# Patient Record
Sex: Female | Born: 1965 | Race: Black or African American | Hispanic: No | Marital: Single | Smoking: Never smoker
Health system: Southern US, Community
[De-identification: ages and names within clinical notes are randomized; demographics above are authoritative.]

## PROBLEM LIST (undated history)

## (undated) DIAGNOSIS — R569 Unspecified convulsions: Secondary | ICD-10-CM

## (undated) DIAGNOSIS — E119 Type 2 diabetes mellitus without complications: Secondary | ICD-10-CM

## (undated) DIAGNOSIS — D849 Immunodeficiency, unspecified: Secondary | ICD-10-CM

## (undated) DIAGNOSIS — I10 Essential (primary) hypertension: Secondary | ICD-10-CM

## (undated) DIAGNOSIS — F70 Mild intellectual disabilities: Secondary | ICD-10-CM

## (undated) HISTORY — DX: Unspecified convulsions: R56.9

---

## 2004-04-25 ENCOUNTER — Emergency Department (HOSPITAL_COMMUNITY): Admission: EM | Admit: 2004-04-25 | Discharge: 2004-04-25 | Payer: Self-pay | Admitting: Emergency Medicine

## 2005-01-10 ENCOUNTER — Ambulatory Visit: Payer: Self-pay | Admitting: Psychiatry

## 2005-01-10 ENCOUNTER — Inpatient Hospital Stay (HOSPITAL_COMMUNITY): Admission: AD | Admit: 2005-01-10 | Discharge: 2005-01-17 | Payer: Self-pay | Admitting: Psychiatry

## 2005-03-09 ENCOUNTER — Ambulatory Visit (HOSPITAL_COMMUNITY): Admission: RE | Admit: 2005-03-09 | Discharge: 2005-03-09 | Payer: Self-pay | Admitting: Internal Medicine

## 2005-05-15 ENCOUNTER — Emergency Department (HOSPITAL_COMMUNITY): Admission: EM | Admit: 2005-05-15 | Discharge: 2005-05-16 | Payer: Self-pay | Admitting: Emergency Medicine

## 2005-08-14 ENCOUNTER — Emergency Department (HOSPITAL_COMMUNITY): Admission: EM | Admit: 2005-08-14 | Discharge: 2005-08-15 | Payer: Self-pay | Admitting: Emergency Medicine

## 2005-08-21 ENCOUNTER — Emergency Department (HOSPITAL_COMMUNITY): Admission: EM | Admit: 2005-08-21 | Discharge: 2005-08-21 | Payer: Self-pay | Admitting: Emergency Medicine

## 2005-08-22 ENCOUNTER — Emergency Department (HOSPITAL_COMMUNITY): Admission: EM | Admit: 2005-08-22 | Discharge: 2005-08-22 | Payer: Self-pay | Admitting: Emergency Medicine

## 2005-08-23 ENCOUNTER — Emergency Department (HOSPITAL_COMMUNITY): Admission: EM | Admit: 2005-08-23 | Discharge: 2005-08-23 | Payer: Self-pay | Admitting: Emergency Medicine

## 2005-08-25 ENCOUNTER — Other Ambulatory Visit: Admission: RE | Admit: 2005-08-25 | Discharge: 2005-08-25 | Payer: Self-pay | Admitting: Obstetrics and Gynecology

## 2005-11-09 ENCOUNTER — Encounter (INDEPENDENT_AMBULATORY_CARE_PROVIDER_SITE_OTHER): Payer: Self-pay | Admitting: Specialist

## 2005-11-09 ENCOUNTER — Inpatient Hospital Stay (HOSPITAL_COMMUNITY): Admission: AD | Admit: 2005-11-09 | Discharge: 2005-11-11 | Payer: Self-pay | Admitting: Obstetrics and Gynecology

## 2006-04-12 ENCOUNTER — Ambulatory Visit (HOSPITAL_COMMUNITY): Admission: RE | Admit: 2006-04-12 | Discharge: 2006-04-12 | Payer: Self-pay | Admitting: Internal Medicine

## 2007-03-29 ENCOUNTER — Emergency Department (HOSPITAL_COMMUNITY): Admission: EM | Admit: 2007-03-29 | Discharge: 2007-03-29 | Payer: Self-pay | Admitting: Emergency Medicine

## 2007-04-15 ENCOUNTER — Ambulatory Visit (HOSPITAL_COMMUNITY): Admission: RE | Admit: 2007-04-15 | Discharge: 2007-04-15 | Payer: Self-pay | Admitting: Internal Medicine

## 2007-10-15 ENCOUNTER — Observation Stay (HOSPITAL_COMMUNITY): Admission: AD | Admit: 2007-10-15 | Discharge: 2007-10-17 | Payer: Self-pay | Admitting: Internal Medicine

## 2007-12-17 ENCOUNTER — Encounter: Admission: RE | Admit: 2007-12-17 | Discharge: 2007-12-17 | Payer: Self-pay | Admitting: Internal Medicine

## 2008-03-05 ENCOUNTER — Other Ambulatory Visit: Payer: Self-pay

## 2008-03-05 ENCOUNTER — Other Ambulatory Visit: Payer: Self-pay | Admitting: Emergency Medicine

## 2008-03-05 ENCOUNTER — Inpatient Hospital Stay (HOSPITAL_COMMUNITY): Admission: RE | Admit: 2008-03-05 | Discharge: 2008-03-09 | Payer: Self-pay | Admitting: Psychiatry

## 2008-03-05 ENCOUNTER — Ambulatory Visit: Payer: Self-pay | Admitting: Psychiatry

## 2008-03-31 ENCOUNTER — Encounter: Admission: RE | Admit: 2008-03-31 | Discharge: 2008-03-31 | Payer: Self-pay | Admitting: Internal Medicine

## 2009-03-10 ENCOUNTER — Encounter: Admission: RE | Admit: 2009-03-10 | Discharge: 2009-03-10 | Payer: Self-pay | Admitting: General Surgery

## 2009-03-25 ENCOUNTER — Encounter (INDEPENDENT_AMBULATORY_CARE_PROVIDER_SITE_OTHER): Payer: Self-pay | Admitting: General Surgery

## 2009-03-25 ENCOUNTER — Ambulatory Visit (HOSPITAL_COMMUNITY): Admission: RE | Admit: 2009-03-25 | Discharge: 2009-03-26 | Payer: Self-pay | Admitting: General Surgery

## 2009-05-18 ENCOUNTER — Emergency Department (HOSPITAL_COMMUNITY): Admission: EM | Admit: 2009-05-18 | Discharge: 2009-05-18 | Payer: Self-pay | Admitting: Emergency Medicine

## 2009-05-31 ENCOUNTER — Ambulatory Visit (HOSPITAL_COMMUNITY): Admission: RE | Admit: 2009-05-31 | Discharge: 2009-05-31 | Payer: Self-pay | Admitting: Internal Medicine

## 2009-06-10 ENCOUNTER — Encounter: Admission: RE | Admit: 2009-06-10 | Discharge: 2009-06-10 | Payer: Self-pay | Admitting: Internal Medicine

## 2009-12-27 ENCOUNTER — Encounter: Admission: RE | Admit: 2009-12-27 | Discharge: 2009-12-27 | Payer: Self-pay | Admitting: Internal Medicine

## 2010-04-27 ENCOUNTER — Emergency Department (HOSPITAL_COMMUNITY): Admission: EM | Admit: 2010-04-27 | Discharge: 2010-04-27 | Payer: Self-pay | Admitting: Emergency Medicine

## 2010-06-08 ENCOUNTER — Ambulatory Visit (HOSPITAL_COMMUNITY)
Admission: RE | Admit: 2010-06-08 | Discharge: 2010-06-08 | Payer: Self-pay | Source: Home / Self Care | Attending: Internal Medicine | Admitting: Internal Medicine

## 2010-08-30 LAB — DIFFERENTIAL
Lymphocytes Relative: 16 % (ref 12–46)
Monocytes Absolute: 0.5 10*3/uL (ref 0.1–1.0)
Monocytes Relative: 5 % (ref 3–12)
Neutro Abs: 7.4 10*3/uL (ref 1.7–7.7)

## 2010-08-30 LAB — URINE MICROSCOPIC-ADD ON

## 2010-08-30 LAB — URINALYSIS, ROUTINE W REFLEX MICROSCOPIC
Bilirubin Urine: NEGATIVE
Glucose, UA: NEGATIVE mg/dL
Ketones, ur: NEGATIVE mg/dL
pH: 6 (ref 5.0–8.0)

## 2010-08-30 LAB — CBC
HCT: 35.6 % — ABNORMAL LOW (ref 36.0–46.0)
Hemoglobin: 12 g/dL (ref 12.0–15.0)
RBC: 3.71 MIL/uL — ABNORMAL LOW (ref 3.87–5.11)
WBC: 9.5 10*3/uL (ref 4.0–10.5)

## 2010-08-30 LAB — POCT I-STAT, CHEM 8
BUN: 9 mg/dL (ref 6–23)
Calcium, Ion: 1.17 mmol/L (ref 1.12–1.32)
Chloride: 105 mEq/L (ref 96–112)
Glucose, Bld: 86 mg/dL (ref 70–99)

## 2010-08-30 LAB — PREGNANCY, URINE: Preg Test, Ur: NEGATIVE

## 2010-09-22 LAB — CBC
HCT: 36 % (ref 36.0–46.0)
Hemoglobin: 12.4 g/dL (ref 12.0–15.0)
MCV: 97.3 fL (ref 78.0–100.0)
Platelets: 216 10*3/uL (ref 150–400)
RDW: 12.6 % (ref 11.5–15.5)
WBC: 8.2 10*3/uL (ref 4.0–10.5)

## 2010-09-22 LAB — COMPREHENSIVE METABOLIC PANEL
Albumin: 3.5 g/dL (ref 3.5–5.2)
BUN: 9 mg/dL (ref 6–23)
Chloride: 104 mEq/L (ref 96–112)
Creatinine, Ser: 0.93 mg/dL (ref 0.4–1.2)
Glucose, Bld: 169 mg/dL — ABNORMAL HIGH (ref 70–99)
Total Bilirubin: 0.5 mg/dL (ref 0.3–1.2)

## 2010-09-22 LAB — GLUCOSE, CAPILLARY
Glucose-Capillary: 106 mg/dL — ABNORMAL HIGH (ref 70–99)
Glucose-Capillary: 136 mg/dL — ABNORMAL HIGH (ref 70–99)
Glucose-Capillary: 156 mg/dL — ABNORMAL HIGH (ref 70–99)
Glucose-Capillary: 97 mg/dL (ref 70–99)

## 2010-09-22 LAB — DIFFERENTIAL
Basophils Relative: 1 % (ref 0–1)
Lymphocytes Relative: 20 % (ref 12–46)
Monocytes Absolute: 0.7 10*3/uL (ref 0.1–1.0)
Neutro Abs: 5.8 10*3/uL (ref 1.7–7.7)
Neutrophils Relative %: 71 % (ref 43–77)

## 2010-11-01 NOTE — H&P (Signed)
NAMEARELLY, WHITTENBERG               ACCOUNT NO.:  [id number]   MEDICAL RECORD NO.:  [id number]          PATIENT TYPE:  INP   LOCATION:  1517                         FACILITY:  [facility]   PHYSICIAN:  Madaline Savage, MD        DATE OF BIRTH:  August 19, 1965   DATE OF ADMISSION:  10/15/2007  DATE OF DISCHARGE:                              HISTORY & PHYSICAL   PRIMARY CARE PHYSICIAN:  Dr. Della Goo.   CHIEF COMPLAINT:  Hyperglycemia.   HISTORY OF PRESENT ILLNESS:  Ms. Solberg is a 45 year old lady with a  history of hypertension, bipolar disorder and depression who was  transferred from her primary care physician's office where she had a  blood sugar of 550.  She was not diagnosed with diabetes mellitus  before, but she states she has had polyuria and polydipsia for the last  few weeks.  She also complains of dysuria now.  She denies any fever or  chills or any other complaints.   PAST MEDICAL HISTORY:  1. Hypertension.  2. Bipolar disorder.  3. Depression.  4. History of genital herpes.   PAST SURGICAL HISTORY:  1. Tubal ligation.  2. She had a laparotomy with cyst removal and myomectomy.   ALLERGIES:  SHE IS ALLERGIC TO PENICILLIN AND SULFA.   CURRENT MEDICATIONS:  1. She is on Risperdal 0.5 mg daily.  2. Sertraline 50 mg daily.  3. Trazodone 50 mg at bedtime.  4. Valtrex 500 mg p.o. daily.   SOCIAL HISTORY:  She lives in a group home.  She has been there for the  last 2 years.  She denies any history of smoking.  She drinks alcohol  occasionally.  Denies any history of drug abuse.   FAMILY HISTORY:  Her father passed away at the age of 73.  He had some  kind of cancer which she is not sure about.  He was a diabetic.  She  does not know the medical history of her mother.   REVIEW OF SYSTEMS:  GENERAL:  She denies any recent weight loss or  weight gain.  No fever or chills.  HEENT: No headaches, no blurred  vision or sore throat.  CARDIOVASCULAR SYSTEM:  Denies chest pain,  no  cough.  No abdominal pain, nausea, vomiting, diarrhea, or constipation.  GENITOURINARY SYSTEM:  She does complain of dysuria and no increased  frequency of urination but she does also complain of polyuria.  EXTREMITIES:  No tingling or numbness of toes.   PHYSICAL EXAM:  She is alert and oriented x3.  VITAL SIGNS:  Temperature is 98.4, pulse rate of 74, blood pressure  105/78.  Respiratory rate 18, oxygen saturation 99% room air.  HEENT:  Head atraumatic, normocephalic.  Pupils bilaterally equal, round  and reactive to light.  Mucous membranes are moist.  NECK:  Supple.  No JVD, no carotid bruit.  CARDIOVASCULAR:  S1 and S2.  Regular rate and rhythm.  CHEST:  Clear to auscultation.  ABDOMEN:  Soft.  Bowel sounds heard.  EXTREMITIES:  No edema, cyanosis or clubbing.   LABS:  Show  a white count of 82, hemoglobin 12, platelets 391.  Her CBG  is 462 here.   IMPRESSION:  1. New-onset diabetes mellitus, likely type 2.  2. Hyperglycemia.  3. Hypertension.  4. Bipolar disorder.  5. History of genital herpes.   PLAN:  This is a 45 year old lady who is admitted with new-onset  diabetes mellitus and hyperglycemia.  At this time her blood sugars are  462.  I will start her on 20 units of Lantus at this time and also put  her on sliding scale insulin.  I suspect her diabetes mellitus to be  likely type 2 diabetes mellitus.  I will start her on p.o. Amaryl at  this time and we can consider Glucophage once we know her renal  function.  I will get a BNP at this time.  I will also order for  diabetic education and dietary consult.  I will put her on DVT  prophylaxis.      Madaline Savage, MD  Electronically Signed     PKN/MEDQ  D:  10/15/2007  T:  10/15/2007  Job:  [id number]

## 2010-11-01 NOTE — Discharge Summary (Signed)
NAME:  [name], [name]               ACCOUNT NO.:  [id number]   MEDICAL RECORD NO.:  [id number]          PATIENT TYPE:  IPS   LOCATION:  0407                          FACILITY:  [facility]   PHYSICIAN:  Anselm Jungling, MD  DATE OF BIRTH:  [birth year]   DATE OF ADMISSION:  03/05/2008  DATE OF DISCHARGE:  03/09/2008                               DISCHARGE SUMMARY   IDENTIFYING DATA/REASON FOR ADMISSION:  The patient is a 45 year old  single African American female who lives at the [facility].  She stated that she referred herself due to increasing  depression, guilt and suicidal thoughts.  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  She described a lot of anger  and rage.  She also described mood swings.  Please refer to the  admission note for further details pertaining to the symptoms,  circumstances and history that led to her hospitalization.  She was  given initial Axis I diagnosis of depressive disorder NOS.   MEDICAL AND LABORATORY:  The patient came to [place] with a history of non-  insulin dependent diabetes mellitus, skin rash and herpes.  In addition,  she had a history of hyperlipidemia.  She was medically and physically  assessed by the psychiatric nurse practitioner.  She was continued on  her usual regimen of Glucophage, Amaryl, Valtrex, and Zocor.  Clotrimazole cream was given twice daily for her rash.  There were no  acute medical issues.   HOSPITAL COURSE:  The patient was admitted to the adult inpatient  psychiatric service.  She presented as a well-nourished, normally-  developed [place] American female who was alert and fully oriented.  She  was depressed and very sad.  She was open, cooperative and pleasant.  Her thoughts appeared to be mildly disorganized.  She indicated that she  did want [place] to be in touch with the coordinator of her group home.   She was continued on a psychotropic regimen of Zoloft 50 mg daily, which  was  subsequently increased to 75 mg daily, with trazodone 50 mg nightly  for sleep.  She was involved in the therapeutic milieu.  She had periods  of agitation and conflict with roommate.  However, she was stable enough  within 4 days to return to her usual group home.  Case management  contacted the coordinator of her group home, and arranged for her return  there.  Other followup plans were made as follows.   AFTERCARE:  The patient was to follow up at the [facility] with an  appointment to see their psychiatrist on March 17, 2008.   DISCHARGE MEDICATIONS:  1. Glucophage 500 mg b.i.d.  2. Clotrimazole cream twice daily to rash.  3. Amaryl 1 mg q.a.m.  4. Trazodone 50 mg nightly.  5. Valtrex 500 mg daily.  6. Zocor 20 mg at bed.  7. Zoloft 75 mg daily.   DISCHARGE DIAGNOSES:  Axis I:  Depressive disorder, NOS.  Axis II: Mild mental retardation.  Axis III: History of diabetes mellitus, hyperlipidemia, herpes simplex,  rash.  Axis IV: Stressors severe.  Axis V: GAF on discharge 55.      Anselm Jungling, MD  Electronically Signed     SPB/MEDQ  D:  03/24/2008  T:  03/24/2008  Job:  [id number]

## 2010-11-01 NOTE — H&P (Signed)
NAMEKATHRYN, [name]               ACCOUNT NO.:  [id number]   MEDICAL RECORD NO.:  [id number]          PATIENT TYPE:  INP   LOCATION:  1517                         [facility]ACILITY:  [facility]   PHYSICIAN:  Della Goo, M.D. DATE OF BIRTH:  28-Aug-1965   DATE OF ADMISSION:  10/15/2007  DATE OF DISCHARGE:                              HISTORY & PHYSICAL   PRIMARY CARE PHYSICIAN:  Dr. Della Goo.   CHIEF COMPLAINT:  Increased urination.   HISTORY OF PRESENT ILLNESS:  This is a 45 year old female who was seen  and evaluated in my office with complaints of dysuria, polyuria, and  increased thirst which she reports having for the past 3-4 weeks.  The  patient also reports losing weight unintentionally.  The patient denies  having any fevers, chills, shortness of breath.  She has been drinking  lots of fluids to try to keep up with her thirst.  The patient lives in  a group home, and her caretaker reports that she has also been lethargic  and sluggish.  There has been no report of fevers, chills.  Her  caretaker also reports that she has nocturia and gets up four to five  times nightly and has done so during the past two to three weeks.  The  patient was seen and evaluated in my office.  A urine dipstick was  performed which revealed 2+ glucose in the urine, and also a fingerstick  glucose was performed, results of which returned at 550.  The patient  was referred for direct admission for further evaluation and treatment  of new type 2 diabetes mellitus most probably.   PAST MEDICAL HISTORY:  1. Mild mental retardation.  2. Depressive disorder.  3. Schizophrenia.  4. Obesity.  5. Dysmenorrhea.  6. Recent diagnosis of genital HSV II.  7. Status post bilateral tubal ligation.   MEDICATIONS:  Include:  1. Sertraline 50 mg 1 p.o. daily.  2. Risperdal 0.5 mg 1 p.o. every night.  3. Trazodone 50 mg 1 p.o. every night.  4. Valtrex 500 mg 1 p.o. daily.   ALLERGIES:  TO PENICILLIN AND  SULFA.   SOCIAL HISTORY:  The patient lives at the [facility].  She  is a nonsmoker, nondrinker.   FAMILY HISTORY:  Unknown.   REVIEW OF SYSTEMS:  The patient does report having nausea.  No vomiting.  No diarrhea.  Other pertinent positives are mentioned above.  She has  had no loss of consciousness or seizures.   PHYSICAL EXAMINATION FINDINGS:  This is a 45 year old obese female in  discomfort but no acute distress.  Her vital signs are temperature 98.4,  blood pressure 105/70, heart rate 74, respirations 18, and her O2  saturation 99% on room air.  HEENT EXAMINATION:  Normocephalic, atraumatic.  Pupils equally round,  reactive to light.  Extraocular muscles are intact.  Funduscopic benign.  Oropharynx is clear.  NECK:  Is supple.  Full range of motion.  No thyromegaly, adenopathy or  jugular venous distention.  CARDIOVASCULAR:  Regular rate and rhythm.  Positive systolic ejection  murmur.  No gallops or  rubs.  LUNGS:  Clear to auscultation bilaterally.  ABDOMEN:  Positive bowel sounds, soft, nontender, nondistended.  EXTREMITIES:  Without cyanosis, clubbing or edema.  GENITOURINARY:  Normal female genitalia.  No lesions. abnormal masses at  this time.  NEUROLOGIC EXAMINATION:  Alert and oriented x3.  Nonfocal.   LABORATORY STUDIES:  Have been ordered.   ASSESSMENT:  A 45 year old female being admitted with:  1. Hyperglycemia.  2. New type 2 diabetes mellitus.  3. Mild dehydration.  4. Dysuria.   PLAN:  The patient will be admitted and treated for her hyperglycemia.  IV fluids have been ordered for rehydration.  The patient will also have  diabetes education and nutritional counseling.  The patient will be  started on diabetic medications which will be further adjusted as an  outpatient when the patient is ready to be discharged.  The patient will  be placed on DVT and GI prophylaxis.      Della Goo, M.D.  Electronically Signed     HJ/MEDQ  D:   10/15/2007  T:  10/15/2007  Job:  [id number]   cc:   Della Goo, M.D.  Fax: [phone]

## 2010-11-01 NOTE — Discharge Summary (Signed)
NAME:  [name], [name]               ACCOUNT NO.:  [id number]   MEDICAL RECORD NO.:  [id number]          PATIENT TYPE:  OBV   LOCATION:  1517                         [facility]ACILITY:  [facility]   PHYSICIAN:  Altha Harm, MDDATE OF BIRTH:  Jul 08, 1965   DATE OF ADMISSION:  10/15/2007  DATE OF DISCHARGE:                               DISCHARGE SUMMARY   DISCHARGE DISPOSITION:  [facility].   DISCHARGE DIAGNOSES:  1. Urinary tract infection pathogens group B strep.  2. Yeast.  3. Diabetes type 2, new diagnosis.  4. Mild mental retardation.  5. Depressive disorder.  6. Schizophrenia.  7. Obesity.  8. Dysmenorrhea.  9. Recent diagnosis of genital Herpes simplex virus 2.  10.Status post bilateral tubal ligation.   DISCHARGE MEDICATIONS:  Include the following:  1. Diflucan 100 mg p.o. daily x6.  2. Ciprofloxacin 500 mg p.o. b.i.d. x2.  3. Risperdal 0.5 mg p.o. daily.  4. Zoloft 50 mg p.o. daily.  5. Trazodone 50 mg p.o. q.h.s.  6. Valtrex 500 mg p.o. daily.  7. Amaryl 1 mg p.o. daily.  8. Glucophage 1000 mg p.o. b.i.d.  9. Insulin, NovoLog, on sliding scale.   CONSULTANTS:  None.   PROCEDURES:  None.   DIAGNOSTIC STUDIES:  None.   RELEVANT LABORATORY STUDIES:  Urine culture which showed greater than  100,000 colonies of group B strep and yeast.   CHIEF COMPLAINT:  Frequent urination.   HISTORY OF PRESENT ILLNESS:  Please see the H&P dictated by Dr. Lovell Sheehan  for details of the HPI.   HOSPITAL COURSE:  New diagnosis diabetes type 2.  The patient was  admitted with findings of hypoglycemia.  She was diagnosed with diabetes  type 2.  Hemoglobin A1c was performed and was found to be elevated at  14.5.  The patient was started on insulin, Amaryl and Glucophage.  Her  blood sugars have been hovering in the 200s to 300s.  The patient will  need further titration as an outpatient.   The patient is being discharged home on these medications.   RECOMMENDATIONS:  1. To  follow up with Dr. Lovell Sheehan in 1 week for further titration and      to keep a blood sugar log, checking blood sugars before meals and      bedtime.  A prescription has been given for a Glucometer in      addition to the patient's medications as listed above.  2. Urinary tract infection.  The patient was found to have a urinary      tract infection.  Cultures revealed the patient had group B strep      and yeast as pathogens, both of which are sensitive to      ciprofloxacin and Diflucan respectively.  The patient is being      discharged home on both ciprofloxacin and Diflucan for the duration      as listed above.  Otherwise the patient remained stable while      hospitalized.   FOLLOW UP:  The patient follow up with Dr. Lovell Sheehan in 1 week.   DIETARY RESTRICTIONS:  She is to be on a diabetic carb-modified diet.   FOLLOW-UP LABS:  None.  The patient should, however, check her blood  sugars a.c. and h.s. and report them to Dr. Lovell Sheehan for further  titration of her medications.      Altha Harm, MD  Electronically Signed     MAM/MEDQ  D:  10/17/2007  T:  10/17/2007  Job:  [id number]   cc:   Della Goo, M.D.  Fax: [phone]

## 2010-11-04 NOTE — Discharge Summary (Signed)
[name], KIDNEY               ACCOUNT NO.:  [id number]   MEDICAL RECORD NO.:  [id number]          PATIENT TYPE:  INP   LOCATION:  9305                          FACILITY:  [facility]   PHYSICIAN:  Janine Limbo, M.D.DATE OF BIRTH:  Oct 18, 1965   DATE OF ADMISSION:  11/09/2005  DATE OF DISCHARGE:  11/11/2005                                 DISCHARGE SUMMARY   DISCHARGE DIAGNOSES:  1.  Abdominal inclusion cyst.  2.  Abdominal pain.  3.  Fibroid uterus.   OPERATION:  On the day of admission, the patient underwent an exploratory  laparotomy with  myomectomy and removal of pelvic inclusion cyst.   The patient was found to have the inclusion cyst in the pelvis.  They were  thin, walled and contained clear fluid.  The overall size of the 3 inclusion  cysts was approximately 10 cm.  Additionally, the patient was found to have  a 5 cm fibroid in the posterior fundus of the uterus.  Fallopian tubes were  normal except for defects from her prior tubal ligation.  Ovaries appeared  normal.  There was an ovulatory cyst on the left ovary.  Uterus was upper  limits of normal size.  Remainder of pelvis was observed to be normal.   HISTORY OF PRESENT ILLNESS:  [name] is a 45 year old female para 0-1-0-1  who presented for exploratory laparotomy because of abdominal pain and a  left adnexal mass.  Please see the patient's dictated HISTORY AND PHYSICAL  EXAMINATION for details.   PREOPERATIVE PHYSICAL EXAM:  The patient's vital signs were stable and she  was afebrile.  GENERAL EXAM: Within  normal limits; though note, on her  abdominal exam, the patient was minimally tender in both lower quadrants.  PELVIC EXAM: External genitalia was normal.  Vagina was normal.  Cervix was  nontender.  The uterus was upper limits of normal size and generally tender  to exam.  The adnexa showed a cystic mass in the left adnexa that was also  tender on examination.   HOSPITAL COURSE:  On the day of admission  the patient underwent  aforementioned procedures tolerating them all well.  The patient's  postoperative course was unremarkable with patient resuming bowel and  bladder function by postoperative day #2 and therefore deemed ready for  discharge home.  Postoperative hemoglobin 11.2 (preoperative hemoglobin  13.1).   DISCHARGE MEDICATIONS:  The patient was to resume her:  1.  Zoloft 1 tablet at bedtime.  2.  Trazodone 1 tablet bedtime.  3.  Vicodin 1 tablet every 6 hours as needed for pain.  4.  Risperdal 1 tablet at bedtime.   FOLLOW UP:  The patient is to call [facility] at [phone] to  schedule a postoperative visit in 4-6 weeks.   DISCHARGE INSTRUCTIONS:  The patient was advised to avoid driving for 2  weeks, heavy lifting for 6 weeks (no more than 10 pounds), intercourse for 6  weeks, to increase her activities slowly, that she may shower, that she may  walk up steps.  The patient was also advised to call  Dr. Stefano Gaul for any  temperature greater than or equal to 100.4 degrees Fahrenheit orally, any  increased pain which is not relieved by her pain medication, any excessive  vaginal bleeding.  Additionally, the patient was given a copy of the  instruction packet entitled: Recovering From Your Surgery.  The patient's  diet was without restriction.   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      [place] J. Adline Peals.      Janine Limbo, M.D.  Electronically Signed    EJP/MEDQ  D:  11/29/2005  T:  11/29/2005  Job:  [id number]
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
## 2010-11-04 NOTE — Op Note (Signed)
[name], [name]               ACCOUNT NO.:  [id number]   MEDICAL RECORD NO.:  [id number]          PATIENT TYPE:  INP   LOCATION:  9313                          [facility]:  [facility]   PHYSICIAN:  Janine Limbo, M.D.DATE OF BIRTH:  1966-01-02   DATE OF PROCEDURE:  11/09/2005  DATE OF DISCHARGE:                                 OPERATIVE REPORT   PREOPERATIVE DIAGNOSES:  1.  An 11 cm left adnexal mass (questionable ovarian torsion).  2.  Abdominal pain.  3.  Fibroid uterus.   POSTOPERATIVE DIAGNOSES:  1.  Abdominal inclusion cysts.  2.  Abdominal pain.  3.  Fibroid uterus.   PROCEDURES:  1.  Exploratory laparotomy.  2.  Removal of inclusion cysts.  3.  Myomectomy.   SURGEON:  Janine Limbo, M.D.   FIRST ASSISTANT:  Marie L. Williams, C.N.M.   ANESTHETIC:  General.   DISPOSITION:  [name] is a 45 year old female, para 0-1-0-1, who presented  to the office today complaining of abdominal pain.  The patient has a known  fibroid uterus.  The patient had prior ultrasound which showed a  hydrosalpinx.  Because of the patient's abdominal pain, her ultrasound was  repeated today and she was found to have a 11 cm cystic structure in the  left adnexa.  The ovary seemed to be involved and there was a question of  ovarian torsion.  Because of the patient's abdominal pain and because of the  size of the mass, the decision was made to proceed with exploratory  laparotomy.  The patient understood the indications for her surgical  procedure and she accepted the risks, but not limited to, anesthetic  complications, bleeding, infection, and possible damage to surrounding  organs.   FINDINGS:  The patient was found to have three inclusion cysts in the  pelvis.  These were thin-walled and they contained clear fluid.  The overall  size of the three inclusion cysts was approximately 10 cm.  In addition, the  patient was found to have a 5 cm fibroid on the posterior fundus of the  uterus.  The fallopian tubes were normal except for defects from her prior  tubal ligation.  The ovaries appeared normal bilaterally.  There was a  ovulatory cyst on the left ovary.  The uterus was upper limits normal size.  We did explore the abdomen and the bowel appeared normal.  The bifurcation  of the aorta was normal to palpation and there was no evidence of  adenopathy.  The peritoneal surfaces were smooth.  The liver edge was smooth  to palpation.   PROCEDURE:  The patient was taken to the operating room where a general  anesthetic was given.  The patient's abdomen, perineum, and vagina were  prepped with multiple layers of Betadine.  A Foley catheter was placed in  the bladder.  The patient was then examined under anesthesia.  The patient  was sterilely draped.  The lower abdomen was injected with 10 mL of 0.5%  Marcaine with epinephrine.  A low transverse incision was made in the  abdomen and carried sharply  through the subcutaneous tissue, the fascia, and  the anterior peritoneum.  The abdominal contents were explored with findings  as mentioned above.  The inclusion cysts in the abdomen were not attached to  other structures and they were removed without difficulty.  An abdominal  wall retractor was placed.  The bowel was packed cephalad.  The uterus was  elevated into our operative field.  The fibroid was noted on the posterior  surface of the uterus.  After we were comfortable that all other structures  appeared normal (as mentioned above), we then proceeded to make an incision  in the serosa of the uterus and the fibroid was removed using a combination  of sharp and blunt dissection.  The defect in the uterus was closed using  figure-of-eight sutures of 0 Vicryl.  The serosa of the uterus was closed  using a running suture of 3-0 Vicryl.  Hemostasis was adequate.  Intercede  was placed over the incision in the uterus and sutured into place.  The  pelvis was then vigorously  irrigated.  Hemostasis was once again confirmed.  All instruments were removed at this point.  The anterior peritoneum was  closed using a running suture of 3-0 Vicryl.  The fascia was closed using a  running suture of 0 Vicryl followed by three interrupted sutures of 0  Vicryl.  The subcutaneous layer was closed using 0 Vicryl.  The skin was  reapproximated using a subcuticular suture of 3-0 Monocryl.  Sponge, needle  and instrument counts were correct on two occasions.  The estimated blood  loss for the procedure was 30 mL.  The patient tolerated her procedure well.  She was awakened from her anesthetic and taken to the recovery room in  stable condition.  She was noted to drain clear yellow urine at the end of  her procedure.      Janine Limbo, M.D.  Electronically Signed     AVS/MEDQ  D:  11/09/2005  T:  11/10/2005  Job:  [id number]   cc:   [name], M.D.  Fax: [phone]

## 2010-11-04 NOTE — H&P (Signed)
NAMEJANEENE, [name]               ACCOUNT NO.:  [id number]   MEDICAL RECORD NO.:  [id number]          PATIENT TYPE:  INP   LOCATION:  9313                          [facility]ILITY:  [facility]   PHYSICIAN:  Janine Limbo, M.D.DATE OF BIRTH:  [birth year]   DATE OF ADMISSION:  11/09/2005  DATE OF DISCHARGE:                                HISTORY & PHYSICAL   HISTORY OF PRESENT ILLNESS:  Ms. [name] is a 45 year old female, para 0-1-0-  1, who presents for exploratory laparotomy because of abdominal pain and  because of a left adnexal mass.  The patient has been followed at the  [facility].  She  presented to the office today complaining of increased abdominal pain.  The  patient has been evaluated in the past for abdominal pain and she was found  to have a cystic structure in the left adnexa that was thought to represent  a left hydrosalpinx.  The patient was treated with pain medication and  follow-up evaluation was scheduled.  The patient presented today, however,  complaining of increased abdominal pain.  An ultrasound was performed in the  office today and it showed an 11 cm left adnexal mass that was thought to be  consistent with torsion of the left ovary.  The the patient was sent to the  hospital for further evaluation and for surgery.  The patient has had a  prior tubal ligation in the past.  She denies other GYN surgery.   OBSTETRICAL HISTORY:  The patient has had one 35-week vaginal delivery.  The  infant weighed approximately 5 pounds.   DRUG ALLERGIES:  PENICILLIN.   PAST MEDICAL HISTORY:  1.  The patient has hypertension.  2.  She also has bipolar disease.   SOCIAL HISTORY:  The patient denies cigarette use, alcohol use, and  recreational drug use.   CURRENT MEDICATIONS:  1.  Risperdal 1 p.o. q.h.s.  2.  Zoloft 1 q.h.s.  3.  Trazodone 1 p.o. q.h.s.  4.  Vicodin 1 tablet every 6 hours as needed for pain.   REVIEW OF  SYSTEMS:  Please see history of present illness.   FAMILY HISTORY:  Noncontributory.   PHYSICAL EXAM:  The patient is afebrile and her vital signs are stable.  HEENT is within normal limits except for the patient does have a slow affect  and she is slow to respond.  Chest is clear.  Heart regular rate and rhythm.  Her abdomen is soft and minimally tender in the lower quadrants.  Her  extremities are grossly normal and her neurologic exam is grossly normal  except for again the fact the patient is slow to respond to questions.  Pelvic exam external genitalia is normal.  Vagina is normal.  Cervix is  nontender.  The uterus is upper limits normal size and there is generalized  tenderness to examination.  The adnexa shows a cystic mass in the left  adnexa that is tender to examination.   LABORATORY VALUES:  Ultrasound was performed today and it showed an 11-1/2  cm left  adnexal mass consistent with torsion.  White blood cell count is  10,500, hemoglobin is 13.1, hematocrit is 39%, platelet count is 379,000.   ASSESSMENT:  1.  An 11 cm left adnexal mass consistent with ovarian torsion.  2.  Abdominal pain.  3.  Fibroid uterus.   PLAN:  The patient will undergo exploratory laparotomy.  Because of the size  of the mass, we do not feel that laparoscopy is most appropriate.  The  patient understands that there is the possibility that if she has adhesive  disease we may need to proceed with hysterectomy.  There is also a small  possibility that we may need to proceed with bilateral salpingo-  oophorectomy.  We discussed the risks of this procedure including, but not  limited to, anesthetic complications, bleeding, infection, and possible  damage to surrounding organs.      Janine Limbo, M.D.  Electronically Signed     AVS/MEDQ  D:  11/09/2005  T:  11/09/2005  Job:  [id number]   cc:   Naima A. Normand Sloop, M.D.  Fax: [phone]

## 2010-11-04 NOTE — Discharge Summary (Signed)
NAME:  [name], [name] NO.:  [id number]   MEDICAL RECORD NO.:  [id number]          PATIENT TYPE:  IPS   LOCATION:  0402                          [facility]ACILITY:  [facility]   PHYSICIAN:  Anselm Jungling, MD  DATE OF BIRTH:  1966/03/02   DATE OF ADMISSION:  01/10/2005  DATE OF DISCHARGE:  01/17/2005                                 DISCHARGE SUMMARY   IDENTIFYING DATA AND REASON FOR ADMISSION:  This was the first [facility] admission  for [name], a 45 year old single African-American female who was admitted  on a voluntary basis due to increasing depression, irritability, and  homicidal ideation towards her sister.  She came to [place] with previous  psychiatric history consisting of an inpatient admission at [facility] in November of 2005.  Also, she had been receiving outpatient  services at the [facility] during the Fall of 2005 and  early 2006, but she had been noncompliant with her appointments and with  medication.  She had been prescribed Zoloft 50 mg daily for the 3 weeks  prior to admission, but was taking these inconsistently.  She had no history  of alcohol or substance abuse.  Please refer to the admission note for  further details pertaining to the symptoms, circumstances and history that  led to her hospitalization at [facility].   INITIAL DIAGNOSTIC IMPRESSION:  AXIS I:  Depressive disorder not otherwise  specified, and history of mild mental retardation.  AXIS II:  Deferred.  AXIS III:  No known drug allergies.  AXIS IV:  Stressors, severe.  AXIS V:  Global assessment of functioning 30.   MEDICAL AND LABORATORY:  The patient was medically cleared at the [facility]  Emergency Department prior to transfer to the inpatient psychiatric service.  CBC was notable for reduced RBCs at 3.69.  Hemoglobin and hematocrit were  reduced at 11.6 and 34 respectively.  A TSH was within normal limits.  A PPD  test for tuberculosis was negative.   HOSPITAL  COURSE:  The patient presented as a thin African-American female  who was disheveled, but cooperative during her inpatient stay.  Her thoughts  and speech appeared to be somewhat disorganized.  She did not appear to be  overtly delusional and did not admit to any auditory hallucinations.  She  did talk a great deal of her resentment and murderous impulses towards her  sister, who she felt disrespected her and belittled her.   She was continued on her trial of Zoloft 50 mg daily.  The following  medications were provided on a p.r.n. basis:  Ambien and trazodone for  sleep, and Ativan and Risperdal for agitation.   By the third hospital day, the patient was stating that she still wanted to  kill her sister.  At this point we were pretty sure that she was not  exhibiting any signs or symptoms of psychosis or delusionality.  The patient  agreed with [place] that given her feelings towards her sister, that it was best  for her not to return to that home.   AFTERCARE:  The  patient was ultimately discharged to the [facility].  She was picked up by their staff.  She was to follow up at the [facility], with an appointment on January 19, 2005 at 2 p.m. with Dr. Lang Snow.   DISCHARGE MEDICATIONS:  Zoloft 50 mg daily.   DISCHARGE DIAGNOSES:  AXIS I:  Depressive disorder not otherwise specified,  and mild mental retardation.  AXIS II:  No diagnosis.  AXIS III:  No known drug allergies.  AXIS IV:  Stressors, severe.  AXIS V:  Global assessment of functioning on discharge 55.           ______________________________  Anselm Jungling, MD  Electronically Signed     SPB/MEDQ  D:  02/06/2005  T:  02/06/2005  Job:  [id number]

## 2011-03-07 ENCOUNTER — Other Ambulatory Visit: Payer: Self-pay

## 2011-03-07 DIAGNOSIS — Z124 Encounter for screening for malignant neoplasm of cervix: Secondary | ICD-10-CM | POA: Insufficient documentation

## 2011-03-07 DIAGNOSIS — R87810 Cervical high risk human papillomavirus (HPV) DNA test positive: Secondary | ICD-10-CM | POA: Insufficient documentation

## 2011-03-07 DIAGNOSIS — Z113 Encounter for screening for infections with a predominantly sexual mode of transmission: Secondary | ICD-10-CM | POA: Insufficient documentation

## 2011-03-10 ENCOUNTER — Other Ambulatory Visit (HOSPITAL_COMMUNITY)
Admission: RE | Admit: 2011-03-10 | Discharge: 2011-03-10 | Disposition: A | Discharge status: Home / Self Care | Payer: Medicaid Other | Source: Ambulatory Visit | Attending: Obstetrics and Gynecology | Admitting: Obstetrics and Gynecology

## 2011-03-14 LAB — BASIC METABOLIC PANEL
CO2: 28
Calcium: 8.4
Chloride: 107
GFR calc Af Amer: >60
Sodium: 138

## 2011-03-14 LAB — CBC
HCT: 35.9 — ABNORMAL LOW
Hemoglobin: 11.6 — ABNORMAL LOW
MCHC: 33.4
Platelets: 317
RBC: 3.82 — ABNORMAL LOW
RDW: 13.1
WBC: 7.2

## 2011-03-14 LAB — URINE MICROSCOPIC-ADD ON

## 2011-03-14 LAB — DIFFERENTIAL
Basophils Relative: 0
Lymphocytes Relative: 26
Lymphs Abs: 2.1
Lymphs Abs: 2.4
Monocytes Absolute: 0.4
Monocytes Absolute: 0.6
Monocytes Relative: 5
Monocytes Relative: 8
Neutro Abs: 4.1
Neutro Abs: 5.6

## 2011-03-14 LAB — URINE CULTURE
Colony Count: >=100000
Special Requests: POSITIVE

## 2011-03-14 LAB — COMPREHENSIVE METABOLIC PANEL
Albumin: 3.7
Alkaline Phosphatase: 74
BUN: 7
Creatinine, Ser: 0.76
Potassium: 3.8
Total Protein: 7.2

## 2011-03-14 LAB — URINALYSIS, ROUTINE W REFLEX MICROSCOPIC
Bilirubin Urine: NEGATIVE
Hgb urine dipstick: NEGATIVE
Protein, ur: NEGATIVE
Urobilinogen, UA: 1

## 2011-03-14 LAB — TSH: TSH: 1.548

## 2011-03-20 LAB — URINALYSIS, ROUTINE W REFLEX MICROSCOPIC
Bilirubin Urine: NEGATIVE
Ketones, ur: NEGATIVE
Nitrite: NEGATIVE
Protein, ur: NEGATIVE
Urobilinogen, UA: 1
pH: 5.5

## 2011-03-20 LAB — CBC
HCT: 39.6
MCHC: 32.8
MCV: 97.3
Platelets: 339
RDW: 13.7

## 2011-03-20 LAB — DIFFERENTIAL
Basophils Absolute: 0
Eosinophils Relative: 1
Lymphocytes Relative: 22
Lymphs Abs: 2.2
Monocytes Absolute: 0.6
Neutro Abs: 7

## 2011-03-20 LAB — GLUCOSE, CAPILLARY
Glucose-Capillary: 104 — ABNORMAL HIGH
Glucose-Capillary: 107 — ABNORMAL HIGH
Glucose-Capillary: 113 — ABNORMAL HIGH
Glucose-Capillary: 117 — ABNORMAL HIGH
Glucose-Capillary: 122 — ABNORMAL HIGH
Glucose-Capillary: 127 — ABNORMAL HIGH
Glucose-Capillary: 143 — ABNORMAL HIGH
Glucose-Capillary: 144 — ABNORMAL HIGH
Glucose-Capillary: 154 — ABNORMAL HIGH
Glucose-Capillary: 86
Glucose-Capillary: 94
Glucose-Capillary: 99

## 2011-03-20 LAB — COMPREHENSIVE METABOLIC PANEL
AST: 35
Albumin: 4.2
BUN: 12
Calcium: 9.6
Creatinine, Ser: 0.88
GFR calc Af Amer: >60
Total Bilirubin: 0.6

## 2011-03-20 LAB — HCG, SERUM, QUALITATIVE: Preg, Serum: NEGATIVE

## 2011-03-20 LAB — RAPID URINE DRUG SCREEN, HOSP PERFORMED
Amphetamines: NOT DETECTED
Benzodiazepines: NOT DETECTED
Cocaine: NOT DETECTED
Tetrahydrocannabinol: NOT DETECTED

## 2011-03-27 ENCOUNTER — Other Ambulatory Visit: Payer: Self-pay | Admitting: Obstetrics and Gynecology

## 2011-03-30 LAB — DIFFERENTIAL
Basophils Absolute: 0.3 — ABNORMAL HIGH
Eosinophils Absolute: 0.1
Eosinophils Relative: 1
Lymphocytes Relative: 22
Lymphs Abs: 1.7
Neutrophils Relative %: 66

## 2011-03-30 LAB — RAPID URINE DRUG SCREEN, HOSP PERFORMED
Amphetamines: NOT DETECTED
Barbiturates: NOT DETECTED
Benzodiazepines: NOT DETECTED
Cocaine: NOT DETECTED
Opiates: NOT DETECTED
Tetrahydrocannabinol: NOT DETECTED

## 2011-03-30 LAB — CBC
HCT: 36.7
Platelets: 346
RDW: 12.9
WBC: 8.1

## 2011-03-30 LAB — URINALYSIS, ROUTINE W REFLEX MICROSCOPIC
Bilirubin Urine: NEGATIVE
Glucose, UA: NEGATIVE
Hgb urine dipstick: NEGATIVE
Ketones, ur: NEGATIVE
Protein, ur: NEGATIVE
Urobilinogen, UA: 0.2

## 2011-03-30 LAB — BASIC METABOLIC PANEL
BUN: 6
Creatinine, Ser: 0.9
GFR calc non Af Amer: >60
Glucose, Bld: 146 — ABNORMAL HIGH

## 2011-03-30 LAB — URINE MICROSCOPIC-ADD ON

## 2011-06-06 ENCOUNTER — Other Ambulatory Visit (HOSPITAL_COMMUNITY): Payer: Self-pay | Admitting: Internal Medicine

## 2011-06-06 DIAGNOSIS — Z1231 Encounter for screening mammogram for malignant neoplasm of breast: Secondary | ICD-10-CM

## 2011-07-11 ENCOUNTER — Ambulatory Visit (HOSPITAL_COMMUNITY): Payer: Medicaid Other | Attending: Internal Medicine

## 2011-08-02 ENCOUNTER — Other Ambulatory Visit (HOSPITAL_COMMUNITY): Payer: Self-pay | Admitting: Internal Medicine

## 2011-08-02 DIAGNOSIS — Z1231 Encounter for screening mammogram for malignant neoplasm of breast: Secondary | ICD-10-CM

## 2011-08-04 ENCOUNTER — Ambulatory Visit (HOSPITAL_COMMUNITY)
Admission: RE | Admit: 2011-08-04 | Discharge: 2011-08-04 | Disposition: A | Discharge status: Home / Self Care | Payer: Medicaid Other | Source: Ambulatory Visit | Attending: Internal Medicine | Admitting: Internal Medicine

## 2011-08-04 DIAGNOSIS — Z1231 Encounter for screening mammogram for malignant neoplasm of breast: Secondary | ICD-10-CM | POA: Insufficient documentation

## 2011-10-10 ENCOUNTER — Other Ambulatory Visit (HOSPITAL_COMMUNITY)
Admission: RE | Admit: 2011-10-10 | Discharge: 2011-10-10 | Disposition: A | Discharge status: Home / Self Care | Payer: Medicaid Other | Source: Ambulatory Visit | Attending: Obstetrics and Gynecology | Admitting: Obstetrics and Gynecology

## 2011-10-10 ENCOUNTER — Other Ambulatory Visit: Payer: Self-pay | Admitting: Obstetrics and Gynecology

## 2011-10-10 DIAGNOSIS — Z124 Encounter for screening for malignant neoplasm of cervix: Secondary | ICD-10-CM | POA: Insufficient documentation

## 2012-08-28 ENCOUNTER — Emergency Department (HOSPITAL_BASED_OUTPATIENT_CLINIC_OR_DEPARTMENT_OTHER)
Admission: EM | Admit: 2012-08-28 | Discharge: 2012-08-28 | Disposition: A | Discharge status: Home / Self Care | Payer: Medicaid Other | Attending: Emergency Medicine | Admitting: Emergency Medicine

## 2012-08-28 ENCOUNTER — Emergency Department (HOSPITAL_BASED_OUTPATIENT_CLINIC_OR_DEPARTMENT_OTHER): Payer: Medicaid Other

## 2012-08-28 ENCOUNTER — Encounter (HOSPITAL_BASED_OUTPATIENT_CLINIC_OR_DEPARTMENT_OTHER): Payer: Self-pay | Admitting: *Deleted

## 2012-08-28 DIAGNOSIS — Z8639 Personal history of other endocrine, nutritional and metabolic disease: Secondary | ICD-10-CM | POA: Insufficient documentation

## 2012-08-28 DIAGNOSIS — Z862 Personal history of diseases of the blood and blood-forming organs and certain disorders involving the immune mechanism: Secondary | ICD-10-CM | POA: Insufficient documentation

## 2012-08-28 DIAGNOSIS — Y9289 Other specified places as the place of occurrence of the external cause: Secondary | ICD-10-CM | POA: Insufficient documentation

## 2012-08-28 DIAGNOSIS — Y9301 Activity, walking, marching and hiking: Secondary | ICD-10-CM | POA: Insufficient documentation

## 2012-08-28 DIAGNOSIS — S8990XA Unspecified injury of unspecified lower leg, initial encounter: Secondary | ICD-10-CM | POA: Insufficient documentation

## 2012-08-28 DIAGNOSIS — I10 Essential (primary) hypertension: Secondary | ICD-10-CM | POA: Insufficient documentation

## 2012-08-28 DIAGNOSIS — M25561 Pain in right knee: Secondary | ICD-10-CM

## 2012-08-28 DIAGNOSIS — Z79899 Other long term (current) drug therapy: Secondary | ICD-10-CM | POA: Insufficient documentation

## 2012-08-28 DIAGNOSIS — W010XXA Fall on same level from slipping, tripping and stumbling without subsequent striking against object, initial encounter: Secondary | ICD-10-CM | POA: Insufficient documentation

## 2012-08-28 HISTORY — DX: Essential (primary) hypertension: I10

## 2012-08-28 HISTORY — DX: Immunodeficiency, unspecified: D84.9

## 2012-08-28 NOTE — ED Provider Notes (Signed)
History     CSN: [id number]  Arrival date & time 08/28/12  1443   First MD Initiated Contact with Patient 08/28/12 1632      Chief Complaint  Patient presents with  . Knee Injury    (Consider location/radiation/quality/duration/timing/severity/associated sxs/prior treatment) HPI Comments: Patient is a 47 year old female who presents with bilateral knee pain after walking down a grassy hill, slipping and falling forward. Patient states primary impact was to her knees. Patient describes the pain in her knees it as an ache which worsens with movement and is relieved by rest. She denies hitting her head and loss of consciousness. Patient further denies vision changes, chest pain, shortness of breath, nausea, vomiting, diarrhea, abdominal pain, saddle anesthesia, and numbness or tingling in her lower extremities. Patient lives in a group home and her primary care doctor is Dr. Lovell Sheehan.  The history is provided by the patient. No language interpreter was used.    Past Medical History  Diagnosis Date  . Hypertension   . Immune deficiency disorder     History reviewed. No pertinent past surgical history.  No family history on file.  History  Substance Use Topics  . Smoking status: Never Smoker   . Smokeless tobacco: Not on file  . Alcohol Use: No    OB History   Grav Para Term Preterm Abortions TAB SAB Ect Mult Living                  Review of Systems  Constitutional: Negative for fever and chills.  Eyes: Negative for visual disturbance.  Respiratory: Negative for shortness of breath.   Cardiovascular: Negative for chest pain.  Gastrointestinal: Negative for nausea, vomiting, abdominal pain and diarrhea.  Genitourinary: Negative for dysuria and hematuria.  Musculoskeletal: Negative for back pain.       + b/l knee pain  Skin: Positive for wound.  All other systems reviewed and are negative.    Allergies  Penicillins; Sulfa antibiotics; and Tomato  Home Medications    Current Outpatient Rx  Name  Route  Sig  Dispense  Refill  . divalproex (DEPAKOTE) 125 MG DR tablet   Oral   Take 125 mg by mouth 3 (three) times daily.         . sertraline (ZOLOFT) 100 MG tablet   Oral   Take 100 mg by mouth daily.         . traZODone (DESYREL) 100 MG tablet   Oral   Take 100 mg by mouth at bedtime.           BP 125/78  Pulse 102  Temp(Src) 97.7 F (36.5 C) (Oral)  Resp 18  Ht 5\' 1"  (1.549 m)  Wt 183 lb (83.008 kg)  BMI 34.6 kg/m2  SpO2 98%  Physical Exam  Nursing note and vitals reviewed. Constitutional: She is oriented to person, place, and time. She appears well-developed. No distress.  obese  HENT:  Head: Normocephalic and atraumatic.  Eyes: Conjunctivae are normal. Pupils are equal, round, and reactive to light. No scleral icterus.  Neck: Normal range of motion. Neck supple.  Cardiovascular: Normal rate, regular rhythm, normal heart sounds and intact distal pulses.   DP and PT pulses 2+  Pulmonary/Chest: Effort normal and breath sounds normal. No respiratory distress. She has no wheezes. She has no rales.  Musculoskeletal:       Right hip: Normal.       Left hip: Normal.       Right knee: She exhibits  normal range of motion, no swelling, no LCL laxity and no MCL laxity. Tenderness found.       Left knee: She exhibits normal range of motion, no swelling, no effusion, no erythema, no LCL laxity and no MCL laxity. Tenderness found.       Right ankle: Normal.       Left ankle: Normal.       Lumbar back: Normal.  Range of motion of bilateral knees normal. No laxity appreciated b/l. Superficial abrasions noted near inferior patella on bilateral knees.  Lymphadenopathy:    She has no cervical adenopathy.  Neurological: She is alert and oriented to person, place, and time.  Follow simple commands and answers questions appropriately  Skin: Skin is warm and dry. She is not diaphoretic. No erythema.  Psychiatric: She has a normal mood and  affect. Her behavior is normal.    ED Course  Procedures (including critical care time)  Labs Reviewed - No data to display No results found.   1. Knee pain, bilateral      MDM  Patient is a 47 year old female who presents with bilateral knee pain after walking down a grassy hill, slipping and falling forward. Superficial abrasions appreciated inferior to the patella of b/l knees; patient exhibits full ROM on exam without laxity. Xray of b/l knees negative for fracture or dislocation. Patient well and nontoxic appearing. Will d/c home with RICE instructions and PCP follow up. Patient seen also by Dr. Manus Gunning with whom this work up and management plan was discussed.        Antony Madura, PA-C 08/30/12 1950
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
## 2012-08-31 NOTE — ED Provider Notes (Signed)
Medical screening examination/treatment/procedure(s) were conducted as a shared visit with non-physician practitioner(s) and myself.  I personally evaluated the patient during the encounter  Knee abrasions after fall. No head injury  Glynn Octave, MD 08/31/12 [phone]

## 2013-02-10 ENCOUNTER — Encounter (HOSPITAL_COMMUNITY): Payer: Self-pay | Admitting: Emergency Medicine

## 2013-02-10 ENCOUNTER — Emergency Department (HOSPITAL_COMMUNITY)
Admission: EM | Admit: 2013-02-10 | Discharge: 2013-02-10 | Disposition: A | Discharge status: Home / Self Care | Payer: Medicaid Other | Attending: Emergency Medicine | Admitting: Emergency Medicine

## 2013-02-10 ENCOUNTER — Emergency Department (HOSPITAL_COMMUNITY): Payer: Medicaid Other

## 2013-02-10 DIAGNOSIS — Z79899 Other long term (current) drug therapy: Secondary | ICD-10-CM | POA: Insufficient documentation

## 2013-02-10 DIAGNOSIS — S139XXA Sprain of joints and ligaments of unspecified parts of neck, initial encounter: Secondary | ICD-10-CM | POA: Insufficient documentation

## 2013-02-10 DIAGNOSIS — Y9389 Activity, other specified: Secondary | ICD-10-CM | POA: Insufficient documentation

## 2013-02-10 DIAGNOSIS — I10 Essential (primary) hypertension: Secondary | ICD-10-CM | POA: Insufficient documentation

## 2013-02-10 DIAGNOSIS — M359 Systemic involvement of connective tissue, unspecified: Secondary | ICD-10-CM | POA: Insufficient documentation

## 2013-02-10 DIAGNOSIS — Z880 Allergy status to penicillin: Secondary | ICD-10-CM | POA: Insufficient documentation

## 2013-02-10 DIAGNOSIS — E119 Type 2 diabetes mellitus without complications: Secondary | ICD-10-CM | POA: Insufficient documentation

## 2013-02-10 DIAGNOSIS — Y92410 Unspecified street and highway as the place of occurrence of the external cause: Secondary | ICD-10-CM | POA: Insufficient documentation

## 2013-02-10 DIAGNOSIS — S161XXA Strain of muscle, fascia and tendon at neck level, initial encounter: Secondary | ICD-10-CM

## 2013-02-10 HISTORY — DX: Type 2 diabetes mellitus without complications: E11.9

## 2013-02-10 LAB — POCT I-STAT, CHEM 8
BUN: 9 mg/dL (ref 6–23)
Calcium, Ion: 1.2 mmol/L (ref 1.12–1.23)
Creatinine, Ser: 0.9 mg/dL (ref 0.50–1.10)
Hemoglobin: 14.3 g/dL (ref 12.0–15.0)
TCO2: 28 mmol/L (ref 0–100)

## 2013-02-10 MED ORDER — IBUPROFEN 600 MG PO TABS: 600.0000 mg | ORAL_TABLET | Freq: Four times a day (QID) | ORAL | Status: DC | PRN

## 2013-02-10 NOTE — ED Provider Notes (Signed)
Medical screening examination/treatment/procedure(s) were performed by non-physician practitioner and as supervising physician I was immediately available for consultation/collaboration.   Loren Racer, MD 02/10/13 [phone]
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
## 2013-02-23 ENCOUNTER — Emergency Department (HOSPITAL_COMMUNITY)
Admission: EM | Admit: 2013-02-23 | Discharge: 2013-02-23 | Disposition: A | Discharge status: Home / Self Care | Payer: Medicaid Other | Attending: Emergency Medicine | Admitting: Emergency Medicine

## 2013-02-23 ENCOUNTER — Encounter (HOSPITAL_COMMUNITY): Payer: Self-pay | Admitting: Emergency Medicine

## 2013-02-23 DIAGNOSIS — Z794 Long term (current) use of insulin: Secondary | ICD-10-CM | POA: Insufficient documentation

## 2013-02-23 DIAGNOSIS — Z79899 Other long term (current) drug therapy: Secondary | ICD-10-CM | POA: Insufficient documentation

## 2013-02-23 DIAGNOSIS — F4324 Adjustment disorder with disturbance of conduct: Secondary | ICD-10-CM | POA: Insufficient documentation

## 2013-02-23 DIAGNOSIS — Z862 Personal history of diseases of the blood and blood-forming organs and certain disorders involving the immune mechanism: Secondary | ICD-10-CM | POA: Insufficient documentation

## 2013-02-23 DIAGNOSIS — E119 Type 2 diabetes mellitus without complications: Secondary | ICD-10-CM | POA: Insufficient documentation

## 2013-02-23 DIAGNOSIS — Z880 Allergy status to penicillin: Secondary | ICD-10-CM | POA: Insufficient documentation

## 2013-02-23 DIAGNOSIS — I10 Essential (primary) hypertension: Secondary | ICD-10-CM | POA: Insufficient documentation

## 2013-02-23 DIAGNOSIS — Z8639 Personal history of other endocrine, nutritional and metabolic disease: Secondary | ICD-10-CM | POA: Insufficient documentation

## 2013-02-23 HISTORY — DX: Mild intellectual disabilities: F70

## 2013-02-23 MED ORDER — ALPRAZOLAM 0.5 MG PO TABS
0.5000 mg | ORAL_TABLET | Freq: Once | ORAL | Status: AC
  Administered 2013-02-23: 0.5 mg via ORAL
  Filled 2013-02-23: qty 1
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
## 2013-02-23 NOTE — ED Notes (Addendum)
Called [facility]. Talked with [name] who states she is sending someone to pick her up. [phone]
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
## 2013-02-23 NOTE — ED Notes (Signed)
Pt observed walking with visitors in the hallway with no difficulty.

## 2013-02-23 NOTE — ED Notes (Signed)
MD at bedside. Psych
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
## 2013-02-26 NOTE — ED Provider Notes (Signed)
Medical screening examination/treatment/procedure(s) were performed by non-physician practitioner and as supervising physician I was immediately available for consultation/collaboration.  Hawa Henly R. Jermon Chalfant, MD 02/26/13 1532 

## 2013-09-16 ENCOUNTER — Other Ambulatory Visit: Payer: Self-pay | Admitting: Obstetrics and Gynecology

## 2013-09-16 DIAGNOSIS — Z1231 Encounter for screening mammogram for malignant neoplasm of breast: Secondary | ICD-10-CM

## 2013-10-15 ENCOUNTER — Ambulatory Visit
Admission: RE | Admit: 2013-10-15 | Discharge: 2013-10-15 | Disposition: A | Discharge status: Home / Self Care | Payer: Medicaid Other | Source: Ambulatory Visit | Attending: Obstetrics and Gynecology | Admitting: Obstetrics and Gynecology

## 2013-10-15 ENCOUNTER — Encounter (INDEPENDENT_AMBULATORY_CARE_PROVIDER_SITE_OTHER): Payer: Self-pay

## 2013-10-15 DIAGNOSIS — Z1231 Encounter for screening mammogram for malignant neoplasm of breast: Secondary | ICD-10-CM

## 2017-05-16 ENCOUNTER — Other Ambulatory Visit (HOSPITAL_COMMUNITY)
Admission: RE | Admit: 2017-05-16 | Discharge: 2017-05-16 | Disposition: A | Discharge status: Home / Self Care | Payer: Medicaid Other | Source: Ambulatory Visit | Attending: Family Medicine | Admitting: Family Medicine

## 2017-05-16 ENCOUNTER — Other Ambulatory Visit: Payer: Self-pay | Admitting: Family Medicine

## 2017-05-16 DIAGNOSIS — Z124 Encounter for screening for malignant neoplasm of cervix: Secondary | ICD-10-CM | POA: Diagnosis present

## 2017-05-21 LAB — URINE CYTOLOGY ANCILLARY ONLY
Bacterial vaginitis: POSITIVE — AB
CANDIDA VAGINITIS: NEGATIVE
CHLAMYDIA, DNA PROBE: NEGATIVE
Neisseria Gonorrhea: NEGATIVE
Trichomonas: NEGATIVE

## 2017-05-22 LAB — CYTOLOGY - PAP
Diagnosis: NEGATIVE
HPV (WINDOPATH): NOT DETECTED

## 2019-01-07 ENCOUNTER — Other Ambulatory Visit (HOSPITAL_COMMUNITY): Payer: Self-pay | Admitting: Family Medicine

## 2019-01-07 DIAGNOSIS — M7989 Other specified soft tissue disorders: Secondary | ICD-10-CM

## 2019-01-07 DIAGNOSIS — M79605 Pain in left leg: Secondary | ICD-10-CM

## 2019-01-08 ENCOUNTER — Other Ambulatory Visit: Payer: Self-pay

## 2019-01-08 ENCOUNTER — Ambulatory Visit (HOSPITAL_COMMUNITY)
Admission: RE | Admit: 2019-01-08 | Discharge: 2019-01-08 | Disposition: A | Discharge status: Home / Self Care | Payer: Medicaid Other | Source: Ambulatory Visit | Attending: Family Medicine | Admitting: Family Medicine

## 2019-01-08 DIAGNOSIS — M79605 Pain in left leg: Secondary | ICD-10-CM | POA: Diagnosis present

## 2019-01-08 DIAGNOSIS — M7989 Other specified soft tissue disorders: Secondary | ICD-10-CM

## 2019-01-08 NOTE — Progress Notes (Signed)
Left lower extremity venous duplex completed. Preliminary results in Chart review CV Proc. [place] Tomorrow Dehaas,RVS 01/08/2019, 9:58 AM

## 2019-12-26 ENCOUNTER — Other Ambulatory Visit: Payer: Self-pay | Admitting: Family Medicine

## 2019-12-26 ENCOUNTER — Ambulatory Visit
Admission: RE | Admit: 2019-12-26 | Discharge: 2019-12-26 | Disposition: A | Discharge status: Home / Self Care | Payer: Medicaid Other | Source: Ambulatory Visit | Attending: Family Medicine | Admitting: Family Medicine

## 2019-12-26 DIAGNOSIS — R52 Pain, unspecified: Secondary | ICD-10-CM

## 2019-12-26 DIAGNOSIS — R600 Localized edema: Secondary | ICD-10-CM

## 2020-10-28 ENCOUNTER — Emergency Department (HOSPITAL_COMMUNITY): Payer: Medicaid Other

## 2020-10-28 ENCOUNTER — Other Ambulatory Visit: Payer: Self-pay

## 2020-10-28 ENCOUNTER — Emergency Department (HOSPITAL_COMMUNITY)
Admission: EM | Admit: 2020-10-28 | Discharge: 2020-10-29 | Disposition: A | Discharge status: Home / Self Care | Payer: Medicaid Other | Attending: Emergency Medicine | Admitting: Emergency Medicine

## 2020-10-28 ENCOUNTER — Encounter (HOSPITAL_COMMUNITY): Payer: Self-pay

## 2020-10-28 DIAGNOSIS — E119 Type 2 diabetes mellitus without complications: Secondary | ICD-10-CM | POA: Insufficient documentation

## 2020-10-28 DIAGNOSIS — I10 Essential (primary) hypertension: Secondary | ICD-10-CM | POA: Diagnosis not present

## 2020-10-28 DIAGNOSIS — Z79899 Other long term (current) drug therapy: Secondary | ICD-10-CM | POA: Diagnosis not present

## 2020-10-28 DIAGNOSIS — Z20822 Contact with and (suspected) exposure to covid-19: Secondary | ICD-10-CM | POA: Insufficient documentation

## 2020-10-28 DIAGNOSIS — R4182 Altered mental status, unspecified: Secondary | ICD-10-CM | POA: Diagnosis present

## 2020-10-28 DIAGNOSIS — Z7984 Long term (current) use of oral hypoglycemic drugs: Secondary | ICD-10-CM | POA: Diagnosis not present

## 2020-10-28 DIAGNOSIS — R410 Disorientation, unspecified: Secondary | ICD-10-CM | POA: Diagnosis not present

## 2020-10-28 DIAGNOSIS — F79 Unspecified intellectual disabilities: Secondary | ICD-10-CM

## 2020-10-28 DIAGNOSIS — Z794 Long term (current) use of insulin: Secondary | ICD-10-CM | POA: Diagnosis not present

## 2020-10-28 LAB — URINALYSIS, ROUTINE W REFLEX MICROSCOPIC
Bilirubin Urine: NEGATIVE
Glucose, UA: NEGATIVE mg/dL
Hgb urine dipstick: NEGATIVE
Ketones, ur: NEGATIVE mg/dL
Leukocytes,Ua: NEGATIVE
Nitrite: NEGATIVE
Protein, ur: NEGATIVE mg/dL
Specific Gravity, Urine: 1.009 (ref 1.005–1.030)
pH: 5 (ref 5.0–8.0)

## 2020-10-28 LAB — CBC WITH DIFFERENTIAL/PLATELET
Abs Immature Granulocytes: 0.03 10*3/uL (ref 0.00–0.07)
Basophils Absolute: 0.1 10*3/uL (ref 0.0–0.1)
Basophils Relative: 1 %
Eosinophils Absolute: 0.1 10*3/uL (ref 0.0–0.5)
Eosinophils Relative: 1 %
HCT: 36.4 % (ref 36.0–46.0)
Hemoglobin: 12.2 g/dL (ref 12.0–15.0)
Immature Granulocytes: 0 %
Lymphocytes Relative: 42 %
Lymphs Abs: 3.8 10*3/uL (ref 0.7–4.0)
MCH: 30.5 pg (ref 26.0–34.0)
MCHC: 33.5 g/dL (ref 30.0–36.0)
MCV: 91 fL (ref 80.0–100.0)
Monocytes Absolute: 0.6 10*3/uL (ref 0.1–1.0)
Monocytes Relative: 6 %
Neutro Abs: 4.6 10*3/uL (ref 1.7–7.7)
Neutrophils Relative %: 50 %
Platelets: 339 10*3/uL (ref 150–400)
RBC: 4 MIL/uL (ref 3.87–5.11)
RDW: 12.9 % (ref 11.5–15.5)
WBC: 9.1 10*3/uL (ref 4.0–10.5)
nRBC: 0 % (ref 0.0–0.2)

## 2020-10-28 LAB — PROTIME-INR
INR: 0.9 (ref 0.8–1.2)
Prothrombin Time: 12.1 seconds (ref 11.4–15.2)

## 2020-10-28 LAB — COMPREHENSIVE METABOLIC PANEL
ALT: 58 U/L — ABNORMAL HIGH (ref 0–44)
AST: 59 U/L — ABNORMAL HIGH (ref 15–41)
Albumin: 4.5 g/dL (ref 3.5–5.0)
Alkaline Phosphatase: 68 U/L (ref 38–126)
Anion gap: 8 (ref 5–15)
BUN: 14 mg/dL (ref 6–20)
CO2: 27 mmol/L (ref 22–32)
Calcium: 9.6 mg/dL (ref 8.9–10.3)
Chloride: 100 mmol/L (ref 98–111)
Creatinine, Ser: 0.77 mg/dL (ref 0.44–1.00)
GFR, Estimated: >60 mL/min (ref >60)
Glucose, Bld: 152 mg/dL — ABNORMAL HIGH (ref 70–99)
Potassium: 3.5 mmol/L (ref 3.5–5.1)
Sodium: 135 mmol/L (ref 135–145)
Total Bilirubin: 0.7 mg/dL (ref 0.3–1.2)
Total Protein: 8 g/dL (ref 6.5–8.1)

## 2020-10-28 LAB — RAPID URINE DRUG SCREEN, HOSP PERFORMED
Amphetamines: NOT DETECTED
Barbiturates: NOT DETECTED
Benzodiazepines: NOT DETECTED
Cocaine: NOT DETECTED
Opiates: NOT DETECTED
Tetrahydrocannabinol: NOT DETECTED

## 2020-10-28 LAB — AMMONIA: Ammonia: 33 umol/L (ref 9–35)

## 2020-10-28 LAB — RESP PANEL BY RT-PCR (FLU A&B, COVID) ARPGX2
Influenza A by PCR: NEGATIVE
Influenza B by PCR: NEGATIVE
SARS Coronavirus 2 by RT PCR: NEGATIVE

## 2020-10-28 LAB — CBG MONITORING, ED: Glucose-Capillary: 145 mg/dL — ABNORMAL HIGH (ref 70–99)

## 2020-10-28 LAB — ETHANOL: Alcohol, Ethyl (B): <10 mg/dL (ref <10)

## 2020-10-28 LAB — APTT: aPTT: 26 seconds (ref 24–36)

## 2020-10-28 MED ORDER — ACETAMINOPHEN 325 MG PO TABS: 650.0000 mg | ORAL_TABLET | ORAL | Status: DC | PRN

## 2020-10-28 NOTE — ED Triage Notes (Signed)
Pt to ED by POV from home with her sister. Per sister pt has been having episodes of being non responsive verbally intermittently since Saturday. Pt is responsive in triage, although confused. Sister reports episodes of talking to herself and trembling. Pt has extensive psych history. Per sister, pt has undergone a change in meds recently. Arrives A+Ox2. Vss, NADN.

## 2020-10-28 NOTE — ED Provider Notes (Signed)
[facility]-EMERGENCY DEPT Provider Note   CSN: [id number] Arrival date & time: 10/28/20  1904     History Chief Complaint  Patient presents with  . Altered Mental Status    [name] is a 55 y.o. female.  HPI Patient presents with her sister who provides the history, the patient has psychiatric disease, level 5 caveat secondary to psychiatric condition and mental retardation. History notes that at baseline the patient is interactive, can carry conversation, performs ADL independently.  She typically goes to a daycare facility during the day.  Now over the past 4 days patient's behavior has been entirely different from baseline with repetitive speech, inability to care for self, substantial change in interactivity.  The patient responds to questions by repeating them when asked, cannot specify any details of her current HPI. According to the sister the patient has had similar recent change in her behavioral health medications, is not seemingly taking any psychiatric meds currently, having had difficulty with consistency with her psychiatrist.    Past Medical History:  Diagnosis Date  . Diabetes mellitus without complication (HCC)   . Hypertension   . Immune deficiency disorder (HCC)   . Mild mental retardation     There are no problems to display for this patient.   History reviewed. No pertinent surgical history.   OB History   No obstetric history on file.     No family history on file.  Social History   Tobacco Use  . Smoking status: Never Smoker  . Smokeless tobacco: Never Used  Substance Use Topics  . Alcohol use: No  . Drug use: Never    Home Medications Prior to Admission medications   Medication Sig Start Date End Date Taking? Authorizing Provider  benztropine (COGENTIN) 0.5 MG tablet Take 0.5 mg by mouth 2 (two) times daily.   Yes [provider]  divalproex (DEPAKOTE ER) 500 MG 24 hr tablet Take 1,000 mg by mouth at  bedtime.   Yes [provider]  risperiDONE (RISPERDAL) 2 MG tablet Take 2 mg by mouth at bedtime.   Yes [provider]  ALPRAZolam Prudy Feeler) 0.5 MG tablet Take 0.5 mg by mouth 2 (two) times daily as needed for anxiety.    [provider]  cyclobenzaprine (FLEXERIL) 10 MG tablet Take 10 mg by mouth 2 (two) times daily.    [provider]  glimepiride (AMARYL) 2 MG tablet Take 2 mg by mouth daily before breakfast.    [provider]  HYDROcodone-acetaminophen (NORCO/VICODIN) 5-325 MG per tablet Take 1 tablet by mouth every 4 (four) hours as needed for pain.    [provider]  ibuprofen (ADVIL,MOTRIN) 800 MG tablet Take 800 mg by mouth 2 (two) times daily as needed for pain.    [provider]  insulin aspart (NOVOLOG) 100 UNIT/ML injection Inject 1-7 Units into the skin 3 (three) times daily as needed for high blood sugar (Per sliding scale: 120-150=1 unit, 150-200=2 units, 201-250=4 units, 251-300=6 units).    [provider]  loratadine (CLARITIN) 10 MG tablet Take 10 mg by mouth daily.    [provider]  metFORMIN (GLUCOPHAGE) 500 MG tablet Take 500 mg by mouth 2 (two) times daily with a meal.    [provider]  sertraline (ZOLOFT) 100 MG tablet Take 50 mg by mouth at bedtime.     [provider]  simvastatin (ZOCOR) 20 MG tablet Take 20 mg by mouth at bedtime.  [provider]  traZODone (DESYREL) 100 MG tablet Take 50-100 mg by mouth at bedtime as needed for sleep.     [provider]  valACYclovir (VALTREX) 500 MG tablet Take 500 mg by mouth at bedtime.    [provider]    Allergies    Penicillins, Sulfa antibiotics, and Tomato  Review of Systems   Review of Systems  Unable to perform ROS: Psychiatric disorder    Physical Exam Updated Vital Signs BP 136/75 (BP Location: Right Arm)   Pulse 81   Temp 98.1 F (36.7 C) (Oral)   Resp 16   Ht 5\' 1"  (1.549  m)   Wt 83 kg   SpO2 99%   BMI 34.57 kg/m   Physical Exam Vitals and nursing note reviewed.  Constitutional:      General: She is not in acute distress.    Appearance: She is well-developed.  HENT:     Head: Normocephalic and atraumatic.  Eyes:     Conjunctiva/sclera: Conjunctivae normal.  Cardiovascular:     Rate and Rhythm: Normal rate and regular rhythm.  Pulmonary:     Effort: Pulmonary effort is normal. No respiratory distress.     Breath sounds: Normal breath sounds. No stridor.  Abdominal:     General: There is no distension.  Skin:    General: Skin is warm and dry.  Neurological:     Mental Status: She is alert.     Cranial Nerves: No cranial nerve deficit.     Comments: Moves all extremities to command and spontaneously, no facial asymmetry, speech is clear, though repetitive to questions.  Psychiatric:        Behavior: Behavior is slowed and withdrawn.        Cognition and Memory: Cognition is impaired. Memory is impaired.     ED Results / Procedures / Treatments   Labs (all labs ordered are listed, but only abnormal results are displayed) Labs Reviewed  COMPREHENSIVE METABOLIC PANEL - Abnormal; Notable for the following components:      Result Value   Glucose, Bld 152 (*)    AST 59 (*)    ALT 58 (*)    All other components within normal limits  CBG MONITORING, ED - Abnormal; Notable for the following components:   Glucose-Capillary 145 (*)    All other components within normal limits  RESP PANEL BY RT-PCR (FLU A&B, COVID) ARPGX2  CBC WITH DIFFERENTIAL/PLATELET  URINALYSIS, ROUTINE W REFLEX MICROSCOPIC  AMMONIA  APTT  PROTIME-INR  ETHANOL  RAPID URINE DRUG SCREEN, HOSP PERFORMED    EKG None  Radiology CT Head Wo Contrast  Result Date: 10/28/2020 CLINICAL DATA:  Mental status changes of unknown cause. EXAM: CT HEAD WITHOUT CONTRAST TECHNIQUE: Contiguous axial images were obtained from the base of the skull through the vertex without intravenous  contrast. COMPARISON:  03/29/2007 FINDINGS: Brain: The brain has normal appearance without evidence of atrophy, old or acute infarction, mass lesion, hemorrhage, hydrocephalus or extra-axial collection. Vascular: Evident no abnormal vascular finding. Skull: Normal Sinuses/Orbits: Clear/normal Other: None IMPRESSION: Normal examination. No abnormality seen to explain mental status changes. Electronically Signed   By: 05/29/2007 M.D.   On: 10/28/2020 20:30   DG Chest Portable 1 View  Result Date: 10/28/2020 CLINICAL DATA:  Altered mental status. EXAM: PORTABLE CHEST 1 VIEW COMPARISON:  Remote radiograph 03/25/2009 FINDINGS: Lung volumes are low. Mild bibasilar atelectasis. Normal heart size for technique. No pulmonary edema, pleural effusion, or pneumothorax. No acute osseous  abnormalities are seen. IMPRESSION: Low lung volumes with bibasilar atelectasis. Electronically Signed   By: Narda Rutherford M.D.   On: 10/28/2020 20:20    Procedures Procedures   Medications Ordered in ED Medications  acetaminophen (TYLENOL) tablet 650 mg (has no administration in time range)    ED Course  I have reviewed the triage vital signs and the nursing notes.  Pertinent labs & imaging results that were available during my care of the patient were reviewed by me and considered in my medical decision making (see chart for details).  Adult female with noted mental retardation and psychiatric disease presents with new change in interactivity according to sister. Some suspicion for change in psych meds contributing to the patient's condition.  Though she is not homicidal or suicidal, given her clear difference from baseline mental status, patient was medically cleared for behavioral health evaluation, consideration of her medication regimen. Notably, the patient's medication regimen will require assistance from our pharmacy colleagues for medication reconciliation as records are incomplete. Final Clinical  Impression(s) / ED Diagnoses Final diagnoses:  Confusion     Gerhard Munch, MD 10/28/20 2126

## 2020-10-29 DIAGNOSIS — F79 Unspecified intellectual disabilities: Secondary | ICD-10-CM

## 2020-10-29 DIAGNOSIS — R410 Disorientation, unspecified: Secondary | ICD-10-CM | POA: Insufficient documentation

## 2020-10-29 DIAGNOSIS — R4182 Altered mental status, unspecified: Secondary | ICD-10-CM | POA: Diagnosis present

## 2020-10-29 LAB — CBG MONITORING, ED: Glucose-Capillary: 126 mg/dL — ABNORMAL HIGH (ref 70–99)

## 2020-10-29 NOTE — BH Assessment (Signed)
Comprehensive Clinical Assessment (CCA) Note  10/29/2020 [name] [id number]   Disposition Liborio Nixon, NP, recommends overnight observation for safety and stabilization with psych reassessment in the AM. Roney Mans, RN, informed of disposition via secure chat.   The patient demonstrates the following risk factors for suicide: Chronic risk factors for suicide include: psychiatric disorder of bipolar, depression and mild MR. Acute risk factors for suicide include: N/A. Protective factors for this patient include: positive social support, positive therapeutic relationship and responsibility to others (children, family). Considering these factors, the overall suicide risk at this point appears to be moderate. Patient is not appropriate for outpatient follow up.  Flowsheet Row ED from 10/28/2020 in [facility]  C-SSRS RISK CATEGORY No Risk     1:1 risk  [name] is a 55 year old female presenting voluntarily to [facility] due to episodes of being non responsive verbally, intermittently starting this past Saturday. Patient is accompanied by her sister [name]. Patient has been living with sister for 1.5 years, prior patient was living in group home. Patient is not verbally responsive during initial questions. Sister reported episodes of patient talking to herself and trembling. Patient is currently being seen by Dr. [name] for medication management. Sister reported that medications were not working and there was a recent change in medication. When asked about SI, HI and psychosis, patient continued to stare and look at clinician with no response. Sister denied presence of SI and HI with patient. Clinician later asked patient, how many hours of sleep do you get at night, patient stated "24", sister reported that was not true, stating she gets 9 hours sleep. After that question patient didn't answer any more questions and continued to stare at  clinician. Patient appeared to be confused during assessment.   PER EDP NOTE 10/28/20 Patient presents with her sister who provides the history, the patient has psychiatric disease, level 5 caveat secondary to psychiatric condition and mental retardation. History notes that at baseline the patient is interactive, can carry conversation, performs ADL independently.  She typically goes to a daycare facility during the day.  Now over the past 4 days patient's behavior has been entirely different from baseline with repetitive speech, inability to care for self, substantial change in interactivity.  The patient responds to questions by repeating them when asked, cannot specify any details of her current HPI.  Chief Complaint:  Chief Complaint  Patient presents with  . Altered Mental Status   Visit Diagnosis:  Hx of bipolar Hx of depression Hx of Mild MR  CCA Biopsychosocial Intake/Chief Complaint:  Per sister patient experiencing episodes of non-responsive verbal communication intermittently since Saturday.  Current Symptoms/Problems: Patient appearing confusing, not communicating at times and repetitive speech.  Patient Reported Schizophrenia/Schizoaffective Diagnosis in Past: No data recorded  Strengths: uta  Preferences: uta  Abilities: uta  Type of Services Patient Feels are Needed: uta  Initial Clinical Notes/Concerns: No data recorded  Mental Health Symptoms Depression:  -- (uta)   Duration of Depressive symptoms: No data recorded  Mania:  None   Anxiety:   -- Rich Reining)   Psychosis:  -- Rich Reining)   Duration of Psychotic symptoms: No data recorded  Trauma:  -- Rich Reining)   Obsessions:  -- Rich Reining)   Compulsions:  -- Rich Reining)   Inattention:  -- Rich Reining)   Hyperactivity/Impulsivity:  -- Rich Reining)   Oppositional/Defiant Behaviors:  -- Rich Reining)   Emotional Irregularity:  -- Rich Reining)   Other Mood/Personality Symptoms:  No  data recorded   Mental Status Exam Appearance and self-care  Stature:   Average   Weight:  Average weight   Clothing:  Neat/clean   Grooming:  Normal   Cosmetic use:  None   Posture/gait:  Normal   Motor activity:  Not Remarkable   Sensorium  Attention:  Confused   Concentration:  -- (poor)   Orientation:  No data recorded  Recall/memory:  Defective in Immediate; Defective in Recent; Defective in Remote; Defective in Short-term   Affect and Mood  Affect:  Flat   Mood:  No data recorded  Relating  Eye contact:  Normal   Facial expression:  No data recorded  Attitude toward examiner:  No data recorded  Thought and Language  Speech flow: No data recorded  Thought content:  No data recorded  Preoccupation:  No data recorded  Hallucinations:  -- Rich Reining)   Organization:  No data recorded  [employer] of Knowledge:  No data recorded  Intelligence:  No data recorded  Abstraction:  No data recorded  Judgement:  Poor   Reality Testing:  No data recorded  Insight:  Poor; Lacking   Decision Making:  Confused   Social Functioning  Social Maturity:  No data recorded  Social Judgement:  Victimized   Stress  Stressors:  Other (Comment) (mental health)   Coping Ability:  Deficient supports   Skill Deficits:  Self-control; Decision making; Self-care; Activities of daily living   Supports:  Family    Religion: Religion/Spirituality Are 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/product designer)  Leisure/Recreation: Leisure / Recreation Do You Have Hobbies?: Yes Leisure and Hobbies: coloring and basketball  Exercise/Diet: Exercise/Diet Do You Exercise?: No Do You Follow a Special Diet?:  (uta) Do You Have Any Trouble Sleeping?: No  CCA Employment/Education Employment/Work Situation: Employment / Work Situation Employment situation: On disability Why is patient on disability: mental health How long has patient been on disability: uta Has patient ever been in the [employer]?: No  Education: Education Is Patient Currently Attending School?:  No Did Garment/textile technologist From [employer]?: Yes Did You Have Any Scientist, research (life sciences) In School?: uta Did You Have An Individualized Education Program (IIEP):  Rich Reining) Did You Have Any Difficulty At School?:  Rich Reining) Patient's Education Has Been Impacted by Current Illness:  (uta)  CCA Family/Childhood History Family and Relationship History: Family history Does patient have children?: No  Childhood History:  Childhood History Description of patient's relationship with caregiver when they were a child: uta Patient's description of current relationship with people who raised him/her: uta How were you disciplined when you got in trouble as a child/adolescent?: uta Does patient have siblings?: Yes Number of Siblings: 11 Description of patient's current relationship with siblings: good Did patient suffer any verbal/emotional/physical/sexual abuse as a child?: No Did patient suffer from severe childhood neglect?: No Has patient ever been sexually abused/assaulted/raped as an adolescent or adult?: No  Child/Adolescent Assessment:   CCA Substance Use Alcohol/Drug Use: Alcohol / Drug Use Pain Medications: see MAR Prescriptions: see MAR Over the Counter: see MAR History of alcohol / drug use?: No history of alcohol / drug abuse   ASAM's:  Six Dimensions of Multidimensional Assessment  Dimension 1:  Acute Intoxication and/or Withdrawal Potential:      Dimension 2:  Biomedical Conditions and Complications:      Dimension 3:  Emotional, Behavioral, or Cognitive Conditions and Complications:     Dimension 4:  Readiness to Change:     Dimension 5:  Relapse, Continued use, or Continued Problem Potential:     Dimension 6:  Recovery/Living Environment:     ASAM Severity Score:    ASAM Recommended Level of Treatment:     Substance use Disorder (SUD)   Recommendations for Services/Supports/Treatments:   DSM5 Diagnoses: There are no problems to display for this patient.  Patient Centered  Plan: Patient is on the following Treatment Plan(s):    Referrals to Alternative Service(s): Referred to Alternative Service(s):   Place:   Date:   Time:    Referred to Alternative Service(s):   Place:   Date:   Time:    Referred to Alternative Service(s):   Place:   Date:   Time:    Referred to Alternative Service(s):   Place:   Date:   Time:     Burnetta Sabin, [facility]

## 2020-10-29 NOTE — ED Notes (Signed)
Pt belongings: black bedroom shoes, blue jacket at nurse desk

## 2020-10-29 NOTE — BH Assessment (Signed)
[facility] Assessment Progress Note   Per Maxie Barb, NP, this voluntary pt does not require psychiatric hospitalization at this time.  Pt is psychiatrically cleared.  Discharge instructions advise pt to continue treatment with her current outpatient provider.  A neurology consult will reportedly be ordered for pt.  EDP Kristine Royal, MD and pt's nurse, Rosette Reveal, have been notified.  Doylene Canning, MA Triage Specialist [phone]

## 2020-10-29 NOTE — Discharge Instructions (Addendum)
For your behavioral health needs you are advised to continue treatment with your current outpatient provider.  Follow up with [facility] as instructed in the outpatient setting.

## 2020-10-29 NOTE — ED Provider Notes (Signed)
Patient seen and evaluated after psychiatry team has finished their evaluation.  Patient in her room calmly eating lunch during evaluation.  She is in no distress.  Indications for further Neuro evaluation discussed extensively with the patient's family member at bedside.  Patient without indication for additional inpatient neuro evaluation.  Previously completed medical screening procedures are without significant abnormality.  Patient is appropriate for further outpatient work-up.  This is discussed extensively with the patient's family.  Referral for close outpatient follow-up with neurology is suggested.  Strict return precautions given and understood.     Wynetta Fines, MD 10/29/20 [id number]

## 2020-10-29 NOTE — ED Notes (Signed)
Family member in the room is upset, cussing and being loud because the patient is having her TTS consult at 4am, it was explained to her that we do things 24/7 in the ED  Family member continues to talk under her breath a little but where I can hear her

## 2020-10-29 NOTE — BH Assessment (Signed)
Disposition Liborio Nixon, NP, recommends overnight observation for safety and stabilization with psych reassessment in the AM. Roney Mans, RN, informed of disposition via secure chat.

## 2020-10-29 NOTE — ED Notes (Signed)
Pt discharged from this ED with parents with no further questions at this time.

## 2020-10-29 NOTE — Consult Note (Signed)
[facility] Face-to-Face Psychiatry Consult   Reason for Consult:  Psych consult Referring Physician:  Gerhard Munch, MD Patient Identification: [name] MRN:  [id number] Principal Diagnosis: Change in mental status Diagnosis:  Principal Problem:   Change in mental status Active Problems:   Intellectual disability   Total Time spent with patient: 20 minutes  Subjective:   [name] is a 55 y.o. female patient admitted with mental status changes. Patient has past history of mild MR, bipolar and depression.   On assessment patient presents flat and withdrawn; no tracking to voice or following commands. Sister [name] at bedside answering questions and providing collateral; patient remained silent throughout assessment. Sister reports at baseline patient engages in conversation and cares for her self within the home; says change noticed Saturday when patient presented with echolalia-type symptoms and periods of staring off. Denies any known history of seizures or CVA. CT completed upon admission; unremarkable. Sister reports patient history of intellectual disability and bipolar; no psychiatric medication or treatment in the past 8 months after psychiatrist moved to different practice, appointment with new provider Tuesday. Sister denies any change in temperament; endorses observing patient possibly mumbling under breath, not present on assessment.   Provider attempted to assess orientation; no response. Provider asked patient to squeeze two fingers bilaterally; patient did not respond or follow command. Patient did not answer questions regarding self harm, homicidal ideations, or auditory/visual hallucinations; does not appear to be actively responding to any external/internal stimuli at this time. Patient's sister denies any concerns for safety or self harm in the home; denies any increased aggression or psychotic symptoms. Patient has not received any psychotropic medications in past 8  months; appointment with provider Tuesday.   Based on patient collateral provided and patient presentation neurology consult placed to rule out any underlying neurologic causes contributing to patient presentation.   HPI:   [name] is a 55 year old female patient admitted with mental status changes including verbal non-responsiveness and intermittently staring off. Patient is currently living with and being cared for by her sister [name] who reports patient has not been on any psychotropic medications for past 8 months. Current outpatient provider is [name].   Past Psychiatric History:   -mild MR  Risk to Self:  no Risk to Others:  no Prior Inpatient Therapy:  no Prior Outpatient Therapy:  yes  Past Medical History:  Past Medical History:  Diagnosis Date  . Diabetes mellitus without complication (HCC)   . Hypertension   . Immune deficiency disorder (HCC)   . Mild mental retardation    History reviewed. No pertinent surgical history. Family History: No family history on file. Family Psychiatric  History:   -not noted Social History:  Social History   Substance and Sexual Activity  Alcohol Use No     Social History   Substance and Sexual Activity  Drug Use Never    Social History   Socioeconomic History  . Marital status: Single    Spouse name: Not on file  . Number of children: Not on file  . Years of education: Not on file  . Highest education level: Not on file  Occupational History  . Not on file  Tobacco Use  . Smoking status: Never Smoker  . Smokeless tobacco: Never Used  Substance and Sexual Activity  . Alcohol use: No  . Drug use: Never  . Sexual activity: Never  Other Topics Concern  . Not on file  Social History  Narrative  . Not on file   Social Determinants of Health   Financial Resource Strain: Not on file  Food Insecurity: Not on file  Transportation Needs: Not on file  Physical Activity: Not on file  Stress: Not on file   Social Connections: Not on file   Additional Social History:   Allergies:   Allergies  Allergen Reactions  . Penicillins   . Sulfa Antibiotics   . Tomato    Labs:  Results for orders placed or performed during the hospital encounter of 10/28/20 (from the past 48 hour(s))  Urinalysis, Routine w reflex microscopic     Status: None   Collection Time: 10/28/20  7:44 PM  Result Value Ref Range   Color, Urine YELLOW YELLOW   APPearance CLEAR CLEAR   Specific Gravity, Urine 1.009 1.005 - 1.030   pH 5.0 5.0 - 8.0   Glucose, UA NEGATIVE NEGATIVE mg/dL   Hgb urine dipstick NEGATIVE NEGATIVE   Bilirubin Urine NEGATIVE NEGATIVE   Ketones, ur NEGATIVE NEGATIVE mg/dL   Protein, ur NEGATIVE NEGATIVE mg/dL   Nitrite NEGATIVE NEGATIVE   Leukocytes,Ua NEGATIVE NEGATIVE    Comment: Performed at [facility], [address] [place], [place] [address]  Urine rapid drug screen (hosp performed)     Status: None   Collection Time: 10/28/20  7:45 PM  Result Value Ref Range   Opiates NONE DETECTED NONE DETECTED   Cocaine NONE DETECTED NONE DETECTED   Benzodiazepines NONE DETECTED NONE DETECTED   Amphetamines NONE DETECTED NONE DETECTED   Tetrahydrocannabinol NONE DETECTED NONE DETECTED   Barbiturates NONE DETECTED NONE DETECTED    Comment: (NOTE) DRUG SCREEN FOR MEDICAL PURPOSES ONLY.  IF CONFIRMATION IS NEEDED FOR ANY PURPOSE, NOTIFY LAB WITHIN 5 DAYS.  LOWEST DETECTABLE LIMITS FOR URINE DRUG SCREEN Drug Class                     Cutoff (ng/mL) Amphetamine and metabolites    1000 Barbiturate and metabolites    200 Benzodiazepine                 200 Tricyclics and metabolites     300 Opiates and metabolites        300 Cocaine and metabolites        300 THC                            50 Performed at [facility], [address], [place], [place] [address]   CBG monitoring, ED     Status: Abnormal   Collection Time: 10/28/20  8:09 PM  Result  Value Ref Range   Glucose-Capillary 145 (H) 70 - 99 mg/dL    Comment: Glucose reference range applies only to samples taken after fasting for at least 8 hours.  Comprehensive metabolic panel     Status: Abnormal   Collection Time: 10/28/20  8:13 PM  Result Value Ref Range   Sodium 135 135 - 145 mmol/L   Potassium 3.5 3.5 - 5.1 mmol/L   Chloride 100 98 - 111 mmol/L   CO2 27 22 - 32 mmol/L   Glucose, Bld 152 (H) 70 - 99 mg/dL    Comment: Glucose reference range applies only to samples taken after fasting for at least 8 hours.   BUN 14 6 - 20 mg/dL   Creatinine, Ser 6.73 0.44 - 1.00 mg/dL   Calcium 9.6 8.9 - 10.3  mg/dL   Total Protein 8.0 6.5 - 8.1 g/dL   Albumin 4.5 3.5 - 5.0 g/dL   AST 59 (H) 15 - 41 U/L   ALT 58 (H) 0 - 44 U/L   Alkaline Phosphatase 68 38 - 126 U/L   Total Bilirubin 0.7 0.3 - 1.2 mg/dL   GFR, Estimated >16 >10 mL/min    Comment: (NOTE) Calculated using the CKD-EPI Creatinine Equation (2021)    Anion gap 8 5 - 15    Comment: Performed at [facility], [address], [place], [place] [address]  CBC with Differential     Status: None   Collection Time: 10/28/20  8:13 PM  Result Value Ref Range   WBC 9.1 4.0 - 10.5 K/uL   RBC 4.00 3.87 - 5.11 MIL/uL   Hemoglobin 12.2 12.0 - 15.0 g/dL   HCT 40.9 81.1 - 91.4 %   MCV 91.0 80.0 - 100.0 fL   MCH 30.5 26.0 - 34.0 pg   MCHC 33.5 30.0 - 36.0 g/dL   RDW 78.2 95.6 - 21.3 %   Platelets 339 150 - 400 K/uL   nRBC 0.0 0.0 - 0.2 %   Neutrophils Relative % 50 %   Neutro Abs 4.6 1.7 - 7.7 K/uL   Lymphocytes Relative 42 %   Lymphs Abs 3.8 0.7 - 4.0 K/uL   Monocytes Relative 6 %   Monocytes Absolute 0.6 0.1 - 1.0 K/uL   Eosinophils Relative 1 %   Eosinophils Absolute 0.1 0.0 - 0.5 K/uL   Basophils Relative 1 %   Basophils Absolute 0.1 0.0 - 0.1 K/uL   Immature Granulocytes 0 %   Abs Immature Granulocytes 0.03 0.00 - 0.07 K/uL    Comment: Performed at [facility], [address], [place], [place] [address]  Ammonia     Status: None   Collection Time: 10/28/20  8:13 PM  Result Value Ref Range   Ammonia 33 9 - 35 umol/L    Comment: Performed at [facility], [address], [place], [place] [address]  APTT     Status: None   Collection Time: 10/28/20  8:13 PM  Result Value Ref Range   aPTT 26 24 - 36 seconds    Comment: Performed at [facility], [address], [place], [place] [address]  Protime-INR     Status: None   Collection Time: 10/28/20  8:13 PM  Result Value Ref Range   Prothrombin Time 12.1 11.4 - 15.2 seconds   INR 0.9 0.8 - 1.2    Comment: (NOTE) INR goal varies based on device and disease states. Performed at [facility], [address], [place], [place] [address]   Ethanol     Status: None   Collection Time: 10/28/20  8:13 PM  Result Value Ref Range   Alcohol, Ethyl (B) <10 <10 mg/dL    Comment: (NOTE) Lowest detectable limit for serum alcohol is 10 mg/dL.  For medical purposes only. Performed at [facility], [address] [place], [place] [address]   Resp Panel by RT-PCR (Flu A&B, Covid) Nasopharyngeal Swab     Status: None   Collection Time: 10/28/20  9:24 PM   Specimen: Nasopharyngeal Swab; Nasopharyngeal(NP) swabs in vial transport medium  Result Value Ref Range   SARS Coronavirus 2 by RT PCR NEGATIVE NEGATIVE    Comment: (NOTE) SARS-CoV-2 target nucleic acids are NOT DETECTED.  The SARS-CoV-2 RNA is generally detectable  in upper respiratory specimens during the acute phase of infection. The lowest concentration of SARS-CoV-2 viral copies this assay can detect is 138 copies/mL. A negative result does not preclude SARS-Cov-2 infection and should not be used as the sole basis for treatment or other patient management decisions. A negative result may occur with  improper specimen collection/handling, submission of specimen other than nasopharyngeal  swab, presence of viral mutation(s) within the areas targeted by this assay, and inadequate number of viral copies(<138 copies/mL). A negative result must be combined with clinical observations, patient history, and epidemiological information. The expected result is Negative.  Fact Sheet for Patients:  BloggerCourse.comhttps://www.fda.gov/media/152166/download  Fact Sheet for Healthcare Providers:  SeriousBroker.ithttps://www.fda.gov/media/152162/download  This test is no t yet approved or cleared by the [place] FDA and  has been authorized for detection and/or diagnosis of SARS-CoV-2 by FDA under an Emergency Use Authorization (EUA). This EUA will remain  in effect (meaning this test can be used) for the duration of the COVID-19 declaration under Section 564(b)(1) of the Act, 21 U.S.C.section 360bbb-3(b)(1), unless the authorization is terminated  or revoked sooner.       Influenza A by PCR NEGATIVE NEGATIVE   Influenza B by PCR NEGATIVE NEGATIVE    Comment: (NOTE) The Xpert Xpress SARS-CoV-2/FLU/RSV plus assay is intended as an aid in the diagnosis of influenza from Nasopharyngeal swab specimens and should not be used as a sole basis for treatment. Nasal washings and aspirates are unacceptable for Xpert Xpress SARS-CoV-2/FLU/RSV testing.  Fact Sheet for Patients: BloggerCourse.comhttps://www.fda.gov/media/152166/download  Fact Sheet for Healthcare Providers: SeriousBroker.ithttps://www.fda.gov/media/152162/download  This test is not yet approved or cleared by the [place] FDA and has been authorized for detection and/or diagnosis of SARS-CoV-2 by FDA under an Emergency Use Authorization (EUA). This EUA will remain in effect (meaning this test can be used) for the duration of the COVID-19 declaration under Section 564(b)(1) of the Act, 21 U.S.C. section 360bbb-3(b)(1), unless the authorization is terminated or revoked.  Performed at [facility], [address] [place], [place] [id number]   CBG  monitoring, ED     Status: Abnormal   Collection Time: 10/29/20  7:22 AM  Result Value Ref Range   Glucose-Capillary 126 (H) 70 - 99 mg/dL    Comment: Glucose reference range applies only to samples taken after fasting for at least 8 hours.    Current Facility-Administered Medications  Medication Dose Route Frequency Provider Last Rate Last Admin  . acetaminophen (TYLENOL) tablet 650 mg  650 mg Oral Q4H PRN Gerhard MunchLockwood, Robert, MD       Current Outpatient Medications  Medication Sig Dispense Refill  . benztropine (COGENTIN) 0.5 MG tablet Take 0.5 mg by mouth 2 (two) times daily.    . divalproex (DEPAKOTE ER) 500 MG 24 hr tablet Take 1,000 mg by mouth at bedtime.    . risperiDONE (RISPERDAL) 2 MG tablet Take 2 mg by mouth at bedtime.    . ALPRAZolam (XANAX) 0.5 MG tablet Take 0.5 mg by mouth 2 (two) times daily as needed for anxiety.    . cyclobenzaprine (FLEXERIL) 10 MG tablet Take 10 mg by mouth 2 (two) times daily.    Marland Kitchen. glimepiride (AMARYL) 2 MG tablet Take 2 mg by mouth daily before breakfast.    . HYDROcodone-acetaminophen (NORCO/VICODIN) 5-325 MG per tablet Take 1 tablet by mouth every 4 (four) hours as needed for pain.    Marland Kitchen. ibuprofen (ADVIL,MOTRIN) 800 MG tablet Take 800 mg by mouth 2 (two) times daily  as needed for pain.    Marland Kitchen insulin aspart (NOVOLOG) 100 UNIT/ML injection Inject 1-7 Units into the skin 3 (three) times daily as needed for high blood sugar (Per sliding scale: 120-150=1 unit, 150-200=2 units, 201-250=4 units, 251-300=6 units).    . loratadine (CLARITIN) 10 MG tablet Take 10 mg by mouth daily.    . metFORMIN (GLUCOPHAGE) 500 MG tablet Take 500 mg by mouth 2 (two) times daily with a meal.    . sertraline (ZOLOFT) 100 MG tablet Take 50 mg by mouth at bedtime.     . simvastatin (ZOCOR) 20 MG tablet Take 20 mg by mouth at bedtime.    . traZODone (DESYREL) 100 MG tablet Take 50-100 mg by mouth at bedtime as needed for sleep.     . valACYclovir (VALTREX) 500 MG tablet Take 500  mg by mouth at bedtime.      Musculoskeletal: Strength & Muscle Tone: unable to assess Gait & Station: unable to assess Patient leans: N/A  Psychiatric Specialty Exam:  Presentation  General Appearance: No data recorded Eye Contact:No data recorded Speech:No data recorded Speech Volume:No data recorded Handedness:No data recorded  Mood and Affect  Mood:No data recorded Affect:No data recorded  Thought Process  Thought Processes:No data recorded Descriptions of Associations:No data recorded Orientation:No data recorded Thought Content:No data recorded History of Schizophrenia/Schizoaffective disorder:No data recorded Duration of Psychotic Symptoms:No data recorded Hallucinations:No data recorded Ideas of Reference:No data recorded Suicidal Thoughts:No data recorded Homicidal Thoughts:No data recorded  Sensorium  Memory:No data recorded Judgment:No data recorded Insight:No data recorded  Executive Functions  Concentration:No data recorded Attention Span:No data recorded Recall:No data recorded Fund of Knowledge:No data recorded Language:No data recorded  Psychomotor Activity  Psychomotor Activity:No data recorded  Assets  Assets:No data recorded  Sleep  Sleep:No data recorded  Physical Exam: Physical Exam Vitals and nursing note reviewed.  Psychiatric:        Mood and Affect: Affect is flat.        Speech: She is noncommunicative.        Behavior: Behavior is withdrawn.        Cognition and Memory: Cognition is impaired.     Comments: Patient unable to fully engage in assessment.      Review of Systems  Neurological: Positive for speech change.  Psychiatric/Behavioral: Negative.    Blood pressure 105/71, pulse 86, temperature 98.9 F (37.2 C), temperature source Oral, resp. rate 18, height 5\' 1"  (1.549 m), weight 83 kg, SpO2 (!) 89 %. Body mass index is 34.57 kg/m.  Treatment Plan Summary: Plan to refer patient to neurology for further  diagnostics. Patient is being psychiatrically cleared at this time.   Disposition: No evidence of imminent risk to self or others at present.   Patient does not meet criteria for psychiatric inpatient admission. Patient is not actively psychotic or delusional. Caregiver/sister denies any concerns of patient being a threat to herself or others at this time. Neuro consult placed to rule out any organic cause related to patient presentation.   , NP 10/29/2020 1:29 PM

## 2020-10-29 NOTE — ED Notes (Signed)
Patients belongings has been collected. One white bag in nursing station 1-8. Patient has changed into appropriate scrubs and is currently resting.

## 2020-10-30 ENCOUNTER — Inpatient Hospital Stay (HOSPITAL_COMMUNITY)
Admission: EM | Admit: 2020-10-30 | Discharge: 2020-11-03 | DRG: 101 | Disposition: A | Discharge status: Home Health | Payer: Medicaid Other | Attending: Internal Medicine | Admitting: Internal Medicine

## 2020-10-30 ENCOUNTER — Emergency Department (HOSPITAL_COMMUNITY): Payer: Medicaid Other

## 2020-10-30 ENCOUNTER — Other Ambulatory Visit: Payer: Self-pay

## 2020-10-30 ENCOUNTER — Encounter (HOSPITAL_COMMUNITY): Payer: Self-pay | Admitting: Emergency Medicine

## 2020-10-30 DIAGNOSIS — E162 Hypoglycemia, unspecified: Secondary | ICD-10-CM | POA: Diagnosis present

## 2020-10-30 DIAGNOSIS — G40909 Epilepsy, unspecified, not intractable, without status epilepticus: Principal | ICD-10-CM | POA: Diagnosis present

## 2020-10-30 DIAGNOSIS — E785 Hyperlipidemia, unspecified: Secondary | ICD-10-CM | POA: Diagnosis present

## 2020-10-30 DIAGNOSIS — E118 Type 2 diabetes mellitus with unspecified complications: Secondary | ICD-10-CM

## 2020-10-30 DIAGNOSIS — Z20822 Contact with and (suspected) exposure to covid-19: Secondary | ICD-10-CM | POA: Diagnosis present

## 2020-10-30 DIAGNOSIS — F79 Unspecified intellectual disabilities: Secondary | ICD-10-CM | POA: Diagnosis not present

## 2020-10-30 DIAGNOSIS — Z79899 Other long term (current) drug therapy: Secondary | ICD-10-CM

## 2020-10-30 DIAGNOSIS — Z5309 Procedure and treatment not carried out because of other contraindication: Secondary | ICD-10-CM

## 2020-10-30 DIAGNOSIS — R109 Unspecified abdominal pain: Secondary | ICD-10-CM

## 2020-10-30 DIAGNOSIS — F70 Mild intellectual disabilities: Secondary | ICD-10-CM | POA: Diagnosis present

## 2020-10-30 DIAGNOSIS — Z8249 Family history of ischemic heart disease and other diseases of the circulatory system: Secondary | ICD-10-CM

## 2020-10-30 DIAGNOSIS — G9340 Encephalopathy, unspecified: Secondary | ICD-10-CM | POA: Diagnosis not present

## 2020-10-30 DIAGNOSIS — Z794 Long term (current) use of insulin: Secondary | ICD-10-CM

## 2020-10-30 DIAGNOSIS — F319 Bipolar disorder, unspecified: Secondary | ICD-10-CM | POA: Diagnosis present

## 2020-10-30 DIAGNOSIS — R7989 Other specified abnormal findings of blood chemistry: Secondary | ICD-10-CM | POA: Diagnosis not present

## 2020-10-30 DIAGNOSIS — Z880 Allergy status to penicillin: Secondary | ICD-10-CM

## 2020-10-30 DIAGNOSIS — Z7984 Long term (current) use of oral hypoglycemic drugs: Secondary | ICD-10-CM

## 2020-10-30 DIAGNOSIS — E11649 Type 2 diabetes mellitus with hypoglycemia without coma: Secondary | ICD-10-CM | POA: Diagnosis present

## 2020-10-30 DIAGNOSIS — Z91018 Allergy to other foods: Secondary | ICD-10-CM

## 2020-10-30 DIAGNOSIS — I10 Essential (primary) hypertension: Secondary | ICD-10-CM | POA: Diagnosis present

## 2020-10-30 DIAGNOSIS — R569 Unspecified convulsions: Secondary | ICD-10-CM

## 2020-10-30 DIAGNOSIS — T383X5A Adverse effect of insulin and oral hypoglycemic [antidiabetic] drugs, initial encounter: Secondary | ICD-10-CM | POA: Diagnosis present

## 2020-10-30 DIAGNOSIS — Z882 Allergy status to sulfonamides status: Secondary | ICD-10-CM

## 2020-10-30 LAB — COMPREHENSIVE METABOLIC PANEL
ALT: 56 U/L — ABNORMAL HIGH (ref 0–44)
AST: 67 U/L — ABNORMAL HIGH (ref 15–41)
Albumin: 4.2 g/dL (ref 3.5–5.0)
Alkaline Phosphatase: 70 U/L (ref 38–126)
Anion gap: 10 (ref 5–15)
BUN: 7 mg/dL (ref 6–20)
CO2: 24 mmol/L (ref 22–32)
Calcium: 9.9 mg/dL (ref 8.9–10.3)
Chloride: 106 mmol/L (ref 98–111)
Creatinine, Ser: 0.81 mg/dL (ref 0.44–1.00)
GFR, Estimated: >60 mL/min (ref >60)
Glucose, Bld: 95 mg/dL (ref 70–99)
Potassium: 3.5 mmol/L (ref 3.5–5.1)
Sodium: 140 mmol/L (ref 135–145)
Total Bilirubin: 0.9 mg/dL (ref 0.3–1.2)
Total Protein: 8.1 g/dL (ref 6.5–8.1)

## 2020-10-30 LAB — CBC WITH DIFFERENTIAL/PLATELET
Abs Immature Granulocytes: 0.04 10*3/uL (ref 0.00–0.07)
Basophils Absolute: 0.1 10*3/uL (ref 0.0–0.1)
Basophils Relative: 1 %
Eosinophils Absolute: 0 10*3/uL (ref 0.0–0.5)
Eosinophils Relative: 0 %
HCT: 40.7 % (ref 36.0–46.0)
Hemoglobin: 13.5 g/dL (ref 12.0–15.0)
Immature Granulocytes: 0 %
Lymphocytes Relative: 35 %
Lymphs Abs: 3.2 10*3/uL (ref 0.7–4.0)
MCH: 30.2 pg (ref 26.0–34.0)
MCHC: 33.2 g/dL (ref 30.0–36.0)
MCV: 91.1 fL (ref 80.0–100.0)
Monocytes Absolute: 0.6 10*3/uL (ref 0.1–1.0)
Monocytes Relative: 7 %
Neutro Abs: 5.1 10*3/uL (ref 1.7–7.7)
Neutrophils Relative %: 57 %
Platelets: 403 10*3/uL — ABNORMAL HIGH (ref 150–400)
RBC: 4.47 MIL/uL (ref 3.87–5.11)
RDW: 13 % (ref 11.5–15.5)
WBC: 9.1 10*3/uL (ref 4.0–10.5)
nRBC: 0 % (ref 0.0–0.2)

## 2020-10-30 LAB — VALPROIC ACID LEVEL: Valproic Acid Lvl: 11 ug/mL — ABNORMAL LOW (ref 50.0–100.0)

## 2020-10-30 LAB — CBG MONITORING, ED: Glucose-Capillary: 90 mg/dL (ref 70–99)

## 2020-10-30 LAB — RESP PANEL BY RT-PCR (FLU A&B, COVID) ARPGX2
Influenza A by PCR: NEGATIVE
Influenza B by PCR: NEGATIVE
SARS Coronavirus 2 by RT PCR: NEGATIVE

## 2020-10-30 LAB — MAGNESIUM: Magnesium: 2.1 mg/dL (ref 1.7–2.4)

## 2020-10-30 MED ORDER — SODIUM CHLORIDE 0.9 % IV SOLN: INTRAVENOUS | Status: DC

## 2020-10-30 MED ORDER — LEVETIRACETAM IN NACL 1500 MG/100ML IV SOLN
1500.0000 mg | Freq: Once | INTRAVENOUS | Status: AC
  Administered 2020-10-31: 1500 mg via INTRAVENOUS
  Filled 2020-10-30: qty 100

## 2020-10-30 MED ORDER — SODIUM CHLORIDE 0.9 % IV BOLUS
1000.0000 mL | Freq: Once | INTRAVENOUS | Status: AC
  Administered 2020-10-30: 1000 mL via INTRAVENOUS

## 2020-10-30 MED ORDER — LORAZEPAM 2 MG/ML IJ SOLN
1.0000 mg | Freq: Once | INTRAMUSCULAR | Status: AC | PRN
  Administered 2020-10-30: 1 mg via INTRAVENOUS
  Filled 2020-10-30: qty 1

## 2020-10-30 MED ORDER — LEVETIRACETAM 500 MG PO TABS: 500.0000 mg | ORAL_TABLET | Freq: Two times a day (BID) | ORAL | Status: DC

## 2020-10-30 MED ORDER — INSULIN ASPART 100 UNIT/ML IJ SOLN
0.0000 [IU] | INTRAMUSCULAR | Status: DC
  Administered 2020-11-01: 2 [IU] via SUBCUTANEOUS
  Administered 2020-11-01: 3 [IU] via SUBCUTANEOUS
  Administered 2020-11-01 (×2): 1 [IU] via SUBCUTANEOUS
  Administered 2020-11-02: 5 [IU] via SUBCUTANEOUS
  Administered 2020-11-02: 3 [IU] via SUBCUTANEOUS
  Administered 2020-11-02: 7 [IU] via SUBCUTANEOUS
  Administered 2020-11-02 (×3): 2 [IU] via SUBCUTANEOUS
  Administered 2020-11-03: 9 [IU] via SUBCUTANEOUS
  Administered 2020-11-03: 3 [IU] via SUBCUTANEOUS
  Administered 2020-11-03: 2 [IU] via SUBCUTANEOUS
  Administered 2020-11-03: 9 [IU] via SUBCUTANEOUS
  Administered 2020-11-03: 5 [IU] via SUBCUTANEOUS

## 2020-10-30 MED ORDER — VALPROATE SODIUM 100 MG/ML IV SOLN: 1200.0000 mg | Freq: Once | INTRAVENOUS | Status: DC

## 2020-10-30 NOTE — ED Notes (Signed)
Poa at bedside updated

## 2020-10-30 NOTE — ED Triage Notes (Signed)
Pt bib [facility] for ams and possible seizures from home. Pt has MR history, and has had increased confusion over last 2 weeks, pt was seen yesterday for the same. EMS noted focal seizure activity w/ L arm tembling. Pt is groaning but follows commands. vss

## 2020-10-30 NOTE — ED Notes (Signed)
Neuro at bedside.

## 2020-10-30 NOTE — H&P (Signed)
BRYNLEI KLAUSNER QZE:[id number] DOB: Dec 18, 1965 DOA: 10/30/2020    PCP: Mirna Mires, MD   Outpatient Specialists:  NONE    Patient arrived to ER on 10/30/20 at 1918 Referred by Attending Jacalyn Lefevre, MD   Patient coming from: home Lives  With family    Chief Complaint:   Chief Complaint  Patient presents with  . Seizures  . Altered Mental Status    HPI: TANESSA TIDD is a 55 y.o. female with medical history significant of intellectual disability, DM 2, HTN bipolar disorder    Presented with possible seizure, increased confusion over past 2 wks L arm trembling.at baseline able to talk  And self care,  Used to live in group home now with sister, was on  a lot of meds and since was living with sister was taken off. Patient has been on Depakote for years although she never has history of seizure disorder and was taking for behavioral issues. Apparently patient has done well for months but then for the past week developed seizure-like activity.  noticed Saturday when patient presented with echolalia-type symptoms and periods of staring off for 1 wk No hx of CVA,       Initial COVID TEST  NEGATIVE   Lab Results  Component Value Date   SARSCOV2NAA NEGATIVE 10/28/2020    Regarding pertinent Chronic problems:       DM 2 - supposed to be on insulin unsure if she is taking Lab Results  Component Value Date   HGBA1C (H) 10/15/2007    14.5 (NOTE)   The ADA recommends the following therapeutic goals for glycemic   control related to Hgb A1C measurement:   Goal of Therapy:   < 7.0% Hgb A1C   Action Suggested:  > 8.0% Hgb A1C   Ref:  Diabetes Care, 22, Suppl. 1, 1999     obesity-   BMI Readings from Last 1 Encounters:  10/30/20 34.57 kg/m     While in ER: Patient continued to have seizure-like activity Was able to be treated with Ativan Neurology was consulted patient was loaded with Keppra    ED Triage Vitals  Enc Vitals Group     BP 10/30/20 1941 (!) 154/71      Pulse Rate 10/30/20 1941 80     Resp 10/30/20 1941 18     Temp 10/30/20 1941 98.3 F (36.8 C)     Temp Source 10/30/20 1941 Oral     SpO2 10/30/20 1936 99 %     Weight 10/30/20 1942 182 lb 15.7 oz (83 kg)     Height 10/30/20 1942 5\' 1"  (1.549 m)     Head Circumference --      Peak Flow --      Pain Score --      Pain Loc --      Pain Edu? --      Excl. in GC? --   TMAX(24)@     _________________________________________ Significant initial  Findings: Abnormal Labs Reviewed  CBC WITH DIFFERENTIAL/PLATELET - Abnormal; Notable for the following components:      Result Value   Platelets 403 (*)    All other components within normal limits  COMPREHENSIVE METABOLIC PANEL - Abnormal; Notable for the following components:   AST 67 (*)    ALT 56 (*)    All other components within normal limits  VALPROIC ACID LEVEL - Abnormal; Notable for the following components:   Valproic Acid Lvl 11 (*)  All other components within normal limits   ____________________________________________ Ordered CT HEAD   NON acute  CXR -  NON acute   ECG: Ordered Personally reviewed by me showing: HR : 83 Rhythm:  NSR,   , no evidence of ischemic changes QTC 463   The recent clinical data is shown below. Vitals:   10/30/20 2145 10/30/20 2200 10/30/20 2215 10/30/20 2245  BP: 126/65 127/65 122/66 129/74  Pulse: 89 84 85 88  Resp: Temp:      TempSrc:      SpO2: 98% 99% 96% 98%  Weight:      Height:        WBC     Component Value Date/Time   WBC 9.1 10/30/2020 1925   LYMPHSABS 3.2 10/30/2020 1925   MONOABS 0.6 10/30/2020 1925   EOSABS 0.0 10/30/2020 1925   BASOSABS 0.1 10/30/2020 1925     UA   no evidence of UTI      Urine analysis:    Component Value Date/Time   COLORURINE YELLOW 10/28/2020 1944   APPEARANCEUR CLEAR 10/28/2020 1944   LABSPEC 1.009 10/28/2020 1944   PHURINE 5.0 10/28/2020 1944   GLUCOSEU NEGATIVE 10/28/2020 1944   HGBUR NEGATIVE 10/28/2020 1944    BILIRUBINUR NEGATIVE 10/28/2020 1944   KETONESUR NEGATIVE 10/28/2020 1944   PROTEINUR NEGATIVE 10/28/2020 1944   UROBILINOGEN 0.2 04/27/2010 1438   NITRITE NEGATIVE 10/28/2020 1944   LEUKOCYTESUR NEGATIVE 10/28/2020 1944    Results for orders placed or performed during the hospital encounter of 10/30/20  Resp Panel by RT-PCR (Flu A&B, Covid) Nasopharyngeal Swab     Status: None   Collection Time: 10/30/20 10:22 PM   Specimen: Nasopharyngeal Swab; Nasopharyngeal(NP) swabs in vial transport medium  Result Value Ref Range Status   SARS Coronavirus 2 by RT PCR NEGATIVE NEGATIVE Final         Influenza A by PCR NEGATIVE NEGATIVE Final   Influenza B by PCR NEGATIVE NEGATIVE Final          _______________________________________________________ ER Provider Called: Neurology    Dr. Amada Jupiter They Recommend admit to medicine    SEEN in ER _______________________________________________ Hospitalist was called for admission for seizure-like activity  The following Work up has been ordered so far:  Orders Placed This Encounter  Procedures  . Resp Panel by RT-PCR (Flu A&B, Covid) Nasopharyngeal Swab  . CT HEAD WO CONTRAST  . CBC WITH DIFFERENTIAL  . Comprehensive metabolic panel  . Magnesium  . Urinalysis, Routine w reflex microscopic  . Urine rapid drug screen (hosp performed)  . Valproic acid level  . Cardiac monitoring  . Initiate Carrier Fluid Protocol  . Neuro checks  . If O2 sat If O2 Sat <94% administer O2 at 2 liters/minute via nasal canula  . Consult to hospitalist  . Pulse oximetry, continuous  . CBG monitoring, ED  . ED EKG  . EKG 12-Lead  . Seizure precautions     Following Medications were ordered in ER: Medications  sodium chloride 0.9 % bolus 1,000 mL (1,000 mLs Intravenous New Bag/Given 10/30/20 2008)    And  0.9 %  sodium chloride infusion ( Intravenous New Bag/Given 10/30/20 2101)  LORazepam (ATIVAN) injection 1 mg (1 mg Intravenous Given 10/30/20 2009)         Consult Orders  (From admission, onward)         Start     Ordered   10/30/20 2222  Consult to hospitalist  Paged,  Sianna  Once       Provider:  (Not yet assigned)  Question Answer Comment  Place call to: [facility]   Reason for Consult Admit      10/30/20 2221           OTHER Significant initial  Findings:  labs showing:    Recent Labs  Lab 10/28/20 2013 10/30/20 1925  NA 135 140  K 3.5 3.5  CO2 27 24  GLUCOSE 152* 95  BUN 14 7  CREATININE 0.77 0.81  CALCIUM 9.6 9.9  MG  --  2.1    Cr stable,   Lab Results  Component Value Date   CREATININE 0.81 10/30/2020   CREATININE 0.77 10/28/2020   CREATININE 0.90 02/10/2013    Recent Labs  Lab 10/28/20 2013 10/30/20 1925  AST 59* 67*  ALT 58* 56*  ALKPHOS 68 70  BILITOT 0.7 0.9  PROT 8.0 8.1  ALBUMIN 4.5 4.2   Lab Results  Component Value Date   CALCIUM 9.9 10/30/2020          Plt: Lab Results  Component Value Date   PLT 403 (H) 10/30/2020       Recent Labs  Lab 10/28/20 2013 10/30/20 1925  WBC 9.1 9.1  NEUTROABS 4.6 5.1  HGB 12.2 13.5  HCT 36.4 40.7  MCV 91.0 91.1  PLT 339 403*    HG/HCT  Stable,     Component Value Date/Time   HGB 13.5 10/30/2020 1925   HCT 40.7 10/30/2020 1925   MCV 91.1 10/30/2020 1925    Recent Labs  Lab 10/28/20 2013  AMMONIA 33     Cardiac Panel (last 3 results) No results for input(s): CKTOTAL, CKMB, TROPONINI, RELINDX in the last 72 hours.    DM  labs:  HbA1C: Recent Labs    10/30/20 2308  HGBA1C 9.0*       CBG (last 3)  Recent Labs    10/30/20 1958 10/31/20 0003 10/31/20 0105  GLUCAP 90 65* 102*     Cultures:    Component Value Date/Time   SDES URINE, RANDOM 10/15/2007 2303   SPECREQUEST  10/15/2007 2303    new onset diabetes, hyperglycemia unknown IMMUNE:COMPRM UT SYMPT:POS   CULT  10/15/2007 2303    GROUP B STREP(S.AGALACTIAE)ISOLATED Note: TESTING AGAINST S. AGALACTIAE NOT ROUTINELY PERFORMED DUE TO PREDICTABILITY OF  AMP/PEN/VAN SUSCEPTIBILITY. YEAST   REPTSTATUS 10/17/2007 FINAL 10/15/2007 2303     Radiological Exams on Admission: CT HEAD WO CONTRAST  Result Date: 10/30/2020 CLINICAL DATA:  Seizure EXAM: CT HEAD WITHOUT CONTRAST TECHNIQUE: Contiguous axial images were obtained from the base of the skull through the vertex without intravenous contrast. COMPARISON:  Oct 28, 2020. FINDINGS: Brain: Ventricles and sulci are normal in size and configuration. There is no intracranial mass, hemorrhage, extra-axial fluid collection, or midline shift. Brain parenchyma appears unremarkable. No acute infarct is appreciable. Vascular: No hyperdense vessel.  No evident vascular calcification. Skull: Bony calvarium appears intact. Sinuses/Orbits: There is a retention cyst in the inferior right maxillary antrum. Other paranasal sinuses are clear. Orbits appear symmetric bilaterally. Other: Mastoid air cells are clear. IMPRESSION: Normal appearing brain parenchyma. No mass or hemorrhage. No evident acute infarct. Retention cyst inferior right maxillary antrum. Electronically Signed   By: Bretta Bang III M.D.   On: 10/30/2020 21:56   _______________________________________________________________________________________________________ Latest  Blood pressure 129/74, pulse 88, temperature 98.3 F (36.8 C), temperature source Oral, resp. rate 16, height 5\' 1"  (1.549 m), weight 83 kg, SpO2  98 %.   Review of Systems:    Pertinent positives include: confusion  Constitutional:  No weight loss, night sweats, Fevers, chills, fatigue, weight loss  HEENT:  No headaches, Difficulty swallowing,Tooth/dental problems,Sore throat,  No sneezing, itching, ear ache, nasal congestion, post nasal drip,  Cardio-vascular:  No chest pain, Orthopnea, PND, anasarca, dizziness, palpitations.no Bilateral lower extremity swelling  GI:  No heartburn, indigestion, abdominal pain, nausea, vomiting, diarrhea, change in bowel habits, loss of  appetite, melena, blood in stool, hematemesis Resp:  no shortness of breath at rest. No dyspnea on exertion, No excess mucus, no productive cough, No non-productive cough, No coughing up of blood.No change in color of mucus.No wheezing. Skin:  no rash or lesions. No jaundice GU:  no dysuria, change in color of urine, no urgency or frequency. No straining to urinate.  No flank pain.  Musculoskeletal:  No joint pain or no joint swelling. No decreased range of motion. No back pain.  Psych:  No change in mood or affect. No depression or anxiety. No memory loss.  Neuro: no localizing neurological complaints, no tingling, no weakness, no double vision, no gait abnormality, no slurred speech, no   All systems reviewed and apart from HOPI all are negative _______________________________________________________________________________________________ Past Medical History:   Past Medical History:  Diagnosis Date  . Diabetes mellitus without complication (HCC)   . Hypertension   . Immune deficiency disorder (HCC)   . Mild mental retardation       History reviewed. No pertinent surgical history.  Social History:  Ambulatory   independently       reports that she has never smoked. She has never used smokeless tobacco. She reports that she does not drink alcohol and does not use drugs.  Family History:   Family History  Problem Relation Age of Onset  . Hypertension Other    ______________________________________________________________________________________________ Allergies: Allergies  Allergen Reactions  . Penicillins   . Sulfa Antibiotics   . Tomato      Prior to Admission medications   Medication Sig Start Date End Date Taking? Authorizing Provider  ALPRAZolam Prudy Feeler) 0.5 MG tablet Take 0.5 mg by mouth 2 (two) times daily as needed for anxiety.    [provider]  benztropine (COGENTIN) 0.5 MG tablet Take 0.5 mg by mouth 2 (two) times daily.    [provider]  cyclobenzaprine (FLEXERIL) 10 MG tablet Take 10 mg by mouth 2 (two) times daily.    [provider]  divalproex (DEPAKOTE ER) 500 MG 24 hr tablet Take 1,000 mg by mouth at bedtime.    [provider]  glimepiride (AMARYL) 2 MG tablet Take 2 mg by mouth daily before breakfast.    [provider]  HYDROcodone-acetaminophen (NORCO/VICODIN) 5-325 MG per tablet Take 1 tablet by mouth every 4 (four) hours as needed for pain.    [provider]  ibuprofen (ADVIL,MOTRIN) 800 MG tablet Take 800 mg by mouth 2 (two) times daily as needed for pain.    [provider]  insulin aspart (NOVOLOG) 100 UNIT/ML injection Inject 1-7 Units into the skin 3 (three) times daily as needed for high blood sugar (Per sliding scale: 120-150=1 unit, 150-200=2 units, 201-250=4 units, 251-300=6 units).    [provider]  loratadine (CLARITIN) 10 MG tablet Take 10 mg by mouth daily.    [provider]  metFORMIN (GLUCOPHAGE) 500 MG tablet Take 500 mg by mouth 2 (two) times daily with a meal.    [provider]  risperiDONE (RISPERDAL) 2 MG tablet Take 2 mg by mouth at bedtime.    [provider]  sertraline (ZOLOFT) 100 MG tablet Take 50 mg by mouth at bedtime.     [provider]  simvastatin (ZOCOR) 20 MG tablet Take 20 mg by mouth at bedtime.    [provider]  traZODone (DESYREL) 100 MG tablet Take 50-100 mg by mouth at bedtime as needed for sleep.     [provider]  valACYclovir (VALTREX) 500 MG tablet Take 500 mg by mouth at bedtime.    [provider]    ___________________________________________________________________________________________________ Physical Exam: Vitals with BMI 10/30/2020 10/30/2020 10/30/2020  Height - - -  Weight - - -  BMI - - -  Systolic 129 122 161  Diastolic 74 66 65  Pulse 88 85 84    1. General:  in No  Acute distress    Chronically ill   -appearing 2.  Psychological: Alert intermittently following commands but then falls asleep after had a seizure-like activity 3. Head/ENT:     Dry Mucous Membranes                          Head Non traumatic, neck supple                           Poor Dentition 4. SKIN:   decreased Skin turgor,  Skin clean Dry and intact no rash 5. Heart: Regular rate and rhythm no  Murmur, no Rub or gallop 6. Lungs: no wheezes or crackles   7. Abdomen: Soft,  non-tender, Non distended bowel sounds present 8. Lower extremities: no clubbing, cyanosis, no  edema 9. Neurologically Grossly intact, moving all 4 extremities equally   10. MSK: Normal range of motion    Chart has been reviewed  ______________________________________________________________________________________________  Assessment/Plan  55 y.o. female with medical history significant of intellectual disability, DM 2, HTN bipolar disorder  Admitted for recurent seizure activity  Present on Admission: . Acute encephalopathy -most likely in the setting of postictal state.  Monitor in stepdown.  Appreciate neurology consult  Seizure activity.  Appreciate neurology consult -loaded with Keppra EEG when able  . DM (diabetes mellitus), type 2 with complications (HCC) -    Order Sensitive  SSI   - hold home meds  -  check TSH and HgA1C  - Hold by mouth medications    . Hypoglycemia -hold insulin continue to follow serial  . Elevated LFTs -obtain hepatitis serology rehydrate and follow LFTs   Other plan as per orders.  DVT prophylaxis:  SCD       Code Status:    Code Status: Prior FULL CODE     Family Communication:   Family not at  Bedside    Disposition Plan:     To home once workup is complete and patient is stable   Following barriers for discharge:                            Electrolytes corrected                                                           Pain  controlled with PO medications                                                           Will need consultants to evaluate patient prior to discharge                       Would benefit from PT/OT eval prior to DC  Ordered                   Swallow eval - SLP ordered                   Diabetes care coordinator                   Transition of care consulted                   Nutrition    consulted                                     Behavioral health  consulted                    Consults called: Neurology   Admission status:  ED Disposition    ED Disposition Condition Comment   Admit  [id number]  Level of Care: Progressive [102]  Admit to Progressive based on following criteria: NEUROLOGICAL AND NEUROSURGICAL complex patients with significant risk of instability, who do not meet ICU criteria, yet require close observation or frequent assessment (< / = every 2 - 4 hours) with medical / nursing intervention.  Covid Evaluation: Asymptomatic Screening Protocol (No Symptoms)  Diagnosis: Acute encephalopathy [[id number]][[id number]]  Admitting Physician: Therisa DoyneUTOVA, Gualberto Wahlen [[id number]]  Attending Physician: Therisa DoyneUTOVA, Gianno Volner [[id number]]       Obs   Level of care   SDU tele indefinitely please discontinue once patient no longer qualifies COVID-19 Labs    Lab Results  Component Value Date   SARSCOV2NAA NEGATIVE 10/30/2020     Precautions: admitted as  Covid Negative    PPE: Used by the provider:   N95  eye Goggles,  Gloves     Sujay Grundman 10/31/2020, 1:38 AM    [facility]     after 2 AM please page floor coverage PA If 7AM-7PM, please contact the day team taking care of the patient using [email or web address]   Patient was evaluated in the context of the global COVID-19 pandemic, which necessitated consideration that the patient might be at risk for infection with the SARS-CoV-2 virus that causes COVID-19. Institutional protocols and algorithms that pertain to the evaluation of patients at risk for COVID-19 are in a state of rapid  change based on information released by regulatory bodies including the CDC and federal and state organizations. These policies and algorithms were followed during the patient's care.

## 2020-10-30 NOTE — Consult Note (Signed)
Neurology Consultation Reason for Consult:  Seizures Referring Physician: Para Skeans  CC: Seizures  History is obtained from: Referring provider, patient sister  HPI: [name] is a 55 y.o. female with a history of mild MR, diabetes, hypertension who presents with seizures.  She does not have a known history of seizures to the sister, but has been managed on Depakote for many years due to behavioral issues.  She stopped this along with several other medications in August of this year.  About a week ago, the sister noticed that she was having intermittent staring spells where she began to "space out." It would last for a few seconds to a minute or two.  She describes it as a sudden behavioral arrest where she would stop in mid speech and stared fixed head.  Over the past day, she has not had much speech output at all.  She has intermittent twitching of her right face.  When she was brought into the emergency department a focal seizure was witnessed by the ED physician and was aborted with Ativan.  ROS: Unable to obtain due to altered mental status.   Past Medical History:  Diagnosis Date  . Diabetes mellitus without complication (HCC)   . Hypertension   . Immune deficiency disorder (HCC)   . Mild mental retardation      History reviewed. No pertinent family history.   Social History:  reports that she has never smoked. She has never used smokeless tobacco. She reports that she does not drink alcohol and does not use drugs.   Exam: Current vital signs: BP 129/74   Pulse 88   Temp 98.3 F (36.8 C) (Oral)   Resp 16   Ht 5\' 1"  (1.549 m)   Wt 83 kg   SpO2 98%   BMI 34.57 kg/m  Vital signs in last 24 hours: Temp:  [98.3 F (36.8 C)] 98.3 F (36.8 C) (05/14 1941) Pulse Rate:  [80-90] 88 (05/14 2245) Resp:  [15-19] 16 (05/14 2245) BP: (122-154)/(65-90) 129/74 (05/14 2245) SpO2:  [96 %-99 %] 98 % (05/14 2245) Weight:  [83 kg] 83 kg (05/14 1942)   Physical Exam   Constitutional: Appears well-developed and well-nourished.  Psych: Affect appropriate to situation Eyes: No scleral injection HENT: No OP obstruction MSK: no joint deformities.  Cardiovascular: Normal rate and regular rhythm.  Respiratory: Effort normal, non-labored breathing GI: Soft.  No distension. There is no tenderness.  Skin: WDI  Neuro: Mental Status: Patient is somnolent but awake. She reaches out to take my hand in handshake. She follows command to squeeze fingers, but does not reliably follow commands.  Cranial Nerves: II: blinks to threat bilaterally. Pupils are equal, round, and reactive to light.   III,IV, VI: EOMI without ptosis or diploplia.  V: Facial sensation is symmetric to temperature VII: Facial movement is symmetric.  VIII: hearing is intact to voice X: Uvula elevates symmetrically XI: Shoulder shrug is symmetric. XII: tongue is midline without atrophy or fasciculations.  Motor: Withdraws to no noxious stimulation bilaterally Sensory: As above  Deep Tendon Reflexes: 2+ and symmetric in the biceps and patellae.  Cerebellar: Does not perform   I have reviewed labs in epic and the results pertinent to this consultation are: VPA 11 (though I am not certain why there is any at all given that she has not had Depakote apparently in quite some time) Magnesium 2.1 Creatinine 0.8  AST 67 ALT 56  I have reviewed the images obtained: CT head-unremarkable  Impression: 55 year old female with mild mental retardation who presents with new onset seizures.  Given that she was on Depakote for many many years, I am not certain whether this truly represents new onset seizures or not.  In any case, given that she is having frequent recurrent seizures without complete return to baseline I think that it essentially amounts to status epilepticus .  Given the duration that she has been symptomatic, I would not favor aggressively pursuing for suppression, etc. but I would  favor starting her on Keppra after benzodiazepine administration and monitoring for effect.  Given the subtle nature, I think that continuous EEG starting in the morning would likely be beneficial.  Recommendations: 1) Keppra 2.5 g x 1 followed by 1 g twice daily 2) Ativan for any further seizures 3) EEG in the morning 4) MRI brain 5) neurology to follow   Ritta Slot, MD [facility] [phone]  If 7pm- 7am, please page neurology on call as listed in AMION.

## 2020-10-30 NOTE — ED Provider Notes (Addendum)
[facility] EMERGENCY DEPARTMENT Provider Note   CSN: [id number] Arrival date & time: 10/30/20  1918     History Chief Complaint  Patient presents with  . Seizures  . Altered Mental Status    [name] is a 55 y.o. female.  Pt presents to the ED today with AMS.  The pt was seen here on 5/12 for AMS.  It was thought to be secondary to her psych issues.  Then, her sister Melina Modena) said pt can normally talk and take care of herself, but she has not been doing this.  Today, she had some shaking at home.  When pt arrived, she had a staring spell and left arm twitching.  Pt is unable to give any hx.  Pt's sister said she was in a group home for 14 years before coming to live with her.  The pt was on a lot of sedating meds.  Since she's been with the sister, she has not been on the psych meds.        Past Medical History:  Diagnosis Date  . Diabetes mellitus without complication (HCC)   . Hypertension   . Immune deficiency disorder (HCC)   . Mild mental retardation     Patient Active Problem List   Diagnosis Date Noted  . Change in mental status 10/29/2020  . Intellectual disability   . Confusion     History reviewed. No pertinent surgical history.   OB History   No obstetric history on file.     History reviewed. No pertinent family history.  Social History   Tobacco Use  . Smoking status: Never Smoker  . Smokeless tobacco: Never Used  Substance Use Topics  . Alcohol use: No  . Drug use: Never    Home Medications Prior to Admission medications   Medication Sig Start Date End Date Taking? Authorizing Provider  ALPRAZolam Prudy Feeler) 0.5 MG tablet Take 0.5 mg by mouth 2 (two) times daily as needed for anxiety.    [provider]  benztropine (COGENTIN) 0.5 MG tablet Take 0.5 mg by mouth 2 (two) times daily.    [provider]  cyclobenzaprine (FLEXERIL) 10 MG tablet Take 10 mg by mouth 2 (two) times daily.    [provider]  divalproex (DEPAKOTE ER) 500 MG 24 hr tablet Take 1,000 mg by mouth at bedtime.    [provider]  glimepiride (AMARYL) 2 MG tablet Take 2 mg by mouth daily before breakfast.    [provider]  HYDROcodone-acetaminophen (NORCO/VICODIN) 5-325 MG per tablet Take 1 tablet by mouth every 4 (four) hours as needed for pain.    [provider]  ibuprofen (ADVIL,MOTRIN) 800 MG tablet Take 800 mg by mouth 2 (two) times daily as needed for pain.    [provider]  insulin aspart (NOVOLOG) 100 UNIT/ML injection Inject 1-7 Units into the skin 3 (three) times daily as needed for high blood sugar (Per sliding scale: 120-150=1 unit, 150-200=2 units, 201-250=4 units, 251-300=6 units).    [provider]  loratadine (CLARITIN) 10 MG tablet Take 10 mg by mouth daily.    [provider]  metFORMIN (GLUCOPHAGE) 500 MG tablet Take 500 mg by mouth 2 (two) times daily with a meal.    [provider]  risperiDONE (RISPERDAL) 2 MG tablet Take 2 mg by mouth at bedtime.    [provider]  sertraline (ZOLOFT) 100 MG tablet Take 50 mg by mouth at bedtime.  [provider]  simvastatin (ZOCOR) 20 MG tablet Take 20 mg by mouth at bedtime.    [provider]  traZODone (DESYREL) 100 MG tablet Take 50-100 mg by mouth at bedtime as needed for sleep.     [provider]  valACYclovir (VALTREX) 500 MG tablet Take 500 mg by mouth at bedtime.    [provider]    Allergies    Penicillins, Sulfa antibiotics, and Tomato  Review of Systems   Review of Systems  Unable to perform ROS: Mental status change  All other systems reviewed and are negative.   Physical Exam Updated Vital Signs BP 127/65   Pulse 84   Temp 98.3 F (36.8 C) (Oral)   Resp 15   Ht 5\' 1"  (1.549 m)   Wt 83 kg   SpO2 99%   BMI 34.57 kg/m   Physical Exam Vitals and nursing note reviewed.  Constitutional:      Appearance:  Normal appearance.  HENT:     Head: Normocephalic and atraumatic.     Right Ear: External ear normal.     Left Ear: External ear normal.     Nose: Nose normal.     Mouth/Throat:     Mouth: Mucous membranes are moist.     Pharynx: Oropharynx is clear.  Eyes:     Extraocular Movements: Extraocular movements intact.     Conjunctiva/sclera: Conjunctivae normal.     Pupils: Pupils are equal, round, and reactive to light.  Cardiovascular:     Rate and Rhythm: Normal rate and regular rhythm.     Pulses: Normal pulses.     Heart sounds: Normal heart sounds.  Pulmonary:     Effort: Pulmonary effort is normal.     Breath sounds: Normal breath sounds.  Abdominal:     General: Abdomen is flat. Bowel sounds are normal.     Palpations: Abdomen is soft.  Musculoskeletal:        General: Normal range of motion.     Cervical back: Normal range of motion and neck supple.  Skin:    General: Skin is warm.     Capillary Refill: Capillary refill takes less than 2 seconds.  Neurological:     Mental Status: She is alert.     Comments: Staring spell for a few seconds with left arm twitching.  Pt nonverbal now.     ED Results / Procedures / Treatments   Labs (all labs ordered are listed, but only abnormal results are displayed) Labs Reviewed  CBC WITH DIFFERENTIAL/PLATELET - Abnormal; Notable for the following components:      Result Value   Platelets 403 (*)    All other components within normal limits  COMPREHENSIVE METABOLIC PANEL - Abnormal; Notable for the following components:   AST 67 (*)    ALT 56 (*)    All other components within normal limits  VALPROIC ACID LEVEL - Abnormal; Notable for the following components:   Valproic Acid Lvl 11 (*)    All other components within normal limits  RESP PANEL BY RT-PCR (FLU A&B, COVID) ARPGX2  MAGNESIUM  URINALYSIS, ROUTINE W REFLEX MICROSCOPIC  RAPID URINE DRUG SCREEN, HOSP PERFORMED  CBG MONITORING, ED    EKG None  Radiology CT HEAD  WO CONTRAST  Result Date: 10/30/2020 CLINICAL DATA:  Seizure EXAM: CT HEAD WITHOUT CONTRAST TECHNIQUE: Contiguous axial images were obtained from the base of the skull through the vertex without intravenous contrast. COMPARISON:  Oct 28, 2020. FINDINGS:  Brain: Ventricles and sulci are normal in size and configuration. There is no intracranial mass, hemorrhage, extra-axial fluid collection, or midline shift. Brain parenchyma appears unremarkable. No acute infarct is appreciable. Vascular: No hyperdense vessel.  No evident vascular calcification. Skull: Bony calvarium appears intact. Sinuses/Orbits: There is a retention cyst in the inferior right maxillary antrum. Other paranasal sinuses are clear. Orbits appear symmetric bilaterally. Other: Mastoid air cells are clear. IMPRESSION: Normal appearing brain parenchyma. No mass or hemorrhage. No evident acute infarct. Retention cyst inferior right maxillary antrum. Electronically Signed   By: Bretta Bang III M.D.   On: 10/30/2020 21:56    Procedures Procedures   Medications Ordered in ED Medications  sodium chloride 0.9 % bolus 1,000 mL (1,000 mLs Intravenous New Bag/Given 10/30/20 2008)    And  0.9 %  sodium chloride infusion ( Intravenous New Bag/Given 10/30/20 2101)  LORazepam (ATIVAN) injection 1 mg (1 mg Intravenous Given 10/30/20 2009)    ED Course  I have reviewed the triage vital signs and the nursing notes.  Pertinent labs & imaging results that were available during my care of the patient were reviewed by me and considered in my medical decision making (see chart for details).    MDM Rules/Calculators/A&P                          Pt given 1 mg of ativan iv and she has not had any more twitching.  Pt d/w Dr. Amada Jupiter (neurology) who will see pt in consult.  He recommends loading with keppra and admission for obs as she is not back to her normal self.  Pt d/w Dr. Adela Glimpse ([facility]) for admission.   Final Clinical Impression(s) /  ED Diagnoses Final diagnoses:  Seizures [facility])    Rx / DC Orders ED Discharge Orders    None       Jacalyn Lefevre, MD 10/30/20 2315    Jacalyn Lefevre, MD 10/30/20 2320

## 2020-10-30 NOTE — ED Notes (Signed)
Patient transported to CT 

## 2020-10-30 NOTE — ED Notes (Signed)
Pt placed on purewick 

## 2020-10-31 ENCOUNTER — Inpatient Hospital Stay (HOSPITAL_COMMUNITY): Payer: Medicaid Other

## 2020-10-31 ENCOUNTER — Observation Stay (HOSPITAL_COMMUNITY): Payer: Medicaid Other

## 2020-10-31 ENCOUNTER — Encounter (HOSPITAL_COMMUNITY): Payer: Self-pay | Admitting: Internal Medicine

## 2020-10-31 DIAGNOSIS — Z880 Allergy status to penicillin: Secondary | ICD-10-CM | POA: Diagnosis not present

## 2020-10-31 DIAGNOSIS — F70 Mild intellectual disabilities: Secondary | ICD-10-CM | POA: Diagnosis present

## 2020-10-31 DIAGNOSIS — Z882 Allergy status to sulfonamides status: Secondary | ICD-10-CM | POA: Diagnosis not present

## 2020-10-31 DIAGNOSIS — E118 Type 2 diabetes mellitus with unspecified complications: Secondary | ICD-10-CM | POA: Diagnosis not present

## 2020-10-31 DIAGNOSIS — Z8249 Family history of ischemic heart disease and other diseases of the circulatory system: Secondary | ICD-10-CM | POA: Diagnosis not present

## 2020-10-31 DIAGNOSIS — G40909 Epilepsy, unspecified, not intractable, without status epilepticus: Secondary | ICD-10-CM | POA: Diagnosis present

## 2020-10-31 DIAGNOSIS — T383X5A Adverse effect of insulin and oral hypoglycemic [antidiabetic] drugs, initial encounter: Secondary | ICD-10-CM | POA: Diagnosis present

## 2020-10-31 DIAGNOSIS — F319 Bipolar disorder, unspecified: Secondary | ICD-10-CM | POA: Diagnosis present

## 2020-10-31 DIAGNOSIS — E11649 Type 2 diabetes mellitus with hypoglycemia without coma: Secondary | ICD-10-CM | POA: Diagnosis present

## 2020-10-31 DIAGNOSIS — E785 Hyperlipidemia, unspecified: Secondary | ICD-10-CM | POA: Diagnosis present

## 2020-10-31 DIAGNOSIS — G9340 Encephalopathy, unspecified: Secondary | ICD-10-CM | POA: Diagnosis not present

## 2020-10-31 DIAGNOSIS — Z79899 Other long term (current) drug therapy: Secondary | ICD-10-CM | POA: Diagnosis not present

## 2020-10-31 DIAGNOSIS — R7989 Other specified abnormal findings of blood chemistry: Secondary | ICD-10-CM | POA: Diagnosis present

## 2020-10-31 DIAGNOSIS — Z91018 Allergy to other foods: Secondary | ICD-10-CM | POA: Diagnosis not present

## 2020-10-31 DIAGNOSIS — Z20822 Contact with and (suspected) exposure to covid-19: Secondary | ICD-10-CM | POA: Diagnosis present

## 2020-10-31 DIAGNOSIS — R569 Unspecified convulsions: Secondary | ICD-10-CM

## 2020-10-31 DIAGNOSIS — I10 Essential (primary) hypertension: Secondary | ICD-10-CM | POA: Diagnosis present

## 2020-10-31 DIAGNOSIS — Z794 Long term (current) use of insulin: Secondary | ICD-10-CM | POA: Diagnosis not present

## 2020-10-31 DIAGNOSIS — E162 Hypoglycemia, unspecified: Secondary | ICD-10-CM | POA: Diagnosis present

## 2020-10-31 DIAGNOSIS — Z7984 Long term (current) use of oral hypoglycemic drugs: Secondary | ICD-10-CM | POA: Diagnosis not present

## 2020-10-31 LAB — COMPREHENSIVE METABOLIC PANEL
ALT: 52 U/L — ABNORMAL HIGH (ref 0–44)
AST: 63 U/L — ABNORMAL HIGH (ref 15–41)
Albumin: 3.9 g/dL (ref 3.5–5.0)
Alkaline Phosphatase: 65 U/L (ref 38–126)
Anion gap: 10 (ref 5–15)
BUN: 6 mg/dL (ref 6–20)
CO2: 24 mmol/L (ref 22–32)
Calcium: 9.3 mg/dL (ref 8.9–10.3)
Chloride: 105 mmol/L (ref 98–111)
Creatinine, Ser: 0.65 mg/dL (ref 0.44–1.00)
GFR, Estimated: >60 mL/min (ref >60)
Glucose, Bld: 119 mg/dL — ABNORMAL HIGH (ref 70–99)
Potassium: 3.8 mmol/L (ref 3.5–5.1)
Sodium: 139 mmol/L (ref 135–145)
Total Bilirubin: 1 mg/dL (ref 0.3–1.2)
Total Protein: 7.3 g/dL (ref 6.5–8.1)

## 2020-10-31 LAB — CBG MONITORING, ED
Glucose-Capillary: 102 mg/dL — ABNORMAL HIGH (ref 70–99)
Glucose-Capillary: 123 mg/dL — ABNORMAL HIGH (ref 70–99)
Glucose-Capillary: 123 mg/dL — ABNORMAL HIGH (ref 70–99)
Glucose-Capillary: 132 mg/dL — ABNORMAL HIGH (ref 70–99)
Glucose-Capillary: 143 mg/dL — ABNORMAL HIGH (ref 70–99)
Glucose-Capillary: 65 mg/dL — ABNORMAL LOW (ref 70–99)
Glucose-Capillary: 66 mg/dL — ABNORMAL LOW (ref 70–99)
Glucose-Capillary: 74 mg/dL (ref 70–99)
Glucose-Capillary: 79 mg/dL (ref 70–99)
Glucose-Capillary: 84 mg/dL (ref 70–99)
Glucose-Capillary: 97 mg/dL (ref 70–99)

## 2020-10-31 LAB — HEMOGLOBIN A1C
Hgb A1c MFr Bld: 9 % — ABNORMAL HIGH (ref 4.8–5.6)
Mean Plasma Glucose: 211.6 mg/dL

## 2020-10-31 LAB — HEPATITIS PANEL, ACUTE
HCV Ab: NONREACTIVE
Hep A IgM: NONREACTIVE
Hep B C IgM: NONREACTIVE
Hepatitis B Surface Ag: NONREACTIVE

## 2020-10-31 LAB — MAGNESIUM: Magnesium: 2.2 mg/dL (ref 1.7–2.4)

## 2020-10-31 LAB — CBC WITH DIFFERENTIAL/PLATELET
Abs Immature Granulocytes: 0.03 10*3/uL (ref 0.00–0.07)
Basophils Absolute: 0.1 10*3/uL (ref 0.0–0.1)
Basophils Relative: 1 %
Eosinophils Absolute: 0.1 10*3/uL (ref 0.0–0.5)
Eosinophils Relative: 1 %
HCT: 43 % (ref 36.0–46.0)
Hemoglobin: 14.3 g/dL (ref 12.0–15.0)
Immature Granulocytes: 0 %
Lymphocytes Relative: 53 %
Lymphs Abs: 4.4 10*3/uL — ABNORMAL HIGH (ref 0.7–4.0)
MCH: 30.4 pg (ref 26.0–34.0)
MCHC: 33.3 g/dL (ref 30.0–36.0)
MCV: 91.3 fL (ref 80.0–100.0)
Monocytes Absolute: 0.5 10*3/uL (ref 0.1–1.0)
Monocytes Relative: 6 %
Neutro Abs: 3.2 10*3/uL (ref 1.7–7.7)
Neutrophils Relative %: 39 %
Platelets: 398 10*3/uL (ref 150–400)
RBC: 4.71 MIL/uL (ref 3.87–5.11)
RDW: 13.2 % (ref 11.5–15.5)
WBC: 8.3 10*3/uL (ref 4.0–10.5)
nRBC: 0 % (ref 0.0–0.2)

## 2020-10-31 LAB — URINALYSIS, ROUTINE W REFLEX MICROSCOPIC
Bacteria, UA: NONE SEEN
Bilirubin Urine: NEGATIVE
Glucose, UA: NEGATIVE mg/dL
Hgb urine dipstick: NEGATIVE
Ketones, ur: 5 mg/dL — AB
Leukocytes,Ua: NEGATIVE
Nitrite: NEGATIVE
Protein, ur: NEGATIVE mg/dL
Specific Gravity, Urine: 1.017 (ref 1.005–1.030)
pH: 5 (ref 5.0–8.0)

## 2020-10-31 LAB — RAPID URINE DRUG SCREEN, HOSP PERFORMED
Amphetamines: NOT DETECTED
Barbiturates: NOT DETECTED
Benzodiazepines: NOT DETECTED
Cocaine: NOT DETECTED
Opiates: POSITIVE — AB
Tetrahydrocannabinol: NOT DETECTED

## 2020-10-31 LAB — TSH: TSH: 1.732 u[IU]/mL (ref 0.350–4.500)

## 2020-10-31 LAB — GLUCOSE, CAPILLARY: Glucose-Capillary: 89 mg/dL (ref 70–99)

## 2020-10-31 LAB — PHOSPHORUS: Phosphorus: 4.4 mg/dL (ref 2.5–4.6)

## 2020-10-31 LAB — CK: Total CK: 413 U/L — ABNORMAL HIGH (ref 38–234)

## 2020-10-31 LAB — HIV ANTIBODY (ROUTINE TESTING W REFLEX): HIV Screen 4th Generation wRfx: NONREACTIVE

## 2020-10-31 LAB — PROTIME-INR
INR: 1 (ref 0.8–1.2)
Prothrombin Time: 13.3 seconds (ref 11.4–15.2)

## 2020-10-31 MED ORDER — LORAZEPAM 2 MG/ML IJ SOLN: 1.0000 mg | INTRAMUSCULAR | Status: DC | PRN

## 2020-10-31 MED ORDER — INSULIN GLARGINE 100 UNITS/ML SOLOSTAR PEN: 20.0000 [IU] | PEN_INJECTOR | Freq: Every day | SUBCUTANEOUS | Status: DC

## 2020-10-31 MED ORDER — INSULIN GLARGINE 100 UNITS/ML SOLOSTAR PEN: 12.0000 [IU] | PEN_INJECTOR | Freq: Two times a day (BID) | SUBCUTANEOUS | Status: DC

## 2020-10-31 MED ORDER — HYDROCODONE-ACETAMINOPHEN 5-325 MG PO TABS
1.0000 | ORAL_TABLET | Freq: Four times a day (QID) | ORAL | Status: DC | PRN
Start: 2020-10-31 — End: 2020-11-04

## 2020-10-31 MED ORDER — TRAZODONE HCL 50 MG PO TABS: 50.0000 mg | ORAL_TABLET | Freq: Every evening | ORAL | Status: DC | PRN

## 2020-10-31 MED ORDER — LEVETIRACETAM IN NACL 1000 MG/100ML IV SOLN
1000.0000 mg | Freq: Once | INTRAVENOUS | Status: AC
  Administered 2020-10-31: 1000 mg via INTRAVENOUS
  Filled 2020-10-31: qty 100

## 2020-10-31 MED ORDER — DEXTROSE 50 % IV SOLN
INTRAVENOUS | Status: AC
  Administered 2020-10-31: 25 mL via INTRAVENOUS
  Filled 2020-10-31: qty 50

## 2020-10-31 MED ORDER — VALACYCLOVIR HCL 500 MG PO TABS
500.0000 mg | ORAL_TABLET | Freq: Every day | ORAL | Status: DC
  Administered 2020-10-31 – 2020-11-02 (×3): 500 mg via ORAL
  Filled 2020-10-31 (×3): qty 1

## 2020-10-31 MED ORDER — POLYETHYLENE GLYCOL 3350 17 G PO PACK
17.0000 g | PACK | Freq: Two times a day (BID) | ORAL | Status: DC
  Administered 2020-11-01 – 2020-11-03 (×5): 17 g via ORAL
  Filled 2020-10-31 (×6): qty 1

## 2020-10-31 MED ORDER — DEXTROSE-NACL 10-0.45 % IV SOLN
INTRAVENOUS | Status: DC
  Filled 2020-10-31 (×2): qty 1000

## 2020-10-31 MED ORDER — SODIUM CHLORIDE 0.9 % IV SOLN
75.0000 mL/h | INTRAVENOUS | Status: DC
  Administered 2020-10-31: 75 mL/h via INTRAVENOUS

## 2020-10-31 MED ORDER — RISPERIDONE 2 MG PO TABS
2.0000 mg | ORAL_TABLET | Freq: Every day | ORAL | Status: DC
  Administered 2020-10-31 – 2020-11-02 (×3): 2 mg via ORAL
  Filled 2020-10-31 (×5): qty 1

## 2020-10-31 MED ORDER — LEVETIRACETAM 500 MG PO TABS
1000.0000 mg | ORAL_TABLET | Freq: Two times a day (BID) | ORAL | Status: DC
  Administered 2020-10-31 – 2020-11-03 (×6): 1000 mg via ORAL
  Filled 2020-10-31 (×6): qty 2

## 2020-10-31 MED ORDER — ACETAMINOPHEN 325 MG PO TABS: 650.0000 mg | ORAL_TABLET | Freq: Four times a day (QID) | ORAL | Status: DC | PRN

## 2020-10-31 MED ORDER — HYDROCODONE-ACETAMINOPHEN 5-325 MG PO TABS: 1.0000 | ORAL_TABLET | ORAL | Status: DC | PRN

## 2020-10-31 MED ORDER — DEXTROSE 50 % IV SOLN: 12.5000 g | INTRAVENOUS | Status: AC

## 2020-10-31 MED ORDER — SIMVASTATIN 20 MG PO TABS
20.0000 mg | ORAL_TABLET | Freq: Every day | ORAL | Status: DC
  Administered 2020-10-31 – 2020-11-02 (×3): 20 mg via ORAL
  Filled 2020-10-31 (×3): qty 1

## 2020-10-31 MED ORDER — DEXTROSE 50 % IV SOLN
INTRAVENOUS | Status: AC
  Administered 2020-10-31: 12.5 g via INTRAVENOUS
  Filled 2020-10-31: qty 50

## 2020-10-31 MED ORDER — ACETAMINOPHEN 650 MG RE SUPP: 650.0000 mg | Freq: Four times a day (QID) | RECTAL | Status: DC | PRN

## 2020-10-31 MED ORDER — DOCUSATE SODIUM 100 MG PO CAPS
200.0000 mg | ORAL_CAPSULE | Freq: Two times a day (BID) | ORAL | Status: DC
  Administered 2020-11-01 – 2020-11-03 (×5): 200 mg via ORAL
  Filled 2020-10-31 (×6): qty 2

## 2020-10-31 MED ORDER — GADOBUTROL 1 MMOL/ML IV SOLN
7.5000 mL | Freq: Once | INTRAVENOUS | Status: AC | PRN
  Administered 2020-10-31: 7.5 mL via INTRAVENOUS

## 2020-10-31 MED ORDER — LEVETIRACETAM 500 MG PO TABS: 1000.0000 mg | ORAL_TABLET | Freq: Two times a day (BID) | ORAL | Status: DC

## 2020-10-31 MED ORDER — THIAMINE HCL 100 MG/ML IJ SOLN
100.0000 mg | Freq: Every day | INTRAMUSCULAR | Status: DC
  Administered 2020-10-31 – 2020-11-01 (×2): 100 mg via INTRAVENOUS
  Filled 2020-10-31 (×2): qty 2

## 2020-10-31 MED ORDER — DEXTROSE-NACL 5-0.9 % IV SOLN: INTRAVENOUS | Status: AC

## 2020-10-31 MED ORDER — LEVETIRACETAM IN NACL 1000 MG/100ML IV SOLN
1000.0000 mg | Freq: Two times a day (BID) | INTRAVENOUS | Status: DC
  Filled 2020-10-31 (×4): qty 100

## 2020-10-31 MED ORDER — MORPHINE SULFATE (PF) 2 MG/ML IV SOLN
1.0000 mg | Freq: Once | INTRAVENOUS | Status: AC
Start: 2020-10-31 — End: 2020-10-31
  Administered 2020-10-31: 1 mg via INTRAVENOUS
  Filled 2020-10-31: qty 1

## 2020-10-31 NOTE — ED Notes (Signed)
Pt to mri 

## 2020-10-31 NOTE — Progress Notes (Signed)
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  She is on Amaryl 2 mg daily at home.  Started D10 half-normal saline at 75 cc/h x 1 day.  Now CBG every 4 hours.  Her CBGs are more stable.    If hypoglycemia recurs, increase rate of IV fluid and check CBG every hour.

## 2020-10-31 NOTE — ED Notes (Addendum)
Pt able to tolerate thin liquids. Pt given 8 oz of juice for sugar of 79

## 2020-10-31 NOTE — ED Notes (Signed)
EEG tech at bedside. 

## 2020-10-31 NOTE — ED Notes (Signed)
Pt c/o abd. Pain. MD Thedore Mins paged.

## 2020-10-31 NOTE — ED Notes (Signed)
MD Margo Aye paged for CBG 66

## 2020-10-31 NOTE — ED Notes (Signed)
Report at this time. Awaiting report to transfer in inpatient bed. Resting in bed quietly.

## 2020-10-31 NOTE — ED Notes (Signed)
Attempted report at this time, RN off unit

## 2020-10-31 NOTE — Progress Notes (Signed)
PROGRESS NOTE                                                                                                                                                                                                             Patient Demographics:    [name], is a 55 y.o. female, DOB - 1965/06/28, HGD:[id number]  Outpatient Primary MD for the patient is Mirna Mires, MD    LOS - 0  Admit date - 10/30/2020    Chief Complaint  Patient presents with  . Seizures  . Altered Mental Status       Brief Narrative (HPI from H&P) - [name] is a 55 y.o. female with medical history significant of intellectual disability, DM 2, HTN bipolar disorder, presented with possible seizure, increased confusion over past 2 wks   Subjective:    Tilley Faeth today has, No headache, No chest pain, No abdominal pain - No Nausea, No new weakness tingling or numbness, no SOB.   Assessment  & Plan :     1. Metabolic Encephalopathy due to breakthrough seizures - On Keppra, Neuro following, CT and MRI brain nonacute.  As needed IV Ativan and get EEG.  2.  Intellectual disability.  Supportive care.  3.  Bipolar disorder.  For now risperidone nightly.  4.  Dyslipidemia.  Placed on home dose statin.    5. DM type II.  Due for sliding scale as p.o. intake is inconsistent, does take moderate dose Lantus.  Lab Results  Component Value Date   HGBA1C 9.0 (H) 10/30/2020   CBG (last 3)  Recent Labs    10/31/20 0454 10/31/20 0618 10/31/20 0816  GLUCAP 123* 97 143*         Condition -  Guarded  Family Communication  :  Luanna Cole [phone] message left 10/31/20 @ 9.28 am  Code Status :  Full  Consults  :  Neuro  PUD Prophylaxis : None   Procedures  :     MRI and CT Head - Non acute      Disposition Plan  :    Status is:    Dispo: The patient is from: Home              Anticipated d/c is to: TBD  Patient currently is not medically stable to d/c.   Difficult to place patient No  DVT Prophylaxis  :    Place and maintain sequential compression device Start: 10/31/20 0708 SCDs Start: 10/31/20 0130     Lab Results  Component Value Date   PLT 403 (H) 10/30/2020    Diet :  Diet Order            Diet clear liquid Room service appropriate? Yes; Fluid consistency: Thin  Diet effective now                  Inpatient Medications  Scheduled Meds: . insulin aspart  0-9 Units Subcutaneous Q4H  . levETIRAcetam  1,000 mg Oral BID  . risperiDONE  2 mg Oral QHS  . simvastatin  20 mg Oral QHS  . thiamine injection  100 mg Intravenous Daily  . valACYclovir  500 mg Oral QHS   Continuous Infusions: . dextrose 10 % and 0.45 % NaCl 75 mL/hr at 10/31/20 0512  . levETIRAcetam     PRN Meds:.acetaminophen **OR** acetaminophen, HYDROcodone-acetaminophen, LORazepam, traZODone  Antibiotics  :    Anti-infectives (From admission, onward)   Start     Dose/Rate Route Frequency Ordered Stop   10/31/20 2200  valACYclovir (VALTREX) tablet 500 mg        500 mg Oral Daily at bedtime 10/31/20 0932         Time Spent in minutes  30   Susa Raring M.D on 10/31/2020 at 9:37 AM  To page go to [email or web address]   [facility] -  Office  [phone]    See all Orders from today for further details    Objective:   Vitals:   10/31/20 0500 10/31/20 0515 10/31/20 0545 10/31/20 0845  BP: 122/89  132/75 109/70  Pulse: 80 76 71 74  Resp:  Temp:      TempSrc:      SpO2: 96% 99% 99% 98%  Weight:      Height:        Wt Readings from Last 3 Encounters:  10/30/20 83 kg  10/28/20 83 kg  08/28/12 83 kg     Intake/Output Summary (Last 24 hours) at 10/31/2020 0937 Last data filed at 10/31/2020 0501 Gross per 24 hour  Intake 1100 ml  Output --  Net 1100 ml     Physical Exam   But easily arousable, follows basic commands, no focal deficits [place].AT,PERRAL Supple  Neck,No JVD, No cervical lymphadenopathy appriciated.  Symmetrical Chest wall movement, Good air movement bilaterally, CTAB RRR,No Gallops,Rubs or new Murmurs, No Parasternal Heave +ve B.Sounds, Abd Soft, No tenderness, No organomegaly appriciated, No rebound - guarding or rigidity. No Cyanosis, Clubbing or edema, No new Rash or bruise     Data Review:    CBC Recent Labs  Lab 10/28/20 2013 10/30/20 1925  WBC 9.1 9.1  HGB 12.2 13.5  HCT 36.4 40.7  PLT 339 403*  MCV 91.0 91.1  MCH 30.5 30.2  MCHC 33.5 33.2  RDW 12.9 13.0  LYMPHSABS 3.8 3.2  MONOABS 0.6 0.6  EOSABS 0.1 0.0  BASOSABS 0.1 0.1    Recent Labs  Lab 10/28/20 2013 10/30/20 1925 10/30/20 2308 10/31/20 0500 10/31/20 0725  NA 135 140  --  139  --   K 3.5 3.5  --  3.8  --   CL 100 106  --  105  --   CO2 27 24  --  24  --  GLUCOSE 152* 95  --  119*  --   BUN 14 7  --  6  --   CREATININE 0.77 0.81  --  0.65  --   CALCIUM 9.6 9.9  --  9.3  --   AST 59* 67*  --  63*  --   ALT 58* 56*  --  52*  --   ALKPHOS 68 70  --  65  --   BILITOT 0.7 0.9  --  1.0  --   ALBUMIN 4.5 4.2  --  3.9  --   MG  --  2.1  --  2.2  --   INR 0.9  --   --   --  1.0  TSH  --   --   --  1.732  --   HGBA1C  --   --  9.0*  --   --   AMMONIA 33  --   --   --   --     ------------------------------------------------------------------------------------------------------------------ No results for input(s): CHOL, HDL, LDLCALC, TRIG, CHOLHDL, LDLDIRECT in the last 72 hours.  Lab Results  Component Value Date   HGBA1C 9.0 (H) 10/30/2020   ------------------------------------------------------------------------------------------------------------------ Recent Labs    10/31/20 0500  TSH 1.732    Cardiac Enzymes No results for input(s): CKMB, TROPONINI, MYOGLOBIN in the last 168 hours.  Invalid input(s): CK ------------------------------------------------------------------------------------------------------------------ No results  found for: BNP  Micro Results Recent Results (from the past 240 hour(s))  Resp Panel by RT-PCR (Flu A&B, Covid) Nasopharyngeal Swab     Status: None   Collection Time: 10/28/20  9:24 PM   Specimen: Nasopharyngeal Swab; Nasopharyngeal(NP) swabs in vial transport medium  Result Value Ref Range Status   SARS Coronavirus 2 by RT PCR NEGATIVE NEGATIVE Final    Comment: (NOTE) SARS-CoV-2 target nucleic acids are NOT DETECTED.  The SARS-CoV-2 RNA is generally detectable in upper respiratory specimens during the acute phase of infection. The lowest concentration of SARS-CoV-2 viral copies this assay can detect is 138 copies/mL. A negative result does not preclude SARS-Cov-2 infection and should not be used as the sole basis for treatment or other patient management decisions. A negative result may occur with  improper specimen collection/handling, submission of specimen other than nasopharyngeal swab, presence of viral mutation(s) within the areas targeted by this assay, and inadequate number of viral copies(<138 copies/mL). A negative result must be combined with clinical observations, patient history, and epidemiological information. The expected result is Negative.  Fact Sheet for Patients:  BloggerCourse.com  Fact Sheet for Healthcare Providers:  SeriousBroker.it  This test is no t yet approved or cleared by the [place] FDA and  has been authorized for detection and/or diagnosis of SARS-CoV-2 by FDA under an Emergency Use Authorization (EUA). This EUA will remain  in effect (meaning this test can be used) for the duration of the COVID-19 declaration under Section 564(b)(1) of the Act, 21 U.S.C.section 360bbb-3(b)(1), unless the authorization is terminated  or revoked sooner.       Influenza A by PCR NEGATIVE NEGATIVE Final   Influenza B by PCR NEGATIVE NEGATIVE Final    Comment: (NOTE) The Xpert Xpress SARS-CoV-2/FLU/RSV  plus assay is intended as an aid in the diagnosis of influenza from Nasopharyngeal swab specimens and should not be used as a sole basis for treatment. Nasal washings and aspirates are unacceptable for Xpert Xpress SARS-CoV-2/FLU/RSV testing.  Fact Sheet for Patients: BloggerCourse.com  Fact Sheet for Healthcare Providers: SeriousBroker.it  This test is not  yet approved or cleared by the [place] FDA and has been authorized for detection and/or diagnosis of SARS-CoV-2 by FDA under an Emergency Use Authorization (EUA). This EUA will remain in effect (meaning this test can be used) for the duration of the COVID-19 declaration under Section 564(b)(1) of the Act, 21 U.S.C. section 360bbb-3(b)(1), unless the authorization is terminated or revoked.  Performed at [facility], [address] [place], [place] [address]   Resp Panel by RT-PCR (Flu A&B, Covid) Nasopharyngeal Swab     Status: None   Collection Time: 10/30/20 10:22 PM   Specimen: Nasopharyngeal Swab; Nasopharyngeal(NP) swabs in vial transport medium  Result Value Ref Range Status   SARS Coronavirus 2 by RT PCR NEGATIVE NEGATIVE Final    Comment: (NOTE) SARS-CoV-2 target nucleic acids are NOT DETECTED.  The SARS-CoV-2 RNA is generally detectable in upper respiratory specimens during the acute phase of infection. The lowest concentration of SARS-CoV-2 viral copies this assay can detect is 138 copies/mL. A negative result does not preclude SARS-Cov-2 infection and should not be used as the sole basis for treatment or other patient management decisions. A negative result may occur with  improper specimen collection/handling, submission of specimen other than nasopharyngeal swab, presence of viral mutation(s) within the areas targeted by this assay, and inadequate number of viral copies(<138 copies/mL). A negative result must be combined with clinical  observations, patient history, and epidemiological information. The expected result is Negative.  Fact Sheet for Patients:  BloggerCourse.comhttps://www.fda.gov/media/152166/download  Fact Sheet for Healthcare Providers:  SeriousBroker.ithttps://www.fda.gov/media/152162/download  This test is no t yet approved or cleared by the [place] FDA and  has been authorized for detection and/or diagnosis of SARS-CoV-2 by FDA under an Emergency Use Authorization (EUA). This EUA will remain  in effect (meaning this test can be used) for the duration of the COVID-19 declaration under Section 564(b)(1) of the Act, 21 U.S.C.section 360bbb-3(b)(1), unless the authorization is terminated  or revoked sooner.       Influenza A by PCR NEGATIVE NEGATIVE Final   Influenza B by PCR NEGATIVE NEGATIVE Final    Comment: (NOTE) The Xpert Xpress SARS-CoV-2/FLU/RSV plus assay is intended as an aid in the diagnosis of influenza from Nasopharyngeal swab specimens and should not be used as a sole basis for treatment. Nasal washings and aspirates are unacceptable for Xpert Xpress SARS-CoV-2/FLU/RSV testing.  Fact Sheet for Patients: BloggerCourse.comhttps://www.fda.gov/media/152166/download  Fact Sheet for Healthcare Providers: SeriousBroker.ithttps://www.fda.gov/media/152162/download  This test is not yet approved or cleared by the [place] FDA and has been authorized for detection and/or diagnosis of SARS-CoV-2 by FDA under an Emergency Use Authorization (EUA). This EUA will remain in effect (meaning this test can be used) for the duration of the COVID-19 declaration under Section 564(b)(1) of the Act, 21 U.S.C. section 360bbb-3(b)(1), unless the authorization is terminated or revoked.  Performed at [facility], [address] [place], [place] [address]     Radiology Reports DG Chest 1 View  Result Date: 10/31/2020 CLINICAL DATA:  Metal implant screen prior to MRI. EXAM: CHEST  1 VIEW COMPARISON:  None. FINDINGS: The heart size and mediastinal  contours are within normal limits. Both lungs are clear. The visualized skeletal structures are unremarkable. IMPRESSION: No active disease. Electronically Signed   By: Maudry MayhewJeffrey  Waltz MD   On: 10/31/2020 02:43   DG Abd 1 View  Result Date: 10/31/2020 CLINICAL DATA:  Metal implant screen prior to MRI. EXAM: ABDOMEN - 1 VIEW COMPARISON:  Abdominal  ultrasound March 10, 2009. FINDINGS: The bowel gas pattern is normal. No radio-opaque calculi or other significant radiographic abnormality are seen. IMPRESSION: Negative. Electronically Signed   By: Maudry Mayhew MD   On: 10/31/2020 02:42   CT HEAD WO CONTRAST  Result Date: 10/30/2020 CLINICAL DATA:  Seizure EXAM: CT HEAD WITHOUT CONTRAST TECHNIQUE: Contiguous axial images were obtained from the base of the skull through the vertex without intravenous contrast. COMPARISON:  Oct 28, 2020. FINDINGS: Brain: Ventricles and sulci are normal in size and configuration. There is no intracranial mass, hemorrhage, extra-axial fluid collection, or midline shift. Brain parenchyma appears unremarkable. No acute infarct is appreciable. Vascular: No hyperdense vessel.  No evident vascular calcification. Skull: Bony calvarium appears intact. Sinuses/Orbits: There is a retention cyst in the inferior right maxillary antrum. Other paranasal sinuses are clear. Orbits appear symmetric bilaterally. Other: Mastoid air cells are clear. IMPRESSION: Normal appearing brain parenchyma. No mass or hemorrhage. No evident acute infarct. Retention cyst inferior right maxillary antrum. Electronically Signed   By: Bretta Bang III M.D.   On: 10/30/2020 21:56   CT Head Wo Contrast  Result Date: 10/28/2020 CLINICAL DATA:  Mental status changes of unknown cause. EXAM: CT HEAD WITHOUT CONTRAST TECHNIQUE: Contiguous axial images were obtained from the base of the skull through the vertex without intravenous contrast. COMPARISON:  03/29/2007 FINDINGS: Brain: The brain has normal appearance  without evidence of atrophy, old or acute infarction, mass lesion, hemorrhage, hydrocephalus or extra-axial collection. Vascular: Evident no abnormal vascular finding. Skull: Normal Sinuses/Orbits: Clear/normal Other: None IMPRESSION: Normal examination. No abnormality seen to explain mental status changes. Electronically Signed   By: Paulina Fusi M.D.   On: 10/28/2020 20:30   MR BRAIN W WO CONTRAST  Result Date: 10/31/2020 CLINICAL DATA:  Seizure with abnormal neuro exam EXAM: MRI HEAD WITHOUT AND WITH CONTRAST TECHNIQUE: Multiplanar, multiecho pulse sequences of the brain and surrounding structures were obtained without and with intravenous contrast. CONTRAST:  7.52mL GADAVIST GADOBUTROL 1 MMOL/ML IV SOLN COMPARISON:  10/30/2020 FINDINGS: Brain: No acute infarction, hemorrhage, hydrocephalus, extra-axial collection or mass lesion. No cortical finding to explain seizure. No white matter disease, atrophy, or abnormal enhancement. Small remote left cerebellar infarct. Vascular: Normal flow voids and vascular enhancements Skull and upper cervical spine: Normal marrow signal Sinuses/Orbits: Negative Other: Motion degradation which is intermittent and at times creates nondiagnostic slices. IMPRESSION: 1. Motion degraded brain MRI without acute finding. 2. No visible correlate for seizure. 3. Small remote left cerebellar infarct Electronically Signed   By: Marnee Spring M.D.   On: 10/31/2020 04:15   DG Chest Portable 1 View  Result Date: 10/28/2020 CLINICAL DATA:  Altered mental status. EXAM: PORTABLE CHEST 1 VIEW COMPARISON:  Remote radiograph 03/25/2009 FINDINGS: Lung volumes are low. Mild bibasilar atelectasis. Normal heart size for technique. No pulmonary edema, pleural effusion, or pneumothorax. No acute osseous abnormalities are seen. IMPRESSION: Low lung volumes with bibasilar atelectasis. Electronically Signed   By: Narda Rutherford M.D.   On: 10/28/2020 20:20

## 2020-10-31 NOTE — Progress Notes (Signed)
NEUROLOGY CONSULTATION PROGRESS NOTE   Date of service: Oct 31, 2020 Patient Name: [name] MRN:  [id number] DOB:  17-May-1966  Brief HPI  Adriona KALIFA CADDEN is a 55 year old female with mild mental retardation who presents with frequent recurrent seizures without complete return to her baseline. For about a week now, has been having episodes of spacing out/behavioral arrest. In addition, has been having intermittent R face twitching and very little speech x 1 week, no speech over the last day. Was on Depakote in the past, seems like she was taking it for her mood and stopped in august of last year.  Interval Hx   Somnolent, awakens to follow commands and answer questions. Goes right back to sleep. cEEG to be hooked up this AM.  Vitals   Vitals:   10/31/20 0400 10/31/20 0500 10/31/20 0515 10/31/20 0545  BP: 119/76 122/89  132/75  Pulse: 74 80 76 71  Resp:   16 13  Temp:      TempSrc:      SpO2: 96% 96% 99% 99%  Weight:      Height:         Body mass index is 34.57 kg/m.  Physical Exam   General: Laying comfortably in bed; in no acute distress. HENT: Normal oropharynx and mucosa. Normal external appearance of ears and nose. Pulmonary: Symmetric Chest rise. Normal respiratory effort. Abdomen: Soft to touch, non-tender. Ext: No cyanosis, edema, or deformity Skin: No rash. Normal palpation of skin.  Neurologic Examination  Mental status/Cognition: somnolent, opens eye to voice, requires encouragement to keep her from falling asleep. Oriented to self. Poor attention. Cranial nerves:   CN II Pupils equal and reactive to light, no VF deficits   CN III,IV,VI    CN V    CN VII Symmetric facial smile.   CN VIII    CN IX & X    CN XI    CN XII    Motor:  Muscle bulk: normal, tone normal Somnolence precludes full motor exam. Shows a thumbs up, gives a high five, then goes back to sleep.  Sensation:  Light touch Intact throughout   Pin prick    Temperature    Vibration    Proprioception    Coordination/Complex Motor:  Unable to assess but no obvious ataxia or tremor.  Labs   Basic Metabolic Panel:  Lab Results  Component Value Date   NA 139 10/31/2020   K 3.8 10/31/2020   CO2 24 10/31/2020   GLUCOSE 119 (H) 10/31/2020   BUN 6 10/31/2020   CREATININE 0.65 10/31/2020   CALCIUM 9.3 10/31/2020   GFRNONAA >60 10/31/2020   GFRAA  03/24/2009    >60        The eGFR has been calculated using the MDRD equation. This calculation has not been validated in all clinical situations. eGFR's persistently <60 mL/min signify possible Chronic Kidney Disease.   HbA1c:  Lab Results  Component Value Date   HGBA1C 9.0 (H) 10/30/2020   LDL: No results found for: [facility] Urine Drug Screen:     Component Value Date/Time   LABOPIA NONE DETECTED 10/28/2020 1945   COCAINSCRNUR NONE DETECTED 10/28/2020 1945   LABBENZ NONE DETECTED 10/28/2020 1945   AMPHETMU NONE DETECTED 10/28/2020 1945   THCU NONE DETECTED 10/28/2020 1945   LABBARB NONE DETECTED 10/28/2020 1945    Alcohol Level     Component Value Date/Time   ETH <10 10/28/2020 2013   No results found for: PHENYTOIN, ZONISAMIDE,  LAMOTRIGINE, LEVETIRACETA Lab Results  Component Value Date   VALPROATE 11 (L) 10/30/2020    Imaging and Diagnostic studies   CT HEAD WITHOUT CONTRAST  Normal appearing brain parenchyma. No mass or hemorrhage. No evident acute infarct.  MRI Aaron Edelman without contrast:  1. Motion degraded brain MRI without acute finding. 2. No visible correlate for seizure. 3. Small remote left cerebellar infarct    Impression   [name] is a 55 y.o. female with PMH significant for DM2, HTn, mild mental retardation who presents with frequent recurrent seizures. Her neurologic examination is notable for notable for somnolence but awakens to follow commands. Althou not actively seizing at this time, will get cEEG to monitor for concern for frequent/recurrent subclinical  seizures.  Recommendations  - Keppra 1056m BID PO or IV - Seizure precautions - cEEG - Ativan 166mfor seizure activity lasting more than 2 mins. Notify neurology immediately after administering Ativan. ______________________________________________________________________   Thank you for the opportunity to take part in the care of this patient. If you have any further questions, please contact the neurology consultation attending.  Signed,  [facility] Number [phone]

## 2020-10-31 NOTE — Progress Notes (Signed)
Overnight EEG in progress, result pending.

## 2020-10-31 NOTE — Progress Notes (Signed)
Looked around for Seizure Precaution Pads. None on unit, Secretary called downstairs was told that there were no pads available. Will improvise.

## 2020-10-31 NOTE — ED Notes (Signed)
Attempted report x1. 

## 2020-10-31 NOTE — Evaluation (Addendum)
Physical Therapy Evaluation Patient Details Name: [name] MRN: [id number] DOB: 1966/03/08 Today's Date: 10/31/2020   History of Present Illness  55 y.o. female presenting to ED with possible seizure and increased confusion over last 2 weeks. PMH includes intellectual disability, DM 2, HTN bipolar disorder. CT head and CXR non-acute findings.  Clinical Impression  Pt reports to be in pain on PT arrival, nurse notified. Pt hx limited secondary to family unavailable at time of evaluation. Pt demonstrates ability to perform bed mobility, requiring multiple verbal and tactile cues with physical assistance to perform. Initially, pt unsteady without UE support, leaning to the L when standing. Pt demonstrates instability when attempting to ambulate without AD, improved with use of RW. Pt demonstrates tendency to veer R and L during gait with RW. Pt demonstrates deficits in balance, cognition, strength, endurance, power, and activity tolerance and will benefit from acute PT to improve dynamic balance and independence in mobility. SPT recommends outpatient PT if caregiver support 24/7 to improve balance and dynamic gait activities. If caregiver assistance is not available, SNF placement to assist with functional mobility.     Follow Up Recommendations Outpatient PT;Supervision/Assistance - 24 hour (Home with Outpatient PT if caregiver support available, SNF if unavailable.)    Equipment Recommendations  Rolling walker with 5" wheels    Recommendations for Other Services       Precautions / Restrictions Precautions Precautions: Fall Restrictions Weight Bearing Restrictions: No      Mobility  Bed Mobility Overal bed mobility: Needs Assistance Bed Mobility: Supine to Sit;Sit to Supine     Supine to sit: Min assist Sit to supine: Min guard   General bed mobility comments: Pt requirs VC and tactile cues to bring LEs over EOB and bring trunk upright during supine>sit. Pt requires HHA to  sit up.    Transfers Overall transfer level: Needs assistance Equipment used: None Transfers: Sit to/from Stand Sit to Stand: Min guard            Ambulation/Gait Ambulation/Gait assistance: Min guard Gait Distance (Feet): 100 Feet Assistive device: Rolling walker (2 wheeled);None Gait Pattern/deviations: Step-through pattern;Decreased stride length;Drifts right/left Gait velocity: decreased Gait velocity interpretation: >2.62 ft/sec, indicative of community ambulatory General Gait Details: Pt demonstrates instability when performing gait without RW. Improved balance when using RW with tendency to veer to R and L.  Stairs            Wheelchair Mobility    Modified Rankin (Stroke Patients Only)       Balance Overall balance assessment: Needs assistance Sitting-balance support: No upper extremity supported Sitting balance-Leahy Scale: Fair     Standing balance support: During functional activity;No upper extremity supported;Bilateral upper extremity supported Standing balance-Leahy Scale: Fair Standing balance comment: Pt demonstrates some instability without use of RW and UE support.                             Pertinent Vitals/Pain Pain Assessment: Faces Faces Pain Scale: Hurts even more Pain Location: stomach Pain Descriptors / Indicators: Discomfort;Moaning Pain Intervention(s): Limited activity within patient's tolerance;Monitored during session (Nurse notified.)    Home Living Family/patient expects to be discharged to:: Private residence Living Arrangements: Other relatives (sister) Available Help at Discharge: Family (sister) Type of Home: House Home Access: Level entry     Home Layout: One level   Additional Comments: Unable to gather full history as pt is unable to provide answers at this time  and family unavailable as well. Pt responds to questions with "yeah, I'm in pain". Nurse notified of pt's stomach pain.    Prior Function  Level of Independence: Needs assistance         Comments: Pt likely requires assistance with ADL's. Called sister for clarification, but sister unavailable.     Hand Dominance        Extremity/Trunk Assessment   Upper Extremity Assessment Upper Extremity Assessment: Defer to OT evaluation    Lower Extremity Assessment Lower Extremity Assessment: Overall WFL for tasks assessed    Cervical / Trunk Assessment Cervical / Trunk Assessment: Normal  Communication   Communication: Other (comment) (Pt responds to some questions. Pt reports experiencing stomach pain and responds to other questions with "I'm in pain".)  Cognition Arousal/Alertness: Awake/alert Behavior During Therapy: Flat affect Overall Cognitive Status: No family/caregiver present to determine baseline cognitive functioning                                 General Comments: hx of intellectual disability. Pt follows one step commands inconsistently.      General Comments General comments (skin integrity, edema, etc.): Pt reports stomach pain. Pt's sister phoned to provide more clarity on hx, but is unavailable at this time.    Exercises     Assessment/Plan    PT Assessment Patient needs continued PT services  PT Problem List Decreased activity tolerance;Decreased balance;Decreased mobility;Decreased coordination;Decreased cognition;Decreased knowledge of use of DME;Decreased safety awareness;Decreased knowledge of precautions;Pain       PT Treatment Interventions DME instruction;Gait training;Functional mobility training;Therapeutic activities;Therapeutic exercise;Balance training;Cognitive remediation;Patient/family education    PT Goals (Current goals can be found in the Care Plan section)  Acute Rehab PT Goals Patient Stated Goal: Manage stomach pain. PT Goal Formulation: With patient Time For Goal Achievement: 11/14/20 Potential to Achieve Goals: Good    Frequency Min 3X/week    Barriers to discharge        Co-evaluation               AM-PAC PT "6 Clicks" Mobility  Outcome Measure Help needed turning from your back to your side while in a flat bed without using bedrails?: A Little Help needed moving from lying on your back to sitting on the side of a flat bed without using bedrails?: A Little Help needed moving to and from a bed to a chair (including a wheelchair)?: A Little Help needed standing up from a chair using your arms (e.g., wheelchair or bedside chair)?: A Little Help needed to walk in hospital room?: A Little Help needed climbing 3-5 steps with a railing? : A Little 6 Click Score: 18    End of Session Equipment Utilized During Treatment: Gait belt Activity Tolerance: Patient limited by pain Patient left: in bed;with call bell/phone within reach Nurse Communication: Mobility status PT Visit Diagnosis: Unsteadiness on feet (R26.81);Other abnormalities of gait and mobility (R26.89);Pain Pain - Right/Left:  (stomach) Pain - part of body:  (stomach)    Time: 7035-0093 PT Time Calculation (min) (ACUTE ONLY): 35 min   Charges:   PT Evaluation $PT Eval Low Complexity: 1 Low          Acute Rehab  Pager: [phone]   Waldemar Dickens, SPT  10/31/2020, 1:41 PM

## 2020-11-01 ENCOUNTER — Inpatient Hospital Stay (HOSPITAL_COMMUNITY): Payer: Medicaid Other

## 2020-11-01 DIAGNOSIS — E118 Type 2 diabetes mellitus with unspecified complications: Secondary | ICD-10-CM | POA: Diagnosis not present

## 2020-11-01 DIAGNOSIS — G9340 Encephalopathy, unspecified: Secondary | ICD-10-CM

## 2020-11-01 DIAGNOSIS — R569 Unspecified convulsions: Secondary | ICD-10-CM | POA: Diagnosis not present

## 2020-11-01 LAB — COMPREHENSIVE METABOLIC PANEL
ALT: 45 U/L — ABNORMAL HIGH (ref 0–44)
AST: 55 U/L — ABNORMAL HIGH (ref 15–41)
Albumin: 3.3 g/dL — ABNORMAL LOW (ref 3.5–5.0)
Alkaline Phosphatase: 58 U/L (ref 38–126)
Anion gap: 7 (ref 5–15)
BUN: <5 mg/dL — ABNORMAL LOW (ref 6–20)
CO2: 24 mmol/L (ref 22–32)
Calcium: 9.2 mg/dL (ref 8.9–10.3)
Chloride: 108 mmol/L (ref 98–111)
Creatinine, Ser: 0.63 mg/dL (ref 0.44–1.00)
GFR, Estimated: >60 mL/min (ref >60)
Glucose, Bld: 131 mg/dL — ABNORMAL HIGH (ref 70–99)
Potassium: 4 mmol/L (ref 3.5–5.1)
Sodium: 139 mmol/L (ref 135–145)
Total Bilirubin: 1.5 mg/dL — ABNORMAL HIGH (ref 0.3–1.2)
Total Protein: 6.2 g/dL — ABNORMAL LOW (ref 6.5–8.1)

## 2020-11-01 LAB — GLUCOSE, CAPILLARY
Glucose-Capillary: 106 mg/dL — ABNORMAL HIGH (ref 70–99)
Glucose-Capillary: 138 mg/dL — ABNORMAL HIGH (ref 70–99)
Glucose-Capillary: 145 mg/dL — ABNORMAL HIGH (ref 70–99)
Glucose-Capillary: 196 mg/dL — ABNORMAL HIGH (ref 70–99)
Glucose-Capillary: 212 mg/dL — ABNORMAL HIGH (ref 70–99)
Glucose-Capillary: 83 mg/dL (ref 70–99)

## 2020-11-01 LAB — CBC WITH DIFFERENTIAL/PLATELET
Abs Immature Granulocytes: 0.01 10*3/uL (ref 0.00–0.07)
Basophils Absolute: 0.1 10*3/uL (ref 0.0–0.1)
Basophils Relative: 1 %
Eosinophils Absolute: 0.1 10*3/uL (ref 0.0–0.5)
Eosinophils Relative: 1 %
HCT: 41 % (ref 36.0–46.0)
Hemoglobin: 12.9 g/dL (ref 12.0–15.0)
Immature Granulocytes: 0 %
Lymphocytes Relative: 56 %
Lymphs Abs: 3.4 10*3/uL (ref 0.7–4.0)
MCH: 30.4 pg (ref 26.0–34.0)
MCHC: 31.5 g/dL (ref 30.0–36.0)
MCV: 96.7 fL (ref 80.0–100.0)
Monocytes Absolute: 0.5 10*3/uL (ref 0.1–1.0)
Monocytes Relative: 8 %
Neutro Abs: 2 10*3/uL (ref 1.7–7.7)
Neutrophils Relative %: 34 %
Platelets: UNDETERMINED 10*3/uL (ref 150–400)
RBC: 4.24 MIL/uL (ref 3.87–5.11)
RDW: 13.7 % (ref 11.5–15.5)
WBC: 6 10*3/uL (ref 4.0–10.5)
nRBC: 0 % (ref 0.0–0.2)

## 2020-11-01 LAB — PROCALCITONIN: Procalcitonin: <0.1 ng/mL

## 2020-11-01 LAB — PROTIME-INR
INR: 0.9 (ref 0.8–1.2)
Prothrombin Time: 12.6 seconds (ref 11.4–15.2)

## 2020-11-01 LAB — TYPE AND SCREEN
ABO/RH(D): A POS
Antibody Screen: NEGATIVE

## 2020-11-01 LAB — MAGNESIUM: Magnesium: 2.1 mg/dL (ref 1.7–2.4)

## 2020-11-01 LAB — ABO/RH: ABO/RH(D): A POS

## 2020-11-01 LAB — BRAIN NATRIURETIC PEPTIDE: B Natriuretic Peptide: 15 pg/mL (ref 0.0–100.0)

## 2020-11-01 MED ORDER — IOHEXOL 9 MG/ML PO SOLN
500.0000 mL | ORAL | Status: AC
  Administered 2020-11-01 (×2): 500 mL via ORAL

## 2020-11-01 MED ORDER — THIAMINE HCL 100 MG PO TABS
100.0000 mg | ORAL_TABLET | Freq: Every day | ORAL | Status: DC
  Administered 2020-11-02 – 2020-11-03 (×2): 100 mg via ORAL
  Filled 2020-11-01 (×2): qty 1

## 2020-11-01 MED ORDER — BISACODYL 10 MG RE SUPP
10.0000 mg | Freq: Every day | RECTAL | Status: AC
  Administered 2020-11-01 – 2020-11-03 (×3): 10 mg via RECTAL
  Filled 2020-11-01 (×3): qty 1

## 2020-11-01 NOTE — Progress Notes (Signed)
LTM discontinued; no skin breakdown was seen. Notified [facility] of D/C. 

## 2020-11-01 NOTE — Plan of Care (Signed)
  Problem: Clinical Measurements: Goal: Complications related to the disease process, condition or treatment will be avoided or minimized Outcome: Progressing   Problem: Self-Concept: Goal: Level of anxiety will decrease Outcome: Progressing

## 2020-11-01 NOTE — Progress Notes (Signed)
PROGRESS NOTE                                                                                                                                                                                                             Patient Demographics:    [name], is a 55 y.o. female, DOB - [birth year], SEG:[id number]  Outpatient Primary MD for the patient is Mirna Mires, MD    LOS - 1  Admit date - 10/30/2020    Chief Complaint  Patient presents with  . Seizures  . Altered Mental Status       Brief Narrative (HPI from H&P) - [name] is a 55 y.o. female with medical history significant of intellectual disability, DM 2, HTN bipolar disorder, presented with possible seizure, increased confusion over past 2 wks   Subjective:   Patient in bed, appears comfortable, denies any headache, no fever, no chest pain or pressure, no shortness of breath , mild generalized abdominal pain. No new focal weakness.   Assessment  & Plan :     1. Metabolic Encephalopathy due to ? seizures - On Keppra, Neuro following, CT and MRI brain nonacute,  as needed IV Ativan and get EEG, encephalopathy has improved, no headache.  2.  Intellectual disability.  Supportive care.  3.  Bipolar disorder.  For now risperidone nightly.  4.  Dyslipidemia.  Placed on home dose statin.    5.  Generalized abdominal pain.  Xray and exam benign except for large stool burden, she does not localize the pain at this time, placed on bowel regimen we will also check CT scan abdomen pelvis.  6. DM type II.  on sliding scale as p.o. intake is inconsistent, does take moderate dose Lantus.  Lab Results  Component Value Date   HGBA1C 9.0 (H) 10/30/2020   CBG (last 3)  Recent Labs    11/01/20 0048 11/01/20 0437 11/01/20 0811  GLUCAP 145* 83 106*         Condition -  Guarded  Family Communication  :  Luanna Cole [phone] message left 10/31/20 @ 9.28  am, updated 11/01/20  Code Status :  Full  Consults  :  Neuro  PUD Prophylaxis : None   Procedures  :     EEG -   MRI and CT Head - Non acute  Disposition Plan  :    Status is:    Dispo: The patient is from: Home              Anticipated d/c is to: TBD              Patient currently is not medically stable to d/c.   Difficult to place patient No  DVT Prophylaxis  :    Place and maintain sequential compression device Start: 10/31/20 0708 SCDs Start: 10/31/20 0130     Lab Results  Component Value Date   PLT PLATELET CLUMPS NOTED ON SMEAR, UNABLE TO ESTIMATE 11/01/2020    Diet :  Diet Order            Diet clear liquid Room service appropriate? Yes; Fluid consistency: Thin  Diet effective now                  Inpatient Medications  Scheduled Meds: . bisacodyl  10 mg Rectal Daily  . docusate sodium  200 mg Oral BID  . insulin aspart  0-9 Units Subcutaneous Q4H  . levETIRAcetam  1,000 mg Oral BID  . polyethylene glycol  17 g Oral BID  . risperiDONE  2 mg Oral QHS  . simvastatin  20 mg Oral QHS  . thiamine injection  100 mg Intravenous Daily  . valACYclovir  500 mg Oral QHS   Continuous Infusions: . dextrose 5 % and 0.9% NaCl 75 mL/hr at 10/31/20 1035  . levETIRAcetam     PRN Meds:.acetaminophen **OR** acetaminophen, HYDROcodone-acetaminophen, LORazepam, traZODone  Antibiotics  :    Anti-infectives (From admission, onward)   Start     Dose/Rate Route Frequency Ordered Stop   10/31/20 2200  valACYclovir (VALTREX) tablet 500 mg        500 mg Oral Daily at bedtime 10/31/20 0932         Time Spent in minutes  30   Susa Raring M.D on 11/01/2020 at 8:34 AM  To page go to [email or web address]   [facility] -  Office  [phone]    See all Orders from today for further details    Objective:   Vitals:   11/01/20 0043 11/01/20 0045 11/01/20 0430 11/01/20 0800  BP: (!) 92/55  111/88 (!) 107/92  Pulse: 71  86 85  Resp: 11  15 16    Temp:  (!) 97.5 F (36.4 C) 97.6 F (36.4 C) 98.6 F (37 C)  TempSrc:  Oral Oral Oral  SpO2: 97%  98% 100%  Weight:      Height:        Wt Readings from Last 3 Encounters:  10/30/20 83 kg  10/28/20 83 kg  08/28/12 83 kg     Intake/Output Summary (Last 24 hours) at 11/01/2020 0834 Last data filed at 10/31/2020 1800 Gross per 24 hour  Intake 399.55 ml  Output --  Net 399.55 ml     Physical Exam  Awake Alert, No new F.N deficits,   [place].AT,PERRAL Supple Neck,No JVD, No cervical lymphadenopathy appriciated.  Symmetrical Chest wall movement, Good air movement bilaterally, CTAB RRR,No Gallops, Rubs or new Murmurs, No Parasternal Heave +ve B.Sounds, Abd Soft, No tenderness, No organomegaly appriciated, No rebound - guarding or rigidity. No Cyanosis, Clubbing or edema, No new Rash or bruise    Data Review:    CBC Recent Labs  Lab 10/28/20 2013 10/30/20 1925 10/31/20 1745 11/01/20 0207  WBC 9.1 9.1 8.3 6.0  HGB 12.2 13.5 14.3 12.9  HCT 36.4  40.7 43.0 41.0  PLT 339 403* 398 PLATELET CLUMPS NOTED ON SMEAR, UNABLE TO ESTIMATE  MCV 91.0 91.1 91.3 96.7  MCH 30.5 30.2 30.4 30.4  MCHC 33.5 33.2 33.3 31.5  RDW 12.9 13.0 13.2 13.7  LYMPHSABS 3.8 3.2 4.4* 3.4  MONOABS 0.6 0.6 0.5 0.5  EOSABS 0.1 0.0 0.1 0.1  BASOSABS 0.1 0.1 0.1 0.1    Recent Labs  Lab 10/28/20 2013 10/30/20 1925 10/30/20 2308 10/31/20 0500 10/31/20 0725 11/01/20 0207 11/01/20 0405  NA 135 140  --  139  --  139  --   K 3.5 3.5  --  3.8  --  4.0  --   CL 100 106  --  105  --  108  --   CO2 27 24  --  24  --  24  --   GLUCOSE 152* 95  --  119*  --  131*  --   BUN 14 7  --  6  --  <5*  --   CREATININE 0.77 0.81  --  0.65  --  0.63  --   CALCIUM 9.6 9.9  --  9.3  --  9.2  --   AST 59* 67*  --  63*  --  55*  --   ALT 58* 56*  --  52*  --  45*  --   ALKPHOS 68 70  --  65  --  58  --   BILITOT 0.7 0.9  --  1.0  --  1.5*  --   ALBUMIN 4.5 4.2  --  3.9  --  3.3*  --   MG  --  2.1  --  2.2  --  2.1   --   INR 0.9  --   --   --  1.0  --   --   TSH  --   --   --  1.732  --   --   --   HGBA1C  --   --  9.0*  --   --   --   --   AMMONIA 33  --   --   --   --   --   --   BNP  --   --   --   --   --   --  15.0    ------------------------------------------------------------------------------------------------------------------ No results for input(s): CHOL, HDL, LDLCALC, TRIG, CHOLHDL, LDLDIRECT in the last 72 hours.  Lab Results  Component Value Date   HGBA1C 9.0 (H) 10/30/2020   ------------------------------------------------------------------------------------------------------------------ Recent Labs    10/31/20 0500  TSH 1.732    Cardiac Enzymes No results for input(s): CKMB, TROPONINI, MYOGLOBIN in the last 168 hours.  Invalid input(s): CK ------------------------------------------------------------------------------------------------------------------    Component Value Date/Time   BNP 15.0 11/01/2020 0405    Micro Results Recent Results (from the past 240 hour(s))  Resp Panel by RT-PCR (Flu A&B, Covid) Nasopharyngeal Swab     Status: None   Collection Time: 10/28/20  9:24 PM   Specimen: Nasopharyngeal Swab; Nasopharyngeal(NP) swabs in vial transport medium  Result Value Ref Range Status   SARS Coronavirus 2 by RT PCR NEGATIVE NEGATIVE Final    Comment: (NOTE) SARS-CoV-2 target nucleic acids are NOT DETECTED.  The SARS-CoV-2 RNA is generally detectable in upper respiratory specimens during the acute phase of infection. The lowest concentration of SARS-CoV-2 viral copies this assay can detect is 138 copies/mL. A negative result does not preclude SARS-Cov-2 infection and should not be used  as the sole basis for treatment or other patient management decisions. A negative result may occur with  improper specimen collection/handling, submission of specimen other than nasopharyngeal swab, presence of viral mutation(s) within the areas targeted by this assay, and  inadequate number of viral copies(<138 copies/mL). A negative result must be combined with clinical observations, patient history, and epidemiological information. The expected result is Negative.  Fact Sheet for Patients:  BloggerCourse.com  Fact Sheet for Healthcare Providers:  SeriousBroker.it  This test is no t yet approved or cleared by the [place] FDA and  has been authorized for detection and/or diagnosis of SARS-CoV-2 by FDA under an Emergency Use Authorization (EUA). This EUA will remain  in effect (meaning this test can be used) for the duration of the COVID-19 declaration under Section 564(b)(1) of the Act, 21 U.S.C.section 360bbb-3(b)(1), unless the authorization is terminated  or revoked sooner.       Influenza A by PCR NEGATIVE NEGATIVE Final   Influenza B by PCR NEGATIVE NEGATIVE Final    Comment: (NOTE) The Xpert Xpress SARS-CoV-2/FLU/RSV plus assay is intended as an aid in the diagnosis of influenza from Nasopharyngeal swab specimens and should not be used as a sole basis for treatment. Nasal washings and aspirates are unacceptable for Xpert Xpress SARS-CoV-2/FLU/RSV testing.  Fact Sheet for Patients: BloggerCourse.com  Fact Sheet for Healthcare Providers: SeriousBroker.it  This test is not yet approved or cleared by the [place] FDA and has been authorized for detection and/or diagnosis of SARS-CoV-2 by FDA under an Emergency Use Authorization (EUA). This EUA will remain in effect (meaning this test can be used) for the duration of the COVID-19 declaration under Section 564(b)(1) of the Act, 21 U.S.C. section 360bbb-3(b)(1), unless the authorization is terminated or revoked.  Performed at [facility], [address] [place], [place] [address]   Resp Panel by RT-PCR (Flu A&B, Covid) Nasopharyngeal Swab     Status: None    Collection Time: 10/30/20 10:22 PM   Specimen: Nasopharyngeal Swab; Nasopharyngeal(NP) swabs in vial transport medium  Result Value Ref Range Status   SARS Coronavirus 2 by RT PCR NEGATIVE NEGATIVE Final    Comment: (NOTE) SARS-CoV-2 target nucleic acids are NOT DETECTED.  The SARS-CoV-2 RNA is generally detectable in upper respiratory specimens during the acute phase of infection. The lowest concentration of SARS-CoV-2 viral copies this assay can detect is 138 copies/mL. A negative result does not preclude SARS-Cov-2 infection and should not be used as the sole basis for treatment or other patient management decisions. A negative result may occur with  improper specimen collection/handling, submission of specimen other than nasopharyngeal swab, presence of viral mutation(s) within the areas targeted by this assay, and inadequate number of viral copies(<138 copies/mL). A negative result must be combined with clinical observations, patient history, and epidemiological information. The expected result is Negative.  Fact Sheet for Patients:  BloggerCourse.com  Fact Sheet for Healthcare Providers:  SeriousBroker.it  This test is no t yet approved or cleared by the [place] FDA and  has been authorized for detection and/or diagnosis of SARS-CoV-2 by FDA under an Emergency Use Authorization (EUA). This EUA will remain  in effect (meaning this test can be used) for the duration of the COVID-19 declaration under Section 564(b)(1) of the Act, 21 U.S.C.section 360bbb-3(b)(1), unless the authorization is terminated  or revoked sooner.       Influenza A by PCR NEGATIVE NEGATIVE Final   Influenza B by PCR NEGATIVE NEGATIVE Final  Comment: (NOTE) The Xpert Xpress SARS-CoV-2/FLU/RSV plus assay is intended as an aid in the diagnosis of influenza from Nasopharyngeal swab specimens and should not be used as a sole basis for treatment.  Nasal washings and aspirates are unacceptable for Xpert Xpress SARS-CoV-2/FLU/RSV testing.  Fact Sheet for Patients: BloggerCourse.com  Fact Sheet for Healthcare Providers: SeriousBroker.it  This test is not yet approved or cleared by the [place] FDA and has been authorized for detection and/or diagnosis of SARS-CoV-2 by FDA under an Emergency Use Authorization (EUA). This EUA will remain in effect (meaning this test can be used) for the duration of the COVID-19 declaration under Section 564(b)(1) of the Act, 21 U.S.C. section 360bbb-3(b)(1), unless the authorization is terminated or revoked.  Performed at [facility], [address] [place], [place] [address]     Radiology Reports DG Chest 1 View  Result Date: 10/31/2020 CLINICAL DATA:  Metal implant screen prior to MRI. EXAM: CHEST  1 VIEW COMPARISON:  None. FINDINGS: The heart size and mediastinal contours are within normal limits. Both lungs are clear. The visualized skeletal structures are unremarkable. IMPRESSION: No active disease. Electronically Signed   By: Maudry Mayhew MD   On: 10/31/2020 02:43   DG Abd 1 View  Result Date: 10/31/2020 CLINICAL DATA:  Metal implant screen prior to MRI. EXAM: ABDOMEN - 1 VIEW COMPARISON:  Abdominal ultrasound March 10, 2009. FINDINGS: The bowel gas pattern is normal. No radio-opaque calculi or other significant radiographic abnormality are seen. IMPRESSION: Negative. Electronically Signed   By: Maudry Mayhew MD   On: 10/31/2020 02:42   CT HEAD WO CONTRAST  Result Date: 10/30/2020 CLINICAL DATA:  Seizure EXAM: CT HEAD WITHOUT CONTRAST TECHNIQUE: Contiguous axial images were obtained from the base of the skull through the vertex without intravenous contrast. COMPARISON:  Oct 28, 2020. FINDINGS: Brain: Ventricles and sulci are normal in size and configuration. There is no intracranial mass, hemorrhage, extra-axial fluid  collection, or midline shift. Brain parenchyma appears unremarkable. No acute infarct is appreciable. Vascular: No hyperdense vessel.  No evident vascular calcification. Skull: Bony calvarium appears intact. Sinuses/Orbits: There is a retention cyst in the inferior right maxillary antrum. Other paranasal sinuses are clear. Orbits appear symmetric bilaterally. Other: Mastoid air cells are clear. IMPRESSION: Normal appearing brain parenchyma. No mass or hemorrhage. No evident acute infarct. Retention cyst inferior right maxillary antrum. Electronically Signed   By: Bretta Bang III M.D.   On: 10/30/2020 21:56   CT Head Wo Contrast  Result Date: 10/28/2020 CLINICAL DATA:  Mental status changes of unknown cause. EXAM: CT HEAD WITHOUT CONTRAST TECHNIQUE: Contiguous axial images were obtained from the base of the skull through the vertex without intravenous contrast. COMPARISON:  03/29/2007 FINDINGS: Brain: The brain has normal appearance without evidence of atrophy, old or acute infarction, mass lesion, hemorrhage, hydrocephalus or extra-axial collection. Vascular: Evident no abnormal vascular finding. Skull: Normal Sinuses/Orbits: Clear/normal Other: None IMPRESSION: Normal examination. No abnormality seen to explain mental status changes. Electronically Signed   By: Paulina Fusi M.D.   On: 10/28/2020 20:30   MR BRAIN W WO CONTRAST  Result Date: 10/31/2020 CLINICAL DATA:  Seizure with abnormal neuro exam EXAM: MRI HEAD WITHOUT AND WITH CONTRAST TECHNIQUE: Multiplanar, multiecho pulse sequences of the brain and surrounding structures were obtained without and with intravenous contrast. CONTRAST:  7.46mL GADAVIST GADOBUTROL 1 MMOL/ML IV SOLN COMPARISON:  10/30/2020 FINDINGS: Brain: No acute infarction, hemorrhage, hydrocephalus, extra-axial collection or mass lesion. No cortical finding to  explain seizure. No white matter disease, atrophy, or abnormal enhancement. Small remote left cerebellar infarct.  Vascular: Normal flow voids and vascular enhancements Skull and upper cervical spine: Normal marrow signal Sinuses/Orbits: Negative Other: Motion degradation which is intermittent and at times creates nondiagnostic slices. IMPRESSION: 1. Motion degraded brain MRI without acute finding. 2. No visible correlate for seizure. 3. Small remote left cerebellar infarct Electronically Signed   By: Marnee SpringJonathon  Watts M.D.   On: 10/31/2020 04:15   DG Chest Portable 1 View  Result Date: 10/28/2020 CLINICAL DATA:  Altered mental status. EXAM: PORTABLE CHEST 1 VIEW COMPARISON:  Remote radiograph 03/25/2009 FINDINGS: Lung volumes are low. Mild bibasilar atelectasis. Normal heart size for technique. No pulmonary edema, pleural effusion, or pneumothorax. No acute osseous abnormalities are seen. IMPRESSION: Low lung volumes with bibasilar atelectasis. Electronically Signed   By: Narda RutherfordMelanie  Sanford M.D.   On: 10/28/2020 20:20   DG Abd Portable 1V  Result Date: 10/31/2020 CLINICAL DATA:  Generalized abdominal pain.  Status post seizure. EXAM: PORTABLE ABDOMEN - 1 VIEW COMPARISON:  10/31/2020 FINDINGS: The bowel gas pattern is nonobstructed. No dilated loops of large or small bowel. Moderate stool burden identified within the right colon. IMPRESSION: 1. Nonobstructive bowel gas pattern. Electronically Signed   By: Signa Kellaylor  Stroud M.D.   On: 10/31/2020 11:35

## 2020-11-01 NOTE — Progress Notes (Signed)
Physical Therapy Cancellation    11/01/20 0900  PT Visit Information  Last PT Received On 11/01/20  Reason Eval/Treat Not Completed Patient at procedure or test/unavailable   Patient undergoing EEG. Will follow-up as schedule permits.   Jerolyn Center, PT Pager [phone]

## 2020-11-01 NOTE — Plan of Care (Signed)

## 2020-11-01 NOTE — Evaluation (Signed)
Occupational Therapy Evaluation Patient Details Name: Marissa [name] MRN: [id number] DOB: [birth year] Today's Date: 11/01/2020    History of Present Illness 55 y.o. female presenting to ED with possible seizure and increased confusion over last 2 weeks. PMH includes intellectual disability, DM 2, HTN bipolar disorder. CT head and CXR non-acute findings.   Clinical Impression   Pt with hx of intellectual disability and admitted with increased confusion, unsure accuracy of report provided by pt, but information matches with information from previous admission. PTA, pt was living with her sister, pt reports she was independent with ADL and her sister assisted with IADL and pt reports independence with functional mobility without use of AD. Pt currently requires minguard assistance for LB ADL, setup assistance for grooming, minguard assistance for sit<>stand transfer and side steps along EOB. Pt incontinent of bowels and required assistance for posterior care while standing. Pt endorses stomach pain, declining further mobility.  Due to decline in current level of function, pt would benefit from acute OT to address established goals to facilitate safe D/C to venue listed below. At this time, recommend HHOT with 24/7 supervision/assistance follow-up. Will continue to follow acutely.     Follow Up Recommendations  Home health OT;Supervision/Assistance - 24 hour    Equipment Recommendations  3 in 1 bedside commode    Recommendations for Other Services       Precautions / Restrictions Precautions Precautions: Fall Restrictions Weight Bearing Restrictions: No      Mobility Bed Mobility Overal bed mobility: Needs Assistance Bed Mobility: Supine to Sit;Sit to Supine     Supine to sit: Min guard Sit to supine: Min guard   General bed mobility comments: vc for progressing BLE off EOB. When pt initially prompted to sit EOB she sat in long sitting    Transfers Overall transfer level: Needs  assistance Equipment used: None Transfers: Sit to/from Stand Sit to Stand: Min guard         General transfer comment: minguard for safety;pt took lateral side steps along EOB    Balance Overall balance assessment: Needs assistance Sitting-balance support: No upper extremity supported Sitting balance-Leahy Scale: Fair     Standing balance support: During functional activity;No upper extremity supported;Bilateral upper extremity supported Standing balance-Leahy Scale: Fair Standing balance comment: mild instability noted without UE support, no loss of balance                           ADL either performed or assessed with clinical judgement   ADL Overall ADL's : Needs assistance/impaired Eating/Feeding: Set up;Sitting   Grooming: Set up;Sitting   Upper Body Bathing: Min guard;Sitting   Lower Body Bathing: Min guard;Sit to/from stand   Upper Body Dressing : Set up;Sitting   Lower Body Dressing: Min guard;Sit to/from stand       Toileting- Architect and Hygiene: Maximal assistance Toileting - Clothing Manipulation Details (indicate cue type and reason): pt incontinent of bowels this session, pt unaware. She required assistance for posterior care RN aware     Functional mobility during ADLs: Min guard General ADL Comments: pt limited by cognition, incontinence, and decreased activity tolerance and pain. Pt tolerated standing x39min for posterior care. She took side steps along EOB with minguard assistance for safety and stability. further mobility deferred as pt declined secondary to pain     Vision         Perception     Praxis      Pertinent  Vitals/Pain Pain Assessment: Faces Faces Pain Scale: Hurts a little bit Pain Location: pt endorses stomach pain, did not quantify Pain Descriptors / Indicators: Discomfort;Moaning Pain Intervention(s): Limited activity within patient's tolerance;Monitored during session     Hand Dominance Right    Extremity/Trunk Assessment Upper Extremity Assessment Upper Extremity Assessment: Generalized weakness (strenth and sensation symmetrical)   Lower Extremity Assessment Lower Extremity Assessment: Overall WFL for tasks assessed   Cervical / Trunk Assessment Cervical / Trunk Assessment: Normal   Communication     Cognition Arousal/Alertness: Awake/[name] Behavior During Therapy: Flat affect Overall Cognitive Status: No family/caregiver present to determine baseline cognitive functioning                                 General Comments: hx of intellectual disability. Pt follows one step commands inconsistently, requires multimodal cues and increased time. Pt oriented to self, place, year. When prompted for the month she reports it is 2022   General Comments  VSS BP 123/60    Exercises     Shoulder Instructions      Home Living Family/patient expects to be discharged to:: Private residence Living Arrangements: Other relatives (sister) Available Help at Discharge: Family (sister) Type of Home: House Home Access: Level entry     Home Layout: One level     Bathroom Shower/Tub: Walk-in shower             Additional Comments: information per pt, matches information in the chart      Prior Functioning/Environment Level of Independence: Needs assistance  Gait / Transfers Assistance Needed: pt reports she ambulates without AD ADL's / Homemaking Assistance Needed: pt reports her sister assists with IADL (cooking, cleaning, meds), but pt is independent with ADL (dressing, bathing, grooming, etc)            OT Problem List: Decreased strength;Decreased range of motion;Decreased activity tolerance;Impaired balance (sitting and/or standing);Decreased safety awareness;Decreased cognition;Pain      OT Treatment/Interventions: Self-care/ADL training;Therapeutic exercise;Energy conservation;DME and/or AE instruction;Therapeutic activities;Cognitive  remediation/compensation;Patient/family education;Balance training    OT Goals(Current goals can be found in the care plan section) Acute Rehab OT Goals Patient Stated Goal: to sleep OT Goal Formulation: With patient Time For Goal Achievement: 11/15/20 Potential to Achieve Goals: Good ADL Goals Pt Will Perform Grooming: standing;with supervision Pt Will Perform Lower Body Dressing: sit to/from stand;with supervision Pt Will Transfer to Toilet: with supervision;ambulating  OT Frequency: Min 2X/week   Barriers to D/C:            Co-evaluation              AM-PAC OT "6 Clicks" Daily Activity     Outcome Measure Help from another person eating meals?: A Little Help from another person taking care of personal grooming?: A Little Help from another person toileting, which includes using toliet, bedpan, or urinal?: A Little Help from another person bathing (including washing, rinsing, drying)?: A Little Help from another person to put on and taking off regular upper body clothing?: A Little Help from another person to put on and taking off regular lower body clothing?: A Little 6 Click Score: 18   End of Session Nurse Communication: Mobility status (bowel incontinence)  Activity Tolerance: Patient tolerated treatment well;Patient limited by pain Patient left: in bed;with call bell/phone within reach;with bed alarm set  OT Visit Diagnosis: Unsteadiness on feet (R26.81);Other abnormalities of gait and mobility (R26.89);Muscle weakness (generalized) (M62.81);Other symptoms  and signs involving cognitive function;Pain Pain - part of body:  (stomach)                Time: 7829-5621 OT Time Calculation (min): 20 min Charges:  OT General Charges $OT Visit: 1 Visit OT Evaluation $OT Eval Moderate Complexity: 1 Mod  Marissa [name] OTR/L Acute Rehabilitation Services Office: [phone]   Marissa [name] 11/01/2020, 2:12 PM

## 2020-11-01 NOTE — Plan of Care (Signed)
  Problem: Clinical Measurements: Goal: Respiratory complications will improve Outcome: Progressing   Problem: Clinical Measurements: Goal: Cardiovascular complication will be avoided Outcome: Progressing   Problem: Pain Managment: Goal: General experience of comfort will improve Outcome: Progressing   Problem: Safety: Goal: Ability to remain free from injury will improve Outcome: Progressing   Problem: Health Behavior/Discharge Planning: Goal: Compliance with prescribed medication regimen will improve Outcome: Progressing

## 2020-11-01 NOTE — Evaluation (Signed)
Clinical/Bedside Swallow Evaluation Patient Details  Name: [name] MRN: [id number] Date of Birth: [birth year]  Today's Date: 11/01/2020 Time: SLP Start Time (ACUTE ONLY): 0919 SLP Stop Time (ACUTE ONLY): 0935 SLP Time Calculation (min) (ACUTE ONLY): 15.95 min  Past Medical History:  Past Medical History:  Diagnosis Date  . Diabetes mellitus without complication (HCC)   . Hypertension   . Immune deficiency disorder (HCC)   . Mild mental retardation    Past Surgical History: History reviewed. No pertinent surgical history. HPI:  Pt is a 55 y.o. female who presented with possible seizure and increased confusion 2 wks prior to admission. PMH: intellectual disability, DM 2, HTN bipolar disorder. Dx Metabolic Encephalopathy.  CT head and CXR non-acute findings.   Assessment / Plan / Recommendation Clinical Impression  Pt was seen for bedside swallow evaluation and she denied a history of dysphagia. Oral mechanism exam was [facility] and dentition was adequate. She tolerated all solids and liquids without signs or symptoms of oropharyngeal dysphagia. A regular texture diet with thin liquids is recommended at this time from an oropharyngeal swallowing standpoint. However, pt's case was discussed with Dr. Burney Gauze and it was agreed that the pt will be left on the clear liquid diet pending results of CT abdomen and that she may be advanced to regular if clinically indicated thereafter. SLP will see pt once more to ensure diet tolerance, but it is anticipated that SLP services will not be needed thereafter. SLP Visit Diagnosis: Dysphagia, unspecified (R13.10)    Aspiration Risk  Mild aspiration risk    Diet Recommendation Regular;Thin liquid   Liquid Administration via: Cup;Straw Medication Administration: Whole meds with liquid Postural Changes: Seated upright at 90 degrees    Other  Recommendations Oral Care Recommendations: Oral care BID   Follow up Recommendations None      Frequency  and Duration min 1 x/week  1 week       Prognosis Barriers to Reach Goals: Cognitive deficits      Swallow Study   General Date of Onset: 10/31/20 HPI: Pt is a 55 y.o. female who presented with possible seizure and increased confusion 2 wks prior to admission. PMH: intellectual disability, DM 2, HTN bipolar disorder. Dx Metabolic Encephalopathy.  CT head and CXR non-acute findings. Type of Study: Bedside Swallow Evaluation Previous Swallow Assessment: none Diet Prior to this Study: Thin liquids Temperature Spikes Noted: No Respiratory Status: Room air History of Recent Intubation: No Behavior/Cognition: Alert;Pleasant mood;Cooperative;Confused Oral Cavity Assessment: Within Functional Limits Oral Care Completed by SLP: No Oral Cavity - Dentition: Adequate natural dentition Vision: Functional for self-feeding Self-Feeding Abilities: Able to feed self Patient Positioning: Upright in bed;Postural control adequate for testing Baseline Vocal Quality: Normal Volitional Cough: Strong Volitional Swallow: Able to elicit    Oral/Motor/Sensory Function Overall Oral Motor/Sensory Function: Within functional limits   Ice Chips Ice chips: Within functional limits Presentation: Spoon   Thin Liquid Thin Liquid: Within functional limits Presentation: Cup;Straw    Nectar Thick Nectar Thick Liquid: Not tested   Honey Thick Honey Thick Liquid: Not tested   Puree Puree: Within functional limits Presentation: Spoon   Solid     Solid: Within functional limits Presentation: Self Fed     [name] I. Vear Clock, MS, CCC-SLP Acute Rehabilitation Services Office number [phone] Pager [phone]  Scheryl Marten 11/01/2020,9:35 AM

## 2020-11-01 NOTE — Procedures (Addendum)
Patient Name: [name]  MRN: [id number]  Epilepsy Attending: Charlsie Quest  Referring Physician/Provider: Dr. Onalee Hua Duration: 10/31/2020 1611 to 15/16/2022 0945  Patient history: 55 year old female with mild mental retardation who presents with new onset seizures.  EEG to evaluate for seizures.  Level of alertness: Awake, asleep  AEDs during EEG study: Keppra  Technical aspects: This EEG study was done with scalp electrodes positioned according to the 10-20 International system of electrode placement. Electrical activity was acquired at a sampling rate of 500Hz  and reviewed with a high frequency filter of 70Hz  and a low frequency filter of 1Hz . EEG data were recorded continuously and digitally stored.   Description: The posterior dominant rhythm consists of 9-10 Hz activity of moderate voltage (25-35 uV) seen predominantly in posterior head regions, symmetric and reactive to eye opening and eye closing. Sleep was characterized by vertex waves, sleep spindles (12 to 14 Hz), maximal frontocentral region. Hyperventilation and photic stimulation were not performed.     IMPRESSION: This study is within normal limits. No seizures or epileptiform discharges were seen throughout the recording.  Skylie Hiott 

## 2020-11-01 NOTE — Progress Notes (Signed)
Neurology Progress Note  Subjective: Attending provider at bedside during examination who notes much improvement in mental status since hospital arrival.  Patient with complaints of abdominal pain this morning- per attending patient with constipation and trouble with BM x 2 weeks.  Exam: Vitals:   11/01/20 0430 11/01/20 0800  BP: 111/88   Pulse: 86   Resp: 15   Temp: 97.6 F (36.4 C)   SpO2: 98% 100%   Gen: In bed, asleep, in no acute distress Resp: non-labored breathing, no respiratory distress Abd: soft, non-distended  Neuro: Mental Status: Patient initially asleep on examination, wakes to voice. She is alert and oriented to self, age, and place. She perseverates on "my stomach hurts" on examination this morning. She initially states that the year is "twenty-one" when asked the month she states "twenty-two" repeatedly. Poor attention noted. Speech is intact without dysarthria or aphasia. Naming and repetition are intact. No neglect is noted. Cranial Nerves:  PERRL 52m / brisk, visual fields full, facial sensation intact and symmetric to light touch, face is symmetric resting and smiling, hearing is intact to voice, phonation intact, palate elevates symmetrically, tongue protrudes midline without fasciculations. Motor: Moves all extremities well, able to sustain antigravity movement without vertical drift throughout. 4/5 strength present in bilateral upper and lower extremities. Examination slightly limited by poor attention. Sensory: Sensation to light touch intact and symmetric in bilateral upper and lower extremities.  DTR: 2+ and symmetric bilateral biceps and patellae  Pertinent Labs: CBC    Component Value Date/Time   WBC 6.0 11/01/2020 0207   RBC 4.24 11/01/2020 0207   HGB 12.9 11/01/2020 0207   HCT 41.0 11/01/2020 0207   PLT PLATELET CLUMPS NOTED ON SMEAR, UNABLE TO ESTIMATE 11/01/2020 0207   MCV 96.7 11/01/2020 0207   MCH 30.4 11/01/2020 0207   MCHC 31.5 11/01/2020  0207   RDW 13.7 11/01/2020 0207   LYMPHSABS 3.4 11/01/2020 0207   MONOABS 0.5 11/01/2020 0207   EOSABS 0.1 11/01/2020 0207   BASOSABS 0.1 11/01/2020 0207   CMP     Component Value Date/Time   NA 139 11/01/2020 0207   K 4.0 11/01/2020 0207   CL 108 11/01/2020 0207   CO2 24 11/01/2020 0207   GLUCOSE 131 (H) 11/01/2020 0207   BUN <5 (L) 11/01/2020 0207   CREATININE 0.63 11/01/2020 0207   CALCIUM 9.2 11/01/2020 0207   PROT 6.2 (L) 11/01/2020 0207   ALBUMIN 3.3 (L) 11/01/2020 0207   AST 55 (H) 11/01/2020 0207   ALT 45 (H) 11/01/2020 0207   ALKPHOS 58 11/01/2020 0207   BILITOT 1.5 (H) 11/01/2020 0207   GFRNONAA >60 11/01/2020 0207   GFRAA  03/24/2009 0911    >60        The eGFR has been calculated using the MDRD equation. This calculation has not been validated in all clinical situations. eGFR's persistently <60 mL/min signify possible Chronic Kidney Disease.   Urinalysis    Component Value Date/Time   COLORURINE YELLOW 10/31/2020 1426   APPEARANCEUR CLEAR 10/31/2020 1426   LABSPEC 1.017 10/31/2020 1426   PHURINE 5.0 10/31/2020 1426   GLUCOSEU NEGATIVE 10/31/2020 1426   HGBUR NEGATIVE 10/31/2020 1426   BILIRUBINUR NEGATIVE 10/31/2020 1426   KETONESUR 5 (A) 10/31/2020 1426   PROTEINUR NEGATIVE 10/31/2020 1426   UROBILINOGEN 0.2 04/27/2010 1438   NITRITE NEGATIVE 10/31/2020 1426   LEUKOCYTESUR NEGATIVE 10/31/2020 1426   Drugs of Abuse     Component Value Date/Time   LABOPIA POSITIVE (A) 10/31/2020 1427  COCAINSCRNUR NONE DETECTED 10/31/2020 1427   LABBENZ NONE DETECTED 10/31/2020 1427   AMPHETMU NONE DETECTED 10/31/2020 1427   THCU NONE DETECTED 10/31/2020 1427   LABBARB NONE DETECTED 10/31/2020 1427   Imaging Reviewed:  EEG Overnight 5/15 - 5/16: "This study is within normal limits. No seizures or epileptiform discharges were seen throughout the recording."  MRI Brain 5/15: "1. Motion degraded brain MRI without acute finding. 2. No visible correlate for  seizure. 3. Small remote left cerebellar infarct"  CT Head 10/30/2020:  "Normal appearing brain parenchyma. No mass or hemorrhage. No evident acute infarct. Retention cyst inferior right maxillary antrum."  Impression: [name] is a 55 y.o. female with PMH significant for DM2, HTN, mild mental retardation who presented 5/15 with frequent recurrent seizures. Initial neurologic examination was notable for notable for somnolence but would wake to follow commands. cEEG obtained to monitor for concern for frequent/recurrent subclinical seizures. - Examination 5/16 with much improvement in mental status. She wakes to voice, follows commands, and is able to communicate with examiner to identify orientation to self and place.  - cEEG within normal limits without seizures or epileptiform discharges. MRI motion degraded, but within that limitation, without acute abnormality.  - Presentation possibly a seizure in the setting of discontinuation of Depakote in August with improvement in mental status with Keppra initiation.   Recommendations: - Continue Keppra 1,000 mg BID PO or IV - Maintain seizure precautions while inpatient - Discontinue EEG - Ativan 64m for seizure activity lasting more than 2 mins. Notify neurology immediately after administering Ativan. -Management and evaluation of abdominal pain per primary team as you are.  - Outpatient seizure precautions: [facility], patients with seizures are not allowed to drive until they have been seizure-free for six months (patient does not drive at baseline). Use caution when using heavy equipment or power tools. Avoid working on ladders or at heights. Take showers instead of baths. Ensure the water temperature is not too high on the home water heater. Do not go swimming alone. Do not lock yourself in a room alone (i.e. bathroom). When caring for infants or small children, sit down when holding, feeding, or changing them to minimize  risk of injury to the child in the event you have a seizure. Maintain good sleep hygiene. Avoid alcohol.  - Neurology will be available on an as needed basis for further questions or concerns  SAnibal [name] AGACNP-BC [facility] [phone]  Attending Neurohospitalist Addendum Patient seen and examined with APP/Resident. Agree with the history and physical as documented above. Agree with the plan as documented, which I helped formulate. I have independently reviewed the chart, obtained history, review of systems and examined the patient.I have personally reviewed pertinent head/neck/spine imaging (CT/MRI). Please feel free to call with any questions.  -- AAmie Portland MD Neurologist [facility] Pager: [phone]

## 2020-11-01 NOTE — Progress Notes (Signed)
Inpatient Diabetes Program Recommendations  AACE/ADA: New Consensus Statement on Inpatient Glycemic Control (2015)  Target Ranges:  Prepandial:   less than 140 mg/dL      Peak postprandial:   less than 180 mg/dL (1-2 hours)      Critically ill patients:  140 - 180 mg/dL   Results for PRAISE, STENNETT (MRN [id number]) as of 11/01/2020 07:04  Ref. Range 10/31/2020 00:03 10/31/2020 01:05 10/31/2020 02:19 10/31/2020 03:53 10/31/2020 04:54 10/31/2020 06:18 10/31/2020 08:16 10/31/2020 10:34 10/31/2020 13:14 10/31/2020 14:00 10/31/2020 14:33 10/31/2020 21:52  Glucose-Capillary Latest Ref Range: 70 - 99 mg/dL 65 (L) 097 (H) 74 66 (L) 123 (H) 97 143 (H) 123 (H) 84 79 132 (H) 89   Results for TYWANDA, RICE (MRN [id number]) as of 11/01/2020 07:04  Ref. Range 11/01/2020 00:48 11/01/2020 04:37  Glucose-Capillary Latest Ref Range: 70 - 99 mg/dL 683 (H)  1 unit NOVOLOG  83   Results for GEORGEANNE, FRANKLAND (MRN [id number]) as of 11/01/2020 07:04  Ref. Range 10/30/2020 23:08  Hemoglobin A1C Latest Ref Range: 4.8 - 5.6 % 9.0 (H)  (211 mg/dl)    Admit with: AMS/ Seizures   History: DM, Bipolar d/o, Intellectual Disability  Home DM Meds: Amaryl 2 mg Daily       Novolog 1-7 units TID per SSI       Lantus 40 units QHS       Metformin 500 mg BID  Current Orders: Novolog Sensitive Correction Scale/ SSI (0-9 units) Q4 hours    Note patient was having issues with Hypoglycemia yesterday--D10% IVF started and then stopped yest at 10am  CBGs have been stable since 12pm yesterday (05/15)--Off long-acting insulin in hospital at present  Patient was living in group home per MD notes but now living with sister     --Will follow patient during hospitalization--  Ambrose Finland RN, MSN, CDE Diabetes Coordinator Inpatient Glycemic Control Team Team Pager: [phone] (8a-5p)

## 2020-11-02 DIAGNOSIS — E118 Type 2 diabetes mellitus with unspecified complications: Secondary | ICD-10-CM | POA: Diagnosis not present

## 2020-11-02 LAB — COMPREHENSIVE METABOLIC PANEL
ALT: 46 U/L — ABNORMAL HIGH (ref 0–44)
AST: 46 U/L — ABNORMAL HIGH (ref 15–41)
Albumin: 3.1 g/dL — ABNORMAL LOW (ref 3.5–5.0)
Alkaline Phosphatase: 60 U/L (ref 38–126)
Anion gap: 6 (ref 5–15)
BUN: 6 mg/dL (ref 6–20)
CO2: 26 mmol/L (ref 22–32)
Calcium: 9 mg/dL (ref 8.9–10.3)
Chloride: 109 mmol/L (ref 98–111)
Creatinine, Ser: 0.75 mg/dL (ref 0.44–1.00)
GFR, Estimated: >60 mL/min (ref >60)
Glucose, Bld: 164 mg/dL — ABNORMAL HIGH (ref 70–99)
Potassium: 3.6 mmol/L (ref 3.5–5.1)
Sodium: 141 mmol/L (ref 135–145)
Total Bilirubin: 0.8 mg/dL (ref 0.3–1.2)
Total Protein: 6.4 g/dL — ABNORMAL LOW (ref 6.5–8.1)

## 2020-11-02 LAB — CBC WITH DIFFERENTIAL/PLATELET
Abs Immature Granulocytes: 0.02 10*3/uL (ref 0.00–0.07)
Basophils Absolute: 0.1 10*3/uL (ref 0.0–0.1)
Basophils Relative: 1 %
Eosinophils Absolute: 0.1 10*3/uL (ref 0.0–0.5)
Eosinophils Relative: 2 %
HCT: 41.1 % (ref 36.0–46.0)
Hemoglobin: 13.5 g/dL (ref 12.0–15.0)
Immature Granulocytes: 0 %
Lymphocytes Relative: 45 %
Lymphs Abs: 3.6 10*3/uL (ref 0.7–4.0)
MCH: 30.5 pg (ref 26.0–34.0)
MCHC: 32.8 g/dL (ref 30.0–36.0)
MCV: 92.8 fL (ref 80.0–100.0)
Monocytes Absolute: 0.5 10*3/uL (ref 0.1–1.0)
Monocytes Relative: 7 %
Neutro Abs: 3.5 10*3/uL (ref 1.7–7.7)
Neutrophils Relative %: 45 %
Platelets: 376 10*3/uL (ref 150–400)
RBC: 4.43 MIL/uL (ref 3.87–5.11)
RDW: 13.5 % (ref 11.5–15.5)
WBC: 7.8 10*3/uL (ref 4.0–10.5)
nRBC: 0 % (ref 0.0–0.2)

## 2020-11-02 LAB — GLUCOSE, CAPILLARY
Glucose-Capillary: 156 mg/dL — ABNORMAL HIGH (ref 70–99)
Glucose-Capillary: 156 mg/dL — ABNORMAL HIGH (ref 70–99)
Glucose-Capillary: 188 mg/dL — ABNORMAL HIGH (ref 70–99)
Glucose-Capillary: 219 mg/dL — ABNORMAL HIGH (ref 70–99)
Glucose-Capillary: 292 mg/dL — ABNORMAL HIGH (ref 70–99)
Glucose-Capillary: 324 mg/dL — ABNORMAL HIGH (ref 70–99)

## 2020-11-02 LAB — BRAIN NATRIURETIC PEPTIDE: B Natriuretic Peptide: 8.6 pg/mL (ref 0.0–100.0)

## 2020-11-02 LAB — PROCALCITONIN: Procalcitonin: <0.1 ng/mL

## 2020-11-02 LAB — MAGNESIUM: Magnesium: 2.1 mg/dL (ref 1.7–2.4)

## 2020-11-02 MED ORDER — MAGNESIUM HYDROXIDE 400 MG/5ML PO SUSP
30.0000 mL | Freq: Two times a day (BID) | ORAL | Status: AC
  Administered 2020-11-02 (×2): 30 mL via ORAL
  Filled 2020-11-02 (×2): qty 30

## 2020-11-02 NOTE — Progress Notes (Signed)
Physical Therapy Treatment Patient Details Name: [name] MRN: [id number] DOB: 1965/10/27 Today's Date: 11/02/2020    History of Present Illness 55 y.o. female presenting to ED with possible seizure and increased confusion over last 2 weeks. PMH includes intellectual disability, DM 2, HTN bipolar disorder. CT head and CXR non-acute findings.    PT Comments    Patient quiet, but able to follow commands. Required incr cues to understand to come to sit at EOB, but able to complete the task without physical assist. Transfers with min guard assist for safety without imbalance (improved from prior visit). Ambulated 200 ft with no device with minguard assist for safety, without imbalance. Noted pt will go home with her sister. Patient unable to clarify if sister provides 24/7 care or if she will be home alone at any time.     Follow Up Recommendations  Outpatient PT;Supervision/Assistance - 24 hour     Equipment Recommendations  None recommended by PT    Recommendations for Other Services       Precautions / Restrictions Precautions Precautions: Fall    Mobility  Bed Mobility Overal bed mobility: Needs Assistance Bed Mobility: Supine to Sit;Sit to Supine     Supine to sit: Supervision     General bed mobility comments: vc for progressing BLE off EOB. When pt initially prompted to sit EOB she sat in long sitting    Transfers Overall transfer level: Needs assistance Equipment used: None Transfers: Sit to/from Stand Sit to Stand: Min guard         General transfer comment: minguard for safety  Ambulation/Gait Ambulation/Gait assistance: Min guard Gait Distance (Feet): 200 Feet Assistive device: None Gait Pattern/deviations: Step-through pattern;Decreased stride length Gait velocity: decreased   General Gait Details: steady during gait; LUE stays in flexed position with pt able to partially extend while walking (able to extend fully when sitting and resting); pt  able to partially look left and right while walking (does not fully turn her head either direction and slightly slows velocity)   Stairs             Wheelchair Mobility    Modified Rankin (Stroke Patients Only)       Balance Overall balance assessment: Needs assistance Sitting-balance support: No upper extremity supported Sitting balance-Leahy Scale: Fair     Standing balance support: During functional activity;No upper extremity supported;Bilateral upper extremity supported Standing balance-Leahy Scale: Fair                              Cognition Arousal/Alertness: Awake/alert Behavior During Therapy: Flat affect Overall Cognitive Status: No family/caregiver present to determine baseline cognitive functioning                                 General Comments: hx of intellectual disability. Pt follows one step commands  Pt oriented to self, place, year.      Exercises      General Comments        Pertinent Vitals/Pain Pain Assessment: No/denies pain    Home Living                      Prior Function            PT Goals (current goals can now be found in the care plan section) Acute Rehab PT Goals Patient Stated Goal: to sleep  Time For Goal Achievement: 11/14/20 Potential to Achieve Goals: Good Progress towards PT goals: Progressing toward goals    Frequency    Min 3X/week      PT Plan Current plan remains appropriate    Co-evaluation              AM-PAC PT "6 Clicks" Mobility   Outcome Measure  Help needed turning from your back to your side while in a flat bed without using bedrails?: None Help needed moving from lying on your back to sitting on the side of a flat bed without using bedrails?: A Little Help needed moving to and from a bed to a chair (including a wheelchair)?: A Little Help needed standing up from a chair using your arms (e.g., wheelchair or bedside chair)?: A Little Help needed to  walk in hospital room?: A Little Help needed climbing 3-5 steps with a railing? : A Little 6 Click Score: 19    End of Session Equipment Utilized During Treatment: Gait belt Activity Tolerance: Patient tolerated treatment well Patient left: with call bell/phone within reach;in chair;with chair alarm set Nurse Communication: Mobility status PT Visit Diagnosis: Unsteadiness on feet (R26.81);Other abnormalities of gait and mobility (R26.89);Pain     Time: 7741-2878 PT Time Calculation (min) (ACUTE ONLY): 17 min  Charges:  $Therapeutic Activity: 8-22 mins                      Jerolyn Center, PT Pager [phone]    Zena Amos 11/02/2020, 4:14 PM

## 2020-11-02 NOTE — Progress Notes (Signed)
PROGRESS NOTE                                                                                                                                                                                                             Patient Demographics:    [name], is a 55 y.o. female, DOB - Jun 21, 1965, TYO:[id number]  Outpatient Primary MD for the patient is Mirna Mires, MD    LOS - 2  Admit date - 10/30/2020    Chief Complaint  Patient presents with  . Seizures  . Altered Mental Status       Brief Narrative (HPI from H&P) - [name] is a 55 y.o. female with medical history significant of intellectual disability, DM 2, HTN bipolar disorder, presented with possible seizure, increased confusion over past 2 wks, seen by neuro here so far appears to have had seizure at home.  EEG thus far unrevealing   Subjective:   Much more alert awake talking to her sister, no headache chest pain, no shortness of breath, no focal weakness, mild generalized abdominal pain, 1 bowel movement yesterday no nausea.   Assessment  & Plan :     1. Metabolic Encephalopathy due to ? seizures - On Keppra, Neuro following, CT and MRI brain nonacute, EEG so far nonacute, appears to be stable for now and close to her baseline, no headache or new focal deficits.  2.  Intellectual disability.  Supportive care.  3.  Bipolar disorder.  For now risperidone nightly.  4.  Dyslipidemia.  Placed on home dose statin.    5.  Generalized abdominal pain.  Xray and exam benign except for large stool burden, she does not localize the pain at this time, placed on bowel regimen with 1 bowel movement and improvement in pain, CT scan abdomen pelvis nonacute, continue to monitor.  6. DM type II.  on sliding scale as p.o. intake is inconsistent, does take moderate dose Lantus.  Lab Results  Component Value Date   HGBA1C 9.0 (H) 10/30/2020   CBG (last 3)  Recent  Labs    11/02/20 0023 11/02/20 0415 11/02/20 0823  GLUCAP 188* 156* 156*         Condition -  Guarded  Family Communication  :  Luanna Cole [phone] message left 10/31/20 @ 9.28 am, updated 11/01/20, bedside 11/02/20, sister  would like to take her home in the next 1 to 2 days, she is still not back to her baseline according to the sister  Code Status :  Full  Consults  :  Neuro  PUD Prophylaxis : None   Procedures  :     EEG - This study is within normal limits. No seizures or epileptiform discharges were seen throughout the recording.      MRI and CT Head - Non acute      Disposition Plan  :    Status is:    Dispo: The patient is from: Home              Anticipated d/c is to: TBD              Patient currently is not medically stable to d/c.   Difficult to place patient No  DVT Prophylaxis  :    Place and maintain sequential compression device Start: 10/31/20 0708 SCDs Start: 10/31/20 0130     Lab Results  Component Value Date   PLT 376 11/02/2020    Diet :  Diet Order            DIET SOFT Room service appropriate? Yes; Fluid consistency: Thin  Diet effective now                  Inpatient Medications  Scheduled Meds: . bisacodyl  10 mg Rectal Daily  . docusate sodium  200 mg Oral BID  . insulin aspart  0-9 Units Subcutaneous Q4H  . levETIRAcetam  1,000 mg Oral BID  . polyethylene glycol  17 g Oral BID  . risperiDONE  2 mg Oral QHS  . simvastatin  20 mg Oral QHS  . thiamine  100 mg Oral Daily  . valACYclovir  500 mg Oral QHS   Continuous Infusions:  PRN Meds:.acetaminophen **OR** acetaminophen, HYDROcodone-acetaminophen, LORazepam, traZODone  Antibiotics  :    Anti-infectives (From admission, onward)   Start     Dose/Rate Route Frequency Ordered Stop   10/31/20 2200  valACYclovir (VALTREX) tablet 500 mg        500 mg Oral Daily at bedtime 10/31/20 0932         Time Spent in minutes  30   Susa Raring M.D on 11/02/2020 at 8:25  AM  To page go to [email or web address]   [facility] -  Office  [phone]    See all Orders from today for further details    Objective:   Vitals:   11/01/20 2054 11/02/20 0021 11/02/20 0409 11/02/20 0705  BP: (!) 127/57 106/67 110/62   Pulse: 95 90 75   Resp: Temp: 98 F (36.7 C) 98.5 F (36.9 C) 97.9 F (36.6 C)   TempSrc: Oral Oral Oral   SpO2: 95% 97% 99% 97%  Weight:      Height:        Wt Readings from Last 3 Encounters:  10/30/20 83 kg  10/28/20 83 kg  08/28/12 83 kg     Intake/Output Summary (Last 24 hours) at 11/02/2020 0825 Last data filed at 11/01/2020 1905 Gross per 24 hour  Intake 1075 ml  Output 1400 ml  Net -325 ml     Physical Exam  Awake Alert, No new F.N deficits,   [place].AT,PERRAL Supple Neck,No JVD, No cervical lymphadenopathy appriciated.  Symmetrical Chest wall movement, Good air movement bilaterally, CTAB RRR,No Gallops, Rubs or new Murmurs, No Parasternal Heave +ve B.Sounds, Abd  Soft, No tenderness,   No rebound - guarding or rigidity. No Cyanosis, Clubbing or edema, No new Rash or bruise     Data Review:    CBC Recent Labs  Lab 10/28/20 2013 10/30/20 1925 10/31/20 1745 11/01/20 0207 11/02/20 0350  WBC 9.1 9.1 8.3 6.0 7.8  HGB 12.2 13.5 14.3 12.9 13.5  HCT 36.4 40.7 43.0 41.0 41.1  PLT 339 403* 398 PLATELET CLUMPS NOTED ON SMEAR, UNABLE TO ESTIMATE 376  MCV 91.0 91.1 91.3 96.7 92.8  MCH 30.5 30.2 30.4 30.4 30.5  MCHC 33.5 33.2 33.3 31.5 32.8  RDW 12.9 13.0 13.2 13.7 13.5  LYMPHSABS 3.8 3.2 4.4* 3.4 3.6  MONOABS 0.6 0.6 0.5 0.5 0.5  EOSABS 0.1 0.0 0.1 0.1 0.1  BASOSABS 0.1 0.1 0.1 0.1 0.1    Recent Labs  Lab 10/28/20 2013 10/30/20 1925 10/30/20 2308 10/31/20 0500 10/31/20 0725 11/01/20 0207 11/01/20 0405 11/01/20 0934 11/02/20 0350  NA 135 140  --  139  --  139  --   --  141  K 3.5 3.5  --  3.8  --  4.0  --   --  3.6  CL 100 106  --  105  --  108  --   --  109  CO2 27 24  --  24  --  24  --    --  26  GLUCOSE 152* 95  --  119*  --  131*  --   --  164*  BUN 14 7  --  6  --  <5*  --   --  6  CREATININE 0.77 0.81  --  0.65  --  0.63  --   --  0.75  CALCIUM 9.6 9.9  --  9.3  --  9.2  --   --  9.0  AST 59* 67*  --  63*  --  55*  --   --  46*  ALT 58* 56*  --  52*  --  45*  --   --  46*  ALKPHOS 68 70  --  65  --  58  --   --  60  BILITOT 0.7 0.9  --  1.0  --  1.5*  --   --  0.8  ALBUMIN 4.5 4.2  --  3.9  --  3.3*  --   --  3.1*  MG  --  2.1  --  2.2  --  2.1  --   --  2.1  PROCALCITON  --   --   --   --   --   --   --  <0.10 <0.10  INR 0.9  --   --   --  1.0  --   --  0.9  --   TSH  --   --   --  1.732  --   --   --   --   --   HGBA1C  --   --  9.0*  --   --   --   --   --   --   AMMONIA 33  --   --   --   --   --   --   --   --   BNP  --   --   --   --   --   --  15.0  --  8.6    ------------------------------------------------------------------------------------------------------------------ No results for input(s): CHOL, HDL, LDLCALC, TRIG, CHOLHDL, LDLDIRECT in the  last 72 hours.  Lab Results  Component Value Date   HGBA1C 9.0 (H) 10/30/2020   ------------------------------------------------------------------------------------------------------------------ Recent Labs    10/31/20 0500  TSH 1.732    Cardiac Enzymes No results for input(s): CKMB, TROPONINI, MYOGLOBIN in the last 168 hours.  Invalid input(s): CK ------------------------------------------------------------------------------------------------------------------    Component Value Date/Time   BNP 8.6 11/02/2020 0350    Micro Results Recent Results (from the past 240 hour(s))  Resp Panel by RT-PCR (Flu A&B, Covid) Nasopharyngeal Swab     Status: None   Collection Time: 10/28/20  9:24 PM   Specimen: Nasopharyngeal Swab; Nasopharyngeal(NP) swabs in vial transport medium  Result Value Ref Range Status   SARS Coronavirus 2 by RT PCR NEGATIVE NEGATIVE Final    Comment: (NOTE) SARS-CoV-2 target nucleic  acids are NOT DETECTED.  The SARS-CoV-2 RNA is generally detectable in upper respiratory specimens during the acute phase of infection. The lowest concentration of SARS-CoV-2 viral copies this assay can detect is 138 copies/mL. A negative result does not preclude SARS-Cov-2 infection and should not be used as the sole basis for treatment or other patient management decisions. A negative result may occur with  improper specimen collection/handling, submission of specimen other than nasopharyngeal swab, presence of viral mutation(s) within the areas targeted by this assay, and inadequate number of viral copies(<138 copies/mL). A negative result must be combined with clinical observations, patient history, and epidemiological information. The expected result is Negative.  Fact Sheet for Patients:  BloggerCourse.com  Fact Sheet for Healthcare Providers:  SeriousBroker.it  This test is no t yet approved or cleared by the [place] FDA and  has been authorized for detection and/or diagnosis of SARS-CoV-2 by FDA under an Emergency Use Authorization (EUA). This EUA will remain  in effect (meaning this test can be used) for the duration of the COVID-19 declaration under Section 564(b)(1) of the Act, 21 U.S.C.section 360bbb-3(b)(1), unless the authorization is terminated  or revoked sooner.       Influenza A by PCR NEGATIVE NEGATIVE Final   Influenza B by PCR NEGATIVE NEGATIVE Final    Comment: (NOTE) The Xpert Xpress SARS-CoV-2/FLU/RSV plus assay is intended as an aid in the diagnosis of influenza from Nasopharyngeal swab specimens and should not be used as a sole basis for treatment. Nasal washings and aspirates are unacceptable for Xpert Xpress SARS-CoV-2/FLU/RSV testing.  Fact Sheet for Patients: BloggerCourse.com  Fact Sheet for Healthcare Providers: SeriousBroker.it  This  test is not yet approved or cleared by the [place] FDA and has been authorized for detection and/or diagnosis of SARS-CoV-2 by FDA under an Emergency Use Authorization (EUA). This EUA will remain in effect (meaning this test can be used) for the duration of the COVID-19 declaration under Section 564(b)(1) of the Act, 21 U.S.C. section 360bbb-3(b)(1), unless the authorization is terminated or revoked.  Performed at [facility], [address], [place], [place] [address]   Resp Panel by RT-PCR (Flu A&B, Covid) Nasopharyngeal Swab     Status: None   Collection Time: 10/30/20 10:22 PM   Specimen: Nasopharyngeal Swab; Nasopharyngeal(NP) swabs in vial transport medium  Result Value Ref Range Status   SARS Coronavirus 2 by RT PCR NEGATIVE NEGATIVE Final    Comment: (NOTE) SARS-CoV-2 target nucleic acids are NOT DETECTED.  The SARS-CoV-2 RNA is generally detectable in upper respiratory specimens during the acute phase of infection. The lowest concentration of SARS-CoV-2 viral copies this assay can detect is 138 copies/mL. A negative result does  not preclude SARS-Cov-2 infection and should not be used as the sole basis for treatment or other patient management decisions. A negative result may occur with  improper specimen collection/handling, submission of specimen other than nasopharyngeal swab, presence of viral mutation(s) within the areas targeted by this assay, and inadequate number of viral copies(<138 copies/mL). A negative result must be combined with clinical observations, patient history, and epidemiological information. The expected result is Negative.  Fact Sheet for Patients:  BloggerCourse.comhttps://www.fda.gov/media/152166/download  Fact Sheet for Healthcare Providers:  SeriousBroker.ithttps://www.fda.gov/media/152162/download  This test is no t yet approved or cleared by the [place] FDA and  has been authorized for detection and/or diagnosis of SARS-CoV-2 by FDA under  an Emergency Use Authorization (EUA). This EUA will remain  in effect (meaning this test can be used) for the duration of the COVID-19 declaration under Section 564(b)(1) of the Act, 21 U.S.C.section 360bbb-3(b)(1), unless the authorization is terminated  or revoked sooner.       Influenza A by PCR NEGATIVE NEGATIVE Final   Influenza B by PCR NEGATIVE NEGATIVE Final    Comment: (NOTE) The Xpert Xpress SARS-CoV-2/FLU/RSV plus assay is intended as an aid in the diagnosis of influenza from Nasopharyngeal swab specimens and should not be used as a sole basis for treatment. Nasal washings and aspirates are unacceptable for Xpert Xpress SARS-CoV-2/FLU/RSV testing.  Fact Sheet for Patients: BloggerCourse.comhttps://www.fda.gov/media/152166/download  Fact Sheet for Healthcare Providers: SeriousBroker.ithttps://www.fda.gov/media/152162/download  This test is not yet approved or cleared by the [place] FDA and has been authorized for detection and/or diagnosis of SARS-CoV-2 by FDA under an Emergency Use Authorization (EUA). This EUA will remain in effect (meaning this test can be used) for the duration of the COVID-19 declaration under Section 564(b)(1) of the Act, 21 U.S.C. section 360bbb-3(b)(1), unless the authorization is terminated or revoked.  Performed at [facility], [address] [place], [place] [address]     Radiology Reports CT ABDOMEN PELVIS WO CONTRAST  Result Date: 11/01/2020 CLINICAL DATA:  Abdominal pain. EXAM: CT ABDOMEN AND PELVIS WITHOUT CONTRAST TECHNIQUE: Multidetector CT imaging of the abdomen and pelvis was performed following the standard protocol without IV contrast. COMPARISON:  CT scan from 2007. FINDINGS: Lower chest: The lung bases clear of an acute process. No pulmonary lesions or pleural effusions. No pericardial effusion. There are 3 adjacent nodular lesions in the lower left breast. These could be intramammary lymph nodes or possible benign fibroadenomas. Recommend  correlation with physical examination and diagnostic mammography and ultrasound. Hepatobiliary: No hepatic lesions or intrahepatic biliary dilatation. The gallbladder is unremarkable. No common bile duct dilatation. Pancreas: No mass, inflammation or ductal dilatation. Spleen: Normal size.  No focal lesions. Adrenals/Urinary Tract: Adrenal glands and kidneys are unremarkable. No worrisome renal lesions, renal calculi or hydroureteronephrosis. The bladder is unremarkable. Stomach/Bowel: The stomach, duodenum, small bowel and colon are unremarkable. No acute inflammatory changes, mass lesions or obstructive findings. High transverse cecum is noted. The terminal ileum is normal. The appendix is normal. Moderate stool throughout the colon and down into the rectum could suggest constipation. Scattered sigmoid colon and descending colon diverticulosis but no findings for acute diverticulitis. Vascular/Lymphatic: Insert aorta noncontrast Reproductive: The uterus and ovaries are unremarkable. Other: No pelvic mass or adenopathy. No free pelvic fluid collections. No inguinal mass or adenopathy. Moderate-sized periumbilical abdominal wall hernia containing fat. Musculoskeletal: No significant bony findings. IMPRESSION: 1. No acute abdominal/pelvic findings, mass lesions or adenopathy. 2. Moderate stool throughout the colon and down into the rectum could  suggest constipation. 3. 3 adjacent nodular lesions in the lower left breast. These could be intramammary lymph nodes or possible benign fibroadenomas. Recommend correlation with physical examination and diagnostic mammography and ultrasound. 4. Moderate-sized periumbilical abdominal wall hernia containing fat. Electronically Signed   By: Rudie Meyer M.D.   On: 11/01/2020 13:29   DG Chest 1 View  Result Date: 10/31/2020 CLINICAL DATA:  Metal implant screen prior to MRI. EXAM: CHEST  1 VIEW COMPARISON:  None. FINDINGS: The heart size and mediastinal contours are within  normal limits. Both lungs are clear. The visualized skeletal structures are unremarkable. IMPRESSION: No active disease. Electronically Signed   By: Maudry Mayhew MD   On: 10/31/2020 02:43   DG Abd 1 View  Result Date: 10/31/2020 CLINICAL DATA:  Metal implant screen prior to MRI. EXAM: ABDOMEN - 1 VIEW COMPARISON:  Abdominal ultrasound March 10, 2009. FINDINGS: The bowel gas pattern is normal. No radio-opaque calculi or other significant radiographic abnormality are seen. IMPRESSION: Negative. Electronically Signed   By: Maudry Mayhew MD   On: 10/31/2020 02:42   CT HEAD WO CONTRAST  Result Date: 10/30/2020 CLINICAL DATA:  Seizure EXAM: CT HEAD WITHOUT CONTRAST TECHNIQUE: Contiguous axial images were obtained from the base of the skull through the vertex without intravenous contrast. COMPARISON:  Oct 28, 2020. FINDINGS: Brain: Ventricles and sulci are normal in size and configuration. There is no intracranial mass, hemorrhage, extra-axial fluid collection, or midline shift. Brain parenchyma appears unremarkable. No acute infarct is appreciable. Vascular: No hyperdense vessel.  No evident vascular calcification. Skull: Bony calvarium appears intact. Sinuses/Orbits: There is a retention cyst in the inferior right maxillary antrum. Other paranasal sinuses are clear. Orbits appear symmetric bilaterally. Other: Mastoid air cells are clear. IMPRESSION: Normal appearing brain parenchyma. No mass or hemorrhage. No evident acute infarct. Retention cyst inferior right maxillary antrum. Electronically Signed   By: Bretta Bang III M.D.   On: 10/30/2020 21:56   CT Head Wo Contrast  Result Date: 10/28/2020 CLINICAL DATA:  Mental status changes of unknown cause. EXAM: CT HEAD WITHOUT CONTRAST TECHNIQUE: Contiguous axial images were obtained from the base of the skull through the vertex without intravenous contrast. COMPARISON:  03/29/2007 FINDINGS: Brain: The brain has normal appearance without evidence of  atrophy, old or acute infarction, mass lesion, hemorrhage, hydrocephalus or extra-axial collection. Vascular: Evident no abnormal vascular finding. Skull: Normal Sinuses/Orbits: Clear/normal Other: None IMPRESSION: Normal examination. No abnormality seen to explain mental status changes. Electronically Signed   By: Paulina Fusi M.D.   On: 10/28/2020 20:30   MR BRAIN W WO CONTRAST  Result Date: 10/31/2020 CLINICAL DATA:  Seizure with abnormal neuro exam EXAM: MRI HEAD WITHOUT AND WITH CONTRAST TECHNIQUE: Multiplanar, multiecho pulse sequences of the brain and surrounding structures were obtained without and with intravenous contrast. CONTRAST:  7.68mL GADAVIST GADOBUTROL 1 MMOL/ML IV SOLN COMPARISON:  10/30/2020 FINDINGS: Brain: No acute infarction, hemorrhage, hydrocephalus, extra-axial collection or mass lesion. No cortical finding to explain seizure. No white matter disease, atrophy, or abnormal enhancement. Small remote left cerebellar infarct. Vascular: Normal flow voids and vascular enhancements Skull and upper cervical spine: Normal marrow signal Sinuses/Orbits: Negative Other: Motion degradation which is intermittent and at times creates nondiagnostic slices. IMPRESSION: 1. Motion degraded brain MRI without acute finding. 2. No visible correlate for seizure. 3. Small remote left cerebellar infarct Electronically Signed   By: Marnee Spring M.D.   On: 10/31/2020 04:15   DG Chest Portable 1 View  Result Date:  10/28/2020 CLINICAL DATA:  Altered mental status. EXAM: PORTABLE CHEST 1 VIEW COMPARISON:  Remote radiograph 03/25/2009 FINDINGS: Lung volumes are low. Mild bibasilar atelectasis. Normal heart size for technique. No pulmonary edema, pleural effusion, or pneumothorax. No acute osseous abnormalities are seen. IMPRESSION: Low lung volumes with bibasilar atelectasis. Electronically Signed   By: Narda Rutherford M.D.   On: 10/28/2020 20:20   DG Abd Portable 1V  Result Date: 10/31/2020 CLINICAL DATA:   Generalized abdominal pain.  Status post seizure. EXAM: PORTABLE ABDOMEN - 1 VIEW COMPARISON:  10/31/2020 FINDINGS: The bowel gas pattern is nonobstructed. No dilated loops of large or small bowel. Moderate stool burden identified within the right colon. IMPRESSION: 1. Nonobstructive bowel gas pattern. Electronically Signed   By: Signa Kell M.D.   On: 10/31/2020 11:35   Overnight EEG with video  Result Date: 11/01/2020 Charlsie Quest, MD     11/01/2020  9:57 AM Patient Name: ANDILYNN DELAVEGA MRN: [id number] Epilepsy Attending: Charlsie Quest Referring Physician/Provider: Dr. Onalee Hua Duration: 10/31/2020 1611 to 15/16/2022 0945 Patient history: 55 year old female with mild mental retardation who presents with new onset seizures.  EEG to evaluate for seizures. Level of alertness: Awake, asleep AEDs during EEG study: Keppra Technical aspects: This EEG study was done with scalp electrodes positioned according to the 10-20 International system of electrode placement. Electrical activity was acquired at a sampling rate of 500Hz  and reviewed with a high frequency filter of 70Hz  and a low frequency filter of 1Hz . EEG data were recorded continuously and digitally stored. Description: The posterior dominant rhythm consists of 9-10 Hz activity of moderate voltage (25-35 uV) seen predominantly in posterior head regions, symmetric and reactive to eye opening and eye closing. Sleep was characterized by vertex waves, sleep spindles (12 to 14 Hz), maximal frontocentral region. Hyperventilation and photic stimulation were not performed.   IMPRESSION: This study is within normal limits. No seizures or epileptiform discharges were seen throughout the recording. Priyanka 

## 2020-11-02 NOTE — Plan of Care (Signed)

## 2020-11-02 NOTE — Plan of Care (Signed)
  Problem: Coping: Goal: Ability to adjust to condition or change in health will improve Outcome: Progressing   Problem: Health Behavior/Discharge Planning: Goal: Compliance with prescribed medication regimen will improve Outcome: Progressing   Problem: Clinical Measurements: Goal: Complications related to the disease process, condition or treatment will be avoided or minimized Outcome: Progressing

## 2020-11-03 DIAGNOSIS — E118 Type 2 diabetes mellitus with unspecified complications: Secondary | ICD-10-CM | POA: Diagnosis not present

## 2020-11-03 LAB — MAGNESIUM: Magnesium: 2.4 mg/dL (ref 1.7–2.4)

## 2020-11-03 LAB — CBC WITH DIFFERENTIAL/PLATELET
Abs Immature Granulocytes: 0.03 10*3/uL (ref 0.00–0.07)
Basophils Absolute: 0 10*3/uL (ref 0.0–0.1)
Basophils Relative: 1 %
Eosinophils Absolute: 0.1 10*3/uL (ref 0.0–0.5)
Eosinophils Relative: 1 %
HCT: 36 % (ref 36.0–46.0)
Hemoglobin: 11.6 g/dL — ABNORMAL LOW (ref 12.0–15.0)
Immature Granulocytes: 0 %
Lymphocytes Relative: 35 %
Lymphs Abs: 3.1 10*3/uL (ref 0.7–4.0)
MCH: 29.9 pg (ref 26.0–34.0)
MCHC: 32.2 g/dL (ref 30.0–36.0)
MCV: 92.8 fL (ref 80.0–100.0)
Monocytes Absolute: 0.7 10*3/uL (ref 0.1–1.0)
Monocytes Relative: 8 %
Neutro Abs: 4.9 10*3/uL (ref 1.7–7.7)
Neutrophils Relative %: 55 %
Platelets: 363 10*3/uL (ref 150–400)
RBC: 3.88 MIL/uL (ref 3.87–5.11)
RDW: 13.3 % (ref 11.5–15.5)
WBC: 8.7 10*3/uL (ref 4.0–10.5)
nRBC: 0 % (ref 0.0–0.2)

## 2020-11-03 LAB — COMPREHENSIVE METABOLIC PANEL
ALT: 40 U/L (ref 0–44)
AST: 39 U/L (ref 15–41)
Albumin: 3.1 g/dL — ABNORMAL LOW (ref 3.5–5.0)
Alkaline Phosphatase: 60 U/L (ref 38–126)
Anion gap: 5 (ref 5–15)
BUN: 10 mg/dL (ref 6–20)
CO2: 28 mmol/L (ref 22–32)
Calcium: 9.1 mg/dL (ref 8.9–10.3)
Chloride: 105 mmol/L (ref 98–111)
Creatinine, Ser: 0.69 mg/dL (ref 0.44–1.00)
GFR, Estimated: >60 mL/min (ref >60)
Glucose, Bld: 222 mg/dL — ABNORMAL HIGH (ref 70–99)
Potassium: 3.9 mmol/L (ref 3.5–5.1)
Sodium: 138 mmol/L (ref 135–145)
Total Bilirubin: 0.3 mg/dL (ref 0.3–1.2)
Total Protein: 6.3 g/dL — ABNORMAL LOW (ref 6.5–8.1)

## 2020-11-03 LAB — BRAIN NATRIURETIC PEPTIDE: B Natriuretic Peptide: 6 pg/mL (ref 0.0–100.0)

## 2020-11-03 LAB — PROCALCITONIN: Procalcitonin: <0.1 ng/mL

## 2020-11-03 LAB — GLUCOSE, CAPILLARY
Glucose-Capillary: 192 mg/dL — ABNORMAL HIGH (ref 70–99)
Glucose-Capillary: 210 mg/dL — ABNORMAL HIGH (ref 70–99)
Glucose-Capillary: 276 mg/dL — ABNORMAL HIGH (ref 70–99)
Glucose-Capillary: 356 mg/dL — ABNORMAL HIGH (ref 70–99)
Glucose-Capillary: 362 mg/dL — ABNORMAL HIGH (ref 70–99)

## 2020-11-03 MED ORDER — RISPERIDONE 2 MG PO TABS: 2.0000 mg | ORAL_TABLET | Freq: Every day | ORAL | 0 refills | Status: AC

## 2020-11-03 MED ORDER — LEVETIRACETAM 1000 MG PO TABS: 1000.0000 mg | ORAL_TABLET | Freq: Two times a day (BID) | ORAL | 0 refills | Status: DC

## 2020-11-03 MED ORDER — SIMVASTATIN 20 MG PO TABS: 20.0000 mg | ORAL_TABLET | Freq: Every day | ORAL | 0 refills | Status: DC

## 2020-11-03 MED ORDER — TRAZODONE HCL 100 MG PO TABS: 100.0000 mg | ORAL_TABLET | Freq: Every evening | ORAL | 0 refills | Status: DC | PRN

## 2020-11-03 NOTE — TOC Initial Note (Signed)
Transition of Care [facility]) - Initial/Assessment Note    Patient Details  Name: [name] MRN: [id number] Date of Birth: 1965-11-28  Transition of Care [facility]) CM/SW Contact:    Lorri Frederick, LCSW Phone Number: 11/03/2020, 11:02 AM  Clinical Narrative:   CSW spoke by phone with pt sister [name].  Pt lives with her and is always supervised.  Pt regular routine is to go daily to an IDD day program, [facility] from AM until mid afternoon.  She rides the [employer] bus (supervised transportation) to and from that program.  Pt current psychiatrist is [name] at [facility].  Per [name], she was at the hospital last night and pt continues to be "zoned out", not talking and not at all at her baseline.  Pt has not historically needed any DME to get around.  She is vaccinated for covid but sister does not think she has the booster shot. Sister is open to outpt PT and could transport her there if that is needed.                  Expected Discharge Plan: Home/Self Care Barriers to Discharge: Continued Medical Work up   Patient Goals and CMS Choice   CMS Medicare.gov Compare Post Acute Care list provided to::  (na)    Expected Discharge Plan and Services Expected Discharge Plan: Home/Self Care In-house Referral: Clinical Social Work   Post Acute Care Choice:  (outpt PT) Living arrangements for the past 2 months: Single Family Home                                      Prior Living Arrangements/Services Living arrangements for the past 2 months: Single Family Home Lives with:: Siblings Patient language and need for interpreter reviewed:: Yes        Need for Family Participation in Patient Care: Yes (Comment) Care giver support system in place?: Yes (comment) Current home services: Other (comment) (none) Criminal Activity/Legal Involvement Pertinent to Current Situation/Hospitalization: No - Comment as needed  Activities of Daily Living Home Assistive  Devices/Equipment: None ADL Screening (condition at time of admission) Patient's cognitive ability adequate to safely complete daily activities?: No Is the patient deaf or have difficulty hearing?: No Does the patient have difficulty seeing, even when wearing glasses/contacts?: Yes Does the patient have difficulty concentrating, remembering, or making decisions?: Yes Patient able to express need for assistance with ADLs?: Yes Does the patient have difficulty dressing or bathing?: Yes Independently performs ADLs?: No Does the patient have difficulty walking or climbing stairs?: Yes Weakness of Legs: Both Weakness of Arms/Hands: None  Permission Sought/Granted Permission sought to share information with : Family Supports Permission granted to share information with : Yes, Verbal Permission Granted  Share Information with NAME: sister [place]           Emotional Assessment Appearance:: Appears stated age Attitude/Demeanor/Rapport: Unable to Assess Affect (typically observed): Unable to Assess Orientation: : Oriented to Self,Oriented to Place,Oriented to  Time Alcohol / Substance Use: Not Applicable Psych Involvement: No (comment)  Admission diagnosis:  Seizures (HCC) [R56.9] Acute encephalopathy [G93.40] MRI contraindicated due to metal implant [Z53.09] Abdominal pain [R10.9] Patient Active Problem List   Diagnosis Date Noted  . Observed seizure-like activity (HCC) 10/31/2020  . Seizure (HCC) 10/31/2020  . DM (diabetes mellitus), type 2 with complications (HCC) 10/31/2020  . Hypoglycemia 10/31/2020  . Elevated LFTs 10/31/2020  .  Acute encephalopathy 10/30/2020  . Change in mental status 10/29/2020  . Intellectual disability   . Confusion    PCP:  Mirna Mires, MD Pharmacy:   [facility] [place], [place] - [address] [place] [address] Phone: [phone] Fax: [phone]  [facility] [place], [place] - [address] [facility] [address] [place] [address] Phone: [phone] Fax: [phone]     Social Determinants of Health (SDOH) Interventions    Readmission Risk Interventions No flowsheet data found.

## 2020-11-03 NOTE — TOC Transition Note (Signed)
Transition of Care [facility]) - CM/SW Discharge Note   Patient Details  Name: [name] MRN: [id number] Date of Birth: [birth year]  Transition of Care [facility]) CM/SW Contact:  Lorri Frederick, LCSW Phone Number: 11/03/2020, 4:15 PM   Clinical Narrative:   CSW spoke with sister [name] who had additional questions, referred her to MD and informed MD that she would like to speak to him.  Outpt PT set up at [facility] location.  Sister will transport pt home.     Final next level of care: Home/Self Care Barriers to Discharge: Barriers Resolved   Patient Goals and CMS Choice   CMS Medicare.gov Compare Post Acute Care list provided to::  (na)    Discharge Placement                       Discharge Plan and Services In-house Referral: Clinical Social Work   Post Acute Care Choice:  (outpt PT)          DME Arranged: N/A         HH Arranged: NA          Social Determinants of Health (SDOH) Interventions     Readmission Risk Interventions No flowsheet data found.

## 2020-11-03 NOTE — Progress Notes (Signed)
  Speech Language Pathology Treatment: Dysphagia  Patient Details Name: [name] MRN: [id number] DOB: 04-14-1966 Today's Date: 11/03/2020 Time: 1597-3312 SLP Time Calculation (min) (ACUTE ONLY): 15.87 min  Assessment / Plan / Recommendation Clinical Impression  Pt was seen for dysphagia treatment and was cooperative throughout the session. Pt and her friends at bedside reported that the pt has been tolerating the current diet without overt s/sx of aspiration. Pt tolerated puree solids, regular texture solids, and up to ~5oz of thin liquids via straw using consecutive swallows without symptoms of oropharyngeal dysphagia. Pt's diet has been advanced to soft solids by MD and, based on today's presentation, it may be advanced further to regular if medically indicated. Further skilled SLP services are not clinically indicated at this time for swallowing.    HPI HPI: Pt is a 55 y.o. female who presented with possible seizure and increased confusion 2 wks prior to admission. PMH: intellectual disability, DM 2, HTN bipolar disorder. Dx Metabolic Encephalopathy.  CT head and CXR non-acute findings. CT abdomen: Moderate stool throughout the colon and down into the rectum  could suggest constipation.      SLP Plan  All goals met;Discharge SLP treatment due to (comment)       Recommendations  Diet recommendations: Regular;Thin liquid Liquids provided via: Cup;Straw Medication Administration: Whole meds with liquid Supervision: Patient able to self feed Postural Changes and/or Swallow Maneuvers: Seated upright 90 degrees                Oral Care Recommendations: Oral care BID Follow up Recommendations: None SLP Visit Diagnosis: Dysphagia, unspecified (R13.10) Plan: All goals met;Discharge SLP treatment due to (comment)       Gladstone Rosas I. Hardin Negus, [place], [place] Office number [phone] Pager [phone]                Horton Marshall 11/03/2020, 12:10 PM

## 2020-11-03 NOTE — Progress Notes (Signed)
Inpatient Diabetes Program Recommendations  AACE/ADA: New Consensus Statement on Inpatient Glycemic Control (2015)  Target Ranges:  Prepandial:   less than 140 mg/dL      Peak postprandial:   less than 180 mg/dL (1-2 hours)      Critically ill patients:  140 - 180 mg/dL   Lab Results  Component Value Date   GLUCAP 192 (H) 11/03/2020   HGBA1C 9.0 (H) 10/30/2020    Review of Glycemic Control Results for [name], [name] (MRN [id number]) as of 11/03/2020 09:55  Ref. Range 11/02/2020 08:23 11/02/2020 12:04 11/02/2020 15:47 11/02/2020 20:20 11/03/2020 00:07 11/03/2020 05:17 11/03/2020 07:55  Glucose-Capillary Latest Ref Range: 70 - 99 mg/dL 657 (H) 846 (H) 962 (H) 324 (H) 276 (H) 210 (H) 192 (H)   History: DM, Bipolar d/o, Intellectual Disability  Home DM Meds: Amaryl 2 mg Daily                             Novolog 1-7 units TID per SSI                             Lantus 40 units QHS                             Metformin 500 mg BID Current orders for Inpatient glycemic control:  Novolog 0-9 units Q4 hours  Inpatient Diabetes Program Recommendations:    -  Add Novolog 3 units tid meal coverage if eating > 50% of meals  Thanks,  Christena Deem RN, MSN, BC-ADM Inpatient Diabetes Coordinator Team Pager [phone] (8a-5p)

## 2020-11-03 NOTE — Discharge Summary (Signed)
Physician Discharge Summary  [name] ZOX:[id number] DOB: 24-May-1966 DOA: 10/30/2020  PCP: Mirna Mires, MD  Admit date: 10/30/2020 Discharge date: 11/03/2020  Admitted From: Home Disposition:  Home  Recommendations for Outpatient Follow-up:  1. Follow up with PCP in 1-2 weeks 2. Follow up with behavioral as scheduled 3. Follow up with Neurology as scheduled   Discharge Condition:Stable CODE STATUS:Full Diet recommendation: Diabetic   Brief/Interim Summary: 55 y.o.femalewith medical history significant of intellectual disability, DM 2, HTNbipolar disorder, presented withpossible seizure, increased confusion over past 2 wks, seen by neuro here so far appears to have had seizure at home.  EEG thus far unrevealing  Discharge Diagnoses:  Active Problems:   Intellectual disability   Acute encephalopathy   Observed seizure-like activity (HCC)   Seizure (HCC)   DM (diabetes mellitus), type 2 with complications (HCC)   Hypoglycemia   Elevated LFTs  1. Metabolic Encephalopathy likely secondary to seizures - Pt is now on Keppra, Neuro had been following, now signed off, CT and MRI brain nonacute, EEG noted to be nonacute, appears to be stable for now and close to her baseline, no headache or new focal deficits. There was question of mental status change. Have discussed case with Neurology and reviewed with Dr. Wilford Corner. Per Neurology, recommendation remains same, no change in current regimen. Pt is to f/u with Neurology as outpatient, referral requested  2.  Intellectual disability.  Continue with supportive care.  3.  Bipolar disorder.  For now continued on risperidone nightly. Reviewed outpatient regimen with pt's sister over phone. Pt's multiple psychiatric regimen was apparently stopped "cold [place]" one year ago, including depakote and zoloft. Zoloft remains on hold as it has been nearly one year since taking, however, pt will be discharged with risperidone as she is tolerating  in hospital. Pt already has an appointment with her therapist, Brock Bad, NP, next week. Will kindly ask pt's therapist to review pt's psychiatric regimen and adjust accordingly  4.  Dyslipidemia.  Placed on home dose statin.    5.  Generalized abdominal pain.  Xray and exam benign except for large stool burden, she does not localize the pain at this time, placed on bowel regimen with 1 bowel movement and improvement in pain, CT scan abdomen pelvis nonacute  6. DM type II.  on sliding scale as p.o. intake is inconsistent, does take moderate dose Lantus.   Discharge Instructions  Discharge Instructions    Ambulatory referral to Physical Therapy   Complete by: As directed      Allergies as of 11/03/2020      Reactions   Penicillins    Sulfa Antibiotics    Tomato       Medication List    STOP taking these medications   benztropine 0.5 MG tablet Commonly known as: COGENTIN   divalproex 500 MG 24 hr tablet Commonly known as: DEPAKOTE ER   HYDROcodone-acetaminophen 5-325 MG tablet Commonly known as: NORCO/VICODIN   ibuprofen 800 MG tablet Commonly known as: ADVIL   sertraline 100 MG tablet Commonly known as: ZOLOFT     TAKE these medications   furosemide 20 MG tablet Commonly known as: LASIX Take 20 mg by mouth daily.   glimepiride 2 MG tablet Commonly known as: AMARYL Take 2 mg by mouth daily before breakfast.   insulin aspart 100 UNIT/ML injection Commonly known as: novoLOG Inject 1-7 Units into the skin 3 (three) times daily as needed for high blood sugar (Per sliding scale: 120-150=1 unit, 150-200=2 units,  201-250=4 units, 251-300=6 units).   insulin glargine 100 unit/mL Sopn Commonly known as: LANTUS Inject 40 Units into the skin at bedtime.   levETIRAcetam 1000 MG tablet Commonly known as: KEPPRA Take 1 tablet (1,000 mg total) by mouth 2 (two) times daily.   loratadine 10 MG tablet Commonly known as: CLARITIN Take 10 mg by mouth daily.    metFORMIN 500 MG tablet Commonly known as: GLUCOPHAGE Take 500 mg by mouth 2 (two) times daily with a meal.   potassium chloride 10 MEQ tablet Commonly known as: KLOR-CON Take 10 mEq by mouth daily.   risperiDONE 2 MG tablet Commonly known as: RISPERDAL Take 1 tablet (2 mg total) by mouth at bedtime.   simvastatin 20 MG tablet Commonly known as: ZOCOR Take 1 tablet (20 mg total) by mouth at bedtime.   traZODone 100 MG tablet Commonly known as: DESYREL Take 1 tablet (100 mg total) by mouth at bedtime as needed for sleep. What changed: how much to take   valACYclovir 500 MG tablet Commonly known as: VALTREX Take 500 mg by mouth at bedtime.       Follow-up Information    Mirna Mires, MD. Schedule an appointment as soon as possible for a visit in 1 week(s).   Specialty: Family Medicine Why: get a breast exam and mammogram done, also follow with your OB physician in 1 week, review your CT report. Contact information: [address] [place] [address] [phone]        [facility] Follow up.   Specialty: Rehabilitation Why: The clinic will call you to schedule your first outpatient physical therapy appointment.  Contact information: [address] [phone] mc [address] [place] [address] [phone]       Brock Bad, NP Follow up.   Specialty: Nurse Practitioner Why: Follow up as scheduled Contact information: [address] [place] [address] [phone]              Allergies  Allergen Reactions  . Penicillins   . Sulfa Antibiotics   . Tomato     Consultations:  Neurology  Procedures/Studies: CT ABDOMEN PELVIS WO CONTRAST  Result Date: 11/01/2020 CLINICAL DATA:  Abdominal pain. EXAM: CT ABDOMEN AND PELVIS WITHOUT CONTRAST TECHNIQUE: Multidetector CT imaging of the abdomen and pelvis was performed following the standard protocol without IV contrast. COMPARISON:  CT scan from  2007. FINDINGS: Lower chest: The lung bases clear of an acute process. No pulmonary lesions or pleural effusions. No pericardial effusion. There are 3 adjacent nodular lesions in the lower left breast. These could be intramammary lymph nodes or possible benign fibroadenomas. Recommend correlation with physical examination and diagnostic mammography and ultrasound. Hepatobiliary: No hepatic lesions or intrahepatic biliary dilatation. The gallbladder is unremarkable. No common bile duct dilatation. Pancreas: No mass, inflammation or ductal dilatation. Spleen: Normal size.  No focal lesions. Adrenals/Urinary Tract: Adrenal glands and kidneys are unremarkable. No worrisome renal lesions, renal calculi or hydroureteronephrosis. The bladder is unremarkable. Stomach/Bowel: The stomach, duodenum, small bowel and colon are unremarkable. No acute inflammatory changes, mass lesions or obstructive findings. High transverse cecum is noted. The terminal ileum is normal. The appendix is normal. Moderate stool throughout the colon and down into the rectum could suggest constipation. Scattered sigmoid colon and descending colon diverticulosis but no findings for acute diverticulitis. Vascular/Lymphatic: Insert aorta noncontrast Reproductive: The uterus and ovaries are unremarkable. Other: No pelvic mass or adenopathy. No free pelvic fluid collections. No inguinal mass or adenopathy.  Moderate-sized periumbilical abdominal wall hernia containing fat. Musculoskeletal: No significant bony findings. IMPRESSION: 1. No acute abdominal/pelvic findings, mass lesions or adenopathy. 2. Moderate stool throughout the colon and down into the rectum could suggest constipation. 3. 3 adjacent nodular lesions in the lower left breast. These could be intramammary lymph nodes or possible benign fibroadenomas. Recommend correlation with physical examination and diagnostic mammography and ultrasound. 4. Moderate-sized periumbilical abdominal wall hernia  containing fat. Electronically Signed   By: Rudie Meyer M.D.   On: 11/01/2020 13:29   DG Chest 1 View  Result Date: 10/31/2020 CLINICAL DATA:  Metal implant screen prior to MRI. EXAM: CHEST  1 VIEW COMPARISON:  None. FINDINGS: The heart size and mediastinal contours are within normal limits. Both lungs are clear. The visualized skeletal structures are unremarkable. IMPRESSION: No active disease. Electronically Signed   By: Maudry Mayhew MD   On: 10/31/2020 02:43   DG Abd 1 View  Result Date: 10/31/2020 CLINICAL DATA:  Metal implant screen prior to MRI. EXAM: ABDOMEN - 1 VIEW COMPARISON:  Abdominal ultrasound March 10, 2009. FINDINGS: The bowel gas pattern is normal. No radio-opaque calculi or other significant radiographic abnormality are seen. IMPRESSION: Negative. Electronically Signed   By: Maudry Mayhew MD   On: 10/31/2020 02:42   CT HEAD WO CONTRAST  Result Date: 10/30/2020 CLINICAL DATA:  Seizure EXAM: CT HEAD WITHOUT CONTRAST TECHNIQUE: Contiguous axial images were obtained from the base of the skull through the vertex without intravenous contrast. COMPARISON:  Oct 28, 2020. FINDINGS: Brain: Ventricles and sulci are normal in size and configuration. There is no intracranial mass, hemorrhage, extra-axial fluid collection, or midline shift. Brain parenchyma appears unremarkable. No acute infarct is appreciable. Vascular: No hyperdense vessel.  No evident vascular calcification. Skull: Bony calvarium appears intact. Sinuses/Orbits: There is a retention cyst in the inferior right maxillary antrum. Other paranasal sinuses are clear. Orbits appear symmetric bilaterally. Other: Mastoid air cells are clear. IMPRESSION: Normal appearing brain parenchyma. No mass or hemorrhage. No evident acute infarct. Retention cyst inferior right maxillary antrum. Electronically Signed   By: Bretta Bang III M.D.   On: 10/30/2020 21:56   CT Head Wo Contrast  Result Date: 10/28/2020 CLINICAL DATA:  Mental  status changes of unknown cause. EXAM: CT HEAD WITHOUT CONTRAST TECHNIQUE: Contiguous axial images were obtained from the base of the skull through the vertex without intravenous contrast. COMPARISON:  03/29/2007 FINDINGS: Brain: The brain has normal appearance without evidence of atrophy, old or acute infarction, mass lesion, hemorrhage, hydrocephalus or extra-axial collection. Vascular: Evident no abnormal vascular finding. Skull: Normal Sinuses/Orbits: Clear/normal Other: None IMPRESSION: Normal examination. No abnormality seen to explain mental status changes. Electronically Signed   By: Paulina Fusi M.D.   On: 10/28/2020 20:30   MR BRAIN W WO CONTRAST  Result Date: 10/31/2020 CLINICAL DATA:  Seizure with abnormal neuro exam EXAM: MRI HEAD WITHOUT AND WITH CONTRAST TECHNIQUE: Multiplanar, multiecho pulse sequences of the brain and surrounding structures were obtained without and with intravenous contrast. CONTRAST:  7.71mL GADAVIST GADOBUTROL 1 MMOL/ML IV SOLN COMPARISON:  10/30/2020 FINDINGS: Brain: No acute infarction, hemorrhage, hydrocephalus, extra-axial collection or mass lesion. No cortical finding to explain seizure. No white matter disease, atrophy, or abnormal enhancement. Small remote left cerebellar infarct. Vascular: Normal flow voids and vascular enhancements Skull and upper cervical spine: Normal marrow signal Sinuses/Orbits: Negative Other: Motion degradation which is intermittent and at times creates nondiagnostic slices. IMPRESSION: 1. Motion degraded brain MRI without acute finding. 2. No  visible correlate for seizure. 3. Small remote left cerebellar infarct Electronically Signed   By: Marnee Spring M.D.   On: 10/31/2020 04:15   DG Chest Portable 1 View  Result Date: 10/28/2020 CLINICAL DATA:  Altered mental status. EXAM: PORTABLE CHEST 1 VIEW COMPARISON:  Remote radiograph 03/25/2009 FINDINGS: Lung volumes are low. Mild bibasilar atelectasis. Normal heart size for technique. No  pulmonary edema, pleural effusion, or pneumothorax. No acute osseous abnormalities are seen. IMPRESSION: Low lung volumes with bibasilar atelectasis. Electronically Signed   By: Narda Rutherford M.D.   On: 10/28/2020 20:20   DG Abd Portable 1V  Result Date: 10/31/2020 CLINICAL DATA:  Generalized abdominal pain.  Status post seizure. EXAM: PORTABLE ABDOMEN - 1 VIEW COMPARISON:  10/31/2020 FINDINGS: The bowel gas pattern is nonobstructed. No dilated loops of large or small bowel. Moderate stool burden identified within the right colon. IMPRESSION: 1. Nonobstructive bowel gas pattern. Electronically Signed   By: Signa Kell M.D.   On: 10/31/2020 11:35   Overnight EEG with video  Result Date: 11/01/2020 Charlsie Quest, MD     11/01/2020  9:57 AM Patient Name: YOSHIKA VENSEL MRN: [id number] Epilepsy Attending: Charlsie Quest Referring Physician/Provider: Dr. Onalee Hua Duration: 10/31/2020 1611 to 15/16/2022 0945 Patient history: 55 year old female with mild mental retardation who presents with new onset seizures.  EEG to evaluate for seizures. Level of alertness: Awake, asleep AEDs during EEG study: Keppra Technical aspects: This EEG study was done with scalp electrodes positioned according to the 10-20 International system of electrode placement. Electrical activity was acquired at a sampling rate of 500Hz  and reviewed with a high frequency filter of 70Hz  and a low frequency filter of 1Hz . EEG data were recorded continuously and digitally stored. Description: The posterior dominant rhythm consists of 9-10 Hz activity of moderate voltage (25-35 uV) seen predominantly in posterior head regions, symmetric and reactive to eye opening and eye closing. Sleep was characterized by vertex waves, sleep spindles (12 to 14 Hz), maximal frontocentral region. Hyperventilation and photic stimulation were not performed.   IMPRESSION: This study is within normal limits. No seizures or epileptiform discharges were  seen throughout the recording. Priyanka     Subjective: Eager to go home  Discharge Exam: Vitals:   11/03/20 0406 11/03/20 0719  BP: (!) 116/57 139/84  Pulse: 86 88  Resp: 13 18  Temp: 98.8 F (37.1 C) 98 F (36.7 C)  SpO2:  99%   Vitals:   11/02/20 1548 11/02/20 2002 11/03/20 0406 11/03/20 0719  BP: 120/69 (!) 133/91 (!) 116/57 139/84  Pulse: (!) 104 99 86 88  Resp: 13 (!) 22 13 18   Temp: 98.4 F (36.9 C) 98.9 F (37.2 C) 98.8 F (37.1 C) 98 F (36.7 C)  TempSrc: Oral Oral Oral Oral  SpO2: 97% 98%  99%  Weight:      Height:        General: Pt is alert, awake, not in acute distress Cardiovascular: RRR, S1/S2 +, no rubs, no gallops Respiratory: CTA bilaterally, no wheezing, no rhonchi Abdominal: Soft, NT, ND, bowel sounds + Extremities: no edema, no cyanosis   The results of significant diagnostics from this hospitalization (including imaging, microbiology, ancillary and laboratory) are listed below for reference.     Microbiology: Recent Results (from the past 240 hour(s))  Resp Panel by RT-PCR (Flu A&B, Covid) Nasopharyngeal Swab     Status: None   Collection Time: 10/28/20  9:24 PM   Specimen: Nasopharyngeal Swab; Nasopharyngeal(NP)  swabs in vial transport medium  Result Value Ref Range Status   SARS Coronavirus 2 by RT PCR NEGATIVE NEGATIVE Final    Comment: (NOTE) SARS-CoV-2 target nucleic acids are NOT DETECTED.  The SARS-CoV-2 RNA is generally detectable in upper respiratory specimens during the acute phase of infection. The lowest concentration of SARS-CoV-2 viral copies this assay can detect is 138 copies/mL. A negative result does not preclude SARS-Cov-2 infection and should not be used as the sole basis for treatment or other patient management decisions. A negative result may occur with  improper specimen collection/handling, submission of specimen other than nasopharyngeal swab, presence of viral mutation(s) within the areas targeted  by this assay, and inadequate number of viral copies(<138 copies/mL). A negative result must be combined with clinical observations, patient history, and epidemiological information. The expected result is Negative.  Fact Sheet for Patients:  BloggerCourse.comhttps://www.fda.gov/media/152166/download  Fact Sheet for Healthcare Providers:  SeriousBroker.ithttps://www.fda.gov/media/152162/download  This test is no t yet approved or cleared by the [place] FDA and  has been authorized for detection and/or diagnosis of SARS-CoV-2 by FDA under an Emergency Use Authorization (EUA). This EUA will remain  in effect (meaning this test can be used) for the duration of the COVID-19 declaration under Section 564(b)(1) of the Act, 21 U.S.C.section 360bbb-3(b)(1), unless the authorization is terminated  or revoked sooner.       Influenza A by PCR NEGATIVE NEGATIVE Final   Influenza B by PCR NEGATIVE NEGATIVE Final    Comment: (NOTE) The Xpert Xpress SARS-CoV-2/FLU/RSV plus assay is intended as an aid in the diagnosis of influenza from Nasopharyngeal swab specimens and should not be used as a sole basis for treatment. Nasal washings and aspirates are unacceptable for Xpert Xpress SARS-CoV-2/FLU/RSV testing.  Fact Sheet for Patients: BloggerCourse.comhttps://www.fda.gov/media/152166/download  Fact Sheet for Healthcare Providers: SeriousBroker.ithttps://www.fda.gov/media/152162/download  This test is not yet approved or cleared by the [place] FDA and has been authorized for detection and/or diagnosis of SARS-CoV-2 by FDA under an Emergency Use Authorization (EUA). This EUA will remain in effect (meaning this test can be used) for the duration of the COVID-19 declaration under Section 564(b)(1) of the Act, 21 U.S.C. section 360bbb-3(b)(1), unless the authorization is terminated or revoked.  Performed at [facility], [address] [place], [place] [address]   Resp Panel by RT-PCR (Flu A&B, Covid) Nasopharyngeal Swab      Status: None   Collection Time: 10/30/20 10:22 PM   Specimen: Nasopharyngeal Swab; Nasopharyngeal(NP) swabs in vial transport medium  Result Value Ref Range Status   SARS Coronavirus 2 by RT PCR NEGATIVE NEGATIVE Final    Comment: (NOTE) SARS-CoV-2 target nucleic acids are NOT DETECTED.  The SARS-CoV-2 RNA is generally detectable in upper respiratory specimens during the acute phase of infection. The lowest concentration of SARS-CoV-2 viral copies this assay can detect is 138 copies/mL. A negative result does not preclude SARS-Cov-2 infection and should not be used as the sole basis for treatment or other patient management decisions. A negative result may occur with  improper specimen collection/handling, submission of specimen other than nasopharyngeal swab, presence of viral mutation(s) within the areas targeted by this assay, and inadequate number of viral copies(<138 copies/mL). A negative result must be combined with clinical observations, patient history, and epidemiological information. The expected result is Negative.  Fact Sheet for Patients:  BloggerCourse.comhttps://www.fda.gov/media/152166/download  Fact Sheet for Healthcare Providers:  SeriousBroker.ithttps://www.fda.gov/media/152162/download  This test is no t yet approved or cleared by the [place] FDA and  has been authorized for detection and/or diagnosis of SARS-CoV-2 by FDA under an Emergency Use Authorization (EUA). This EUA will remain  in effect (meaning this test can be used) for the duration of the COVID-19 declaration under Section 564(b)(1) of the Act, 21 U.S.C.section 360bbb-3(b)(1), unless the authorization is terminated  or revoked sooner.       Influenza A by PCR NEGATIVE NEGATIVE Final   Influenza B by PCR NEGATIVE NEGATIVE Final    Comment: (NOTE) The Xpert Xpress SARS-CoV-2/FLU/RSV plus assay is intended as an aid in the diagnosis of influenza from Nasopharyngeal swab specimens and should not be used as a sole basis  for treatment. Nasal washings and aspirates are unacceptable for Xpert Xpress SARS-CoV-2/FLU/RSV testing.  Fact Sheet for Patients: BloggerCourse.com  Fact Sheet for Healthcare Providers: SeriousBroker.it  This test is not yet approved or cleared by the [place] FDA and has been authorized for detection and/or diagnosis of SARS-CoV-2 by FDA under an Emergency Use Authorization (EUA). This EUA will remain in effect (meaning this test can be used) for the duration of the COVID-19 declaration under Section 564(b)(1) of the Act, 21 U.S.C. section 360bbb-3(b)(1), unless the authorization is terminated or revoked.  Performed at [facility], [address] [place], [place] [address]      Labs: BNP (last 3 results) Recent Labs    11/01/20 0405 11/02/20 0350 11/03/20 0155  BNP 15.0 8.6 6.0   Basic Metabolic Panel: Recent Labs  Lab 10/30/20 1925 10/31/20 0500 11/01/20 0207 11/02/20 0350 11/03/20 0155  NA 140 139 139 141 138  K 3.5 3.8 4.0 3.6 3.9  CL 106 105 108 109 105  CO2 GLUCOSE 95 119* 131* 164* 222*  BUN 7 6 <5* 6 10  CREATININE 0.81 0.65 0.63 0.75 0.69  CALCIUM 9.9 9.3 9.2 9.0 9.1  MG 2.1 2.2 2.1 2.1 2.4  PHOS  --  4.4  --   --   --    Liver Function Tests: Recent Labs  Lab 10/30/20 1925 10/31/20 0500 11/01/20 0207 11/02/20 0350 11/03/20 0155  AST 67* 63* 55* 46* 39  ALT 56* 52* 45* 46* 40  ALKPHOS 70 65 58 60 60  BILITOT 0.9 1.0 1.5* 0.8 0.3  PROT 8.1 7.3 6.2* 6.4* 6.3*  ALBUMIN 4.2 3.9 3.3* 3.1* 3.1*   No results for input(s): LIPASE, AMYLASE in the last 168 hours. Recent Labs  Lab 10/28/20 2013  AMMONIA 33   CBC: Recent Labs  Lab 10/30/20 1925 10/31/20 1745 11/01/20 0207 11/02/20 0350 11/03/20 0155  WBC 9.1 8.3 6.0 7.8 8.7  NEUTROABS 5.1 3.2 2.0 3.5 4.9  HGB 13.5 14.3 12.9 13.5 11.6*  HCT 40.7 43.0 41.0 41.1 36.0  MCV 91.1 91.3 96.7 92.8 92.8  PLT 403* 398  PLATELET CLUMPS NOTED ON SMEAR, UNABLE TO ESTIMATE 376 363   Cardiac Enzymes: Recent Labs  Lab 10/31/20 0500  CKTOTAL 413*   BNP: Invalid input(s): POCBNP CBG: Recent Labs  Lab 11/02/20 2020 11/03/20 0007 11/03/20 0517 11/03/20 0755 11/03/20 1218  GLUCAP 324* 276* 210* 192* 356*   D-Dimer No results for input(s): DDIMER in the last 72 hours. Hgb A1c No results for input(s): HGBA1C in the last 72 hours. Lipid Profile No results for input(s): CHOL, HDL, LDLCALC, TRIG, CHOLHDL, LDLDIRECT in the last 72 hours. Thyroid function studies No results for input(s): TSH, T4TOTAL, T3FREE, THYROIDAB in the last 72 hours.  Invalid input(s): FREET3 Anemia work up No  results for input(s): VITAMINB12, FOLATE, FERRITIN, TIBC, IRON, RETICCTPCT in the last 72 hours. Urinalysis    Component Value Date/Time   COLORURINE YELLOW 10/31/2020 1426   APPEARANCEUR CLEAR 10/31/2020 1426   LABSPEC 1.017 10/31/2020 1426   PHURINE 5.0 10/31/2020 1426   GLUCOSEU NEGATIVE 10/31/2020 1426   HGBUR NEGATIVE 10/31/2020 1426   BILIRUBINUR NEGATIVE 10/31/2020 1426   KETONESUR 5 (A) 10/31/2020 1426   PROTEINUR NEGATIVE 10/31/2020 1426   UROBILINOGEN 0.2 04/27/2010 1438   NITRITE NEGATIVE 10/31/2020 1426   LEUKOCYTESUR NEGATIVE 10/31/2020 1426   Sepsis Labs Invalid input(s): PROCALCITONIN,  WBC,  LACTICIDVEN Microbiology Recent Results (from the past 240 hour(s))  Resp Panel by RT-PCR (Flu A&B, Covid) Nasopharyngeal Swab     Status: None   Collection Time: 10/28/20  9:24 PM   Specimen: Nasopharyngeal Swab; Nasopharyngeal(NP) swabs in vial transport medium  Result Value Ref Range Status   SARS Coronavirus 2 by RT PCR NEGATIVE NEGATIVE Final    Comment: (NOTE) SARS-CoV-2 target nucleic acids are NOT DETECTED.  The SARS-CoV-2 RNA is generally detectable in upper respiratory specimens during the acute phase of infection. The lowest concentration of SARS-CoV-2 viral copies this assay can detect is 138  copies/mL. A negative result does not preclude SARS-Cov-2 infection and should not be used as the sole basis for treatment or other patient management decisions. A negative result may occur with  improper specimen collection/handling, submission of specimen other than nasopharyngeal swab, presence of viral mutation(s) within the areas targeted by this assay, and inadequate number of viral copies(<138 copies/mL). A negative result must be combined with clinical observations, patient history, and epidemiological information. The expected result is Negative.  Fact Sheet for Patients:  BloggerCourse.com  Fact Sheet for Healthcare Providers:  SeriousBroker.it  This test is no t yet approved or cleared by the [place] FDA and  has been authorized for detection and/or diagnosis of SARS-CoV-2 by FDA under an Emergency Use Authorization (EUA). This EUA will remain  in effect (meaning this test can be used) for the duration of the COVID-19 declaration under Section 564(b)(1) of the Act, 21 U.S.C.section 360bbb-3(b)(1), unless the authorization is terminated  or revoked sooner.       Influenza A by PCR NEGATIVE NEGATIVE Final   Influenza B by PCR NEGATIVE NEGATIVE Final    Comment: (NOTE) The Xpert Xpress SARS-CoV-2/FLU/RSV plus assay is intended as an aid in the diagnosis of influenza from Nasopharyngeal swab specimens and should not be used as a sole basis for treatment. Nasal washings and aspirates are unacceptable for Xpert Xpress SARS-CoV-2/FLU/RSV testing.  Fact Sheet for Patients: BloggerCourse.com  Fact Sheet for Healthcare Providers: SeriousBroker.it  This test is not yet approved or cleared by the [place] FDA and has been authorized for detection and/or diagnosis of SARS-CoV-2 by FDA under an Emergency Use Authorization (EUA). This EUA will remain in effect (meaning  this test can be used) for the duration of the COVID-19 declaration under Section 564(b)(1) of the Act, 21 U.S.C. section 360bbb-3(b)(1), unless the authorization is terminated or revoked.  Performed at [facility], [address], [place], [place] [address]   Resp Panel by RT-PCR (Flu A&B, Covid) Nasopharyngeal Swab     Status: None   Collection Time: 10/30/20 10:22 PM   Specimen: Nasopharyngeal Swab; Nasopharyngeal(NP) swabs in vial transport medium  Result Value Ref Range Status   SARS Coronavirus 2 by RT PCR NEGATIVE NEGATIVE Final    Comment: (NOTE) SARS-CoV-2 target nucleic acids  are NOT DETECTED.  The SARS-CoV-2 RNA is generally detectable in upper respiratory specimens during the acute phase of infection. The lowest concentration of SARS-CoV-2 viral copies this assay can detect is 138 copies/mL. A negative result does not preclude SARS-Cov-2 infection and should not be used as the sole basis for treatment or other patient management decisions. A negative result may occur with  improper specimen collection/handling, submission of specimen other than nasopharyngeal swab, presence of viral mutation(s) within the areas targeted by this assay, and inadequate number of viral copies(<138 copies/mL). A negative result must be combined with clinical observations, patient history, and epidemiological information. The expected result is Negative.  Fact Sheet for Patients:  BloggerCourse.com  Fact Sheet for Healthcare Providers:  SeriousBroker.it  This test is no t yet approved or cleared by the [place] FDA and  has been authorized for detection and/or diagnosis of SARS-CoV-2 by FDA under an Emergency Use Authorization (EUA). This EUA will remain  in effect (meaning this test can be used) for the duration of the COVID-19 declaration under Section 564(b)(1) of the Act, 21 U.S.C.section 360bbb-3(b)(1), unless  the authorization is terminated  or revoked sooner.       Influenza A by PCR NEGATIVE NEGATIVE Final   Influenza B by PCR NEGATIVE NEGATIVE Final    Comment: (NOTE) The Xpert Xpress SARS-CoV-2/FLU/RSV plus assay is intended as an aid in the diagnosis of influenza from Nasopharyngeal swab specimens and should not be used as a sole basis for treatment. Nasal washings and aspirates are unacceptable for Xpert Xpress SARS-CoV-2/FLU/RSV testing.  Fact Sheet for Patients: BloggerCourse.com  Fact Sheet for Healthcare Providers: SeriousBroker.it  This test is not yet approved or cleared by the [place] FDA and has been authorized for detection and/or diagnosis of SARS-CoV-2 by FDA under an Emergency Use Authorization (EUA). This EUA will remain in effect (meaning this test can be used) for the duration of the COVID-19 declaration under Section 564(b)(1) of the Act, 21 U.S.C. section 360bbb-3(b)(1), unless the authorization is terminated or revoked.  Performed at [facility], [address] [place], [place] [address]    Time spent: 30 min  SIGNED:   Rickey Barbara, MD  [facility] 11/03/2020, 4:37 PM  If 7PM-7AM, please contact night-coverage

## 2020-11-03 NOTE — Plan of Care (Signed)
  Problem: Acute Rehab PT Goals(only PT should resolve) Goal: Pt Will Go Supine/Side To Sit Outcome: Adequate for Discharge Goal: Pt Will Go Sit To Supine/Side Outcome: Adequate for Discharge Goal: Patient Will Transfer Sit To/From Stand Outcome: Adequate for Discharge Goal: Pt Will Transfer Bed To Chair/Chair To Bed Outcome: Adequate for Discharge Goal: Pt Will Ambulate Outcome: Adequate for Discharge   Problem: Education: Goal: Knowledge of General Education information will improve Description: Including pain rating scale, medication(s)/side effects and non-pharmacologic comfort measures Outcome: Adequate for Discharge   Problem: Health Behavior/Discharge Planning: Goal: Ability to manage health-related needs will improve Outcome: Adequate for Discharge   Problem: Clinical Measurements: Goal: Ability to maintain clinical measurements within normal limits will improve Outcome: Adequate for Discharge Goal: Will remain free from infection Outcome: Adequate for Discharge Goal: Diagnostic test results will improve Outcome: Adequate for Discharge Goal: Respiratory complications will improve Outcome: Adequate for Discharge Goal: Cardiovascular complication will be avoided Outcome: Adequate for Discharge   Problem: Activity: Goal: Risk for activity intolerance will decrease Outcome: Adequate for Discharge   Problem: Nutrition: Goal: Adequate nutrition will be maintained Outcome: Adequate for Discharge   Problem: Coping: Goal: Level of anxiety will decrease Outcome: Adequate for Discharge   Problem: Elimination: Goal: Will not experience complications related to bowel motility Outcome: Adequate for Discharge Goal: Will not experience complications related to urinary retention Outcome: Adequate for Discharge   Problem: Pain Managment: Goal: General experience of comfort will improve Outcome: Adequate for Discharge   Problem: Safety: Goal: Ability to remain free from  injury will improve Outcome: Adequate for Discharge   Problem: Skin Integrity: Goal: Risk for impaired skin integrity will decrease Outcome: Adequate for Discharge   Problem: Education: Goal: Expressions of having a comfortable level of knowledge regarding the disease process will increase Outcome: Adequate for Discharge   Problem: Coping: Goal: Ability to adjust to condition or change in health will improve Outcome: Adequate for Discharge Goal: Ability to identify appropriate support needs will improve Outcome: Adequate for Discharge   Problem: Health Behavior/Discharge Planning: Goal: Compliance with prescribed medication regimen will improve Outcome: Adequate for Discharge   Problem: Medication: Goal: Risk for medication side effects will decrease Outcome: Adequate for Discharge   Problem: Clinical Measurements: Goal: Complications related to the disease process, condition or treatment will be avoided or minimized Outcome: Adequate for Discharge Goal: Diagnostic test results will improve Outcome: Adequate for Discharge   Problem: Safety: Goal: Verbalization of understanding the information provided will improve Outcome: Adequate for Discharge   Problem: Self-Concept: Goal: Level of anxiety will decrease Outcome: Adequate for Discharge Goal: Ability to verbalize feelings about condition will improve Outcome: Adequate for Discharge   Problem: Acute Rehab OT Goals (only OT should resolve) Goal: Pt. Will Perform Grooming Outcome: Adequate for Discharge Goal: Pt. Will Perform Lower Body Dressing Outcome: Adequate for Discharge Goal: Pt. Will Transfer To Toilet Outcome: Adequate for Discharge

## 2020-11-03 NOTE — Progress Notes (Signed)
CSW met with pt to complete initial assessment.  Pt initially responsive, reports she lives with sister [name], permission given for CSW to speak with sister.  Pt then stopped answering questions and looked somewhat fearful.  Lurline Idol, MSW, LCSW 5/18/202210:16 AM

## 2021-01-23 ENCOUNTER — Encounter (HOSPITAL_COMMUNITY): Payer: Self-pay | Admitting: Emergency Medicine

## 2021-01-23 ENCOUNTER — Inpatient Hospital Stay (HOSPITAL_COMMUNITY)
Admission: EM | Admit: 2021-01-23 | Discharge: 2021-03-25 | DRG: 638 | Disposition: A | Discharge status: Home / Self Care | Payer: Medicaid Other | Attending: Internal Medicine | Admitting: Internal Medicine

## 2021-01-23 ENCOUNTER — Emergency Department (HOSPITAL_COMMUNITY): Payer: Medicaid Other

## 2021-01-23 ENCOUNTER — Other Ambulatory Visit: Payer: Self-pay

## 2021-01-23 DIAGNOSIS — E11649 Type 2 diabetes mellitus with hypoglycemia without coma: Principal | ICD-10-CM | POA: Diagnosis present

## 2021-01-23 DIAGNOSIS — R7401 Elevation of levels of liver transaminase levels: Secondary | ICD-10-CM | POA: Diagnosis not present

## 2021-01-23 DIAGNOSIS — Y92009 Unspecified place in unspecified non-institutional (private) residence as the place of occurrence of the external cause: Secondary | ICD-10-CM

## 2021-01-23 DIAGNOSIS — R000 Tachycardia, unspecified: Secondary | ICD-10-CM | POA: Diagnosis not present

## 2021-01-23 DIAGNOSIS — E46 Unspecified protein-calorie malnutrition: Secondary | ICD-10-CM | POA: Diagnosis present

## 2021-01-23 DIAGNOSIS — Z781 Physical restraint status: Secondary | ICD-10-CM

## 2021-01-23 DIAGNOSIS — F89 Unspecified disorder of psychological development: Secondary | ICD-10-CM | POA: Diagnosis present

## 2021-01-23 DIAGNOSIS — G40909 Epilepsy, unspecified, not intractable, without status epilepticus: Secondary | ICD-10-CM | POA: Diagnosis present

## 2021-01-23 DIAGNOSIS — Z5329 Procedure and treatment not carried out because of patient's decision for other reasons: Secondary | ICD-10-CM | POA: Diagnosis not present

## 2021-01-23 DIAGNOSIS — I959 Hypotension, unspecified: Secondary | ICD-10-CM | POA: Diagnosis not present

## 2021-01-23 DIAGNOSIS — Z880 Allergy status to penicillin: Secondary | ICD-10-CM

## 2021-01-23 DIAGNOSIS — F819 Developmental disorder of scholastic skills, unspecified: Secondary | ICD-10-CM

## 2021-01-23 DIAGNOSIS — E876 Hypokalemia: Secondary | ICD-10-CM | POA: Diagnosis present

## 2021-01-23 DIAGNOSIS — F70 Mild intellectual disabilities: Secondary | ICD-10-CM | POA: Diagnosis present

## 2021-01-23 DIAGNOSIS — Z8249 Family history of ischemic heart disease and other diseases of the circulatory system: Secondary | ICD-10-CM

## 2021-01-23 DIAGNOSIS — Z7984 Long term (current) use of oral hypoglycemic drugs: Secondary | ICD-10-CM

## 2021-01-23 DIAGNOSIS — K5900 Constipation, unspecified: Secondary | ICD-10-CM

## 2021-01-23 DIAGNOSIS — Z79899 Other long term (current) drug therapy: Secondary | ICD-10-CM

## 2021-01-23 DIAGNOSIS — Z91018 Allergy to other foods: Secondary | ICD-10-CM

## 2021-01-23 DIAGNOSIS — R6250 Unspecified lack of expected normal physiological development in childhood: Secondary | ICD-10-CM | POA: Diagnosis present

## 2021-01-23 DIAGNOSIS — K5649 Other impaction of intestine: Secondary | ICD-10-CM | POA: Diagnosis not present

## 2021-01-23 DIAGNOSIS — Z20822 Contact with and (suspected) exposure to covid-19: Secondary | ICD-10-CM | POA: Diagnosis present

## 2021-01-23 DIAGNOSIS — G9349 Other encephalopathy: Secondary | ICD-10-CM | POA: Diagnosis present

## 2021-01-23 DIAGNOSIS — R0602 Shortness of breath: Secondary | ICD-10-CM

## 2021-01-23 DIAGNOSIS — I10 Essential (primary) hypertension: Secondary | ICD-10-CM | POA: Diagnosis present

## 2021-01-23 DIAGNOSIS — R4182 Altered mental status, unspecified: Secondary | ICD-10-CM | POA: Diagnosis present

## 2021-01-23 DIAGNOSIS — F319 Bipolar disorder, unspecified: Secondary | ICD-10-CM

## 2021-01-23 DIAGNOSIS — F5089 Other specified eating disorder: Secondary | ICD-10-CM

## 2021-01-23 DIAGNOSIS — I252 Old myocardial infarction: Secondary | ICD-10-CM

## 2021-01-23 DIAGNOSIS — Z4659 Encounter for fitting and adjustment of other gastrointestinal appliance and device: Secondary | ICD-10-CM

## 2021-01-23 DIAGNOSIS — E785 Hyperlipidemia, unspecified: Secondary | ICD-10-CM | POA: Diagnosis present

## 2021-01-23 DIAGNOSIS — E119 Type 2 diabetes mellitus without complications: Secondary | ICD-10-CM

## 2021-01-23 DIAGNOSIS — G9340 Encephalopathy, unspecified: Secondary | ICD-10-CM | POA: Diagnosis present

## 2021-01-23 DIAGNOSIS — T472X6A Underdosing of stimulant laxatives, initial encounter: Secondary | ICD-10-CM | POA: Diagnosis present

## 2021-01-23 DIAGNOSIS — Z794 Long term (current) use of insulin: Secondary | ICD-10-CM

## 2021-01-23 DIAGNOSIS — E1165 Type 2 diabetes mellitus with hyperglycemia: Secondary | ICD-10-CM | POA: Diagnosis not present

## 2021-01-23 DIAGNOSIS — R630 Anorexia: Secondary | ICD-10-CM

## 2021-01-23 DIAGNOSIS — F315 Bipolar disorder, current episode depressed, severe, with psychotic features: Secondary | ICD-10-CM | POA: Diagnosis present

## 2021-01-23 DIAGNOSIS — E162 Hypoglycemia, unspecified: Secondary | ICD-10-CM | POA: Diagnosis present

## 2021-01-23 DIAGNOSIS — E669 Obesity, unspecified: Secondary | ICD-10-CM | POA: Diagnosis present

## 2021-01-23 DIAGNOSIS — Z6830 Body mass index (BMI) 30.0-30.9, adult: Secondary | ICD-10-CM

## 2021-01-23 DIAGNOSIS — R600 Localized edema: Secondary | ICD-10-CM | POA: Diagnosis present

## 2021-01-23 DIAGNOSIS — F061 Catatonic disorder due to known physiological condition: Secondary | ICD-10-CM | POA: Diagnosis present

## 2021-01-23 DIAGNOSIS — R4781 Slurred speech: Secondary | ICD-10-CM | POA: Diagnosis present

## 2021-01-23 DIAGNOSIS — Z882 Allergy status to sulfonamides status: Secondary | ICD-10-CM

## 2021-01-23 LAB — DIFFERENTIAL
Abs Immature Granulocytes: 0.02 10*3/uL (ref 0.00–0.07)
Basophils Absolute: 0.1 10*3/uL (ref 0.0–0.1)
Basophils Relative: 1 %
Eosinophils Absolute: 0.1 10*3/uL (ref 0.0–0.5)
Eosinophils Relative: 1 %
Immature Granulocytes: 0 %
Lymphocytes Relative: 40 %
Lymphs Abs: 3.7 10*3/uL (ref 0.7–4.0)
Monocytes Absolute: 0.6 10*3/uL (ref 0.1–1.0)
Monocytes Relative: 7 %
Neutro Abs: 4.7 10*3/uL (ref 1.7–7.7)
Neutrophils Relative %: 51 %

## 2021-01-23 LAB — LIPASE, BLOOD: Lipase: 28 U/L (ref 11–51)

## 2021-01-23 LAB — CBC
HCT: 42.1 % (ref 36.0–46.0)
Hemoglobin: 13.7 g/dL (ref 12.0–15.0)
MCH: 29.8 pg (ref 26.0–34.0)
MCHC: 32.5 g/dL (ref 30.0–36.0)
MCV: 91.5 fL (ref 80.0–100.0)
Platelets: 442 10*3/uL — ABNORMAL HIGH (ref 150–400)
RBC: 4.6 MIL/uL (ref 3.87–5.11)
RDW: 12.3 % (ref 11.5–15.5)
WBC: 9.2 10*3/uL (ref 4.0–10.5)
nRBC: 0 % (ref 0.0–0.2)

## 2021-01-23 LAB — I-STAT CHEM 8, ED
BUN: 12 mg/dL (ref 6–20)
Calcium, Ion: 1.15 mmol/L (ref 1.15–1.40)
Chloride: 102 mmol/L (ref 98–111)
Creatinine, Ser: 0.8 mg/dL (ref 0.44–1.00)
Glucose, Bld: 81 mg/dL (ref 70–99)
HCT: 43 % (ref 36.0–46.0)
Hemoglobin: 14.6 g/dL (ref 12.0–15.0)
Potassium: 3.5 mmol/L (ref 3.5–5.1)
Sodium: 140 mmol/L (ref 135–145)
TCO2: 27 mmol/L (ref 22–32)

## 2021-01-23 LAB — PROTIME-INR
INR: 0.9 (ref 0.8–1.2)
Prothrombin Time: 12.3 seconds (ref 11.4–15.2)

## 2021-01-23 LAB — COMPREHENSIVE METABOLIC PANEL
ALT: 28 U/L (ref 0–44)
AST: 39 U/L (ref 15–41)
Albumin: 3.9 g/dL (ref 3.5–5.0)
Alkaline Phosphatase: 75 U/L (ref 38–126)
Anion gap: 13 (ref 5–15)
BUN: 11 mg/dL (ref 6–20)
CO2: 23 mmol/L (ref 22–32)
Calcium: 9.8 mg/dL (ref 8.9–10.3)
Chloride: 102 mmol/L (ref 98–111)
Creatinine, Ser: 0.96 mg/dL (ref 0.44–1.00)
GFR, Estimated: >60 mL/min (ref >60)
Glucose, Bld: 81 mg/dL (ref 70–99)
Potassium: 3.5 mmol/L (ref 3.5–5.1)
Sodium: 138 mmol/L (ref 135–145)
Total Bilirubin: 0.9 mg/dL (ref 0.3–1.2)
Total Protein: 8.1 g/dL (ref 6.5–8.1)

## 2021-01-23 LAB — I-STAT BETA HCG BLOOD, ED (MC, WL, AP ONLY): I-stat hCG, quantitative: <5 m[IU]/mL (ref <5)

## 2021-01-23 LAB — APTT: aPTT: 29 seconds (ref 24–36)

## 2021-01-23 MED ORDER — SODIUM CHLORIDE 0.9% FLUSH
3.0000 mL | Freq: Once | INTRAVENOUS | Status: AC
Start: 2021-01-23 — End: 2021-02-11
  Administered 2021-02-11: 3 mL via INTRAVENOUS

## 2021-01-23 NOTE — ED Provider Notes (Signed)
Emergency Medicine Provider Triage Evaluation Note  [name] , a 54 y.o. female  was evaluated in triage.  Pt complains of decreased po intake and slurred speech. Slurred speech started yesterday. Patient admits to generalized abdominal pain. No BM x2 weeks. Sister at bedside gave history. Patient lives with sister. Sister notes these are similar symptoms that occurred last admission when she had a seizure. No witnessed seizure.  Review of Systems  Positive: Slurred speech, abdominal pain Negative: fever  Physical Exam  There were no vitals taken for this visit. Gen:   Awake, no distress   Resp:  Normal effort  MSK:   Moves extremities without difficulty  Other:    Medical Decision Making  Medically screening exam initiated at 7:06 PM.  Appropriate orders placed.  [name] was informed that the remainder of the evaluation will be completed by another provider, this initial triage assessment does not replace that evaluation, and the importance of remaining in the ED until their evaluation is complete.  Labs CT head UA   Jesusita Oka 01/23/21 Windell Moment    Ernie Avena, MD 01/23/21 Norberta Keens

## 2021-01-23 NOTE — ED Triage Notes (Signed)
Sister states pt isn't eating for the past few days and has been saying things like she doesn't have teeth, that she can't change clothes because her clothes are too small, shoes are too small, and that she can't get in car because she is too big.  Reports slurred speech that started yesterday.  Pt reports generalized abd pain.  No BM x 2 weeks.  Sister gave her Doculax today.  No arm drift.

## 2021-01-24 ENCOUNTER — Observation Stay (HOSPITAL_COMMUNITY): Payer: Medicaid Other

## 2021-01-24 ENCOUNTER — Emergency Department (HOSPITAL_COMMUNITY): Payer: Medicaid Other

## 2021-01-24 DIAGNOSIS — K5900 Constipation, unspecified: Secondary | ICD-10-CM

## 2021-01-24 DIAGNOSIS — F319 Bipolar disorder, unspecified: Secondary | ICD-10-CM

## 2021-01-24 DIAGNOSIS — G9340 Encephalopathy, unspecified: Secondary | ICD-10-CM

## 2021-01-24 DIAGNOSIS — E119 Type 2 diabetes mellitus without complications: Secondary | ICD-10-CM

## 2021-01-24 LAB — RAPID URINE DRUG SCREEN, HOSP PERFORMED
Amphetamines: NOT DETECTED
Barbiturates: NOT DETECTED
Benzodiazepines: NOT DETECTED
Cocaine: NOT DETECTED
Opiates: NOT DETECTED
Tetrahydrocannabinol: NOT DETECTED

## 2021-01-24 LAB — URINALYSIS, ROUTINE W REFLEX MICROSCOPIC
Bilirubin Urine: NEGATIVE
Glucose, UA: 150 mg/dL — AB
Hgb urine dipstick: NEGATIVE
Ketones, ur: 20 mg/dL — AB
Leukocytes,Ua: NEGATIVE
Nitrite: NEGATIVE
Protein, ur: NEGATIVE mg/dL
Specific Gravity, Urine: 1.021 (ref 1.005–1.030)
pH: 5 (ref 5.0–8.0)

## 2021-01-24 LAB — SARS CORONAVIRUS 2 (TAT 6-24 HRS): SARS Coronavirus 2: NEGATIVE

## 2021-01-24 LAB — GLUCOSE, CAPILLARY: Glucose-Capillary: 163 mg/dL — ABNORMAL HIGH (ref 70–99)

## 2021-01-24 LAB — CBG MONITORING, ED
Glucose-Capillary: 127 mg/dL — ABNORMAL HIGH (ref 70–99)
Glucose-Capillary: 127 mg/dL — ABNORMAL HIGH (ref 70–99)
Glucose-Capillary: 145 mg/dL — ABNORMAL HIGH (ref 70–99)
Glucose-Capillary: 163 mg/dL — ABNORMAL HIGH (ref 70–99)
Glucose-Capillary: 61 mg/dL — ABNORMAL LOW (ref 70–99)
Glucose-Capillary: 71 mg/dL (ref 70–99)

## 2021-01-24 LAB — AMMONIA: Ammonia: 24 umol/L (ref 9–35)

## 2021-01-24 LAB — VITAMIN B12: Vitamin B-12: 557 pg/mL (ref 180–914)

## 2021-01-24 LAB — TSH: TSH: 2.357 u[IU]/mL (ref 0.350–4.500)

## 2021-01-24 LAB — HEMOGLOBIN A1C
Hgb A1c MFr Bld: 6.4 % — ABNORMAL HIGH (ref 4.8–5.6)
Mean Plasma Glucose: 136.98 mg/dL

## 2021-01-24 MED ORDER — DEXTROSE 50 % IV SOLN
50.0000 mL | Freq: Once | INTRAVENOUS | Status: AC
  Administered 2021-01-24: 50 mL via INTRAVENOUS
  Filled 2021-01-24: qty 50

## 2021-01-24 MED ORDER — DEXTROSE 5 % IV SOLN: INTRAVENOUS | Status: DC

## 2021-01-24 MED ORDER — IPRATROPIUM-ALBUTEROL 0.5-2.5 (3) MG/3ML IN SOLN: 3.0000 mL | RESPIRATORY_TRACT | Status: DC | PRN

## 2021-01-24 MED ORDER — SENNOSIDES-DOCUSATE SODIUM 8.6-50 MG PO TABS: 1.0000 | ORAL_TABLET | Freq: Every evening | ORAL | Status: DC | PRN

## 2021-01-24 MED ORDER — POLYETHYLENE GLYCOL 3350 17 G PO PACK: 17.0000 g | PACK | Freq: Every day | ORAL | Status: DC

## 2021-01-24 MED ORDER — DM-GUAIFENESIN ER 30-600 MG PO TB12
1.0000 | ORAL_TABLET | Freq: Two times a day (BID) | ORAL | Status: DC | PRN
  Filled 2021-01-24: qty 1

## 2021-01-24 MED ORDER — ACETAMINOPHEN 650 MG RE SUPP: 650.0000 mg | Freq: Four times a day (QID) | RECTAL | Status: DC | PRN

## 2021-01-24 MED ORDER — ACETAMINOPHEN 325 MG PO TABS: 650.0000 mg | ORAL_TABLET | Freq: Four times a day (QID) | ORAL | Status: DC | PRN

## 2021-01-24 MED ORDER — DEXTROSE 50 % IV SOLN
1.0000 | Freq: Once | INTRAVENOUS | Status: AC
  Administered 2021-01-24: 50 mL via INTRAVENOUS
  Filled 2021-01-24: qty 50

## 2021-01-24 MED ORDER — POLYETHYLENE GLYCOL 3350 17 G PO PACK
17.0000 g | PACK | Freq: Two times a day (BID) | ORAL | Status: DC
  Administered 2021-01-26: 17 g via ORAL
  Filled 2021-01-24 (×8): qty 1

## 2021-01-24 MED ORDER — ENOXAPARIN SODIUM 40 MG/0.4ML IJ SOSY
40.0000 mg | PREFILLED_SYRINGE | INTRAMUSCULAR | Status: DC
  Administered 2021-01-24 – 2021-03-25 (×58): 40 mg via SUBCUTANEOUS
  Filled 2021-01-24 (×61): qty 0.4

## 2021-01-24 MED ORDER — BISACODYL 10 MG RE SUPP: 10.0000 mg | Freq: Every day | RECTAL | Status: DC | PRN

## 2021-01-24 MED ORDER — BISACODYL 10 MG RE SUPP
10.0000 mg | Freq: Every day | RECTAL | Status: DC
  Administered 2021-01-24 – 2021-02-10 (×11): 10 mg via RECTAL
  Filled 2021-01-24 (×15): qty 1

## 2021-01-24 MED ORDER — HYDRALAZINE HCL 20 MG/ML IJ SOLN: 10.0000 mg | INTRAMUSCULAR | Status: DC | PRN

## 2021-01-24 NOTE — Progress Notes (Signed)
PROGRESS NOTE    [name]  DQQ:[id number] DOB: 03-05-1966 DOA: 01/23/2021 PCP: Mirna Mires, MD   Brief Narrative:  55 year old with history of intellectual disability, bipolar disorder, seizure disorder, DM2, HTN, HLD, immunodeficiency disorder presented to the ED for change in mental status as reported by patient's sister for the past couple of days at home.  Upon admission CT head was negative.  Abdominal x-ray showed moderate stool burden and patient was confused.  She does have history of constipation and was placed on Linzess and Dulcolax by GI but patient has been noncompliant.  She does have scheduled colonoscopy as outpatient.   Assessment & Plan:   Principal Problem:   Acute encephalopathy Active Problems:   Hypoglycemia   Constipation   Bipolar disorder (HCC)   Diabetes (HCC)  Acute encephalopathy - Unclear etiology.  Ongoing for 2 days.  CT head is negative.  She does have history of seizures.  Chest x-ray-negative - MRI brain-negative for acute pathology - EEG- WNL - UA- neg, UDS - neg - TSH, B12, ammonia - WNL  Moderate constipation - Aggressive bowel regimen.  Continue home Linzess - Enema if necessary  History of seizure - Need to confirm if she is on Keppra - EEG - WNL  Bipolar disorder - Resume risperidone and trazodone when confirmed   Diabetes mellitus type 2 with hyperglycemia - ER blood glucose down to 61 requiring amp of D50. Check A1c - Currently on D5 - Accu-Cheks, sliding scale and regular diet -Per records home meds which needs to be confirmed-Amaryl, metformin, Lantus, sliding scale  Essential hypertension -Lasix, needs to be confirmed  Hyperlipidemia -Zocor, needs to be confirmed     DVT prophylaxis: Lovenox Code Status: Full Family Communication:  Spoke with [name]  Status is: Observation  The patient remains OBS appropriate and will d/c before 2 midnights.  Dispo: The patient is from: Home              Anticipated d/c  is to: Home              Patient currently is not medically stable to d/c. On D5W, poor oral intake.    Difficult to place patient No      Subjective: Patient states she feels ok, she is AAOx3 but still poor oral intake.  Per sister she has been severely constipated, has poor oral intake.  Typically she states patient takes her oral meds, dresses herself, performs her own ADL S.  But in the last couple of days they have noticed some decline.  She is also been dealing with constipation and abdominal pain recently.  Review of Systems Otherwise negative except as per HPI, including: General: Denies fever, chills, night sweats or unintended weight loss. Resp: Denies cough, wheezing, shortness of breath. Cardiac: Denies chest pain, palpitations, orthopnea, paroxysmal nocturnal dyspnea. GI: Denies abdominal pain, nausea, vomiting, diarrhea or constipation GU: Denies dysuria, frequency, hesitancy or incontinence MS: Denies muscle aches, joint pain or swelling Neuro: Denies headache, neurologic deficits (focal weakness, numbness, tingling), abnormal gait Psych: Denies anxiety, depression, SI/HI/AVH Skin: Denies new rashes or lesions ID: Denies sick contacts, exotic exposures, travel  Examination:  Constitutional: Not in acute distress Respiratory: Clear to auscultation bilaterally Cardiovascular: Normal sinus rhythm, no rubs Abdomen: Nontender nondistended good bowel sounds Musculoskeletal: No edema noted Skin: No rashes seen Neurologic: CN 2-12 grossly intact.  And nonfocal Psychiatric: poor judgment and insight. Alert and oriented x 3.     Objective: Vitals:   01/24/21  0245 01/24/21 0338 01/24/21 0700 01/24/21 0715  BP: 122/73 114/66 (!) 126/53 118/63  Pulse: 98 97 93 90  Resp: 14 16 17 13   Temp:      TempSrc:      SpO2: 97% 96% 97% 98%   No intake or output data in the 24 hours ending 01/24/21 0829 There were no vitals filed for this visit.   Data Reviewed:    CBC: Recent Labs  Lab 01/23/21 1905 01/23/21 2004  WBC 9.2  --   NEUTROABS 4.7  --   HGB 13.7 14.6  HCT 42.1 43.0  MCV 91.5  --   PLT 442*  --    Basic Metabolic Panel: Recent Labs  Lab 01/23/21 1905 01/23/21 2004  NA 138 140  K 3.5 3.5  CL 102 102  CO2 23  --   GLUCOSE 81 81  BUN 11 12  CREATININE 0.96 0.80  CALCIUM 9.8  --    GFR: CrCl cannot be calculated (Unknown ideal weight.). Liver Function Tests: Recent Labs  Lab 01/23/21 1905  AST 39  ALT 28  ALKPHOS 75  BILITOT 0.9  PROT 8.1  ALBUMIN 3.9   Recent Labs  Lab 01/23/21 1905  LIPASE 28   No results for input(s): AMMONIA in the last 168 hours. Coagulation Profile: Recent Labs  Lab 01/23/21 1905  INR 0.9   Cardiac Enzymes: No results for input(s): CKTOTAL, CKMB, CKMBINDEX, TROPONINI in the last 168 hours. BNP (last 3 results) No results for input(s): PROBNP in the last 8760 hours. HbA1C: No results for input(s): HGBA1C in the last 72 hours. CBG: Recent Labs  Lab 01/24/21 0028 01/24/21 0253 01/24/21 0544  GLUCAP 61* 163* 71   Lipid Profile: No results for input(s): CHOL, HDL, LDLCALC, TRIG, CHOLHDL, LDLDIRECT in the last 72 hours. Thyroid Function Tests: No results for input(s): TSH, T4TOTAL, FREET4, T3FREE, THYROIDAB in the last 72 hours. Anemia Panel: No results for input(s): VITAMINB12, FOLATE, FERRITIN, TIBC, IRON, RETICCTPCT in the last 72 hours. Sepsis Labs: No results for input(s): PROCALCITON, LATICACIDVEN in the last 168 hours.  Recent Results (from the past 240 hour(s))  SARS CORONAVIRUS 2 (TAT 6-24 HRS) Nasopharyngeal Nasopharyngeal Swab     Status: None   Collection Time: 01/24/21 12:23 AM   Specimen: Nasopharyngeal Swab  Result Value Ref Range Status   SARS Coronavirus 2 NEGATIVE NEGATIVE Final    Comment: (NOTE) SARS-CoV-2 target nucleic acids are NOT DETECTED.  The SARS-CoV-2 RNA is generally detectable in upper and lower respiratory specimens during the acute  phase of infection. Negative results do not preclude SARS-CoV-2 infection, do not rule out co-infections with other pathogens, and should not be used as the sole basis for treatment or other patient management decisions. Negative results must be combined with clinical observations, patient history, and epidemiological information. The expected result is Negative.  Fact Sheet for Patients: 03/26/21  Fact Sheet for Healthcare Providers: HairSlick.no  This test is not yet approved or cleared by the [place] FDA and  has been authorized for detection and/or diagnosis of SARS-CoV-2 by FDA under an Emergency Use Authorization (EUA). This EUA will remain  in effect (meaning this test can be used) for the duration of the COVID-19 declaration under Se ction 564(b)(1) of the Act, 21 U.S.C. section 360bbb-3(b)(1), unless the authorization is terminated or revoked sooner.  Performed at [facility], [address], [place], [place]          Radiology Studies: DG  Abdomen 1 View  Result Date: 01/24/2021 CLINICAL DATA:  Constipation EXAM: ABDOMEN - 1 VIEW COMPARISON:  None. FINDINGS: Moderate stool burden within the colon. There is a non obstructive bowel gas pattern. No supine evidence of free air. No organomegaly or suspicious calcification. No acute bony abnormality. IMPRESSION: Moderate stool burden.  No acute findings. Electronically Signed   By: Charlett Nose M.D.   On: 01/24/2021 00:46   CT HEAD WO CONTRAST  Result Date: 01/23/2021 CLINICAL DATA:  Neuro deficit EXAM: CT HEAD WITHOUT CONTRAST TECHNIQUE: Contiguous axial images were obtained from the base of the skull through the vertex without intravenous contrast. COMPARISON:  10/30/2020 FINDINGS: Brain: No evidence of acute infarction, hemorrhage, hydrocephalus, extra-axial collection or mass lesion/mass effect. Vascular: No hyperdense vessel or unexpected  calcification. Skull: Normal. Negative for fracture or focal lesion. Sinuses/Orbits: Mucosal thickening, retention cyst, or polyp in the RIGHT maxillary sinus is stable. Other: None. IMPRESSION: No evidence for acute intracranial abnormality. Electronically Signed   By: Norva Pavlov M.D.   On: 01/23/2021 20:10   DG Chest Port 1 View  Result Date: 01/24/2021 CLINICAL DATA:  Constipation EXAM: PORTABLE CHEST 1 VIEW COMPARISON:  10/31/2020 FINDINGS: The heart size and mediastinal contours are within normal limits. Both lungs are clear. The visualized skeletal structures are unremarkable. IMPRESSION: No active disease. Electronically Signed   By: Charlett Nose M.D.   On: 01/24/2021 00:45        Scheduled Meds:  enoxaparin (LOVENOX) injection  40 mg Subcutaneous Q24H   polyethylene glycol  17 g Oral Daily   sodium chloride flush  3 mL Intravenous Once   Continuous Infusions:  dextrose       LOS: 0 days   Time spent= 35 mins    Zanita Millman Joline Maxcy, MD [facility]  If 7PM-7AM, please contact night-coverage  01/24/2021, 8:29 AM

## 2021-01-24 NOTE — Progress Notes (Signed)
EEG complete - results pending 

## 2021-01-24 NOTE — ED Notes (Signed)
Patient transported to MRI 

## 2021-01-24 NOTE — ED Notes (Signed)
Introduced myself to pt. Oriented to self and place only. Resting in bed comfortably. Didn't eat lunch. Denies further needs. Sleeping on/off.

## 2021-01-24 NOTE — Progress Notes (Signed)
   [name] is a 55 y.o. female patient admitted from ED, patient arrived to the unit via stretcher at 1755, She is  awake, alert  & orientated  X 3,  Full Code, VSS - Blood pressure (!) 125/53, pulse 85, temperature 98.2 F (36.8 C), temperature source Oral, resp. rate 19, SpO2 99 %. Room air no c/o shortness of breath, no c/o chest pain,    IV site is on  hand right, condition patent and no redness with a transparent dsg that's clean dry and intact.  Admission INP armband ID verified with patient, and in place. SR up x 2.  Skin, clean-dry- intact without evidence of bruising, or skin tears.   No evidence of skin break down noted on exam.  Will cont to monitor and assist as needed.  Melvenia Needles, RN 01/24/2021 7:55 PM

## 2021-01-24 NOTE — ED Notes (Signed)
Admitting doctor at  The bedside 

## 2021-01-24 NOTE — Procedures (Signed)
Patient Name: SOMAYA GRASSI  MRN: [id number]  Epilepsy Attending: Charlsie Quest  Referring Physician/Provider: Dr Eligha Bridegroom Date: 01/24/2021 Duration: 23.56 mins  Patient history: 55 year old female with altered mental status.  EEG to evaluate for seizures.  Level of alertness: Awake  AEDs during EEG study: None  Technical aspects: This EEG study was done with scalp electrodes positioned according to the 10-20 International system of electrode placement. Electrical activity was acquired at a sampling rate of 500Hz  and reviewed with a high frequency filter of 70Hz  and a low frequency filter of 1Hz . EEG data were recorded continuously and digitally stored.   Description: The posterior dominant rhythm consists of 9-10 Hz activity of moderate voltage (25-35 uV) seen predominantly in posterior head regions, symmetric and reactive to eye opening and eye closing. Hyperventilation and photic stimulation were not performed.     IMPRESSION: This study is within normal limits. No seizures or epileptiform discharges were seen throughout the recording.  Ginevra Tacker 

## 2021-01-24 NOTE — ED Notes (Signed)
Patient transported for EEG.  

## 2021-01-24 NOTE — H&P (Signed)
History and Physical    Marissa DolphinCeleste H Seyler ZOX:[id number] DOB: [birth year] DOA: 01/23/2021  PCP: Mirna MiresHill, Gerald, MD Patient coming from: Home  Chief Complaint: Altered mental status  HPI: [name] is a 55 y.o. female with medical history significant of intellectual disability, bipolar disorder, seizure disorder, type II diabetes, hypertension, hyperlipidemia, immune deficiency disorder presenting to the ED for evaluation of altered mental status.  Patient's sister reported that she has not been eating for the past few days and has been saying things which are bizarre.  Reported slurred speech since yesterday.  Also reporting abdominal pain and no bowel movement in 2 weeks despite being given bisacodyl.  No witnessed seizure.  In the ED, slightly tachycardic on arrival, heart rate subsequently improved.  No fever.  Labs showing WBC 9.2, hemoglobin 13.7, platelet count 442.  Sodium 138, potassium 3.5, chloride 102, bicarb 23, BUN 11, creatinine 0.9, glucose 81.  Lipase and LFTs normal.  UA pending.  Head CT negative for acute finding.  Chest x-ray showing no active disease.  Abdominal x-ray showing moderate stool burden and nonobstructive bowel gas pattern.  Her blood glucose dropped to 61 in the ED and she was given IV dextrose.  Patient is confused.  States she is here because she has not been going to the bathroom.  Denies vomiting or abdominal pain.  Denies fevers, cough, shortness of breath, or chest pain.  Denies dysuria.  Not able to give any additional history.  Sister at bedside states patient has been very confused for the past 2 or 3 days.  She has been saying bizarre things such as she cannot wear her shoes because they are too small, she cannot wear her clothes because they are too small, she cannot get inside the car because she is too big.  States she has been talking with "baby voice."  Her speech has been slurred and sister has noticed that her lips and hands are trembling.  Sister states  when she talks to the patient she spaces out and does not give a reply.  She has stopped eating.  States previously patient was completely independent but these past few days is dependent on her sister even for bathing and dressing.  Sister is also concerned that the patient has been complaining of abdominal pain and her last bowel movement was 2 weeks ago.  States constipation is an ongoing problem for the patient for which her gastroenterologist has prescribed Linzess and Dulcolax but they are not helping.  Her gastroenterologist has scheduled her for a colonoscopy.  Review of Systems:  All systems reviewed and apart from history of presenting illness, are negative.  Past Medical History:  Diagnosis Date   Diabetes mellitus without complication (HCC)    Hypertension    Immune deficiency disorder (HCC)    Mild mental retardation     History reviewed. No pertinent surgical history.   reports that she has never smoked. She has never used smokeless tobacco. She reports that she does not drink alcohol and does not use drugs.  Allergies  Allergen Reactions   Penicillins    Sulfa Antibiotics    Tomato     Family History  Problem Relation Age of Onset   Hypertension Other     Prior to Admission medications   Medication Sig Start Date End Date Taking? Authorizing Provider  furosemide (LASIX) 20 MG tablet Take 20 mg by mouth daily.    [provider]  glimepiride (AMARYL) 2 MG tablet Take 2  mg by mouth daily before breakfast.    [provider]  insulin aspart (NOVOLOG) 100 UNIT/ML injection Inject 1-7 Units into the skin 3 (three) times daily as needed for high blood sugar (Per sliding scale: 120-150=1 unit, 150-200=2 units, 201-250=4 units, 251-300=6 units).    [provider]  insulin glargine (LANTUS) 100 unit/mL SOPN Inject 40 Units into the skin at bedtime.    [provider]  levETIRAcetam (KEPPRA) 1000 MG tablet Take 1 tablet (1,000 mg total) by  mouth 2 (two) times daily. 11/03/20 12/03/20  Jerald Kief, MD  loratadine (CLARITIN) 10 MG tablet Take 10 mg by mouth daily.    [provider]  metFORMIN (GLUCOPHAGE) 500 MG tablet Take 500 mg by mouth 2 (two) times daily with a meal.    [provider]  potassium chloride (KLOR-CON) 10 MEQ tablet Take 10 mEq by mouth daily.    [provider]  risperiDONE (RISPERDAL) 2 MG tablet Take 1 tablet (2 mg total) by mouth at bedtime. 11/03/20 12/03/20  Jerald Kief, MD  simvastatin (ZOCOR) 20 MG tablet Take 1 tablet (20 mg total) by mouth at bedtime. 11/03/20 12/03/20  Jerald Kief, MD  traZODone (DESYREL) 100 MG tablet Take 1 tablet (100 mg total) by mouth at bedtime as needed for sleep. 11/03/20 12/03/20  Jerald Kief, MD  valACYclovir (VALTREX) 500 MG tablet Take 500 mg by mouth at bedtime.    [provider]    Physical Exam: Vitals:   01/24/21 0146 01/24/21 0245 01/24/21 0338 01/24/21 0700  BP: 115/74 122/73 114/66 (!) 126/53  Pulse: 98 98 97 93  Resp: 18 14 16 17   Temp:      TempSrc:      SpO2: 94% 97% 96% 97%    Physical Exam Constitutional:      General: She is not in acute distress. HENT:     Head: Normocephalic and atraumatic.  Eyes:     Extraocular Movements: Extraocular movements intact.     Conjunctiva/sclera: Conjunctivae normal.  Cardiovascular:     Rate and Rhythm: Normal rate and regular rhythm.     Pulses: Normal pulses.  Pulmonary:     Effort: Pulmonary effort is normal. No respiratory distress.     Breath sounds: Normal breath sounds. No wheezing or rales.  Abdominal:     General: Bowel sounds are normal. There is no distension.     Palpations: Abdomen is soft.     Tenderness: There is no abdominal tenderness. There is no guarding or rebound.  Musculoskeletal:        General: No swelling or tenderness.     Cervical back: Normal range of motion and neck supple.  Skin:    General: Skin is warm and dry.  Neurological:      Mental Status: She is alert.     Cranial Nerves: No cranial nerve deficit.     Sensory: No sensory deficit.     Motor: No weakness.     Comments: Oriented to person and place only Confused, slow to respond to questions      Labs on Admission: I have personally reviewed following labs and imaging studies  CBC: Recent Labs  Lab 01/23/21 1905 01/23/21 2004  WBC 9.2  --   NEUTROABS 4.7  --   HGB 13.7 14.6  HCT 42.1 43.0  MCV 91.5  --   PLT 442*  --    Basic Metabolic Panel: Recent Labs  Lab 01/23/21 1905 01/23/21  2004  NA 138 140  K 3.5 3.5  CL 102 102  CO2 23  --   GLUCOSE 81 81  BUN 11 12  CREATININE 0.96 0.80  CALCIUM 9.8  --    GFR: CrCl cannot be calculated (Unknown ideal weight.). Liver Function Tests: Recent Labs  Lab 01/23/21 1905  AST 39  ALT 28  ALKPHOS 75  BILITOT 0.9  PROT 8.1  ALBUMIN 3.9   Recent Labs  Lab 01/23/21 1905  LIPASE 28   No results for input(s): AMMONIA in the last 168 hours. Coagulation Profile: Recent Labs  Lab 01/23/21 1905  INR 0.9   Cardiac Enzymes: No results for input(s): CKTOTAL, CKMB, CKMBINDEX, TROPONINI in the last 168 hours. BNP (last 3 results) No results for input(s): PROBNP in the last 8760 hours. HbA1C: No results for input(s): HGBA1C in the last 72 hours. CBG: Recent Labs  Lab 01/24/21 0028 01/24/21 0253 01/24/21 0544  GLUCAP 61* 163* 71   Lipid Profile: No results for input(s): CHOL, HDL, LDLCALC, TRIG, CHOLHDL, LDLDIRECT in the last 72 hours. Thyroid Function Tests: No results for input(s): TSH, T4TOTAL, FREET4, T3FREE, THYROIDAB in the last 72 hours. Anemia Panel: No results for input(s): VITAMINB12, FOLATE, FERRITIN, TIBC, IRON, RETICCTPCT in the last 72 hours. Urine analysis:    Component Value Date/Time   COLORURINE YELLOW 10/31/2020 1426   APPEARANCEUR CLEAR 10/31/2020 1426   LABSPEC 1.017 10/31/2020 1426   PHURINE 5.0 10/31/2020 1426   GLUCOSEU NEGATIVE 10/31/2020 1426   HGBUR  NEGATIVE 10/31/2020 1426   BILIRUBINUR NEGATIVE 10/31/2020 1426   KETONESUR 5 (A) 10/31/2020 1426   PROTEINUR NEGATIVE 10/31/2020 1426   UROBILINOGEN 0.2 04/27/2010 1438   NITRITE NEGATIVE 10/31/2020 1426   LEUKOCYTESUR NEGATIVE 10/31/2020 1426    Radiological Exams on Admission: DG Abdomen 1 View  Result Date: 01/24/2021 CLINICAL DATA:  Constipation EXAM: ABDOMEN - 1 VIEW COMPARISON:  None. FINDINGS: Moderate stool burden within the colon. There is a non obstructive bowel gas pattern. No supine evidence of free air. No organomegaly or suspicious calcification. No acute bony abnormality. IMPRESSION: Moderate stool burden.  No acute findings. Electronically Signed   By: Charlett Nose M.D.   On: 01/24/2021 00:46   CT HEAD WO CONTRAST  Result Date: 01/23/2021 CLINICAL DATA:  Neuro deficit EXAM: CT HEAD WITHOUT CONTRAST TECHNIQUE: Contiguous axial images were obtained from the base of the skull through the vertex without intravenous contrast. COMPARISON:  10/30/2020 FINDINGS: Brain: No evidence of acute infarction, hemorrhage, hydrocephalus, extra-axial collection or mass lesion/mass effect. Vascular: No hyperdense vessel or unexpected calcification. Skull: Normal. Negative for fracture or focal lesion. Sinuses/Orbits: Mucosal thickening, retention cyst, or polyp in the RIGHT maxillary sinus is stable. Other: None. IMPRESSION: No evidence for acute intracranial abnormality. Electronically Signed   By: Norva Pavlov M.D.   On: 01/23/2021 20:10   DG Chest Port 1 View  Result Date: 01/24/2021 CLINICAL DATA:  Constipation EXAM: PORTABLE CHEST 1 VIEW COMPARISON:  10/31/2020 FINDINGS: The heart size and mediastinal contours are within normal limits. Both lungs are clear. The visualized skeletal structures are unremarkable. IMPRESSION: No active disease. Electronically Signed   By: Charlett Nose M.D.   On: 01/24/2021 00:45    EKG: Independently reviewed.  Sinus tachycardia, no significant change since prior  tracing.  Assessment/Plan Principal Problem:   Acute encephalopathy Active Problems:   Hypoglycemia   Constipation   Bipolar disorder (HCC)   Diabetes (HCC)   Acute encephalopathy Patient is here for evaluation  of acute onset confusion and slurred speech which started 2 or 3 days ago.  Does have intellectual disability, bipolar disorder, and seizure disorder at baseline.  Head CT negative for acute finding.  Neuro exam nonfocal.  No fever, leukocytosis, or meningeal signs.  She was admitted back in May 2022 for confusion felt to be secondary to seizures.  She was started on Keppra but her sister showed me a handwritten list of her current medications which did not include any antiepileptics.  Sister is endorsing episodes of patient trembling and spacing out.  Exact cause of encephalopathy unknown at this time. -EEG and brain MRI ordered.  Check UA, UDS, TSH, B12, and ammonia.  Consult neurology in the morning.  Constipation Abdominal exam benign.  X-ray showing moderate stool burden and nonobstructive bowel gas pattern. -MiraLAX scheduled, Dulcolax suppository as needed.  Resume home Linzess after pharmacy med rec is done.  Bipolar disorder Risperidone and trazodone listed in home medications, however, per list of her current medications shown to me by her sister, she is taking risperidone only. -Pharmacy med rec pending  Hypoglycemia Insulin-dependent type 2 diabetes Hypoglycemia likely due to patient not eating for the past few days in the setting of sulfonylurea and insulin use.  Blood glucose was down to 61 in the ED and she was given IV dextrose.  Glucose initially improved to 163 but now back down to 71. -Hold Lantus and glimepiride.  Start D5 at 75 cc/h.  Encourage p.o. intake, regular diet.  CBG checks every 4 hours  Hypertension Stable. -Pharmacy med rec pending  Hyperlipidemia -Pharmacy med rec pending.  Seizure disorder Not on Keppra per her current list of medications  shown to me by her sister. -Pharmacy med rec pending.  EEG ordered.  DVT prophylaxis: Lovenox Code Status: Full code Family Communication: Sister at bedside. Disposition Plan: Status is: Observation  The patient remains OBS appropriate and will d/c before 2 midnights.  Dispo: The patient is from: Home              Anticipated d/c is to: Home              Patient currently is not medically stable to d/c.   Difficult to place patient No  Level of care: Level of care: Med-Surg  The medical decision making on this patient was of high complexity and the patient is at high risk for clinical deterioration, therefore this is a level 3 visit.  John Giovanni MD [facility]  If 7PM-7AM, please contact night-coverage [email or web address]  01/24/2021, 7:18 AM

## 2021-01-24 NOTE — ED Notes (Signed)
Attempted to call report 3W, they said they will call me back.

## 2021-01-24 NOTE — ED Notes (Signed)
Pt transported to floor via stretcher via tech. 

## 2021-01-24 NOTE — ED Provider Notes (Signed)
[facility] EMERGENCY DEPARTMENT Provider Note   CSN: [id number] Arrival date & time: 01/23/21  1853     History Chief Complaint  Patient presents with   Altered Mental Status   Constipation   Abdominal Pain    [name] is a 55 y.o. female.  The history is provided by a relative. The history is limited by the condition of the patient (Altered mental status).  Altered Mental Status Associated symptoms: abdominal pain   Constipation Associated symptoms: abdominal pain   Abdominal Pain Associated symptoms: constipation   He has history of hypertension, diabetes, immune deficiency, mental retardation and is brought in by family member because of altered mental status.  She has not been eating for the last several days and has been saying bizarre things.  She says that she cannot eat because she does not have teeth.  She states that she cannot get in a car because she is too big.  She states she cannot change her clothes because her clothes are too small.  Family member is also concerned that she has not had a bowel movement for the last 2 weeks.  She was given bisacodyl without any relief.  There has been no known fever or chills and no vomiting.  She had a somewhat similar presentation 2 months ago at which time she had a seizure.  She has not had any seizure with this episode.   Past Medical History:  Diagnosis Date   Diabetes mellitus without complication (HCC)    Hypertension    Immune deficiency disorder (HCC)    Mild mental retardation     Patient Active Problem List   Diagnosis Date Noted   Observed seizure-like activity (HCC) 10/31/2020   Seizure (HCC) 10/31/2020   DM (diabetes mellitus), type 2 with complications (HCC) 10/31/2020   Hypoglycemia 10/31/2020   Elevated LFTs 10/31/2020   Acute encephalopathy 10/30/2020   Change in mental status 10/29/2020   Intellectual disability    Confusion     History reviewed. No pertinent surgical  history.   OB History   No obstetric history on file.     Family History  Problem Relation Age of Onset   Hypertension Other     Social History   Tobacco Use   Smoking status: Never   Smokeless tobacco: Never  Substance Use Topics   Alcohol use: No   Drug use: Never    Home Medications Prior to Admission medications   Medication Sig Start Date End Date Taking? Authorizing Provider  furosemide (LASIX) 20 MG tablet Take 20 mg by mouth daily.    [provider]  glimepiride (AMARYL) 2 MG tablet Take 2 mg by mouth daily before breakfast.    [provider]  insulin aspart (NOVOLOG) 100 UNIT/ML injection Inject 1-7 Units into the skin 3 (three) times daily as needed for high blood sugar (Per sliding scale: 120-150=1 unit, 150-200=2 units, 201-250=4 units, 251-300=6 units).    [provider]  insulin glargine (LANTUS) 100 unit/mL SOPN Inject 40 Units into the skin at bedtime.    [provider]  levETIRAcetam (KEPPRA) 1000 MG tablet Take 1 tablet (1,000 mg total) by mouth 2 (two) times daily. 11/03/20 12/03/20  Jerald Kief, MD  loratadine (CLARITIN) 10 MG tablet Take 10 mg by mouth daily.    [provider]  metFORMIN (GLUCOPHAGE) 500 MG tablet Take 500 mg by mouth 2 (two) times daily with a meal.    [provider]  potassium chloride (KLOR-CON) 10 MEQ tablet Take 10 mEq by mouth daily.    [provider]  risperiDONE (RISPERDAL) 2 MG tablet Take 1 tablet (2 mg total) by mouth at bedtime. 11/03/20 12/03/20  Jerald Kief, MD  simvastatin (ZOCOR) 20 MG tablet Take 1 tablet (20 mg total) by mouth at bedtime. 11/03/20 12/03/20  Jerald Kief, MD  traZODone (DESYREL) 100 MG tablet Take 1 tablet (100 mg total) by mouth at bedtime as needed for sleep. 11/03/20 12/03/20  Jerald Kief, MD  valACYclovir (VALTREX) 500 MG tablet Take 500 mg by mouth at bedtime.    [provider]    Allergies    Penicillins, Sulfa  antibiotics, and Tomato  Review of Systems   Review of Systems  Unable to perform ROS: Mental status change  Gastrointestinal:  Positive for abdominal pain and constipation.   Physical Exam Updated Vital Signs BP 111/65   Pulse 93   Temp 98.7 F (37.1 C) (Oral)   Resp 18   SpO2 98%   Physical Exam Vitals and nursing note reviewed.  55 year old female, resting comfortably and in no acute distress. Vital signs are normal. Oxygen saturation is 98%, which is normal. Head is normocephalic and atraumatic. PERRLA, EOMI. Oropharynx is clear. Neck is nontender and supple without adenopathy or JVD. Back is nontender and there is no CVA tenderness. Lungs are clear without rales, wheezes, or rhonchi. Chest is nontender. Heart has regular rate and rhythm without murmur. Abdomen is soft, flat, nontender without masses or hepatosplenomegaly and peristalsis is normoactive. Extremities have no cyanosis or edema, full range of motion is present. Skin is warm and dry without rash. Neurologic: Awake and alert but slow to answer questions.  She is oriented to person and place but not time.  Cranial nerves are intact, there are no motor or sensory deficits.  ED Results / Procedures / Treatments   Labs (all labs ordered are listed, but only abnormal results are displayed) Labs Reviewed  CBC - Abnormal; Notable for the following components:      Result Value   Platelets 442 (*)    All other components within normal limits  SARS CORONAVIRUS 2 (TAT 6-24 HRS)  PROTIME-INR  APTT  DIFFERENTIAL  COMPREHENSIVE METABOLIC PANEL  LIPASE, BLOOD  URINALYSIS, ROUTINE W REFLEX MICROSCOPIC  I-STAT CHEM 8, ED  I-STAT BETA HCG BLOOD, ED ([facility], [facility], [facility] ONLY)  CBG MONITORING, ED    EKG EKG Interpretation  Date/Time:  Sunday January 23 2021 19:07:57 EDT Ventricular Rate:  112 PR Interval:  152 QRS Duration: 68 QT Interval:  326 QTC Calculation: 444 R Axis:   -15 Text Interpretation: Sinus tachycardia  Possible Anterior infarct , age undetermined Abnormal ECG When compared with ECG of 10/30/2020, No significant change was found Confirmed by Dione Booze ([id number]) on 01/23/2021 11:54:25 PM  Radiology CT HEAD WO CONTRAST  Result Date: 01/23/2021 CLINICAL DATA:  Neuro deficit EXAM: CT HEAD WITHOUT CONTRAST TECHNIQUE: Contiguous axial images were obtained from the base of the skull through the vertex without intravenous contrast. COMPARISON:  10/30/2020 FINDINGS: Brain: No evidence of acute infarction, hemorrhage, hydrocephalus, extra-axial collection or mass lesion/mass effect. Vascular: No hyperdense vessel or unexpected calcification. Skull: Normal. Negative for fracture or focal lesion. Sinuses/Orbits: Mucosal thickening, retention cyst, or polyp in the RIGHT maxillary sinus is stable. Other: None. IMPRESSION: No evidence for acute intracranial abnormality. Electronically Signed   By: Norva Pavlov M.D.   On: 01/23/2021 20:10  Procedures Procedures   Medications Ordered in ED Medications  sodium chloride flush (NS) 0.9 % injection 3 mL (has no administration in time range)    ED Course  I have reviewed the triage vital signs and the nursing notes.  Pertinent labs & imaging results that were available during my care of the patient were reviewed by me and considered in my medical decision making (see chart for details).   MDM Rules/Calculators/A&P                         Altered mental status of uncertain cause.  Old records reviewed confirming hospitalization 3 months ago for seizure.  Sister states that this presentation is actually different.  Work-up ordered from triage is unrevealing.  CT of head showed no acute process, CBC and comprehensive metabolic panel were normal except for mild thrombocytosis.  Urinalysis is pending will check chest x-ray.  Abdominal exam is benign, but given history of no bowel movement for 2 weeks, will check KUB to assess stool burden.  She is on several  psychoactive medications which may be contributing to her mental status change.  She will need hospital admission for further evaluation.  Chest x-ray and KUB were unremarkable.  Urinalysis is still pending.  She did have an episode of mild hypoglycemia which improved with intravenous dextrose.  Case is discussed with Dr. Loney Loh of [facility], who agrees to admit the patient.  Final Clinical Impression(s) / ED Diagnoses Final diagnoses:  Altered mental status, unspecified altered mental status type  Hypoglycemia  Constipation, unspecified constipation type    Rx / DC Orders ED Discharge Orders     None        Dione Booze, MD 01/24/21 [phone]

## 2021-01-24 NOTE — ED Notes (Signed)
Patient returned from EEG

## 2021-01-25 DIAGNOSIS — Z4659 Encounter for fitting and adjustment of other gastrointestinal appliance and device: Secondary | ICD-10-CM | POA: Diagnosis not present

## 2021-01-25 DIAGNOSIS — G9349 Other encephalopathy: Secondary | ICD-10-CM | POA: Diagnosis present

## 2021-01-25 DIAGNOSIS — E1165 Type 2 diabetes mellitus with hyperglycemia: Secondary | ICD-10-CM | POA: Diagnosis not present

## 2021-01-25 DIAGNOSIS — R600 Localized edema: Secondary | ICD-10-CM | POA: Diagnosis present

## 2021-01-25 DIAGNOSIS — E11649 Type 2 diabetes mellitus with hypoglycemia without coma: Secondary | ICD-10-CM | POA: Diagnosis present

## 2021-01-25 DIAGNOSIS — R4182 Altered mental status, unspecified: Secondary | ICD-10-CM | POA: Diagnosis present

## 2021-01-25 DIAGNOSIS — F061 Catatonic disorder due to known physiological condition: Secondary | ICD-10-CM | POA: Diagnosis present

## 2021-01-25 DIAGNOSIS — I10 Essential (primary) hypertension: Secondary | ICD-10-CM | POA: Diagnosis present

## 2021-01-25 DIAGNOSIS — I959 Hypotension, unspecified: Secondary | ICD-10-CM | POA: Diagnosis not present

## 2021-01-25 DIAGNOSIS — F819 Developmental disorder of scholastic skills, unspecified: Secondary | ICD-10-CM | POA: Diagnosis present

## 2021-01-25 DIAGNOSIS — E785 Hyperlipidemia, unspecified: Secondary | ICD-10-CM | POA: Diagnosis present

## 2021-01-25 DIAGNOSIS — R609 Edema, unspecified: Secondary | ICD-10-CM | POA: Diagnosis not present

## 2021-01-25 DIAGNOSIS — R630 Anorexia: Secondary | ICD-10-CM | POA: Diagnosis not present

## 2021-01-25 DIAGNOSIS — Y92009 Unspecified place in unspecified non-institutional (private) residence as the place of occurrence of the external cause: Secondary | ICD-10-CM | POA: Diagnosis not present

## 2021-01-25 DIAGNOSIS — E876 Hypokalemia: Secondary | ICD-10-CM | POA: Diagnosis present

## 2021-01-25 DIAGNOSIS — Z794 Long term (current) use of insulin: Secondary | ICD-10-CM | POA: Diagnosis not present

## 2021-01-25 DIAGNOSIS — G40909 Epilepsy, unspecified, not intractable, without status epilepticus: Secondary | ICD-10-CM | POA: Diagnosis present

## 2021-01-25 DIAGNOSIS — F319 Bipolar disorder, unspecified: Secondary | ICD-10-CM | POA: Diagnosis not present

## 2021-01-25 DIAGNOSIS — E669 Obesity, unspecified: Secondary | ICD-10-CM | POA: Diagnosis present

## 2021-01-25 DIAGNOSIS — Z20822 Contact with and (suspected) exposure to covid-19: Secondary | ICD-10-CM | POA: Diagnosis present

## 2021-01-25 DIAGNOSIS — F5089 Other specified eating disorder: Secondary | ICD-10-CM | POA: Diagnosis not present

## 2021-01-25 DIAGNOSIS — R4781 Slurred speech: Secondary | ICD-10-CM | POA: Diagnosis present

## 2021-01-25 DIAGNOSIS — I252 Old myocardial infarction: Secondary | ICD-10-CM | POA: Diagnosis not present

## 2021-01-25 DIAGNOSIS — E162 Hypoglycemia, unspecified: Secondary | ICD-10-CM | POA: Diagnosis present

## 2021-01-25 DIAGNOSIS — Z6830 Body mass index (BMI) 30.0-30.9, adult: Secondary | ICD-10-CM | POA: Diagnosis not present

## 2021-01-25 DIAGNOSIS — Z781 Physical restraint status: Secondary | ICD-10-CM | POA: Diagnosis not present

## 2021-01-25 DIAGNOSIS — K5649 Other impaction of intestine: Secondary | ICD-10-CM | POA: Diagnosis not present

## 2021-01-25 DIAGNOSIS — K5900 Constipation, unspecified: Secondary | ICD-10-CM | POA: Diagnosis not present

## 2021-01-25 DIAGNOSIS — G9340 Encephalopathy, unspecified: Secondary | ICD-10-CM | POA: Diagnosis not present

## 2021-01-25 DIAGNOSIS — T472X6A Underdosing of stimulant laxatives, initial encounter: Secondary | ICD-10-CM | POA: Diagnosis present

## 2021-01-25 DIAGNOSIS — E46 Unspecified protein-calorie malnutrition: Secondary | ICD-10-CM | POA: Diagnosis present

## 2021-01-25 DIAGNOSIS — R6250 Unspecified lack of expected normal physiological development in childhood: Secondary | ICD-10-CM | POA: Diagnosis present

## 2021-01-25 DIAGNOSIS — F79 Unspecified intellectual disabilities: Secondary | ICD-10-CM | POA: Diagnosis not present

## 2021-01-25 DIAGNOSIS — F315 Bipolar disorder, current episode depressed, severe, with psychotic features: Secondary | ICD-10-CM | POA: Diagnosis present

## 2021-01-25 LAB — CBC
HCT: 39.2 % (ref 36.0–46.0)
Hemoglobin: 13 g/dL (ref 12.0–15.0)
MCH: 30.2 pg (ref 26.0–34.0)
MCHC: 33.2 g/dL (ref 30.0–36.0)
MCV: 91.2 fL (ref 80.0–100.0)
Platelets: 370 10*3/uL (ref 150–400)
RBC: 4.3 MIL/uL (ref 3.87–5.11)
RDW: 12.3 % (ref 11.5–15.5)
WBC: 7.1 10*3/uL (ref 4.0–10.5)
nRBC: 0 % (ref 0.0–0.2)

## 2021-01-25 LAB — GLUCOSE, CAPILLARY
Glucose-Capillary: 129 mg/dL — ABNORMAL HIGH (ref 70–99)
Glucose-Capillary: 140 mg/dL — ABNORMAL HIGH (ref 70–99)
Glucose-Capillary: 145 mg/dL — ABNORMAL HIGH (ref 70–99)
Glucose-Capillary: 151 mg/dL — ABNORMAL HIGH (ref 70–99)
Glucose-Capillary: 157 mg/dL — ABNORMAL HIGH (ref 70–99)
Glucose-Capillary: 172 mg/dL — ABNORMAL HIGH (ref 70–99)

## 2021-01-25 LAB — MAGNESIUM: Magnesium: 1.8 mg/dL (ref 1.7–2.4)

## 2021-01-25 LAB — PROCALCITONIN: Procalcitonin: <0.1 ng/mL

## 2021-01-25 LAB — BASIC METABOLIC PANEL
Anion gap: 8 (ref 5–15)
BUN: 5 mg/dL — ABNORMAL LOW (ref 6–20)
CO2: 25 mmol/L (ref 22–32)
Calcium: 9.4 mg/dL (ref 8.9–10.3)
Chloride: 104 mmol/L (ref 98–111)
Creatinine, Ser: 0.7 mg/dL (ref 0.44–1.00)
GFR, Estimated: >60 mL/min (ref >60)
Glucose, Bld: 145 mg/dL — ABNORMAL HIGH (ref 70–99)
Potassium: 3.5 mmol/L (ref 3.5–5.1)
Sodium: 137 mmol/L (ref 135–145)

## 2021-01-25 LAB — SEDIMENTATION RATE: Sed Rate: 49 mm/hr — ABNORMAL HIGH (ref 0–22)

## 2021-01-25 LAB — C-REACTIVE PROTEIN: CRP: 1.9 mg/dL — ABNORMAL HIGH (ref <1.0)

## 2021-01-25 MED ORDER — RISPERIDONE 0.5 MG PO TABS
2.0000 mg | ORAL_TABLET | Freq: Every day | ORAL | Status: DC
  Filled 2021-01-25: qty 4

## 2021-01-25 MED ORDER — LEVETIRACETAM 500 MG PO TABS: 1000.0000 mg | ORAL_TABLET | Freq: Two times a day (BID) | ORAL | Status: DC

## 2021-01-25 MED ORDER — VALACYCLOVIR HCL 500 MG PO TABS
500.0000 mg | ORAL_TABLET | Freq: Every day | ORAL | Status: DC
  Filled 2021-01-25 (×4): qty 1

## 2021-01-25 MED ORDER — HALOPERIDOL LACTATE 5 MG/ML IJ SOLN
2.0000 mg | Freq: Four times a day (QID) | INTRAMUSCULAR | Status: DC | PRN
  Administered 2021-01-31 – 2021-02-10 (×2): 2 mg via INTRAMUSCULAR
  Filled 2021-01-25 (×2): qty 1

## 2021-01-25 MED ORDER — LEVETIRACETAM IN NACL 1000 MG/100ML IV SOLN
1000.0000 mg | Freq: Two times a day (BID) | INTRAVENOUS | Status: DC
  Administered 2021-01-25 – 2021-02-01 (×15): 1000 mg via INTRAVENOUS
  Filled 2021-01-25 (×15): qty 100

## 2021-01-25 NOTE — Plan of Care (Signed)

## 2021-01-25 NOTE — Plan of Care (Signed)

## 2021-01-25 NOTE — Progress Notes (Addendum)
PROGRESS NOTE    [name]  BUL:[id number] DOB: 12-Oct-1965 DOA: 01/23/2021 PCP: Iona Beard, MD   Brief Narrative:  55 year old with history of intellectual disability, bipolar disorder, seizure disorder, DM2, HTN, HLD, immunodeficiency disorder presented to the ED for change in mental status as reported by patient's sister for the past couple of days at home.  Upon admission CT head was negative.  Abdominal x-ray showed moderate stool burden and patient was confused.  She does have history of constipation and was placed on Linzess and Dulcolax by GI but patient has been noncompliant.  She does have scheduled colonoscopy as outpatient.  During the hospitalization her MRI brain, EEG and other neurologic work-up was negative.  She had very poor oral intake and lack of desire to eat and drink with flat affect therefore psychiatry team consulted.  Currently still remains on D5 fluid due to intermittent hypoglycemic episode.   Assessment & Plan:   Principal Problem:   Acute encephalopathy Active Problems:   Hypoglycemia   Constipation   Bipolar disorder (HCC)   Diabetes ([place])  Acute encephalopathy with flat affect -Unclear etiology, she is alert awake oriented X3 today.  No focal neurodeficits..  Ongoing for 2 days.  CT head is negative.  She does have history of seizures.  Chest x-ray-negative.  No obvious evidence of neurologic issues or underlying infection.  Low likelihood of subclinical seizures. - MRI brain-negative for acute pathology - EEG- WNL - UA- neg, UDS - neg.  Procalcitonin-negative.  Check ESR, CRP. - TSH, B12, ammonia - WNL - We will consult psychiatry  Moderate constipation -Aggressive bowel regimen, continue home Linzess.  Enema if necessary.  She did have a bowel movement yesterday after receiving enema.  History of seizure -Continue home Keppra 1000 mg twice daily, converted to IV due to poor oral intake - EEG - WNL  Bipolar disorder -Resume home medications  risperidone and trazodone.  We will consult psychiatry for their input due to very flat affect. Used to be on Depakote per her sister but stopped at somepoint last year.   Diabetes mellitus type 2 with hyperglycemia -Intermittent hypoglycemia, currently remains on D5 fluids.  Very poor oral intake.  Home diabetic regimen including Amaryl, metformin, Lantus and sliding scale on hold. -Continue regular diet.  D5 fluids - A1c 6.4.  Essential hypertension -Lasix, needs to be confirmed  Hyperlipidemia -Zocor, needs to be confirmed   DVT prophylaxis: Lovenox Code Status: Full Family Communication: We will speak with [name] today again.  Status is: Observation  The patient remains OBS appropriate and will d/c before 2 midnights.  Dispo: The patient is from: Home              Anticipated d/c is to: Home              Patient currently is not medically stable to d/c. On D5W, poor oral intake.  Psychiatry team consulted due to very poor oral intake and flat affect.   Difficult to place patient No      Subjective: During my visit she is alert awake oriented X3 but very flat affect.  Her breakfast is at bedside and, she has no desire to eat and drink at all.  She did not eat much yesterday either. She does not actively participate in conversation but when prompted she does engage in basic conversation.  Examination:  Constitutional: Not in acute distress Respiratory: Clear to auscultation bilaterally Cardiovascular: Normal sinus rhythm, no rubs Abdomen: Nontender nondistended good  bowel sounds Musculoskeletal: No edema noted Skin: No rashes seen Neurologic: CN 2-12 grossly intact.  And nonfocal Psychiatric: Poor judgment and insight.  Flat affect.  She is alert awake oriented X3.  Objective: Vitals:   01/24/21 2305 01/25/21 0324 01/25/21 0803 01/25/21 1210  BP: (!) 112/54 123/65 (!) 110/54 116/70  Pulse: 84 84 81 75  Resp: '18 20 18 18  ' Temp: 98.2 F (36.8 C) 97.9 F (36.6 C)  98.2 F (36.8 C) 98.1 F (36.7 C)  TempSrc: Oral Oral Oral Oral  SpO2: 99% 100% 100% 98%    Intake/Output Summary (Last 24 hours) at 01/25/2021 1303 Last data filed at 01/25/2021 0700 Gross per 24 hour  Intake 1107 ml  Output 1300 ml  Net -193 ml   There were no vitals filed for this visit.   Data Reviewed:   CBC: Recent Labs  Lab 01/23/21 1905 01/23/21 2004 01/25/21 0438  WBC 9.2  --  7.1  NEUTROABS 4.7  --   --   HGB 13.7 14.6 13.0  HCT 42.1 43.0 39.2  MCV 91.5  --  91.2  PLT 442*  --  263   Basic Metabolic Panel: Recent Labs  Lab 01/23/21 1905 01/23/21 2004 01/25/21 0438  NA 138 140 137  K 3.5 3.5 3.5  CL 102 102 104  CO2 23  --  25  GLUCOSE 81 81 145*  BUN 11 12 5*  CREATININE 0.96 0.80 0.70  CALCIUM 9.8  --  9.4  MG  --   --  1.8   GFR: CrCl cannot be calculated (Unknown ideal weight.). Liver Function Tests: Recent Labs  Lab 01/23/21 1905  AST 39  ALT 28  ALKPHOS 75  BILITOT 0.9  PROT 8.1  ALBUMIN 3.9   Recent Labs  Lab 01/23/21 1905  LIPASE 28   Recent Labs  Lab 01/24/21 0704  AMMONIA 24   Coagulation Profile: Recent Labs  Lab 01/23/21 1905  INR 0.9   Cardiac Enzymes: No results for input(s): CKTOTAL, CKMB, CKMBINDEX, TROPONINI in the last 168 hours. BNP (last 3 results) No results for input(s): PROBNP in the last 8760 hours. HbA1C: Recent Labs    01/23/21 1905  HGBA1C 6.4*   CBG: Recent Labs  Lab 01/24/21 1631 01/24/21 2014 01/25/21 0349 01/25/21 0806 01/25/21 1209  GLUCAP 127* 163* 145* 172* 151*   Lipid Profile: No results for input(s): CHOL, HDL, LDLCALC, TRIG, CHOLHDL, LDLDIRECT in the last 72 hours. Thyroid Function Tests: Recent Labs    01/24/21 0704  TSH 2.357   Anemia Panel: Recent Labs    01/24/21 0704  [id number]   Sepsis Labs: Recent Labs  Lab 01/25/21 1018  PROCALCITON <0.10    Recent Results (from the past 240 hour(s))  SARS CORONAVIRUS 2 (TAT 6-24 HRS) Nasopharyngeal  Nasopharyngeal Swab     Status: None   Collection Time: 01/24/21 12:23 AM   Specimen: Nasopharyngeal Swab  Result Value Ref Range Status   SARS Coronavirus 2 NEGATIVE NEGATIVE Final    Comment: (NOTE) SARS-CoV-2 target nucleic acids are NOT DETECTED.  The SARS-CoV-2 RNA is generally detectable in upper and lower respiratory specimens during the acute phase of infection. Negative results do not preclude SARS-CoV-2 infection, do not rule out co-infections with other pathogens, and should not be used as the sole basis for treatment or other patient management decisions. Negative results must be combined with clinical observations, patient history, and epidemiological information. The expected result is Negative.  Fact Sheet for Patients:  SugarRoll.be  Fact Sheet for Healthcare Providers: https://www.woods-mathews.com/  This test is not yet approved or cleared by the [place] FDA and  has been authorized for detection and/or diagnosis of SARS-CoV-2 by FDA under an Emergency Use Authorization (EUA). This EUA will remain  in effect (meaning this test can be used) for the duration of the COVID-19 declaration under Se ction 564(b)(1) of the Act, 21 U.S.C. section 360bbb-3(b)(1), unless the authorization is terminated or revoked sooner.  Performed at [facility], [place] [address], [place], [place] [address]          Radiology Studies: DG Abdomen 1 View  Result Date: 01/24/2021 CLINICAL DATA:  Constipation EXAM: ABDOMEN - 1 VIEW COMPARISON:  None. FINDINGS: Moderate stool burden within the colon. There is a non obstructive bowel gas pattern. No supine evidence of free air. No organomegaly or suspicious calcification. No acute bony abnormality. IMPRESSION: Moderate stool burden.  No acute findings. Electronically Signed   By: Rolm Baptise M.D.   On: 01/24/2021 00:46   CT HEAD WO CONTRAST  Result Date: 01/23/2021 CLINICAL DATA:  Neuro  deficit EXAM: CT HEAD WITHOUT CONTRAST TECHNIQUE: Contiguous axial images were obtained from the base of the skull through the vertex without intravenous contrast. COMPARISON:  10/30/2020 FINDINGS: Brain: No evidence of acute infarction, hemorrhage, hydrocephalus, extra-axial collection or mass lesion/mass effect. Vascular: No hyperdense vessel or unexpected calcification. Skull: Normal. Negative for fracture or focal lesion. Sinuses/Orbits: Mucosal thickening, retention cyst, or polyp in the RIGHT maxillary sinus is stable. Other: None. IMPRESSION: No evidence for acute intracranial abnormality. Electronically Signed   By: Nolon Nations M.D.   On: 01/23/2021 20:10   MR BRAIN WO CONTRAST  Result Date: 01/24/2021 CLINICAL DATA:  Delirium, 55 year old female with history of intellectual disability, bipolar disorder, seizure disorder EXAM: MRI HEAD WITHOUT CONTRAST TECHNIQUE: Multiplanar, multiecho pulse sequences of the brain and surrounding structures were obtained without intravenous contrast. COMPARISON:  CT head 01/23/2021, MR head 10/31/2020 FINDINGS: Brain: There is no acute intracranial hemorrhage, extra-axial fluid collection, or infarct. The ventricles are not enlarged. There is no midline shift. No mass lesion is identified Vascular: Normal flow voids. Skull and upper cervical spine: Normal marrow signal. Sinuses/Orbits: There is a small right maxillary sinus mucous retention cyst. The globes and orbits are unremarkable. Other: None. IMPRESSION: No acute intracranial pathology or epileptogenic focus identified. Electronically Signed   By: Valetta Mole MD   On: 01/24/2021 08:30   DG Chest Port 1 View  Result Date: 01/24/2021 CLINICAL DATA:  Constipation EXAM: PORTABLE CHEST 1 VIEW COMPARISON:  10/31/2020 FINDINGS: The heart size and mediastinal contours are within normal limits. Both lungs are clear. The visualized skeletal structures are unremarkable. IMPRESSION: No active disease. Electronically  Signed   By: Rolm Baptise M.D.   On: 01/24/2021 00:45   EEG adult  Result Date: 01/24/2021 Lora Havens, MD     01/24/2021 11:16 AM Patient Name: NAYANNA SEABORN MRN: [id number] Epilepsy Attending: Lora Havens Referring Physician/Provider: Dr Derrick Ravel Date: 01/24/2021 Duration: 23.56 mins Patient history: 55 year old female with altered mental status.  EEG to evaluate for seizures. Level of alertness: Awake AEDs during EEG study: None Technical aspects: This EEG study was done with scalp electrodes positioned according to the 10-20 International system of electrode placement. Electrical activity was acquired at a sampling rate of '500Hz'  and reviewed with a high frequency filter of '70Hz'  and a low frequency filter of '1Hz' . EEG data were recorded continuously  and digitally stored. Description: The posterior dominant rhythm consists of 9-10 Hz activity of moderate voltage (25-35 uV) seen predominantly in posterior head regions, symmetric and reactive to eye opening and eye closing. Hyperventilation and photic stimulation were not performed.   IMPRESSION: This study is within normal limits. No seizures or epileptiform discharges were seen throughout the recording. Priyanka Barbra Sarks        Scheduled Meds:  bisacodyl  10 mg Rectal Q1500   enoxaparin (LOVENOX) injection  40 mg Subcutaneous Q24H   polyethylene glycol  17 g Oral BID   risperiDONE  2 mg Oral QHS   sodium chloride flush  3 mL Intravenous Once   valACYclovir  500 mg Oral QHS   Continuous Infusions:  dextrose 75 mL/hr at 01/25/21 1208   levETIRAcetam 1,000 mg (01/25/21 1254)     LOS: 0 days   Time spent= 35 mins    Demontre Padin Arsenio Loader, MD [facility]  If 7PM-7AM, please contact night-coverage  01/25/2021, 1:03 PM

## 2021-01-26 DIAGNOSIS — R630 Anorexia: Secondary | ICD-10-CM | POA: Diagnosis not present

## 2021-01-26 DIAGNOSIS — F315 Bipolar disorder, current episode depressed, severe, with psychotic features: Secondary | ICD-10-CM | POA: Diagnosis not present

## 2021-01-26 DIAGNOSIS — R4182 Altered mental status, unspecified: Secondary | ICD-10-CM

## 2021-01-26 LAB — GLUCOSE, CAPILLARY
Glucose-Capillary: 152 mg/dL — ABNORMAL HIGH (ref 70–99)
Glucose-Capillary: 153 mg/dL — ABNORMAL HIGH (ref 70–99)
Glucose-Capillary: 156 mg/dL — ABNORMAL HIGH (ref 70–99)
Glucose-Capillary: 138 mg/dL — ABNORMAL HIGH (ref 70–99)
Glucose-Capillary: 135 mg/dL — ABNORMAL HIGH (ref 70–99)

## 2021-01-26 LAB — MAGNESIUM: Magnesium: 1.7 mg/dL (ref 1.7–2.4)

## 2021-01-26 LAB — CBC
HCT: 37.3 % (ref 36.0–46.0)
Hemoglobin: 12.3 g/dL (ref 12.0–15.0)
MCH: 29.6 pg (ref 26.0–34.0)
MCHC: 33 g/dL (ref 30.0–36.0)
MCV: 89.9 fL (ref 80.0–100.0)
Platelets: 360 10*3/uL (ref 150–400)
RBC: 4.15 MIL/uL (ref 3.87–5.11)
RDW: 12.3 % (ref 11.5–15.5)
WBC: 7.3 10*3/uL (ref 4.0–10.5)
nRBC: 0 % (ref 0.0–0.2)

## 2021-01-26 LAB — BASIC METABOLIC PANEL
Anion gap: 8 (ref 5–15)
BUN: <5 mg/dL — ABNORMAL LOW (ref 6–20)
CO2: 25 mmol/L (ref 22–32)
Calcium: 9.6 mg/dL (ref 8.9–10.3)
Chloride: 103 mmol/L (ref 98–111)
Creatinine, Ser: 0.77 mg/dL (ref 0.44–1.00)
GFR, Estimated: >60 mL/min (ref >60)
Glucose, Bld: 146 mg/dL — ABNORMAL HIGH (ref 70–99)
Potassium: 3.3 mmol/L — ABNORMAL LOW (ref 3.5–5.1)
Sodium: 136 mmol/L (ref 135–145)

## 2021-01-26 MED ORDER — QUETIAPINE FUMARATE 200 MG PO TABS
200.0000 mg | ORAL_TABLET | Freq: Every day | ORAL | Status: DC
  Filled 2021-01-26 (×3): qty 1

## 2021-01-26 MED ORDER — POTASSIUM CHLORIDE CRYS ER 20 MEQ PO TBCR
40.0000 meq | EXTENDED_RELEASE_TABLET | Freq: Once | ORAL | Status: DC
  Filled 2021-01-26: qty 2

## 2021-01-26 MED ORDER — MIRTAZAPINE 15 MG PO TABS
7.5000 mg | ORAL_TABLET | Freq: Every day | ORAL | Status: DC
  Filled 2021-01-26 (×3): qty 1

## 2021-01-26 NOTE — Consult Note (Signed)
[facility] Face-to-Face Psychiatry Consult   Reason for Consult:  Flat affect, refusal to eat, and medication management Referring Physician:  Stephania Fragmin, MD Patient Identification: CHRISTOL THETFORD MRN:  [id number] Principal Diagnosis: Acute encephalopathy Diagnosis:  Principal Problem:   Acute encephalopathy Active Problems:   Hypoglycemia   Constipation   Bipolar disorder (HCC)   Diabetes (HCC)   AMS (altered mental status)   Total Time spent with patient: 20 minutes  Subjective:   RAMSHA LONIGRO is a 55 y.o. female patient admitted for evaluation of acute mental status change and consulted to the psychiatry service for potential medication management in light of flat affect and refusal to eat. On assessment, patient says that she is feeling "bad and depressed" because of pain (would not show location of pain). She is a poor historian, only offering minimally worded answers to the questions asked with no spontaneous conversation. When asked about why she has been refusing to eat, she says she doesn't want to... no appetite, that has been ongoing for "a while." She falls asleep and refuses to answer follow-up questions, so the interview is limited. She denies SI, HI, and AH. She says she has VH but does not elaborate on questioning. She answers "mhmm" when asked if she stopped taking care of herself and stopped taking her medications at home, but refuses to answer further questioning.    HPI:  Shiri is a 55 year old female with a psychiatric history of bipolar disorder and IDD, and a past medical history of a seizure disorder, HLD, T2DM, and immunodeficiency disorder admitted for evaluation of mental status changes.  Past Psychiatric History:  Bipolar disorder, depressive disorder-unspecified (2006), IDD Inpatient admission- [facility] (November 2005), [facility] (2006 and 2009) Outpatient: [facility] during the Fall of 2005 and early 2006  Risk to Self:   Potentially Risk to Others: Unlikely Prior Inpatient Therapy:  Yes (see above) Prior Outpatient Therapy:  Yes (see above)  Past Medical History:  Past Medical History:  Diagnosis Date   Diabetes mellitus without complication (HCC)    Hypertension    Immune deficiency disorder (HCC)    Mild mental retardation    History reviewed. No pertinent surgical history. Family History:  Family History  Problem Relation Age of Onset   Hypertension Other    Family Psychiatric  History: Unknown Social History:  Social History   Substance and Sexual Activity  Alcohol Use No     Social History   Substance and Sexual Activity  Drug Use Never    Social History   Socioeconomic History   Marital status: Single    Spouse name: Not on file   Number of children: Not on file   Years of education: Not on file   Highest education level: Not on file  Occupational History   Not on file  Tobacco Use   Smoking status: Never   Smokeless tobacco: Never  Substance and Sexual Activity   Alcohol use: No   Drug use: Never   Sexual activity: Never  Other Topics Concern   Not on file  Social History Narrative   Not on file   Social Determinants of Health   Financial Resource Strain: Not on file  Food Insecurity: Not on file  Transportation Needs: Not on file  Physical Activity: Not on file  Stress: Not on file  Social Connections: Not on file   Additional Social History:    Allergies:   Allergies  Allergen Reactions  Penicillins    Sulfa Antibiotics    Tomato     Labs:  Results for orders placed or performed during the hospital encounter of 01/23/21 (from the past 48 hour(s))  CBG monitoring, ED     Status: Abnormal   Collection Time: 01/24/21  4:31 PM  Result Value Ref Range   Glucose-Capillary 127 (H) 70 - 99 mg/dL    Comment: Glucose reference range applies only to samples taken after fasting for at least 8 hours.  Glucose, capillary     Status: Abnormal   Collection  Time: 01/24/21  8:14 PM  Result Value Ref Range   Glucose-Capillary 163 (H) 70 - 99 mg/dL    Comment: Glucose reference range applies only to samples taken after fasting for at least 8 hours.  Glucose, capillary     Status: Abnormal   Collection Time: 01/25/21  3:49 AM  Result Value Ref Range   Glucose-Capillary 145 (H) 70 - 99 mg/dL    Comment: Glucose reference range applies only to samples taken after fasting for at least 8 hours.  Basic metabolic panel     Status: Abnormal   Collection Time: 01/25/21  4:38 AM  Result Value Ref Range   Sodium 137 135 - 145 mmol/L   Potassium 3.5 3.5 - 5.1 mmol/L   Chloride 104 98 - 111 mmol/L   CO2 25 22 - 32 mmol/L   Glucose, Bld 145 (H) 70 - 99 mg/dL    Comment: Glucose reference range applies only to samples taken after fasting for at least 8 hours.   BUN 5 (L) 6 - 20 mg/dL   Creatinine, Ser 8.78 0.44 - 1.00 mg/dL   Calcium 9.4 8.9 - 67.6 mg/dL   GFR, Estimated >72 >09 mL/min    Comment: (NOTE) Calculated using the CKD-EPI Creatinine Equation (2021)    Anion gap 8 5 - 15    Comment: Performed at [facility], [address] [place], [place] [address]  CBC     Status: None   Collection Time: 01/25/21  4:38 AM  Result Value Ref Range   WBC 7.1 4.0 - 10.5 K/uL   RBC 4.30 3.87 - 5.11 MIL/uL   Hemoglobin 13.0 12.0 - 15.0 g/dL   HCT 28.3 66.2 - 94.7 %   MCV 91.2 80.0 - 100.0 fL   MCH 30.2 26.0 - 34.0 pg   MCHC 33.2 30.0 - 36.0 g/dL   RDW 65.4 65.0 - 35.4 %   Platelets 370 150 - 400 K/uL   nRBC 0.0 0.0 - 0.2 %    Comment: Performed at [facility], [address], [place], [place] [address]  Magnesium     Status: None   Collection Time: 01/25/21  4:38 AM  Result Value Ref Range   Magnesium 1.8 1.7 - 2.4 mg/dL    Comment: Performed at [facility], [address], [place], [place] [address]  Glucose, capillary     Status: Abnormal   Collection Time: 01/25/21  8:06 AM  Result Value Ref Range   Glucose-Capillary 172 (H)  70 - 99 mg/dL    Comment: Glucose reference range applies only to samples taken after fasting for at least 8 hours.   Comment 1 Notify RN    Comment 2 Document in Chart   Procalcitonin - Baseline     Status: None   Collection Time: 01/25/21 10:18 AM  Result Value Ref Range   Procalcitonin <0.10 ng/mL  Comment:        Interpretation: PCT (Procalcitonin) <= 0.5 ng/mL: Systemic infection (sepsis) is not likely. Local bacterial infection is possible. (NOTE)       Sepsis PCT Algorithm           Lower Respiratory Tract                                      Infection PCT Algorithm    ----------------------------     ----------------------------         PCT < 0.25 ng/mL                PCT < 0.10 ng/mL          Strongly encourage             Strongly discourage   discontinuation of antibiotics    initiation of antibiotics    ----------------------------     -----------------------------       PCT 0.25 - 0.50 ng/mL            PCT 0.10 - 0.25 ng/mL               OR       >80% decrease in PCT            Discourage initiation of                                            antibiotics      Encourage discontinuation           of antibiotics    ----------------------------     -----------------------------         PCT >= 0.50 ng/mL              PCT 0.26 - 0.50 ng/mL               AND        <80% decrease in PCT             Encourage initiation of                                             antibiotics       Encourage continuation           of antibiotics    ----------------------------     -----------------------------        PCT >= 0.50 ng/mL                  PCT > 0.50 ng/mL               AND         increase in PCT                  Strongly encourage                                      initiation of antibiotics    Strongly encourage escalation           of antibiotics                                     -----------------------------  PCT <= 0.25  ng/mL                                                 OR                                        > 80% decrease in PCT                                      Discontinue / Do not initiate                                             antibiotics  Performed at [facility], [address] [place], [place] [address]   Sedimentation rate     Status: Abnormal   Collection Time: 01/25/21 10:18 AM  Result Value Ref Range   Sed Rate 49 (H) 0 - 22 mm/hr    Comment: Performed at [facility], [address] [place], [place] [address]  C-reactive protein     Status: Abnormal   Collection Time: 01/25/21 10:18 AM  Result Value Ref Range   CRP 1.9 (H) <1.0 mg/dL    Comment: Performed at [facility], [address] [place], [place] [address]  Glucose, capillary     Status: Abnormal   Collection Time: 01/25/21 12:09 PM  Result Value Ref Range   Glucose-Capillary 151 (H) 70 - 99 mg/dL    Comment: Glucose reference range applies only to samples taken after fasting for at least 8 hours.   Comment 1 Notify RN    Comment 2 Document in Chart   Glucose, capillary     Status: Abnormal   Collection Time: 01/25/21  3:44 PM  Result Value Ref Range   Glucose-Capillary 157 (H) 70 - 99 mg/dL    Comment: Glucose reference range applies only to samples taken after fasting for at least 8 hours.   Comment 1 Notify RN    Comment 2 Document in Chart   Glucose, capillary     Status: Abnormal   Collection Time: 01/25/21  9:24 PM  Result Value Ref Range   Glucose-Capillary 129 (H) 70 - 99 mg/dL    Comment: Glucose reference range applies only to samples taken after fasting for at least 8 hours.   Comment 1 Notify RN    Comment 2 Document in Chart   Glucose, capillary     Status: Abnormal   Collection Time: 01/25/21 11:58 PM  Result Value Ref Range   Glucose-Capillary 140 (H) 70 - 99 mg/dL    Comment: Glucose reference range applies only to samples taken after fasting for at least 8 hours.    Comment 1 Notify RN    Comment 2 Document in Chart   CBC     Status: None   Collection Time: 01/26/21  3:18 AM  Result Value Ref Range   WBC 7.3 4.0 - 10.5 K/uL   RBC 4.15 3.87 - 5.11 MIL/uL   Hemoglobin 12.3 12.0 - 15.0 g/dL   HCT  37.3 36.0 - 46.0 %   MCV 89.9 80.0 - 100.0 fL   MCH 29.6 26.0 - 34.0 pg   MCHC 33.0 30.0 - 36.0 g/dL   RDW 76.2 26.3 - 33.5 %   Platelets 360 150 - 400 K/uL   nRBC 0.0 0.0 - 0.2 %    Comment: Performed at [facility], [address] [place], [place] [address]  Magnesium     Status: None   Collection Time: 01/26/21  3:18 AM  Result Value Ref Range   Magnesium 1.7 1.7 - 2.4 mg/dL    Comment: Performed at [facility], [address], [place], [place] [address]  Glucose, capillary     Status: Abnormal   Collection Time: 01/26/21  3:54 AM  Result Value Ref Range   Glucose-Capillary 152 (H) 70 - 99 mg/dL    Comment: Glucose reference range applies only to samples taken after fasting for at least 8 hours.   Comment 1 Notify RN    Comment 2 Document in Chart   Basic metabolic panel     Status: Abnormal   Collection Time: 01/26/21  6:14 AM  Result Value Ref Range   Sodium 136 135 - 145 mmol/L   Potassium 3.3 (L) 3.5 - 5.1 mmol/L   Chloride 103 98 - 111 mmol/L   CO2 25 22 - 32 mmol/L   Glucose, Bld 146 (H) 70 - 99 mg/dL    Comment: Glucose reference range applies only to samples taken after fasting for at least 8 hours.   BUN <5 (L) 6 - 20 mg/dL   Creatinine, Ser 7.34 0.44 - 1.00 mg/dL   Calcium 9.6 8.9 - 28.7 mg/dL   GFR, Estimated >68 >11 mL/min    Comment: (NOTE) Calculated using the CKD-EPI Creatinine Equation (2021)    Anion gap 8 5 - 15    Comment: Performed at [facility], [address], [place], [place] [address]  Glucose, capillary     Status: Abnormal   Collection Time: 01/26/21  8:23 AM  Result Value Ref Range   Glucose-Capillary 153 (H) 70 - 99 mg/dL    Comment: Glucose reference range applies only to samples taken  after fasting for at least 8 hours.  Glucose, capillary     Status: Abnormal   Collection Time: 01/26/21 12:03 PM  Result Value Ref Range   Glucose-Capillary 156 (H) 70 - 99 mg/dL    Comment: Glucose reference range applies only to samples taken after fasting for at least 8 hours.    Current Facility-Administered Medications  Medication Dose Route Frequency Provider Last Rate Last Admin   acetaminophen (TYLENOL) tablet 650 mg  650 mg Oral Q6H PRN John Giovanni, MD       Or   acetaminophen (TYLENOL) suppository 650 mg  650 mg Rectal Q6H PRN John Giovanni, MD       bisacodyl (DULCOLAX) suppository 10 mg  10 mg Rectal Q1500 Amin, Ankit Chirag, MD   10 mg at 01/25/21 1830   dextromethorphan-guaiFENesin (MUCINEX DM) 30-600 MG per 12 hr tablet 1 tablet  1 tablet Oral BID PRN Amin, Ankit Chirag, MD       dextrose 5 % solution   Intravenous Continuous John Giovanni, MD 75 mL/hr at 01/26/21 0123 New Bag at 01/26/21 0123   enoxaparin (LOVENOX) injection 40 mg  40 mg Subcutaneous Q24H John Giovanni, MD   40 mg at 01/26/21 1059   haloperidol lactate (HALDOL) injection 2 mg  2 mg Intramuscular Q6H PRN Mansy, Jan A, MD       hydrALAZINE (APRESOLINE) injection 10 mg  10 mg Intravenous Q4H PRN Amin, Ankit Chirag, MD       ipratropium-albuterol (DUONEB) 0.5-2.5 (3) MG/3ML nebulizer solution 3 mL  3 mL Nebulization Q4H PRN Amin, Ankit Chirag, MD       levETIRAcetam (KEPPRA) IVPB 1000 mg/100 mL premix  1,000 mg Intravenous Q12H Amin, Ankit Chirag, MD 400 mL/hr at 01/26/21 1100 1,000 mg at 01/26/21 1100   mirtazapine (REMERON) tablet 7.5 mg  7.5 mg Oral QHS Lamar Sprinkles, MD       polyethylene glycol (MIRALAX / GLYCOLAX) packet 17 g  17 g Oral BID Amin, Ankit Chirag, MD   17 g at 01/26/21 1059   potassium chloride SA (KLOR-CON) CR tablet 40 mEq  40 mEq Oral Once Amin, Ankit Chirag, MD       QUEtiapine (SEROQUEL) tablet 200 mg  200 mg Oral QHS Andren Bethea, Toni Amend, MD       senna-docusate  (Senokot-S) tablet 1 tablet  1 tablet Oral QHS PRN Amin, Ankit Chirag, MD       sodium chloride flush (NS) 0.9 % injection 3 mL  3 mL Intravenous Once John Giovanni, MD       valACYclovir (VALTREX) tablet 500 mg  500 mg Oral QHS Amin, Loura Halt, MD        Musculoskeletal: Strength & Muscle Tone:  UTA Gait & Station:  UTA; patient lying in bed Patient leans: N/A   Psychiatric Specialty Exam:  Presentation  General Appearance: Appropriate for Environment  Eye Contact:Fleeting (Patient largely kept covers over her eyes.)  Speech:Other (comment) (Minimal word answers)  Speech Volume:Normal  Handedness: No data recorded  Mood and Affect  Mood:Irritable  Affect:Congruent   Thought Process  Thought Processes:-- (UTA; short answers, no spontaneous speech)  Descriptions of Associations:No data recorded Orientation:-- (Patient refuded to answer questions)  Thought Content:No data recorded History of Schizophrenia/Schizoaffective disorder:No data recorded Duration of Psychotic Symptoms:No data recorded Hallucinations:Hallucinations: Visual Description of Visual Hallucinations: Yes, but declines to describe them  Ideas of Reference:No data recorded Suicidal Thoughts:Suicidal Thoughts: No  Homicidal Thoughts:Homicidal Thoughts: No   Sensorium  Memory:Other (comment) (UTA)  Judgment:Other (comment) (UTA)  Insight:Lacking   Executive Functions  Concentration:Poor  Attention Span:Poor  Recall:Poor  Fund of Knowledge:-- (UTA; no spontaneous speech)  Language:-- (UTA)   Psychomotor Activity  Psychomotor Activity:Psychomotor Activity: Other (comment) (UTA)   Assets  Assets:Social Support   Sleep  Sleep:Sleep: Good   Physical Exam: Physical Exam Vitals reviewed.  HENT:     Head: Normocephalic and atraumatic.  Cardiovascular:     Rate and Rhythm: Normal rate.  Pulmonary:     Effort: Pulmonary effort is normal.  Psychiatric:        Mood and  Affect: Mood is depressed.   Review of Systems  Unable to perform ROS: Mental acuity  Psychiatric/Behavioral:  Positive for depression and hallucinations.   Able to obtain limited ROS. Blood pressure (!) 90/52, pulse 84, temperature 98.3 F (36.8 C), temperature source Oral, resp. rate 18, SpO2 98 %. There is no height or weight on file to calculate BMI.  Treatment Plan Summary: Rona is a 55 year old female with a history of bipolar disorder and IDD who was assessed by psychiatry for flat affect, poor PO intake, and medication management. On assessment, she endorses VH, acknowledges a decline in ADLs as well as medication non-compliance, and continues to refuse PO  intake due to reported lack of appetite. Patient does not cooperate well enough in interview to determine disposition, so she will be reassessed tomorrow.   #Poor PO intake #Bipolar disorder, current episode depressed -Discontinue Risperdal 2 mg qHS -Start Seroquel 200 mg QHS (Qtc 444) for bipolar depression -Start Remeron 7.5 mg qHS for appetite stimulation and depression   Disposition: Psychiatry will continue to follow and reassess when patient is more cooperative to determine appropriate disposition.  Lamar Sprinkles, MD 01/26/2021 3:02 PM

## 2021-01-26 NOTE — Progress Notes (Signed)
PROGRESS NOTE    [name]  IWL:[id number] DOB: July 04, 1965 DOA: 01/23/2021 PCP: Iona Beard, MD   Brief Narrative:  55 year old with history of intellectual disability, bipolar disorder, seizure disorder, DM2, HTN, HLD, immunodeficiency disorder presented to the ED for change in mental status as reported by patient's sister for the past couple of days at home.  Upon admission CT head was negative.  Abdominal x-ray showed moderate stool burden and patient was confused.  She does have history of constipation and was placed on Linzess and Dulcolax by GI but patient has been noncompliant.  She does have scheduled colonoscopy as outpatient.  During the hospitalization her MRI brain, EEG and other neurologic work-up was negative.  She had very poor oral intake and lack of desire to eat and drink with flat affect therefore psychiatry team consulted.  Currently still remains on D5 fluid due to intermittent hypoglycemic episode.   Assessment & Plan:   Principal Problem:   Acute encephalopathy Active Problems:   Hypoglycemia   Constipation   Bipolar disorder ([place])   Diabetes ([place])   AMS (altered mental status)  Acute encephalopathy with flat affect - Unclear etiology.  She is alert awake oriented X3.  No focal neurodeficit.  CT head, chest x-ray negative.  Does have history of seizure, could be subclinical seizure?   - MRI brain-negative for acute pathology - EEG- WNL - UA- neg, UDS - neg.  Procalcitonin-negative.  Check ESR, CRP. - TSH, B12, ammonia - WNL - Psychiatry consulted  Moderate constipation -Aggressive bowel regimen, continue home Linzess.  Enema if necessary.  She did have a bowel movement yesterday after receiving enema.  History of seizure -Continue home Keppra 1000 mg twice daily, converted to IV due to poor oral intake. Spoke with Dr Rory Percy, and per chart reivew she did have facial twitching and seziure type of even which was controlled with Keppra. At this time, if she  really needs to go back on Depakote (if psych recommends it, consider starting Depakote 1584m loading followed by 5044mbid, after loading dose Keppra can be stopped). If needed she can remain on both keppra and Depakote with outpatient follow up.  - EEG - WNL  Bipolar disorder -Resume home medications risperidone and trazodone. Family spoke with   Diabetes mellitus type 2 with hyperglycemia -Intermittent hypoglycemia, currently remains on D5 fluids.  Very poor oral intake.  Home diabetic regimen including Amaryl, metformin, Lantus and sliding scale on hold. - Still needs to remain on D5 fluid.  Encourage oral intake. - A1c 6.4.    DVT prophylaxis: Lovenox Code Status: Full Family Communication: [name] updated.  Status is: Inpatient.   Dispo: The patient is from: Home              Anticipated d/c is to: Home              Patient currently is not medically stable to d/c. On D5W, poor oral intake.  Psychiatry team consulted due to very poor oral intake and flat affect.   Difficult to place patient No   Subjective: During my visit, she was under her covers but would come out to briefly speak with me. She is Alert Awake Oriented x 3.   Examination:  Constitutional: Not in acute distress Respiratory: Clear to auscultation bilaterally Cardiovascular: Normal sinus rhythm, no rubs Abdomen: Nontender nondistended good bowel sounds Musculoskeletal: No edema noted Skin: No rashes seen Neurologic: CN 2-12 grossly intact.  And nonfocal Psychiatric: Normal judgment and insight.  Alert and oriented x 3. Flat affect.    Objective: Vitals:   01/25/21 1947 01/25/21 2357 01/26/21 0356 01/26/21 0826  BP: 96/69 121/73 (!) 108/59 (!) 102/50  Pulse: 76 82 80 76  Resp: '18  18 18  ' Temp: (!) 97.5 F (36.4 C) 98.7 F (37.1 C) 97.8 F (36.6 C) 98.4 F (36.9 C)  TempSrc: Oral Oral Oral Oral  SpO2: 97% 99% 100% 99%    Intake/Output Summary (Last 24 hours) at 01/26/2021 0957 Last data filed at  01/26/2021 3086 Gross per 24 hour  Intake 2238.37 ml  Output --  Net 2238.37 ml   There were no vitals filed for this visit.   Data Reviewed:   CBC: Recent Labs  Lab 01/23/21 1905 01/23/21 2004 01/25/21 0438 01/26/21 0318  WBC 9.2  --  7.1 7.3  NEUTROABS 4.7  --   --   --   HGB 13.7 14.6 13.0 12.3  HCT 42.1 43.0 39.2 37.3  MCV 91.5  --  91.2 89.9  PLT 442*  --  370 578   Basic Metabolic Panel: Recent Labs  Lab 01/23/21 1905 01/23/21 2004 01/25/21 0438 01/26/21 0318 01/26/21 0614  NA 138 140 137  --  136  K 3.5 3.5 3.5  --  3.3*  CL 102 102 104  --  103  CO2 23  --  25  --  25  GLUCOSE 81 81 145*  --  146*  BUN 11 12 5*  --  <5*  CREATININE 0.96 0.80 0.70  --  0.77  CALCIUM 9.8  --  9.4  --  9.6  MG  --   --  1.8 1.7  --    GFR: CrCl cannot be calculated (Unknown ideal weight.). Liver Function Tests: Recent Labs  Lab 01/23/21 1905  AST 39  ALT 28  ALKPHOS 75  BILITOT 0.9  PROT 8.1  ALBUMIN 3.9   Recent Labs  Lab 01/23/21 1905  LIPASE 28   Recent Labs  Lab 01/24/21 0704  AMMONIA 24   Coagulation Profile: Recent Labs  Lab 01/23/21 1905  INR 0.9   Cardiac Enzymes: No results for input(s): CKTOTAL, CKMB, CKMBINDEX, TROPONINI in the last 168 hours. BNP (last 3 results) No results for input(s): PROBNP in the last 8760 hours. HbA1C: Recent Labs    01/23/21 1905  HGBA1C 6.4*   CBG: Recent Labs  Lab 01/25/21 1544 01/25/21 2124 01/25/21 2358 01/26/21 0354 01/26/21 0823  GLUCAP 157* 129* 140* 152* 153*   Lipid Profile: No results for input(s): CHOL, HDL, LDLCALC, TRIG, CHOLHDL, LDLDIRECT in the last 72 hours. Thyroid Function Tests: Recent Labs    01/24/21 0704  TSH 2.357   Anemia Panel: Recent Labs    01/24/21 0704  [id number] 132   Sepsis Labs: Recent Labs  Lab 01/25/21 1018  PROCALCITON <0.10    Recent Results (from the past 240 hour(s))  SARS CORONAVIRUS 2 (TAT 6-24 HRS) Nasopharyngeal Nasopharyngeal Swab      Status: None   Collection Time: 01/24/21 12:23 AM   Specimen: Nasopharyngeal Swab  Result Value Ref Range Status   SARS Coronavirus 2 NEGATIVE NEGATIVE Final    Comment: (NOTE) SARS-CoV-2 target nucleic acids are NOT DETECTED.  The SARS-CoV-2 RNA is generally detectable in upper and lower respiratory specimens during the acute phase of infection. Negative results do not preclude SARS-CoV-2 infection, do not rule out co-infections with other pathogens, and should not be used as the sole basis for treatment or other  patient management decisions. Negative results must be combined with clinical observations, patient history, and epidemiological information. The expected result is Negative.  Fact Sheet for Patients: SugarRoll.be  Fact Sheet for Healthcare Providers: https://www.woods-mathews.com/  This test is not yet approved or cleared by the [place] FDA and  has been authorized for detection and/or diagnosis of SARS-CoV-2 by FDA under an Emergency Use Authorization (EUA). This EUA will remain  in effect (meaning this test can be used) for the duration of the COVID-19 declaration under Se ction 564(b)(1) of the Act, 21 U.S.C. section 360bbb-3(b)(1), unless the authorization is terminated or revoked sooner.  Performed at [facility], [place] [address], [place], [place] [address]       Radiology Studies: EEG adult  Result Date: 2021/02/16 Lora Havens, MD     2021/02/16 11:16 AM Patient Name: [name] MRN: [id number] Epilepsy Attending: Lora Havens Referring Physician/Provider: Dr Derrick Ravel Date: 16-Feb-2021 Duration: 23.56 mins Patient history: 55 year old female with altered mental status.  EEG to evaluate for seizures. Level of alertness: Awake AEDs during EEG study: None Technical aspects: This EEG study was done with scalp electrodes positioned according to the 10-20 International system of electrode  placement. Electrical activity was acquired at a sampling rate of '500Hz'  and reviewed with a high frequency filter of '70Hz'  and a low frequency filter of '1Hz' . EEG data were recorded continuously and digitally stored. Description: The posterior dominant rhythm consists of 9-10 Hz activity of moderate voltage (25-35 uV) seen predominantly in posterior head regions, symmetric and reactive to eye opening and eye closing. Hyperventilation and photic stimulation were not performed.   IMPRESSION: This study is within normal limits. No seizures or epileptiform discharges were seen throughout the recording. Priyanka Barbra Sarks     Scheduled Meds:  bisacodyl  10 mg Rectal Q1500   enoxaparin (LOVENOX) injection  40 mg Subcutaneous Q24H   polyethylene glycol  17 g Oral BID   potassium chloride  40 mEq Oral Once   risperiDONE  2 mg Oral QHS   sodium chloride flush  3 mL Intravenous Once   valACYclovir  500 mg Oral QHS   Continuous Infusions:  dextrose 75 mL/hr at 01/26/21 0123   levETIRAcetam 1,000 mg (01/25/21 2153)     LOS: 1 day   Time spent= 35 mins    Renita Brocks Arsenio Loader, MD [facility]  If 7PM-7AM, please contact night-coverage  01/26/2021, 9:57 AM

## 2021-01-26 NOTE — Final Progress Note (Signed)
Pt refuses all oral meds and food/fluids despite many offers.  MD aware.

## 2021-01-27 DIAGNOSIS — F315 Bipolar disorder, current episode depressed, severe, with psychotic features: Secondary | ICD-10-CM | POA: Diagnosis not present

## 2021-01-27 LAB — VALPROIC ACID LEVEL: Valproic Acid Lvl: <10 ug/mL — ABNORMAL LOW (ref 50.0–100.0)

## 2021-01-27 LAB — GLUCOSE, CAPILLARY
Glucose-Capillary: 151 mg/dL — ABNORMAL HIGH (ref 70–99)
Glucose-Capillary: 154 mg/dL — ABNORMAL HIGH (ref 70–99)
Glucose-Capillary: 166 mg/dL — ABNORMAL HIGH (ref 70–99)
Glucose-Capillary: 142 mg/dL — ABNORMAL HIGH (ref 70–99)
Glucose-Capillary: 120 mg/dL — ABNORMAL HIGH (ref 70–99)
Glucose-Capillary: 115 mg/dL — ABNORMAL HIGH (ref 70–99)

## 2021-01-27 LAB — CBC
HCT: 37.4 % (ref 36.0–46.0)
Hemoglobin: 12.5 g/dL (ref 12.0–15.0)
MCH: 30.2 pg (ref 26.0–34.0)
MCHC: 33.4 g/dL (ref 30.0–36.0)
MCV: 90.3 fL (ref 80.0–100.0)
Platelets: 338 10*3/uL (ref 150–400)
RBC: 4.14 MIL/uL (ref 3.87–5.11)
RDW: 12.2 % (ref 11.5–15.5)
WBC: 7.2 10*3/uL (ref 4.0–10.5)
nRBC: 0 % (ref 0.0–0.2)

## 2021-01-27 LAB — MAGNESIUM: Magnesium: 1.6 mg/dL — ABNORMAL LOW (ref 1.7–2.4)

## 2021-01-27 LAB — BASIC METABOLIC PANEL
Anion gap: 8 (ref 5–15)
BUN: <5 mg/dL — ABNORMAL LOW (ref 6–20)
CO2: 24 mmol/L (ref 22–32)
Calcium: 9.4 mg/dL (ref 8.9–10.3)
Chloride: 105 mmol/L (ref 98–111)
Creatinine, Ser: 0.76 mg/dL (ref 0.44–1.00)
GFR, Estimated: >60 mL/min (ref >60)
Glucose, Bld: 156 mg/dL — ABNORMAL HIGH (ref 70–99)
Potassium: 3.2 mmol/L — ABNORMAL LOW (ref 3.5–5.1)
Sodium: 137 mmol/L (ref 135–145)

## 2021-01-27 MED ORDER — ENSURE ENLIVE PO LIQD: 237.0000 mL | Freq: Two times a day (BID) | ORAL | Status: DC

## 2021-01-27 MED ORDER — MAGNESIUM SULFATE 2 GM/50ML IV SOLN
2.0000 g | Freq: Once | INTRAVENOUS | Status: AC
  Administered 2021-01-27: 2 g via INTRAVENOUS
  Filled 2021-01-27: qty 50

## 2021-01-27 MED ORDER — ADULT MULTIVITAMIN W/MINERALS CH
1.0000 | ORAL_TABLET | Freq: Every day | ORAL | Status: DC
  Filled 2021-01-27: qty 1

## 2021-01-27 MED ORDER — POTASSIUM CHLORIDE 10 MEQ/100ML IV SOLN
10.0000 meq | INTRAVENOUS | Status: AC
  Administered 2021-01-27 (×5): 10 meq via INTRAVENOUS
  Filled 2021-01-27 (×6): qty 100

## 2021-01-27 MED ORDER — POTASSIUM CHLORIDE CRYS ER 20 MEQ PO TBCR
40.0000 meq | EXTENDED_RELEASE_TABLET | Freq: Once | ORAL | Status: DC
  Filled 2021-01-27: qty 2

## 2021-01-27 MED ORDER — VALPROATE SODIUM 100 MG/ML IV SOLN
1500.0000 mg | Freq: Once | INTRAVENOUS | Status: AC
  Administered 2021-01-27: 1500 mg via INTRAVENOUS
  Filled 2021-01-27: qty 15

## 2021-01-27 NOTE — Consult Note (Signed)
[facility] Psych Consult Progress Note  01/27/2021 12:30 PM [name]  MRN:  [id number] Reason for Consult:  Flat affect, refusal to eat, and medication management Referring Physician:  Stephania Fragmin, MD Method of visit?: Face to Face   Principal Problem: Acute encephalopathy Diagnosis:  Principal Problem:   Acute encephalopathy Active Problems:   Hypoglycemia   Constipation   Bipolar disorder (HCC)   Diabetes (HCC)   AMS (altered mental status)   Poor appetite   Subjective: [name] is a 55 y.o. female patient admitted for evaluation of acute mental status change and consulted to the psychiatry service for potential medication management in light of flat affect and refusal to eat. On assessment, patient says "I don't want you" when asked how she is doing. She says things like "I can't eat because I don't have teeth OR don't have a body." She says she doesn't want to go back home because her "sister is not human, robotic." She declines to offer a reasoning as to why she does not want to take medications. There is still no spontaneous conversation, and she is playful today whereas she was more irritable yesterday.  Collateral: [name] [phone])- [name] is  normally hygienic, microwaves food, takes medications, dresses self, participates in family functions, and can hold a conversation. [name] confirms that [name] has IDD, which she describes: No sense of directions, memory issues, stares into space in confusion, someone can have a conversation with her but she won't be paying attention. During those episodes, she does not respond to internal stimuli. Over the last couple of days is when patient began to speak in a "baby-like" voice. She lost the desire to do things, began to say perseverates on "too little and too big." "No teeth, lips are too fat." Has refused to eat since Friday. No recent trauma.   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   Home Medications: Risperdal 2 mg qHS, Trazodone (unsure of dosage). Was taking Depakote ER 500 mg 2 pills qHS (2019)- Helpful   Total Time spent with patient: 20 minutes  Past Psychiatric History: Bipolar disorder, depressive disorder-unspecified (2006), IDD Inpatient admission- [facility] (November 2005), [facility] (2006 and 2009) Outpatient: [facility] during the Fall of 2005 and early 2006  Past Medical History:  Past Medical History:  Diagnosis Date   Diabetes mellitus without complication (HCC)    Hypertension    Immune deficiency disorder (HCC)    Mild mental retardation    History reviewed. No pertinent surgical history. Family History:  Family History  Problem Relation Age of Onset   Hypertension Other    Family Psychiatric  History: Unknown Social History:  Social History   Substance and Sexual Activity  Alcohol Use No     Social History   Substance and Sexual Activity  Drug Use Never    Social History   Socioeconomic History   Marital status: Single    Spouse name: Not on file   Number of children: Not on file   Years of education: Not on file   Highest education level: Not on file  Occupational History   Not on file  Tobacco Use   Smoking status: Never   Smokeless tobacco: Never  Substance and Sexual Activity   Alcohol use: No   Drug use: Never   Sexual activity: Never  Other Topics Concern   Not on file  Social History Narrative  Not on file   Social Determinants of Health   Financial Resource Strain: Not on file  Food Insecurity: Not on file  Transportation Needs: Not on file  Physical Activity: Not on file  Stress: Not on file  Social Connections: Not on file    Sleep: Fair  Appetite:  Poor, refuses all PO intake  Current Medications: Current Facility-Administered Medications  Medication Dose Route Frequency Provider Last Rate Last Admin   acetaminophen (TYLENOL) tablet 650 mg  650  mg Oral Q6H PRN John Giovanni, MD       Or   acetaminophen (TYLENOL) suppository 650 mg  650 mg Rectal Q6H PRN John Giovanni, MD       bisacodyl (DULCOLAX) suppository 10 mg  10 mg Rectal Q1500 Amin, Ankit Chirag, MD   10 mg at 01/25/21 1830   dextromethorphan-guaiFENesin (MUCINEX DM) 30-600 MG per 12 hr tablet 1 tablet  1 tablet Oral BID PRN Amin, Ankit Chirag, MD       enoxaparin (LOVENOX) injection 40 mg  40 mg Subcutaneous Q24H John Giovanni, MD   40 mg at 01/27/21 1058   haloperidol lactate (HALDOL) injection 2 mg  2 mg Intramuscular Q6H PRN Mansy, Jan A, MD       hydrALAZINE (APRESOLINE) injection 10 mg  10 mg Intravenous Q4H PRN Amin, Ankit Chirag, MD       ipratropium-albuterol (DUONEB) 0.5-2.5 (3) MG/3ML nebulizer solution 3 mL  3 mL Nebulization Q4H PRN Amin, Ankit Chirag, MD       levETIRAcetam (KEPPRA) IVPB 1000 mg/100 mL premix  1,000 mg Intravenous Q12H Amin, Ankit Chirag, MD   Stopped at 01/27/21 1120   mirtazapine (REMERON) tablet 7.5 mg  7.5 mg Oral QHS Lamar Sprinkles, MD       polyethylene glycol (MIRALAX / GLYCOLAX) packet 17 g  17 g Oral BID Amin, Ankit Chirag, MD   17 g at 01/26/21 1059   potassium chloride SA (KLOR-CON) CR tablet 40 mEq  40 mEq Oral Once Burnadette Pop, MD       QUEtiapine (SEROQUEL) tablet 200 mg  200 mg Oral QHS Lamar Sprinkles, MD       senna-docusate (Senokot-S) tablet 1 tablet  1 tablet Oral QHS PRN Amin, Ankit Chirag, MD       sodium chloride flush (NS) 0.9 % injection 3 mL  3 mL Intravenous Once John Giovanni, MD       valACYclovir (VALTREX) tablet 500 mg  500 mg Oral QHS Amin, Ankit Chirag, MD        Lab Results:  Results for orders placed or performed during the hospital encounter of 01/23/21 (from the past 48 hour(s))  Glucose, capillary     Status: Abnormal   Collection Time: 01/25/21  3:44 PM  Result Value Ref Range   Glucose-Capillary 157 (H) 70 - 99 mg/dL    Comment: Glucose reference range applies only to samples  taken after fasting for at least 8 hours.   Comment 1 Notify RN    Comment 2 Document in Chart   Glucose, capillary     Status: Abnormal   Collection Time: 01/25/21  9:24 PM  Result Value Ref Range   Glucose-Capillary 129 (H) 70 - 99 mg/dL    Comment: Glucose reference range applies only to samples taken after fasting for at least 8 hours.   Comment 1 Notify RN    Comment 2 Document in Chart   Glucose, capillary     Status: Abnormal   Collection Time:  01/25/21 11:58 PM  Result Value Ref Range   Glucose-Capillary 140 (H) 70 - 99 mg/dL    Comment: Glucose reference range applies only to samples taken after fasting for at least 8 hours.   Comment 1 Notify RN    Comment 2 Document in Chart   CBC     Status: None   Collection Time: 01/26/21  3:18 AM  Result Value Ref Range   WBC 7.3 4.0 - 10.5 K/uL   RBC 4.15 3.87 - 5.11 MIL/uL   Hemoglobin 12.3 12.0 - 15.0 g/dL   HCT 16.1 09.6 - 04.5 %   MCV 89.9 80.0 - 100.0 fL   MCH 29.6 26.0 - 34.0 pg   MCHC 33.0 30.0 - 36.0 g/dL   RDW 40.9 81.1 - 91.4 %   Platelets 360 150 - 400 K/uL   nRBC 0.0 0.0 - 0.2 %    Comment: Performed at [facility], [address], [place], [place] [address]  Magnesium     Status: None   Collection Time: 01/26/21  3:18 AM  Result Value Ref Range   Magnesium 1.7 1.7 - 2.4 mg/dL    Comment: Performed at [facility], [address], [place], [place] [address]  Glucose, capillary     Status: Abnormal   Collection Time: 01/26/21  3:54 AM  Result Value Ref Range   Glucose-Capillary 152 (H) 70 - 99 mg/dL    Comment: Glucose reference range applies only to samples taken after fasting for at least 8 hours.   Comment 1 Notify RN    Comment 2 Document in Chart   Basic metabolic panel     Status: Abnormal   Collection Time: 01/26/21  6:14 AM  Result Value Ref Range   Sodium 136 135 - 145 mmol/L   Potassium 3.3 (L) 3.5 - 5.1 mmol/L   Chloride 103 98 - 111 mmol/L   CO2 25 22 - 32 mmol/L   Glucose, Bld 146  (H) 70 - 99 mg/dL    Comment: Glucose reference range applies only to samples taken after fasting for at least 8 hours.   BUN <5 (L) 6 - 20 mg/dL   Creatinine, Ser 8.65 0.44 - 1.00 mg/dL   Calcium 9.6 8.9 - 78.4 mg/dL   GFR, Estimated >69 >62 mL/min    Comment: (NOTE) Calculated using the CKD-EPI Creatinine Equation (2021)    Anion gap 8 5 - 15    Comment: Performed at [facility], [address], [place], [place] [address]  Glucose, capillary     Status: Abnormal   Collection Time: 01/26/21  8:23 AM  Result Value Ref Range   Glucose-Capillary 153 (H) 70 - 99 mg/dL    Comment: Glucose reference range applies only to samples taken after fasting for at least 8 hours.  Glucose, capillary     Status: Abnormal   Collection Time: 01/26/21 12:03 PM  Result Value Ref Range   Glucose-Capillary 156 (H) 70 - 99 mg/dL    Comment: Glucose reference range applies only to samples taken after fasting for at least 8 hours.  Glucose, capillary     Status: Abnormal   Collection Time: 01/26/21  4:04 PM  Result Value Ref Range   Glucose-Capillary 138 (H) 70 - 99 mg/dL    Comment: Glucose reference range applies only to samples taken after fasting for at least 8 hours.  Glucose, capillary     Status: Abnormal   Collection Time: 01/26/21  9:22 PM  Result Value Ref Range   Glucose-Capillary 135 (H) 70 - 99 mg/dL    Comment: Glucose reference range applies only to samples taken after fasting for at least 8 hours.   Comment 1 Notify RN    Comment 2 Document in Chart   Glucose, capillary     Status: Abnormal   Collection Time: 01/27/21 12:30 AM  Result Value Ref Range   Glucose-Capillary 151 (H) 70 - 99 mg/dL    Comment: Glucose reference range applies only to samples taken after fasting for at least 8 hours.   Comment 1 Notify RN    Comment 2 Document in Chart   Basic metabolic panel     Status: Abnormal   Collection Time: 01/27/21  1:51 AM  Result Value Ref Range   Sodium 137 135 - 145  mmol/L   Potassium 3.2 (L) 3.5 - 5.1 mmol/L   Chloride 105 98 - 111 mmol/L   CO2 24 22 - 32 mmol/L   Glucose, Bld 156 (H) 70 - 99 mg/dL    Comment: Glucose reference range applies only to samples taken after fasting for at least 8 hours.   BUN <5 (L) 6 - 20 mg/dL   Creatinine, Ser 8.31 0.44 - 1.00 mg/dL   Calcium 9.4 8.9 - 51.7 mg/dL   GFR, Estimated >61 >60 mL/min    Comment: (NOTE) Calculated using the CKD-EPI Creatinine Equation (2021)    Anion gap 8 5 - 15    Comment: Performed at [facility], [address] [place], [place] [address]  CBC     Status: None   Collection Time: 01/27/21  1:51 AM  Result Value Ref Range   WBC 7.2 4.0 - 10.5 K/uL   RBC 4.14 3.87 - 5.11 MIL/uL   Hemoglobin 12.5 12.0 - 15.0 g/dL   HCT 62.6 94.8 - 54.6 %   MCV 90.3 80.0 - 100.0 fL   MCH 30.2 26.0 - 34.0 pg   MCHC 33.4 30.0 - 36.0 g/dL   RDW 27.0 35.0 - 09.3 %   Platelets 338 150 - 400 K/uL   nRBC 0.0 0.0 - 0.2 %    Comment: Performed at [facility], [address], [place], [place] [address]  Magnesium     Status: Abnormal   Collection Time: 01/27/21  1:51 AM  Result Value Ref Range   Magnesium 1.6 (L) 1.7 - 2.4 mg/dL    Comment: Performed at [facility], [address] [place], [place] [address]  Glucose, capillary     Status: Abnormal   Collection Time: 01/27/21  3:08 AM  Result Value Ref Range   Glucose-Capillary 154 (H) 70 - 99 mg/dL    Comment: Glucose reference range applies only to samples taken after fasting for at least 8 hours.   Comment 1 Notify RN    Comment 2 Document in Chart   Glucose, capillary     Status: Abnormal   Collection Time: 01/27/21  7:37 AM  Result Value Ref Range   Glucose-Capillary 166 (H) 70 - 99 mg/dL    Comment: Glucose reference range applies only to samples taken after fasting for at least 8 hours.  Glucose, capillary     Status: Abnormal   Collection Time: 01/27/21 12:02 PM  Result Value Ref Range   Glucose-Capillary 142 (H) 70 -  99 mg/dL    Comment: Glucose reference range applies only to samples taken after fasting for at least 8  hours.    Blood Alcohol level:  Lab Results  Component Value Date   ETH <10 10/28/2020   [facility]  [facility]    <5        LOWEST DETECTABLE LIMIT FOR SERUM ALCOHOL IS 11 mg/dL FOR MEDICAL PURPOSES ONLY    Physical Findings:  Musculoskeletal: Strength & Muscle Tone: within normal limits Gait & Station:  Patient in bed; unobserved Patient leans: N/A  Psychiatric Specialty Exam:  Presentation  General Appearance: Appropriate for Environment  Eye Contact:Minimal  Speech:Clear and Coherent  Speech Volume:Normal  Handedness: No data recorded  Mood and Affect  Mood:Irritable  Affect:Congruent   Thought Process  Thought Processes:Goal Directed  Descriptions of Associations:No data recorded Orientation:Partial  Thought Content:No data recorded History of Schizophrenia/Schizoaffective disorder:No data recorded Duration of Psychotic Symptoms:No data recorded Hallucinations:Hallucinations: None Description of Visual Hallucinations: Yes, but declines to describe them  Ideas of Reference:None  Suicidal Thoughts:Suicidal Thoughts: No  Homicidal Thoughts:Homicidal Thoughts: No   Sensorium  Memory:-- (Unable to assess (UTA))  Judgment:Poor  Insight:Lacking   Executive Functions  Concentration:Poor  Attention Span:Poor  Recall:Poor  Fund of Knowledge:Poor  Language:Poor   Psychomotor Activity  Psychomotor Activity:Psychomotor Activity: Normal   Assets  Assets:Social Support   Sleep  Sleep:Sleep: Good    Physical Exam: Physical Exam Vitals reviewed.  HENT:     Head: Normocephalic and atraumatic.  Eyes:     Extraocular Movements: Extraocular movements intact.  Cardiovascular:     Rate and Rhythm: Normal rate.  Pulmonary:     Effort: Pulmonary effort is normal.  Neurological:     Mental Status: She is alert and oriented to person,  place, and time.  Psychiatric:        Attention and Perception: Attention normal. She does not perceive auditory or visual hallucinations.        Mood and Affect: Affect is blunt and inappropriate.        Behavior: Behavior is uncooperative.        Thought Content: Thought content is delusional. Thought content is not paranoid. Thought content does not include homicidal or suicidal ideation.        Cognition and Memory: Cognition is impaired.        Judgment: Judgment is inappropriate.     Comments: Non-spontaneous speech. Very child-like responses to questions, as though she is joking.    Review of Systems  Unable to perform ROS: Psychiatric disorder  Psychiatric/Behavioral:  Negative for depression, hallucinations, memory loss, substance abuse and suicidal ideas. The patient is not nervous/anxious and does not have insomnia.   Able to obtain limited ROS; patient largely uncooperative. Blood pressure (!) 94/52, pulse 81, temperature 98.1 F (36.7 C), temperature source Oral, resp. rate 17, SpO2 97 %. There is no height or weight on file to calculate BMI.  Treatment Plan Summary: Park MeoCeleste remains inappropriate and immature in responses and still refuses all PO intake. Her sister describes her current presentation being completely different from her usual. At this time, there is still no clear etiology of her presentation.  Ddx: Acute Psychosis vs. Bipolar disorder with psychotic features vs. MDD- recurrent/severe/with psychotic features vs. IDD symptomology -Ordered RPR, HIV, and Depakote level -Ordered Seroquel 200 mg qHS and Remeron 7.5 mg qHS but patient refusing PO. -Since her sister stated she previously did well on Depakote, ordered loading dose of 1500mg  IV. Tomorrow, will begin 500mg  IV bid.  Disposition: Psychiatry will continue to follow.  Lamar Sprinklesourtney Legend Pecore, MD 01/27/2021, 12:30 PM

## 2021-01-27 NOTE — Progress Notes (Signed)
Pt was alert oriented during shift but pt refused all her po medications during shift, pt also refused anything po either to drink or eat during shift. Pt slept comfortably during shift and reported off to oncoming RN. Dionne Bucy RN

## 2021-01-27 NOTE — Progress Notes (Signed)
PROGRESS NOTE    [name]  ZSW:[id number] DOB: 01/17/1966 DOA: 01/23/2021 PCP: Mirna Mires, MD   Chief Complain: Altered mental status  Brief Narrative: Patient is a 55 year old female with history of intellectual disability, bipolar disorder, seizure disorder, diabetes type 2, hypertension, hyperlipidemia, immunodeficiency disorder who presented to the emergency department from home for evaluation of altered mental status.  CT head was negative on admission.  Abdominal x-ray showed moderate stool burden and patient was confused.  MRI of the brain, EEG and other neurological work-up was negative.  She continues to have poor oral intake, lack of desire to eat/drink with flat affect.  Psychiatry consulted.  Started on Remeron Seroquel.  Waiting for final psychiatric recommendation before discharge planning.  Assessment & Plan:   Principal Problem:   Acute encephalopathy Active Problems:   Hypoglycemia   Constipation   Bipolar disorder (HCC)   Diabetes (HCC)   AMS (altered mental status)   Poor appetite   Acute encephalopathy/flat affect: Unclear etiology.  No focal neurological deficits.  Brain imagings negative for acute intracranial abnormalities.  EEG did not show any seizures.  UA negative, UDS negative.  Procalcitonin negative ruling out infectious process.  Vitamin B12, ammonia, TSH normal.  Psychiatry consulted and following.  Currently on Remeron, Seroquel.  She still is not coherent and did not participate with any meaningful conversation.  Psychiatry recommending to follow-up daily before final recommendation.  History of seizure: Continue Keppra 1 g twice daily, converted to IV due to poor oral intake.  EEG did not show any seizure or epileptiform discharge.  Neurology was following.  Bipolar disorder: On risperidone, trazodone at home.  Diabetes /poor oral intake: Intermittently hypoglycemic. Was on D5, now discontinued because blood sugars are stable.  Nutrition  consulted.  Very poor oral intake.  Takes Amaryl, metformin, Lantus, sliding scale at home.  Hemoglobin A1c of 6.4.  Hypotension: Blood pressure soft.  Continue to monitor.  Hypokalemia/hypomagnesemia: Supplemented with potassium/magnesium and being monitored  Nutrition Problem: Inadequate oral intake Etiology: lethargy/confusion      DVT prophylaxis:Lovenox Code Status: Full Family Communication: None at bedside Status is: Inpatient  Remains inpatient appropriate because:Unsafe d/c plan  Dispo: The patient is from: Home              Anticipated d/c is to: Home              Patient currently is not medically stable to d/c.   Difficult to place patient No    Consultants: Psychiatry, neurology  Procedures: None  Antimicrobials:  Anti-infectives (From admission, onward)    Start     Dose/Rate Route Frequency Ordered Stop   01/25/21 2200  valACYclovir (VALTREX) tablet 500 mg        500 mg Oral Daily at bedtime 01/25/21 0930         Subjective: Patient seen and examined at the bedside this morning.  She looked comfortable, looked at me when I call her name but did not participate with meaningful conversation.  Not in any kind of distress.  Objective: Vitals:   01/27/21 0034 01/27/21 0312 01/27/21 0736 01/27/21 1201  BP: 131/61 95/60 101/61 (!) 94/52  Pulse: 75 76 83 81  Resp: 18 19 17 17   Temp: 98.3 F (36.8 C) 97.9 F (36.6 C) 98 F (36.7 C) 98.1 F (36.7 C)  TempSrc:   Oral Oral  SpO2: 99% 97% 98% 97%    Intake/Output Summary (Last 24 hours) at 01/27/2021 1411 Last data filed  at 01/27/2021 0631 Gross per 24 hour  Intake 1798.59 ml  Output 2000 ml  Net -201.41 ml   There were no vitals filed for this visit.  Examination:  General exam: Overall comfortable, not in distress,obese HEENT: PERRL Respiratory system:  no wheezes or crackles  Cardiovascular system: S1 & S2 heard, RRR.  Gastrointestinal system: Abdomen is nondistended, soft and  nontender. Central nervous system: Alert and awake,flat affect Extremities: No edema, no clubbing ,no cyanosis Skin: No rashes, no ulcers,no icterus      Data Reviewed: I have personally reviewed following labs and imaging studies  CBC: Recent Labs  Lab 01/23/21 1905 01/23/21 2004 01/25/21 0438 01/26/21 0318 01/27/21 0151  WBC 9.2  --  7.1 7.3 7.2  NEUTROABS 4.7  --   --   --   --   HGB 13.7 14.6 13.0 12.3 12.5  HCT 42.1 43.0 39.2 37.3 37.4  MCV 91.5  --  91.2 89.9 90.3  PLT 442*  --  370 360 338   Basic Metabolic Panel: Recent Labs  Lab 01/23/21 1905 01/23/21 2004 01/25/21 0438 01/26/21 0318 01/26/21 0614 01/27/21 0151  NA 138 140 137  --  136 137  K 3.5 3.5 3.5  --  3.3* 3.2*  CL 102 102 104  --  103 105  CO2 23  --  25  --  25 24  GLUCOSE 81 81 145*  --  146* 156*  BUN 11 12 5*  --  <5* <5*  CREATININE 0.96 0.80 0.70  --  0.77 0.76  CALCIUM 9.8  --  9.4  --  9.6 9.4  MG  --   --  1.8 1.7  --  1.6*   GFR: CrCl cannot be calculated (Unknown ideal weight.). Liver Function Tests: Recent Labs  Lab 01/23/21 1905  AST 39  ALT 28  ALKPHOS 75  BILITOT 0.9  PROT 8.1  ALBUMIN 3.9   Recent Labs  Lab 01/23/21 1905  LIPASE 28   Recent Labs  Lab 01/24/21 0704  AMMONIA 24   Coagulation Profile: Recent Labs  Lab 01/23/21 1905  INR 0.9   Cardiac Enzymes: No results for input(s): CKTOTAL, CKMB, CKMBINDEX, TROPONINI in the last 168 hours. BNP (last 3 results) No results for input(s): PROBNP in the last 8760 hours. HbA1C: No results for input(s): HGBA1C in the last 72 hours. CBG: Recent Labs  Lab 01/26/21 2122 01/27/21 0030 01/27/21 0308 01/27/21 0737 01/27/21 1202  GLUCAP 135* 151* 154* 166* 142*   Lipid Profile: No results for input(s): CHOL, HDL, LDLCALC, TRIG, CHOLHDL, LDLDIRECT in the last 72 hours. Thyroid Function Tests: No results for input(s): TSH, T4TOTAL, FREET4, T3FREE, THYROIDAB in the last 72 hours. Anemia Panel: No results for  input(s): VITAMINB12, FOLATE, FERRITIN, TIBC, IRON, RETICCTPCT in the last 72 hours. Sepsis Labs: Recent Labs  Lab 01/25/21 1018  PROCALCITON <0.10    Recent Results (from the past 240 hour(s))  SARS CORONAVIRUS 2 (TAT 6-24 HRS) Nasopharyngeal Nasopharyngeal Swab     Status: None   Collection Time: 01/24/21 12:23 AM   Specimen: Nasopharyngeal Swab  Result Value Ref Range Status   SARS Coronavirus 2 NEGATIVE NEGATIVE Final    Comment: (NOTE) SARS-CoV-2 target nucleic acids are NOT DETECTED.  The SARS-CoV-2 RNA is generally detectable in upper and lower respiratory specimens during the acute phase of infection. Negative results do not preclude SARS-CoV-2 infection, do not rule out co-infections with other pathogens, and should not be used as the sole  basis for treatment or other patient management decisions. Negative results must be combined with clinical observations, patient history, and epidemiological information. The expected result is Negative.  Fact Sheet for Patients: HairSlick.no  Fact Sheet for Healthcare Providers: quierodirigir.com  This test is not yet approved or cleared by the [place] FDA and  has been authorized for detection and/or diagnosis of SARS-CoV-2 by FDA under an Emergency Use Authorization (EUA). This EUA will remain  in effect (meaning this test can be used) for the duration of the COVID-19 declaration under Se ction 564(b)(1) of the Act, 21 U.S.C. section 360bbb-3(b)(1), unless the authorization is terminated or revoked sooner.  Performed at [facility], [address], [place], [place] [address]          Radiology Studies: No results found.      Scheduled Meds:  bisacodyl  10 mg Rectal Q1500   enoxaparin (LOVENOX) injection  40 mg Subcutaneous Q24H   mirtazapine  7.5 mg Oral QHS   polyethylene glycol  17 g Oral BID   potassium chloride  40 mEq Oral Once    QUEtiapine  200 mg Oral QHS   sodium chloride flush  3 mL Intravenous Once   valACYclovir  500 mg Oral QHS   Continuous Infusions:  levETIRAcetam Stopped (01/27/21 1120)     LOS: 2 days    Time spent: 25 mins,More than 50% of that time was spent in counseling and/or coordination of care.      Burnadette Pop, MD [facility] P8/04/2021, 2:11 PM

## 2021-01-27 NOTE — Progress Notes (Signed)
Refused MVT and ensure.  Stated "I want to stay in the hospital."

## 2021-01-27 NOTE — Progress Notes (Signed)
In to give patient meds and bath/linen change.  Pt is alert and oriented.  More interactive today, but still refusing all oral meds, meals/nutrition.  Pt incontinent and had removed purewick.  When giving bath, pt screaming "you're raping me" then laughing.  Explained she was getting a bath, and offered support.

## 2021-01-27 NOTE — Progress Notes (Signed)
Pt has been less irritable today, often laughing inappropriately, and joking with staff, however, she also yells out "you broke my arm" in a baby like voice. Continues to refuse all oral intake including, meds.  Refused oral care when offered x 2 today stating "I don't have any teeth."

## 2021-01-27 NOTE — Progress Notes (Signed)
Initial Nutrition Assessment  DOCUMENTATION CODES:  Not applicable  INTERVENTION:  -Please obtain updated weight -Ensure Enlive po BID, each supplement provides 350 kcal and 20 grams of protein -MVI with minerals daily  If pt does not begin to accept POs, recommend placement of Cortrak tomorrow, 01/28/21, for initiation of nutrition support if aligned with GOC. Consider: -Osmolite 1.2 @ 36ml/hr, advance by 6ml/hr Q8H until goal rate of 27ml/hr (1563ml/d) is reached -34ml Prosource TF daily - free water Q4H  At goal, TF would provide 1912 kcals, 97 grams protein, free water ( total free water)  Patient will be at risk for refeeding syndrome once feeding is initiated. Monitor magnesium, potassium, and phosphorus daily for at least 3 days, MD to replete as needed.  NUTRITION DIAGNOSIS:  Inadequate oral intake related to lethargy/confusion as evidenced by meal completion < 25%, per patient/family report.  GOAL:  Patient will meet greater than or equal to 90% of their needs  MONITOR:  PO intake, Supplement acceptance, I & O's  REASON FOR ASSESSMENT:  Consult Assessment of nutrition requirement/status  ASSESSMENT:  Pt with PMH significant of intellectual disability, bipolar disorder, seizure disorder, type 2 DM, HTN, HLD, immunodeficiency disorder presented to the ED for change in mental status as reported by patient's sister for the past couple of days at home. CT head, MRI brain, EEG, and other neurologic work-up have been negative. Abdominal x-ray showed moderate stool burden. Pt does have h/o constipation and was recommended by GI to take Linzess and Dulcolax, though pt has been noncompliant. Pt has outpatient colonoscopy scheduled.  Pt unavailable at time of RD visit, receiving bath/linen change. Per RN, pt is more alert & interactive today but with inappropriate responses (see RN note from 01/27/21 1050). RN also reports pt refusing all oral meds and  meals/nutrition. Psychiatry assessed pt due to flat affect and refusal to eat/drink; initiated seroquel and remeron. Pt has been on D5 due to intermittent hypoglycemic episodes.   Need new weight measured as no weight obtained for this admission.   PO intake: 0% x1 recorded meal  Medications: dulcolax, remeron, miralax, klor-con Labs: K+ 3.2 (L), Mg 1.6 (L) CBGs [phone]  UOP: 2L x24 hours I/O: +1.6L since admit  Diet Order:   Diet Order             Diet regular Room service appropriate? Yes; Fluid consistency: Thin  Diet effective now                  EDUCATION NEEDS:  Not appropriate for education at this time  Skin:  Skin Assessment: Reviewed RN Assessment  Last BM:  8/10  Height:  Ht Readings from Last 1 Encounters:  10/30/20 5\' 1"  (1.549 m)   Weight:  Wt Readings from Last 1 Encounters:  10/30/20 83 kg   BMI:  There is no height or weight on file to calculate BMI.  Estimated Nutritional Needs:  Kcal:  1800-2000 Protein:  90-100 grams Fluid:  >1.8L/d    11/01/20, MS, RD, LDN (she/her/hers) RD pager number and weekend/on-call pager number located in Amion.

## 2021-01-28 DIAGNOSIS — R630 Anorexia: Secondary | ICD-10-CM | POA: Diagnosis not present

## 2021-01-28 DIAGNOSIS — R4182 Altered mental status, unspecified: Secondary | ICD-10-CM | POA: Diagnosis not present

## 2021-01-28 LAB — GLUCOSE, CAPILLARY
Glucose-Capillary: 101 mg/dL — ABNORMAL HIGH (ref 70–99)
Glucose-Capillary: 103 mg/dL — ABNORMAL HIGH (ref 70–99)
Glucose-Capillary: 109 mg/dL — ABNORMAL HIGH (ref 70–99)
Glucose-Capillary: 109 mg/dL — ABNORMAL HIGH (ref 70–99)
Glucose-Capillary: 126 mg/dL — ABNORMAL HIGH (ref 70–99)
Glucose-Capillary: 173 mg/dL — ABNORMAL HIGH (ref 70–99)
Glucose-Capillary: 55 mg/dL — ABNORMAL LOW (ref 70–99)
Glucose-Capillary: 95 mg/dL (ref 70–99)

## 2021-01-28 LAB — BASIC METABOLIC PANEL
Anion gap: 10 (ref 5–15)
BUN: <5 mg/dL — ABNORMAL LOW (ref 6–20)
CO2: 25 mmol/L (ref 22–32)
Calcium: 9.5 mg/dL (ref 8.9–10.3)
Chloride: 103 mmol/L (ref 98–111)
Creatinine, Ser: 0.85 mg/dL (ref 0.44–1.00)
GFR, Estimated: >60 mL/min (ref >60)
Glucose, Bld: 87 mg/dL (ref 70–99)
Potassium: 3.9 mmol/L (ref 3.5–5.1)
Sodium: 138 mmol/L (ref 135–145)

## 2021-01-28 LAB — RPR: RPR Ser Ql: NONREACTIVE

## 2021-01-28 LAB — HIV ANTIBODY (ROUTINE TESTING W REFLEX): HIV Screen 4th Generation wRfx: NONREACTIVE

## 2021-01-28 LAB — PHOSPHORUS: Phosphorus: 3.3 mg/dL (ref 2.5–4.6)

## 2021-01-28 LAB — MAGNESIUM: Magnesium: 2.2 mg/dL (ref 1.7–2.4)

## 2021-01-28 MED ORDER — HALOPERIDOL LACTATE 5 MG/ML IJ SOLN
2.0000 mg | Freq: Two times a day (BID) | INTRAMUSCULAR | Status: DC
  Administered 2021-01-28 – 2021-01-31 (×7): 2 mg via INTRAVENOUS
  Filled 2021-01-28 (×7): qty 1

## 2021-01-28 MED ORDER — VALPROATE SODIUM 100 MG/ML IV SOLN
500.0000 mg | Freq: Two times a day (BID) | INTRAVENOUS | Status: DC
  Administered 2021-01-28: 500 mg via INTRAVENOUS
  Filled 2021-01-28 (×2): qty 5

## 2021-01-28 MED ORDER — OSMOLITE 1.2 CAL PO LIQD
1000.0000 mL | ORAL | Status: DC
  Administered 2021-01-31 – 2021-02-04 (×5): 1000 mL

## 2021-01-28 MED ORDER — PROSOURCE TF PO LIQD
45.0000 mL | Freq: Every day | ORAL | Status: DC
  Administered 2021-02-01 – 2021-02-04 (×4): 45 mL
  Filled 2021-01-28 (×4): qty 45

## 2021-01-28 MED ORDER — FREE WATER
100.0000 mL | Status: DC
  Administered 2021-01-31 – 2021-02-23 (×130): 100 mL

## 2021-01-28 MED ORDER — DEXTROSE 5 % IV SOLN: INTRAVENOUS | Status: DC

## 2021-01-28 MED ORDER — DEXTROSE 50 % IV SOLN
INTRAVENOUS | Status: AC
  Administered 2021-01-28: 50 mL
  Filled 2021-01-28: qty 50

## 2021-01-28 NOTE — Progress Notes (Addendum)
PROGRESS NOTE    [name]  GGE:[id number] DOB: October 12, 1965 DOA: 01/23/2021 PCP: Mirna Mires, MD   Chief Complain: Altered mental status  Brief Narrative: Patient is a 55 year old female with history of intellectual disability, bipolar disorder, seizure disorder, diabetes type 2, hypertension, hyperlipidemia, immunodeficiency disorder who presented to the emergency department from home for evaluation of altered mental status.  CT head was negative on admission.  Abdominal x-ray showed moderate stool burden and patient was confused.  MRI of the brain, EEG and other neurological work-up was negative.  She continues to have poor oral intake, lack of desire to eat/drink with flat affect.  Psychiatry consulted.  Started on Remeron .Seroquel.  Waiting for dietician/psychiatric recommendation before discharge planning.  Assessment & Plan:   Principal Problem:   Acute encephalopathy Active Problems:   Hypoglycemia   Constipation   Bipolar disorder (HCC)   Diabetes (HCC)   AMS (altered mental status)   Poor appetite   Acute encephalopathy/flat affect: Unclear etiology.  No focal neurological deficits.  Brain imagings negative for acute intracranial abnormalities.  EEG did not show any seizures.  UA negative, UDS negative.  Procalcitonin negative ruling out infectious process.  Vitamin B12, ammonia, TSH normal.  Psychiatry consulted and following.  Currently on Remeron, Seroquel.  She still is not coherent and did not participate with any meaningful conversation.  Psychiatry recommending to follow-up daily before final recommendation.  History of seizure: Continue Keppra 1 g twice daily, converted to IV due to poor oral intake.  EEG did not show any seizure or epileptiform discharge.  Neurology was following.  Bipolar disorder: On risperidone, trazodone at home.  Diabetes /poor oral intake: Intermittently hypoglycemic. Was on D5, now discontinued because blood sugars are stable.  Nutrition  consulted.  Very poor oral intake.  Takes Amaryl, metformin, Lantus, sliding scale at home.  Hemoglobin A1c of 6.4.  Dietitian recommending putting core track if her oral intake remains poor.  We will follow-up with dietitian recommendation.  Hypotension: Blood pressure soft.  Continue to monitor.  Hypokalemia/hypomagnesemia: Supplemented with potassium/magnesium and being monitored  Nutrition Problem: Inadequate oral intake Etiology: lethargy/confusion      DVT prophylaxis:Lovenox Code Status: Full Family Communication: Called and discussed with sister on phone on 01/27/21 Status is: Inpatient  Remains inpatient appropriate because:Unsafe d/c plan  Dispo: The patient is from: Home              Anticipated d/c is to: Home              Patient currently is not medically stable to d/c.   Difficult to place patient No    Consultants: Psychiatry, neurology  Procedures: None  Antimicrobials:  Anti-infectives (From admission, onward)    Start     Dose/Rate Route Frequency Ordered Stop   01/25/21 2200  valACYclovir (VALTREX) tablet 500 mg        500 mg Oral Daily at bedtime 01/25/21 0930         Subjective: Patient seen and examined the bedside this morning.  Hemodynamically stable.  Sleeping with bed sheets covering her face.  Did not participate with any meaningful conversation.  Denied physical examination  Objective: Vitals:   01/27/21 1900 01/27/21 2315 01/28/21 0500 01/28/21 0808  BP: 129/74 (!) 95/57 134/62 (!) 97/54  Pulse: 87 82 83 85  Resp: 18 18 18 20   Temp: 98.3 F (36.8 C) 97.9 F (36.6 C) 98 F (36.7 C) 98.2 F (36.8 C)  TempSrc: Oral Oral Oral  Oral  SpO2: 98% 95% 98% 98%    Intake/Output Summary (Last 24 hours) at 01/28/2021 4098 Last data filed at 01/28/2021 0300 Gross per 24 hour  Intake 265.07 ml  Output --  Net 265.07 ml   There were no vitals filed for this visit.  Examination:  General exam: Overall comfortable, not in distress, obese,  denied physical examination, did not participate in conversation.    Data Reviewed: I have personally reviewed following labs and imaging studies  CBC: Recent Labs  Lab 01/23/21 1905 01/23/21 2004 01/25/21 0438 01/26/21 0318 01/27/21 0151  WBC 9.2  --  7.1 7.3 7.2  NEUTROABS 4.7  --   --   --   --   HGB 13.7 14.6 13.0 12.3 12.5  HCT 42.1 43.0 39.2 37.3 37.4  MCV 91.5  --  91.2 89.9 90.3  PLT 442*  --  370 360 338   Basic Metabolic Panel: Recent Labs  Lab 01/23/21 1905 01/23/21 2004 01/25/21 0438 01/26/21 0318 01/26/21 0614 01/27/21 0151 01/28/21 0428  NA 138 140 137  --  136 137 138  K 3.5 3.5 3.5  --  3.3* 3.2* 3.9  CL 102 102 104  --  103 105 103  CO2 23  --  25  --  25 24 25   GLUCOSE 81 81 145*  --  146* 156* 87  BUN 11 12 5*  --  <5* <5* <5*  CREATININE 0.96 0.80 0.70  --  0.77 0.76 0.85  CALCIUM 9.8  --  9.4  --  9.6 9.4 9.5  MG  --   --  1.8 1.7  --  1.6* 2.2  PHOS  --   --   --   --   --   --  3.3   GFR: CrCl cannot be calculated (Unknown ideal weight.). Liver Function Tests: Recent Labs  Lab 01/23/21 1905  AST 39  ALT 28  ALKPHOS 75  BILITOT 0.9  PROT 8.1  ALBUMIN 3.9   Recent Labs  Lab 01/23/21 1905  LIPASE 28   Recent Labs  Lab 01/24/21 0704  AMMONIA 24   Coagulation Profile: Recent Labs  Lab 01/23/21 1905  INR 0.9   Cardiac Enzymes: No results for input(s): CKTOTAL, CKMB, CKMBINDEX, TROPONINI in the last 168 hours. BNP (last 3 results) No results for input(s): PROBNP in the last 8760 hours. HbA1C: No results for input(s): HGBA1C in the last 72 hours. CBG: Recent Labs  Lab 01/27/21 1202 01/27/21 1555 01/27/21 2038 01/28/21 0012 01/28/21 0620  GLUCAP 142* 120* 115* 109* 101*   Lipid Profile: No results for input(s): CHOL, HDL, LDLCALC, TRIG, CHOLHDL, LDLDIRECT in the last 72 hours. Thyroid Function Tests: No results for input(s): TSH, T4TOTAL, FREET4, T3FREE, THYROIDAB in the last 72 hours. Anemia Panel: No results  for input(s): VITAMINB12, FOLATE, FERRITIN, TIBC, IRON, RETICCTPCT in the last 72 hours. Sepsis Labs: Recent Labs  Lab 01/25/21 1018  PROCALCITON <0.10    Recent Results (from the past 240 hour(s))  SARS CORONAVIRUS 2 (TAT 6-24 HRS) Nasopharyngeal Nasopharyngeal Swab     Status: None   Collection Time: 01/24/21 12:23 AM   Specimen: Nasopharyngeal Swab  Result Value Ref Range Status   SARS Coronavirus 2 NEGATIVE NEGATIVE Final    Comment: (NOTE) SARS-CoV-2 target nucleic acids are NOT DETECTED.  The SARS-CoV-2 RNA is generally detectable in upper and lower respiratory specimens during the acute phase of infection. Negative results do not preclude SARS-CoV-2 infection, do not rule  out co-infections with other pathogens, and should not be used as the sole basis for treatment or other patient management decisions. Negative results must be combined with clinical observations, patient history, and epidemiological information. The expected result is Negative.  Fact Sheet for Patients: HairSlick.no  Fact Sheet for Healthcare Providers: quierodirigir.com  This test is not yet approved or cleared by the [place] FDA and  has been authorized for detection and/or diagnosis of SARS-CoV-2 by FDA under an Emergency Use Authorization (EUA). This EUA will remain  in effect (meaning this test can be used) for the duration of the COVID-19 declaration under Se ction 564(b)(1) of the Act, 21 U.S.C. section 360bbb-3(b)(1), unless the authorization is terminated or revoked sooner.  Performed at [facility], [address], [place], [place] [address]          Radiology Studies: No results found.      Scheduled Meds:  bisacodyl  10 mg Rectal Q1500   enoxaparin (LOVENOX) injection  40 mg Subcutaneous Q24H   feeding supplement  237 mL Oral BID BM   mirtazapine  7.5 mg Oral QHS   multivitamin with minerals  1 tablet  Oral Daily   polyethylene glycol  17 g Oral BID   QUEtiapine  200 mg Oral QHS   sodium chloride flush  3 mL Intravenous Once   valACYclovir  500 mg Oral QHS   Continuous Infusions:  levETIRAcetam Stopped (01/28/21 0500)   valproate sodium       LOS: 3 days    Time spent: 25 mins,More than 50% of that time was spent in counseling and/or coordination of care.      Burnadette Pop, MD [facility] P8/05/2021, 8:22 AM

## 2021-01-28 NOTE — Plan of Care (Signed)

## 2021-01-28 NOTE — Progress Notes (Signed)
Patient has been refusing all oral intake all day She continued to be combative with nurse, nurse tech , and dietician during nasal tube insertion. The procedure was not successful at this time , even after hadol was given to calm her down,  patient still struggles to avoid  cotrak insertion. MD notified. Will continue to monitor.

## 2021-01-28 NOTE — Consult Note (Signed)
[facility] Psych Consult Progress Note  01/28/2021 2:09 PM [name]  MRN:  [id number] Reason for Consult:  Flat affect, refusal to eat, and medication management Referring Physician:  Stephania Fragmin, MD Method of visit?: Face to Face      Principal Problem: Acute encephalopathy Diagnosis:  Principal Problem:   Acute encephalopathy Active Problems:   Hypoglycemia   Constipation   Bipolar disorder (HCC)   Diabetes (HCC)   AMS (altered mental status)   Poor appetite  Subjective: [name] is a 55 y.o. female patient admitted for evaluation of acute mental status change and consulted to the psychiatry service for potential medication management in light of flat affect and refusal to eat. Her assessment was unchanged from assessment on 8/11. She was given the loading dose of Depakote 1500 mg IV, and she denies side effects. She still refuses PO intake and uses the same child-like voice to answer questions. She continues to deny SI/HI/AVH, but it remains unclear her understanding of these questions.  Total Time spent with patient: 15 minutes  Past Psychiatric History: Bipolar disorder, depressive disorder-unspecified (2006), IDD Inpatient admission- [facility] (November 2005), [facility] (2006 and 2009) Outpatient: [facility] during the Fall of 2005 and early 2006  Past Medical History:  Past Medical History:  Diagnosis Date   Diabetes mellitus without complication (HCC)    Hypertension    Immune deficiency disorder (HCC)    Mild mental retardation    History reviewed. No pertinent surgical history. Family History:  Family History  Problem Relation Age of Onset   Hypertension Other    Family Psychiatric  History: Unknown Social History:  Social History   Substance and Sexual Activity  Alcohol Use No     Social History   Substance and Sexual Activity  Drug Use Never    Social History   Socioeconomic History   Marital status: Single    Spouse  name: Not on file   Number of children: Not on file   Years of education: Not on file   Highest education level: Not on file  Occupational History   Not on file  Tobacco Use   Smoking status: Never   Smokeless tobacco: Never  Substance and Sexual Activity   Alcohol use: No   Drug use: Never   Sexual activity: Never  Other Topics Concern   Not on file  Social History Narrative   Not on file   Social Determinants of Health   Financial Resource Strain: Not on file  Food Insecurity: Not on file  Transportation Needs: Not on file  Physical Activity: Not on file  Stress: Not on file  Social Connections: Not on file    Sleep: Good  Appetite:   Pt refuses to eat, since Friday, 8/5. RD to follow for Cortrak placement.   Current Medications: Current Facility-Administered Medications  Medication Dose Route Frequency Provider Last Rate Last Admin   acetaminophen (TYLENOL) tablet 650 mg  650 mg Oral Q6H PRN John Giovanni, MD       Or   acetaminophen (TYLENOL) suppository 650 mg  650 mg Rectal Q6H PRN John Giovanni, MD       bisacodyl (DULCOLAX) suppository 10 mg  10 mg Rectal Q1500 Amin, Ankit Chirag, MD   10 mg at 01/25/21 1830   dextromethorphan-guaiFENesin (MUCINEX DM) 30-600 MG per 12 hr tablet 1 tablet  1 tablet Oral BID PRN Amin, Loura Halt, MD       enoxaparin (LOVENOX) injection  40 mg  40 mg Subcutaneous Q24H John Giovanni, MD   40 mg at 01/28/21 1108   feeding supplement (ENSURE ENLIVE / ENSURE PLUS) liquid 237 mL  237 mL Oral BID BM Adhikari, Amrit, MD       haloperidol lactate (HALDOL) injection 2 mg  2 mg Intramuscular Q6H PRN Mansy, Jan A, MD       haloperidol lactate (HALDOL) injection 2 mg  2 mg Intravenous Q12H Lamar Sprinkles, MD       hydrALAZINE (APRESOLINE) injection 10 mg  10 mg Intravenous Q4H PRN Amin, Ankit Chirag, MD       ipratropium-albuterol (DUONEB) 0.5-2.5 (3) MG/3ML nebulizer solution 3 mL  3 mL Nebulization Q4H PRN Amin, Ankit Chirag,  MD       levETIRAcetam (KEPPRA) IVPB 1000 mg/100 mL premix  1,000 mg Intravenous Q12H Amin, Ankit Chirag, MD 400 mL/hr at 01/28/21 1105 1,000 mg at 01/28/21 1105   mirtazapine (REMERON) tablet 7.5 mg  7.5 mg Oral QHS Lamar Sprinkles, MD       multivitamin with minerals tablet 1 tablet  1 tablet Oral Daily Adhikari, Amrit, MD       polyethylene glycol (MIRALAX / GLYCOLAX) packet 17 g  17 g Oral BID Amin, Ankit Chirag, MD   17 g at 01/26/21 1059   QUEtiapine (SEROQUEL) tablet 200 mg  200 mg Oral QHS Lamar Sprinkles, MD       senna-docusate (Senokot-S) tablet 1 tablet  1 tablet Oral QHS PRN Amin, Ankit Chirag, MD       sodium chloride flush (NS) 0.9 % injection 3 mL  3 mL Intravenous Once John Giovanni, MD       valACYclovir (VALTREX) tablet 500 mg  500 mg Oral QHS Amin, Ankit Chirag, MD       valproate (DEPACON) 500 mg in dextrose 5 % 50 mL IVPB  500 mg Intravenous Q12H Lamar Sprinkles, MD 55 mL/hr at 01/28/21 1116 500 mg at 01/28/21 1116    Lab Results:  Results for orders placed or performed during the hospital encounter of 01/23/21 (from the past 48 hour(s))  Glucose, capillary     Status: Abnormal   Collection Time: 01/26/21  4:04 PM  Result Value Ref Range   Glucose-Capillary 138 (H) 70 - 99 mg/dL    Comment: Glucose reference range applies only to samples taken after fasting for at least 8 hours.  Glucose, capillary     Status: Abnormal   Collection Time: 01/26/21  9:22 PM  Result Value Ref Range   Glucose-Capillary 135 (H) 70 - 99 mg/dL    Comment: Glucose reference range applies only to samples taken after fasting for at least 8 hours.   Comment 1 Notify RN    Comment 2 Document in Chart   Glucose, capillary     Status: Abnormal   Collection Time: 01/27/21 12:30 AM  Result Value Ref Range   Glucose-Capillary 151 (H) 70 - 99 mg/dL    Comment: Glucose reference range applies only to samples taken after fasting for at least 8 hours.   Comment 1 Notify RN    Comment 2 Document  in Chart   Basic metabolic panel     Status: Abnormal   Collection Time: 01/27/21  1:51 AM  Result Value Ref Range   Sodium 137 135 - 145 mmol/L   Potassium 3.2 (L) 3.5 - 5.1 mmol/L   Chloride 105 98 - 111 mmol/L   CO2 24 22 - 32 mmol/L   Glucose,  Bld 156 (H) 70 - 99 mg/dL    Comment: Glucose reference range applies only to samples taken after fasting for at least 8 hours.   BUN <5 (L) 6 - 20 mg/dL   Creatinine, Ser 5.46 0.44 - 1.00 mg/dL   Calcium 9.4 8.9 - 50.3 mg/dL   GFR, Estimated >54 >65 mL/min    Comment: (NOTE) Calculated using the CKD-EPI Creatinine Equation (2021)    Anion gap 8 5 - 15    Comment: Performed at [facility], [address] [place], [place] [address]  CBC     Status: None   Collection Time: 01/27/21  1:51 AM  Result Value Ref Range   WBC 7.2 4.0 - 10.5 K/uL   RBC 4.14 3.87 - 5.11 MIL/uL   Hemoglobin 12.5 12.0 - 15.0 g/dL   HCT 51.7 00.1 - 74.9 %   MCV 90.3 80.0 - 100.0 fL   MCH 30.2 26.0 - 34.0 pg   MCHC 33.4 30.0 - 36.0 g/dL   RDW 44.9 67.5 - 91.6 %   Platelets 338 150 - 400 K/uL   nRBC 0.0 0.0 - 0.2 %    Comment: Performed at [facility], [address], [place], [place] [address]  Magnesium     Status: Abnormal   Collection Time: 01/27/21  1:51 AM  Result Value Ref Range   Magnesium 1.6 (L) 1.7 - 2.4 mg/dL    Comment: Performed at [facility], [address], [place], [place] [address]  Glucose, capillary     Status: Abnormal   Collection Time: 01/27/21  3:08 AM  Result Value Ref Range   Glucose-Capillary 154 (H) 70 - 99 mg/dL    Comment: Glucose reference range applies only to samples taken after fasting for at least 8 hours.   Comment 1 Notify RN    Comment 2 Document in Chart   Glucose, capillary     Status: Abnormal   Collection Time: 01/27/21  7:37 AM  Result Value Ref Range   Glucose-Capillary 166 (H) 70 - 99 mg/dL    Comment: Glucose reference range applies only to samples taken after fasting for at least 8  hours.  Glucose, capillary     Status: Abnormal   Collection Time: 01/27/21 12:02 PM  Result Value Ref Range   Glucose-Capillary 142 (H) 70 - 99 mg/dL    Comment: Glucose reference range applies only to samples taken after fasting for at least 8 hours.  Valproic acid level     Status: Abnormal   Collection Time: 01/27/21  3:55 PM  Result Value Ref Range   Valproic Acid Lvl <10 (L) 50.0 - 100.0 ug/mL    Comment: RESULTS CONFIRMED BY MANUAL DILUTION Performed at [facility], [address], [place], [place] [address]   Glucose, capillary     Status: Abnormal   Collection Time: 01/27/21  3:55 PM  Result Value Ref Range   Glucose-Capillary 120 (H) 70 - 99 mg/dL    Comment: Glucose reference range applies only to samples taken after fasting for at least 8 hours.  Glucose, capillary     Status: Abnormal   Collection Time: 01/27/21  8:38 PM  Result Value Ref Range   Glucose-Capillary 115 (H) 70 - 99 mg/dL    Comment: Glucose reference range applies only to samples taken after fasting for at least 8 hours.   Comment 1 Notify RN   Glucose, capillary     Status: Abnormal  Collection Time: 01/28/21 12:12 AM  Result Value Ref Range   Glucose-Capillary 109 (H) 70 - 99 mg/dL    Comment: Glucose reference range applies only to samples taken after fasting for at least 8 hours.  RPR     Status: None   Collection Time: 01/28/21  4:28 AM  Result Value Ref Range   RPR Ser Ql NON REACTIVE NON REACTIVE    Comment: Performed at [facility], [address] [place], [place] [address]  HIV Antibody (routine testing w rflx)     Status: None   Collection Time: 01/28/21  4:28 AM  Result Value Ref Range   HIV Screen 4th Generation wRfx Non Reactive Non Reactive    Comment: Performed at [facility], [address] [place], [place] [address]  Basic metabolic panel     Status: Abnormal   Collection Time: 01/28/21  4:28 AM  Result Value Ref Range   Sodium 138 135 - 145 mmol/L    Potassium 3.9 3.5 - 5.1 mmol/L   Chloride 103 98 - 111 mmol/L   CO2 25 22 - 32 mmol/L   Glucose, Bld 87 70 - 99 mg/dL    Comment: Glucose reference range applies only to samples taken after fasting for at least 8 hours.   BUN <5 (L) 6 - 20 mg/dL   Creatinine, Ser 4.780.85 0.44 - 1.00 mg/dL   Calcium 9.5 8.9 - 29.510.3 mg/dL   GFR, Estimated >62>60 >13>60 mL/min    Comment: (NOTE) Calculated using the CKD-EPI Creatinine Equation (2021)    Anion gap 10 5 - 15    Comment: Performed at [facility], [address] [place], [place] [address]  Magnesium     Status: None   Collection Time: 01/28/21  4:28 AM  Result Value Ref Range   Magnesium 2.2 1.7 - 2.4 mg/dL    Comment: Performed at [facility], [address] [place], [place] [address]  Phosphorus     Status: None   Collection Time: 01/28/21  4:28 AM  Result Value Ref Range   Phosphorus 3.3 2.5 - 4.6 mg/dL    Comment: Performed at [facility], [address] [place], [place] [address]  Glucose, capillary     Status: Abnormal   Collection Time: 01/28/21  6:20 AM  Result Value Ref Range   Glucose-Capillary 101 (H) 70 - 99 mg/dL    Comment: Glucose reference range applies only to samples taken after fasting for at least 8 hours.   Comment 1 Notify RN   Glucose, capillary     Status: Abnormal   Collection Time: 01/28/21  8:10 AM  Result Value Ref Range   Glucose-Capillary 103 (H) 70 - 99 mg/dL    Comment: Glucose reference range applies only to samples taken after fasting for at least 8 hours.   Comment 1 Notify RN    Comment 2 Document in Chart   Glucose, capillary     Status: None   Collection Time: 01/28/21 12:21 PM  Result Value Ref Range   Glucose-Capillary 95 70 - 99 mg/dL    Comment: Glucose reference range applies only to samples taken after fasting for at least 8 hours.   Comment 1 Notify RN    Comment 2 Document in Chart     Blood Alcohol level:  Lab Results  Component Value Date   ETH <10 10/28/2020    [facility]    <5  LOWEST DETECTABLE LIMIT FOR SERUM ALCOHOL IS 11 mg/dL FOR MEDICAL PURPOSES ONLY    Physical Findings: Musculoskeletal: Strength & Muscle Tone: within normal limits Gait & Station:  unobserved Patient leans: N/A  Psychiatric Specialty Exam:  Presentation  General Appearance: Appropriate for Environment  Eye Contact:Minimal  Speech:Clear and Coherent  Speech Volume:Normal  Handedness: No data recorded  Mood and Affect  Mood:Irritable  Affect:Congruent   Thought Process  Thought Processes:Goal Directed  Descriptions of Associations:No data recorded Orientation:Partial  Thought Content:No data recorded History of Schizophrenia/Schizoaffective disorder:No data recorded Duration of Psychotic Symptoms:No data recorded Hallucinations:Hallucinations: None  Ideas of Reference:None  Suicidal Thoughts:Suicidal Thoughts: No  Homicidal Thoughts:Homicidal Thoughts: No   Sensorium  Memory:-- (Unable to assess (UTA))  Judgment:Poor  Insight:Lacking   Executive Functions  Concentration:Poor  Attention Span:Poor  Recall:Poor  Fund of Knowledge:Poor  Language:Poor   Psychomotor Activity  Psychomotor Activity:Psychomotor Activity: Normal   Assets  Assets:Social Support   Sleep  Sleep:Sleep: Good   Physical Exam: Physical Exam Vitals reviewed.  HENT:     Head: Normocephalic and atraumatic.  Eyes:     Extraocular Movements: Extraocular movements intact.  Cardiovascular:     Rate and Rhythm: Normal rate.  Pulmonary:     Effort: Pulmonary effort is normal.  Neurological:     Mental Status: She is alert and oriented to person, place, and time.  Psychiatric:        Attention and Perception: She does not perceive auditory or visual hallucinations.        Mood and Affect: Mood is elated. Affect is blunt and inappropriate.        Behavior: Behavior is uncooperative.        Thought Content: Thought content is  delusional. Thought content does not include homicidal or suicidal ideation.   Review of Systems  Unable to perform ROS: Psychiatric disorder  Psychiatric/Behavioral:  Negative for depression, hallucinations and suicidal ideas. The patient does not have insomnia.   Able to obtain limited ROS. Blood pressure (!) 101/59, pulse 93, temperature 99.2 F (37.3 C), temperature source Oral, resp. rate 20, SpO2 96 %. There is no height or weight on file to calculate BMI.  Treatment Plan Summary: Shadow remains inappropriate and immature in responses and still refuses all PO intake. Her sister describes her current presentation being completely different from her usual. At this time, there is still no clear etiology of her presentation. After Depakote loading dose, her presentation remains unchanged.    Ddx: Acute Psychosis 2/2 Bipolar disorder with psychotic features vs. MDD- recurrent/severe/with psychotic features vs. IDD symptomology -RPR and HIV neg; Depakote level <10 -Ordered Seroquel 200 mg qHS and Remeron 7.5 mg qHS but patient refusing PO. -Discontinue Depakote 500mg  IV BID since patient taking home Keppra via IV. -Begin Haldol 2 mg IV BID.   Disposition: Psychiatry will continue to follow.   , MD 01/28/2021, 2:09 PM

## 2021-01-28 NOTE — Progress Notes (Signed)
Nutrition Follow-up  DOCUMENTATION CODES:  Not applicable  INTERVENTION:  -Please obtain updated weight -d/c Ensure (pt refusing)  Once Cortrak is placed & ready for use: -Initiate Osmolite 1.2 @ 61ml/hr, advance by 31ml/hr Q8H until goal rate of 48ml/hr (1536ml/d) is reached -53ml Prosource TF daily - free water Q4H At goal, TF will provide 1912 kcals, 97 grams protein, free water ( total free water)  Patient will be at risk for refeeding syndrome once feeding is initiated. Monitor magnesium, potassium, and phosphorus daily for at least 3 days, MD to replete as needed.  NUTRITION DIAGNOSIS:  Inadequate oral intake related to lethargy/confusion as evidenced by meal completion < 25%, per patient/family report. --ongoing  GOAL:  Patient will meet greater than or equal to 90% of their needs -- will address with TF  MONITOR:  PO intake, Supplement acceptance, I & O's  REASON FOR ASSESSMENT:  Consult Assessment of nutrition requirement/status  ASSESSMENT:  Pt with PMH significant of intellectual disability, bipolar disorder, seizure disorder, type 2 DM, HTN, HLD, immunodeficiency disorder presented to the ED for change in mental status as reported by patient's sister for the past couple of days at home. CT head, MRI brain, EEG, and other neurologic work-up have been negative. Abdominal x-ray showed moderate stool burden. Pt does have h/o constipation and was recommended by GI to take Linzess and Dulcolax, though pt has been noncompliant. Pt has outpatient colonoscopy scheduled.  Pt has continued to have poor PO intake. Discussed with MD. Pt to receive Cortrak today. TF recommendations provided. Monitor magnesium, potassium, and phosphorus daily for at least 3 days, MD to replete as needed, as pt is at risk for refeeding syndrome given prolonged poor po intake.  Need new weight measured as no weight obtained for this admission.   PO intake: 0% x1 recorded meal,  refusing Ensure Enlive/Plus  Medications: dulcolax, remeron, mvi with minerals, miralax Labs reviewed. CBGs [phone]  Diet Order:   Diet Order             Diet regular Room service appropriate? Yes; Fluid consistency: Thin  Diet effective now                  EDUCATION NEEDS:  Not appropriate for education at this time  Skin:  Skin Assessment: Reviewed RN Assessment  Last BM:  8/12  Height:  Ht Readings from Last 1 Encounters:  10/30/20 5\' 1"  (1.549 m)   Weight:  Wt Readings from Last 1 Encounters:  10/30/20 83 kg   BMI:  There is no height or weight on file to calculate BMI.  Estimated Nutritional Needs:  Kcal:  1800-2000 Protein:  90-100 grams Fluid:  >1.8L/d    11/01/20, MS, RD, LDN (she/her/hers) RD pager number and weekend/on-call pager number located in Amion.

## 2021-01-28 NOTE — Progress Notes (Signed)
Cortrak Tube Team Note:  Consult received to place a Cortrak feeding tube.   Cortrak tube placement attempted after administration of Haldol and with assistance of one RN and one NT. Pt screaming that she does not want a tube down her nose and thrashing all over the bed. Unable to maintain pt in a safe position to continue Cortrak tube placement. Tube placement aborted due to safety concerns. MD notified.   Betsey Holiday MS, RD, LDN Please refer to [facility] for RD and/or RD on-call/weekend/after hours pager

## 2021-01-29 LAB — BASIC METABOLIC PANEL
Anion gap: 9 (ref 5–15)
BUN: 5 mg/dL — ABNORMAL LOW (ref 6–20)
CO2: 23 mmol/L (ref 22–32)
Calcium: 9.2 mg/dL (ref 8.9–10.3)
Chloride: 105 mmol/L (ref 98–111)
Creatinine, Ser: 0.8 mg/dL (ref 0.44–1.00)
GFR, Estimated: >60 mL/min (ref >60)
Glucose, Bld: 137 mg/dL — ABNORMAL HIGH (ref 70–99)
Potassium: 3.8 mmol/L (ref 3.5–5.1)
Sodium: 137 mmol/L (ref 135–145)

## 2021-01-29 LAB — GLUCOSE, CAPILLARY
Glucose-Capillary: 120 mg/dL — ABNORMAL HIGH (ref 70–99)
Glucose-Capillary: 122 mg/dL — ABNORMAL HIGH (ref 70–99)
Glucose-Capillary: 124 mg/dL — ABNORMAL HIGH (ref 70–99)
Glucose-Capillary: 132 mg/dL — ABNORMAL HIGH (ref 70–99)
Glucose-Capillary: 139 mg/dL — ABNORMAL HIGH (ref 70–99)
Glucose-Capillary: 160 mg/dL — ABNORMAL HIGH (ref 70–99)

## 2021-01-29 LAB — MAGNESIUM: Magnesium: 2.1 mg/dL (ref 1.7–2.4)

## 2021-01-29 LAB — PHOSPHORUS: Phosphorus: 3.9 mg/dL (ref 2.5–4.6)

## 2021-01-29 NOTE — Progress Notes (Signed)
PROGRESS NOTE    [name]  AYT:[id number] DOB: September 03, 1965 DOA: 01/23/2021 PCP: Mirna Mires, MD   Chief Complain: Altered mental status  Brief Narrative: Patient is a 55 year old female with history of intellectual disability, bipolar disorder, seizure disorder, diabetes type 2, hypertension, hyperlipidemia, immunodeficiency disorder who presented to the emergency department from home for evaluation of altered mental status.  CT head was negative on admission.  Abdominal x-ray showed moderate stool burden and patient was confused.  MRI of the brain, EEG and other neurological work-up was negative.  She continues to have poor oral intake, lack of desire to eat/drink with flat affect.  Psychiatry consulted and following.  Started on Remeron,Seroquel. She declined feeding tube placement. Waiting for dietician/psychiatric recommendation before discharge planning.  Assessment & Plan:   Principal Problem:   Acute encephalopathy Active Problems:   Hypoglycemia   Constipation   Bipolar disorder (HCC)   Diabetes (HCC)   AMS (altered mental status)   Poor appetite   Acute encephalopathy/flat affect: Unclear etiology.  No focal neurological deficits.  Brain imagings negative for acute intracranial abnormalities.  EEG did not show any seizures.  UA negative, UDS negative.  Procalcitonin negative ruling out infectious process.  Vitamin B12, ammonia, TSH normal.  Psychiatry consulted and following.  Currently on Remeron, Seroquel.  She still is not coherent and does not participate with any meaningful conversation.  Psychiatry recommending to follow-up daily before final recommendation.  History of seizure: Continue Keppra 1 g twice daily, converted to IV due to poor oral intake.  EEG did not show any seizure or epileptiform discharge.  Neurology was following.  Bipolar disorder: On risperidone, trazodone at home.  Diabetes /poor oral intake: Intermittently hypoglycemic. Was on D5, now  discontinued because blood sugars are stable.  Nutrition consulted.  Very poor oral intake.  Takes Amaryl, metformin, Lantus, sliding scale at home.  Hemoglobin A1c of 6.4.  Dietitian recommending putting core track because heroral intake remains poor.  Patient declined core track placement  Hypotension: Blood pressure soft.  Continue to monitor.  Hypokalemia/hypomagnesemia: Supplemented with potassium/magnesium and being monitored  Nutrition Problem: Inadequate oral intake Etiology: lethargy/confusion      DVT prophylaxis:Lovenox Code Status: Full Family Communication: Called and discussed with sister on phone on 01/27/21 Status is: Inpatient  Remains inpatient appropriate because:Unsafe d/c plan  Dispo: The patient is from: Home              Anticipated d/c is to: Home              Patient currently is not medically stable to d/c.   Difficult to place patient No    Consultants: Psychiatry, neurology  Procedures: None  Antimicrobials:  Anti-infectives (From admission, onward)    Start     Dose/Rate Route Frequency Ordered Stop   01/25/21 2200  valACYclovir (VALTREX) tablet 500 mg        500 mg Oral Daily at bedtime 01/25/21 0930         Subjective: Patient seen and examined at the bedside this morning.  Hemodynamically stable, looks comfortable.  He was sleeping with her eyes closed and bedsheet over her face.  When I talk to her, she communicated well.  She was alert and oriented.  When I asked if she wanted to eat food, she said no  Objective: Vitals:   01/28/21 1945 01/28/21 2300 01/29/21 0300 01/29/21 0734  BP: 134/60 (!) 112/58 119/87 101/74  Pulse: 80 86 94 76  Resp: 18  18 20 16   Temp: 98 F (36.7 C) 98 F (36.7 C) 99 F (37.2 C) 97.9 F (36.6 C)  TempSrc: Oral Oral Oral Oral  SpO2: 100% 99% 96% 100%    Intake/Output Summary (Last 24 hours) at 01/29/2021 0813 Last data filed at 01/29/2021 0133 Gross per 24 hour  Intake 741.01 ml  Output --  Net  741.01 ml   There were no vitals filed for this visit.  Examination:  General exam: Overall comfortable, not in distress,obese HEENT: PERRL Respiratory system:  no wheezes or crackles  Cardiovascular system: S1 & S2 heard, RRR.  Gastrointestinal system: Abdomen is nondistended, soft and nontender. Central nervous system: Alert and oriented Extremities: No edema, no clubbing ,no cyanosis Skin: No rashes, no ulcers,no icterus      Data Reviewed: I have personally reviewed following labs and imaging studies  CBC: Recent Labs  Lab 01/23/21 1905 01/23/21 2004 01/25/21 0438 01/26/21 0318 01/27/21 0151  WBC 9.2  --  7.1 7.3 7.2  NEUTROABS 4.7  --   --   --   --   HGB 13.7 14.6 13.0 12.3 12.5  HCT 42.1 43.0 39.2 37.3 37.4  MCV 91.5  --  91.2 89.9 90.3  PLT 442*  --  370 360 338   Basic Metabolic Panel: Recent Labs  Lab 01/23/21 1905 01/23/21 2004 01/25/21 0438 01/26/21 0318 01/26/21 0614 01/27/21 0151 01/28/21 0428  NA 138 140 137  --  136 137 138  K 3.5 3.5 3.5  --  3.3* 3.2* 3.9  CL 102 102 104  --  103 105 103  CO2 23  --  25  --  25 24 25   GLUCOSE 81 81 145*  --  146* 156* 87  BUN 11 12 5*  --  <5* <5* <5*  CREATININE 0.96 0.80 0.70  --  0.77 0.76 0.85  CALCIUM 9.8  --  9.4  --  9.6 9.4 9.5  MG  --   --  1.8 1.7  --  1.6* 2.2  PHOS  --   --   --   --   --   --  3.3   GFR: CrCl cannot be calculated (Unknown ideal weight.). Liver Function Tests: Recent Labs  Lab 01/23/21 1905  AST 39  ALT 28  ALKPHOS 75  BILITOT 0.9  PROT 8.1  ALBUMIN 3.9   Recent Labs  Lab 01/23/21 1905  LIPASE 28   Recent Labs  Lab 01/24/21 0704  AMMONIA 24   Coagulation Profile: Recent Labs  Lab 01/23/21 1905  INR 0.9   Cardiac Enzymes: No results for input(s): CKTOTAL, CKMB, CKMBINDEX, TROPONINI in the last 168 hours. BNP (last 3 results) No results for input(s): PROBNP in the last 8760 hours. HbA1C: No results for input(s): HGBA1C in the last 72  hours. CBG: Recent Labs  Lab 01/28/21 1904 01/28/21 2048 01/28/21 2349 01/29/21 0405 01/29/21 0737  GLUCAP 173* 126* 109* 120* 139*   Lipid Profile: No results for input(s): CHOL, HDL, LDLCALC, TRIG, CHOLHDL, LDLDIRECT in the last 72 hours. Thyroid Function Tests: No results for input(s): TSH, T4TOTAL, FREET4, T3FREE, THYROIDAB in the last 72 hours. Anemia Panel: No results for input(s): VITAMINB12, FOLATE, FERRITIN, TIBC, IRON, RETICCTPCT in the last 72 hours. Sepsis Labs: Recent Labs  Lab 01/25/21 1018  PROCALCITON <0.10    Recent Results (from the past 240 hour(s))  SARS CORONAVIRUS 2 (TAT 6-24 HRS) Nasopharyngeal Nasopharyngeal Swab     Status: None   Collection  Time: 01/24/21 12:23 AM   Specimen: Nasopharyngeal Swab  Result Value Ref Range Status   SARS Coronavirus 2 NEGATIVE NEGATIVE Final    Comment: (NOTE) SARS-CoV-2 target nucleic acids are NOT DETECTED.  The SARS-CoV-2 RNA is generally detectable in upper and lower respiratory specimens during the acute phase of infection. Negative results do not preclude SARS-CoV-2 infection, do not rule out co-infections with other pathogens, and should not be used as the sole basis for treatment or other patient management decisions. Negative results must be combined with clinical observations, patient history, and epidemiological information. The expected result is Negative.  Fact Sheet for Patients: HairSlick.no  Fact Sheet for Healthcare Providers: quierodirigir.com  This test is not yet approved or cleared by the [place] FDA and  has been authorized for detection and/or diagnosis of SARS-CoV-2 by FDA under an Emergency Use Authorization (EUA). This EUA will remain  in effect (meaning this test can be used) for the duration of the COVID-19 declaration under Se ction 564(b)(1) of the Act, 21 U.S.C. section 360bbb-3(b)(1), unless the authorization is  terminated or revoked sooner.  Performed at [facility], [address], [place], [place] [address]          Radiology Studies: No results found.      Scheduled Meds:  bisacodyl  10 mg Rectal Q1500   enoxaparin (LOVENOX) injection  40 mg Subcutaneous Q24H   feeding supplement (PROSource TF)  45 mL Per Tube Daily   free water  100 mL Per Tube Q4H   haloperidol lactate  2 mg Intravenous Q12H   mirtazapine  7.5 mg Oral QHS   multivitamin with minerals  1 tablet Oral Daily   polyethylene glycol  17 g Oral BID   QUEtiapine  200 mg Oral QHS   sodium chloride flush  3 mL Intravenous Once   valACYclovir  500 mg Oral QHS   Continuous Infusions:  dextrose 75 mL/hr at 01/29/21 0715   feeding supplement (OSMOLITE 1.2 CAL)     levETIRAcetam Stopped (01/29/21 0640)     LOS: 4 days    Time spent: 25 mins,More than 50% of that time was spent in counseling and/or coordination of care.      Burnadette Pop, MD [facility] P8/13/2022, 8:13 AM

## 2021-01-29 NOTE — Consult Note (Signed)
[facility] Psych Consult Progress Note  01/29/2021 10:18 AM [name]  MRN:  [id number] Reason for Consult:  Flat affect, refusal to eat, and medication management Referring Physician:  Stephania Fragmin, MD Method of visit?: Face to Face      Principal Problem: Acute encephalopathy Diagnosis:  Principal Problem:   Acute encephalopathy Active Problems:   Hypoglycemia   Constipation   Bipolar disorder (HCC)   Diabetes (HCC)   AMS (altered mental status)   Poor appetite  Subjective: [name] is a 55 y.o. female patient admitted for evaluation of acute mental status change and consulted to the psychiatry service for potential medication management in light of flat affect and refusal to eat.  She was started on haldol  IV BID yesterday; attempted to have feeding tube placed, although patient refused. Today patient appears to be presenting somewhat similiarly to previous days. She denies SI/HI/AVH and declines to answer most questions. When I introduced myself as the psychiatrist she replies that she does not need a psychiatrist. Discussed her refusal to eat or drink- she states "I am not hungry". When asked about her refusal of medications she states "I don't need no medicines". Patient states that her sister visited yesterday and that the visit went well. Based off of chart review, it appears that there is some improvement in her mood and behavior with initiation of haldol.  Total Time spent with patient: 15 minutes  Past Psychiatric History: Bipolar disorder, depressive disorder-unspecified (2006), IDD Inpatient admission- [facility] (November 2005), [facility] (2006 and 2009) Outpatient: [facility] during the Fall of 2005 and early 2006  Past Medical History:  Past Medical History:  Diagnosis Date   Diabetes mellitus without complication (HCC)    Hypertension    Immune deficiency disorder (HCC)    Mild mental retardation    History reviewed. No pertinent  surgical history. Family History:  Family History  Problem Relation Age of Onset   Hypertension Other    Family Psychiatric  History: Unknown Social History:  Social History   Substance and Sexual Activity  Alcohol Use No     Social History   Substance and Sexual Activity  Drug Use Never    Social History   Socioeconomic History   Marital status: Single    Spouse name: Not on file   Number of children: Not on file   Years of education: Not on file   Highest education level: Not on file  Occupational History   Not on file  Tobacco Use   Smoking status: Never   Smokeless tobacco: Never  Substance and Sexual Activity   Alcohol use: No   Drug use: Never   Sexual activity: Never  Other Topics Concern   Not on file  Social History Narrative   Not on file   Social Determinants of Health   Financial Resource Strain: Not on file  Food Insecurity: Not on file  Transportation Needs: Not on file  Physical Activity: Not on file  Stress: Not on file  Social Connections: Not on file    Sleep: Good  Appetite:   Pt refuses to eat, since Friday, 8/5. RD to follow for Cortrak placement.   Current Medications: Current Facility-Administered Medications  Medication Dose Route Frequency Provider Last Rate Last Admin   acetaminophen (TYLENOL) tablet 650 mg  650 mg Oral Q6H PRN John Giovanni, MD       Or   acetaminophen (TYLENOL) suppository 650 mg  650 mg Rectal  Q6H PRN John Giovanniathore, Vasundhra, MD       bisacodyl (DULCOLAX) suppository 10 mg  10 mg Rectal Q1500 Amin, Ankit Chirag, MD   10 mg at 01/25/21 1830   dextromethorphan-guaiFENesin (MUCINEX DM) 30-600 MG per 12 hr tablet 1 tablet  1 tablet Oral BID PRN Amin, Loura HaltAnkit Chirag, MD       dextrose 5 % solution   Intravenous Continuous Burnadette PopAdhikari, Amrit, MD 75 mL/hr at 01/29/21 0715 New Bag at 01/29/21 0715   enoxaparin (LOVENOX) injection 40 mg  40 mg Subcutaneous Q24H John Giovanniathore, Vasundhra, MD   40 mg at 01/28/21 1108   feeding  supplement (OSMOLITE 1.2 CAL) liquid 1,000 mL  1,000 mL Per Tube Continuous Adhikari, Amrit, MD       feeding supplement (PROSource TF) liquid 45 mL  45 mL Per Tube Daily Adhikari, Amrit, MD       free water 100 mL  100 mL Per Tube Q4H Adhikari, Amrit, MD       haloperidol lactate (HALDOL) injection 2 mg  2 mg Intramuscular Q6H PRN Mansy, Jan A, MD       haloperidol lactate (HALDOL) injection 2 mg  2 mg Intravenous Q12H Lamar Sprinklesosby, Courtney, MD   2 mg at 01/28/21 2219   hydrALAZINE (APRESOLINE) injection 10 mg  10 mg Intravenous Q4H PRN Amin, Ankit Chirag, MD       ipratropium-albuterol (DUONEB) 0.5-2.5 (3) MG/3ML nebulizer solution 3 mL  3 mL Nebulization Q4H PRN Amin, Ankit Chirag, MD       levETIRAcetam (KEPPRA) IVPB 1000 mg/100 mL premix  1,000 mg Intravenous Q12H Amin, Loura HaltAnkit Chirag, MD   Stopped at 01/29/21 0640   mirtazapine (REMERON) tablet 7.5 mg  7.5 mg Oral QHS Lamar Sprinklesosby, Courtney, MD       multivitamin with minerals tablet 1 tablet  1 tablet Oral Daily Adhikari, Amrit, MD       polyethylene glycol (MIRALAX / GLYCOLAX) packet 17 g  17 g Oral BID Amin, Ankit Chirag, MD   17 g at 01/26/21 1059   QUEtiapine (SEROQUEL) tablet 200 mg  200 mg Oral QHS Lamar Sprinklesosby, Courtney, MD       senna-docusate (Senokot-S) tablet 1 tablet  1 tablet Oral QHS PRN Amin, Ankit Chirag, MD       sodium chloride flush (NS) 0.9 % injection 3 mL  3 mL Intravenous Once John Giovanniathore, Vasundhra, MD       valACYclovir (VALTREX) tablet 500 mg  500 mg Oral QHS Amin, Ankit Chirag, MD        Lab Results:  Results for orders placed or performed during the hospital encounter of 01/23/21 (from the past 48 hour(s))  Glucose, capillary     Status: Abnormal   Collection Time: 01/27/21 12:02 PM  Result Value Ref Range   Glucose-Capillary 142 (H) 70 - 99 mg/dL    Comment: Glucose reference range applies only to samples taken after fasting for at least 8 hours.  Valproic acid level     Status: Abnormal   Collection Time: 01/27/21  3:55 PM   Result Value Ref Range   Valproic Acid Lvl <10 (L) 50.0 - 100.0 ug/mL    Comment: RESULTS CONFIRMED BY MANUAL DILUTION Performed at [facility], [address] [place], [place] [id number]   Glucose, capillary     Status: Abnormal   Collection Time: 01/27/21  3:55 PM  Result Value Ref Range   Glucose-Capillary 120 (H) 70 - 99 mg/dL    Comment: Glucose reference range  applies only to samples taken after fasting for at least 8 hours.  Glucose, capillary     Status: Abnormal   Collection Time: 01/27/21  8:38 PM  Result Value Ref Range   Glucose-Capillary 115 (H) 70 - 99 mg/dL    Comment: Glucose reference range applies only to samples taken after fasting for at least 8 hours.   Comment 1 Notify RN   Glucose, capillary     Status: Abnormal   Collection Time: 01/28/21 12:12 AM  Result Value Ref Range   Glucose-Capillary 109 (H) 70 - 99 mg/dL    Comment: Glucose reference range applies only to samples taken after fasting for at least 8 hours.  RPR     Status: None   Collection Time: 01/28/21  4:28 AM  Result Value Ref Range   RPR Ser Ql NON REACTIVE NON REACTIVE    Comment: Performed at [facility], [address] [place], [place] [address]  HIV Antibody (routine testing w rflx)     Status: None   Collection Time: 01/28/21  4:28 AM  Result Value Ref Range   HIV Screen 4th Generation wRfx Non Reactive Non Reactive    Comment: Performed at [facility], [address] [place], [place] [address]  Basic metabolic panel     Status: Abnormal   Collection Time: 01/28/21  4:28 AM  Result Value Ref Range   Sodium 138 135 - 145 mmol/L   Potassium 3.9 3.5 - 5.1 mmol/L   Chloride 103 98 - 111 mmol/L   CO2 25 22 - 32 mmol/L   Glucose, Bld 87 70 - 99 mg/dL    Comment: Glucose reference range applies only to samples taken after fasting for at least 8 hours.   BUN <5 (L) 6 - 20 mg/dL   Creatinine, Ser 5.03 0.44 - 1.00 mg/dL   Calcium 9.5 8.9 - 54.6 mg/dL   GFR, Estimated  >56 >81 mL/min    Comment: (NOTE) Calculated using the CKD-EPI Creatinine Equation (2021)    Anion gap 10 5 - 15    Comment: Performed at [facility], [address], [place], [place] [address]  Magnesium     Status: None   Collection Time: 01/28/21  4:28 AM  Result Value Ref Range   Magnesium 2.2 1.7 - 2.4 mg/dL    Comment: Performed at [facility], [address] [place], [place] [address]  Phosphorus     Status: None   Collection Time: 01/28/21  4:28 AM  Result Value Ref Range   Phosphorus 3.3 2.5 - 4.6 mg/dL    Comment: Performed at [facility], [address], [place], [place] [address]  Glucose, capillary     Status: Abnormal   Collection Time: 01/28/21  6:20 AM  Result Value Ref Range   Glucose-Capillary 101 (H) 70 - 99 mg/dL    Comment: Glucose reference range applies only to samples taken after fasting for at least 8 hours.   Comment 1 Notify RN   Glucose, capillary     Status: Abnormal   Collection Time: 01/28/21  8:10 AM  Result Value Ref Range   Glucose-Capillary 103 (H) 70 - 99 mg/dL    Comment: Glucose reference range applies only to samples taken after fasting for at least 8 hours.   Comment 1 Notify RN    Comment 2 Document in Chart   Glucose, capillary     Status: None   Collection  Time: 01/28/21 12:21 PM  Result Value Ref Range   Glucose-Capillary 95 70 - 99 mg/dL    Comment: Glucose reference range applies only to samples taken after fasting for at least 8 hours.   Comment 1 Notify RN    Comment 2 Document in Chart   Glucose, capillary     Status: Abnormal   Collection Time: 01/28/21  4:57 PM  Result Value Ref Range   Glucose-Capillary 55 (L) 70 - 99 mg/dL    Comment: Glucose reference range applies only to samples taken after fasting for at least 8 hours.  Glucose, capillary     Status: Abnormal   Collection Time: 01/28/21  7:04 PM  Result Value Ref Range   Glucose-Capillary 173 (H) 70 - 99 mg/dL    Comment: Glucose reference  range applies only to samples taken after fasting for at least 8 hours.  Glucose, capillary     Status: Abnormal   Collection Time: 01/28/21  8:48 PM  Result Value Ref Range   Glucose-Capillary 126 (H) 70 - 99 mg/dL    Comment: Glucose reference range applies only to samples taken after fasting for at least 8 hours.   Comment 1 Notify RN   Glucose, capillary     Status: Abnormal   Collection Time: 01/28/21 11:49 PM  Result Value Ref Range   Glucose-Capillary 109 (H) 70 - 99 mg/dL    Comment: Glucose reference range applies only to samples taken after fasting for at least 8 hours.   Comment 1 Notify RN   Glucose, capillary     Status: Abnormal   Collection Time: 01/29/21  4:05 AM  Result Value Ref Range   Glucose-Capillary 120 (H) 70 - 99 mg/dL    Comment: Glucose reference range applies only to samples taken after fasting for at least 8 hours.   Comment 1 Notify RN   Basic metabolic panel     Status: Abnormal   Collection Time: 01/29/21  7:25 AM  Result Value Ref Range   Sodium 137 135 - 145 mmol/L   Potassium 3.8 3.5 - 5.1 mmol/L   Chloride 105 98 - 111 mmol/L   CO2 23 22 - 32 mmol/L   Glucose, Bld 137 (H) 70 - 99 mg/dL    Comment: Glucose reference range applies only to samples taken after fasting for at least 8 hours.   BUN 5 (L) 6 - 20 mg/dL   Creatinine, Ser 7.16 0.44 - 1.00 mg/dL   Calcium 9.2 8.9 - 96.7 mg/dL   GFR, Estimated >89 >38 mL/min    Comment: (NOTE) Calculated using the CKD-EPI Creatinine Equation (2021)    Anion gap 9 5 - 15    Comment: Performed at [facility], [address], [place], [place] [address]  Magnesium     Status: None   Collection Time: 01/29/21  7:25 AM  Result Value Ref Range   Magnesium 2.1 1.7 - 2.4 mg/dL    Comment: Performed at [facility], [address], [place], [place] [address]  Phosphorus     Status: None   Collection Time: 01/29/21  7:25 AM  Result Value Ref Range   Phosphorus 3.9 2.5 - 4.6 mg/dL    Comment:  Performed at [facility], [address], [place], [place] [address]  Glucose, capillary     Status: Abnormal   Collection Time: 01/29/21  7:37 AM  Result Value Ref Range   Glucose-Capillary 139 (H) 70 - 99  mg/dL    Comment: Glucose reference range applies only to samples taken after fasting for at least 8 hours.    Blood Alcohol level:  Lab Results  Component Value Date   ETH <10 10/28/2020   [facility]  03/05/2008    <5        LOWEST DETECTABLE LIMIT FOR SERUM ALCOHOL IS 11 mg/dL FOR MEDICAL PURPOSES ONLY    Physical Findings: Musculoskeletal: Strength & Muscle Tone: within normal limits Gait & Station:  unobserved Patient leans: N/A  Psychiatric Specialty Exam:  Presentation  General Appearance: Appropriate for Environment  Eye Contact:Minimal  Speech:Clear and Coherent  Speech Volume:Normal  Handedness: No data recorded  Mood and Affect  Mood:Irritable  Affect:Congruent   Thought Process  Thought Processes:Goal Directed  Descriptions of Associations:No data recorded Orientation:Partial  Thought Content:No data recorded History of Schizophrenia/Schizoaffective disorder:No data recorded Duration of Psychotic Symptoms:No data recorded Hallucinations:No data recorded  Ideas of Reference:None  Suicidal Thoughts:No data recorded  Homicidal Thoughts:No data recorded   Sensorium  Memory:-- (Unable to assess (UTA))  Judgment:Poor  Insight:Lacking   Executive Functions  Concentration:Poor  Attention Span:Poor  Recall:Poor  Fund of Knowledge:Poor  Language:Poor   Psychomotor Activity  Psychomotor Activity:No data recorded   Assets  Assets:Social Support   Sleep  Sleep:No data recorded   Physical Exam: Physical Exam Vitals reviewed.  HENT:     Head: Normocephalic and atraumatic.  Eyes:     Extraocular Movements: Extraocular movements intact.  Cardiovascular:     Rate and Rhythm: Normal rate.  Pulmonary:     Effort:  Pulmonary effort is normal.  Neurological:     Mental Status: She is alert and oriented to person, place, and time.  Psychiatric:        Behavior: Behavior is uncooperative.        Thought Content: Thought content is not delusional.   Review of Systems  Unable to perform ROS: Psychiatric disorder (patient declined to answer most questions)  Psychiatric/Behavioral:  Negative for depression, hallucinations and suicidal ideas. The patient does not have insomnia.   Able to obtain limited ROS. Blood pressure 101/74, pulse 76, temperature 97.9 F (36.6 C), temperature source Oral, resp. rate 16, SpO2 100 %. There is no height or weight on file to calculate BMI.  Treatment Plan Summary: Analuisa remains inappropriate and immature in responses and still refuses all PO intake. Her sister describes her current presentation being completely different from her usual. At this time, there is still no clear etiology of her presentation.Patient started on haldol 2 mg IV BID yesterday although declines PO intake of food, liquid and medications. Feeding tube attempted to be placed yesterday, but was unable to as patient became combative.    Ddx: Acute Psychosis 2/2 Bipolar disorder with psychotic features vs. MDD- recurrent/severe/with psychotic features vs. IDD symptomology -RPR and HIV neg -Ordered Seroquel 200 mg qHS and Remeron 7.5 mg qHS but patient refusing PO. -Continue Haldol 2 mg IV BID-based off of documentation, appears there is some improvement, can consider increasing dose-although prior to increasing dose to 3 mg IV BID but  would recommend obtaining new EKG  -last ekg 8/7 with qtc 444  -recommendations communicated to primary team via epic secure chat  Disposition: Psychiatry will continue to follow.   Estella Husk, MD Attending psychiatrist 01/29/2021, 10:18 AM

## 2021-01-29 NOTE — Plan of Care (Signed)

## 2021-01-30 LAB — MAGNESIUM: Magnesium: 2.1 mg/dL (ref 1.7–2.4)

## 2021-01-30 LAB — GLUCOSE, CAPILLARY
Glucose-Capillary: 130 mg/dL — ABNORMAL HIGH (ref 70–99)
Glucose-Capillary: 141 mg/dL — ABNORMAL HIGH (ref 70–99)
Glucose-Capillary: 157 mg/dL — ABNORMAL HIGH (ref 70–99)
Glucose-Capillary: 134 mg/dL — ABNORMAL HIGH (ref 70–99)
Glucose-Capillary: 102 mg/dL — ABNORMAL HIGH (ref 70–99)

## 2021-01-30 LAB — PHOSPHORUS: Phosphorus: 3.5 mg/dL (ref 2.5–4.6)

## 2021-01-30 NOTE — Plan of Care (Signed)

## 2021-01-30 NOTE — Progress Notes (Signed)
PROGRESS NOTE    [name]  JKD:[id number] DOB: 10-Nov-1965 DOA: 01/23/2021 PCP: Mirna Mires, MD   Chief Complain: Altered mental status  Brief Narrative: Patient is a 55 year old female with history of intellectual disability, bipolar disorder, seizure disorder, diabetes type 2, hypertension, hyperlipidemia, immunodeficiency disorder who presented to the emergency department from home for evaluation of altered mental status.  CT head was negative on admission.  Abdominal x-ray showed moderate stool burden and patient was confused.  MRI of the brain, EEG and other neurological work-up was negative.  She continues to have poor oral intake, lack of desire to eat/drink with flat affect.  Psychiatry consulted and following.  Started on Remeron,Seroquel. She declined feeding tube placement. Waiting for dietician/psychiatric recommendation before discharge planning.  Assessment & Plan:   Principal Problem:   Acute encephalopathy Active Problems:   Hypoglycemia   Constipation   Bipolar disorder (HCC)   Diabetes (HCC)   AMS (altered mental status)   Poor appetite   Acute encephalopathy/flat affect: Unclear etiology.  No focal neurological deficits.  Brain imagings negative for acute intracranial abnormalities.  EEG did not show any seizures.  UA negative, UDS negative.  Procalcitonin negative ruling out infectious process.  Vitamin B12, ammonia, TSH normal.  Psychiatry consulted and following.  Currently on Remeron, Seroquel.  She still is not coherent and does not participate with any meaningful conversation but is alert and oriented.  Psychiatry recommending to follow-up daily before final recommendation.  History of seizure: Continue Keppra 1 g twice daily, converted to IV due to poor oral intake.  EEG did not show any seizure or epileptiform discharge.  Neurology was following.  Bipolar disorder: On risperidone, trazodone at home.  Diabetes /poor oral intake: Intermittently  hypoglycemic. Was on D5, now discontinued because blood sugars are stable.  Nutrition consulted.  Very poor oral intake.  Takes Amaryl, metformin, Lantus, sliding scale at home.  Hemoglobin A1c of 6.4.  Dietitian recommending putting core track because heroral intake remains poor.  Patient declined core track placement  Hypotension: Blood pressure soft.  Continue to monitor.  Hypokalemia/hypomagnesemia: Supplemented with potassium/magnesium and being monitored  Nutrition Problem: Inadequate oral intake Etiology: lethargy/confusion      DVT prophylaxis:Lovenox Code Status: Full Family Communication: Called and discussed with sister on phone on 01/27/21 Status is: Inpatient  Remains inpatient appropriate because:Unsafe d/c plan  Dispo: The patient is from: Home              Anticipated d/c is to: Home              Patient currently is not medically stable to d/c.   Difficult to place patient No    Consultants: Psychiatry, neurology  Procedures: None  Antimicrobials:  Anti-infectives (From admission, onward)    Start     Dose/Rate Route Frequency Ordered Stop   01/25/21 2200  valACYclovir (VALTREX) tablet 500 mg        500 mg Oral Daily at bedtime 01/25/21 0930         Subjective:  Patient seen and examined at bedside this morning.  Same as yesterday.  Covering her face with the bedsheet.  Says she does not eat any food  Objective: Vitals:   01/29/21 2337 01/30/21 0310 01/30/21 0426 01/30/21 0731  BP: (!) 106/59 (!) 96/45  (!) 147/127  Pulse: 78   76  Resp: 16 18  18   Temp: 98.2 F (36.8 C) 97.8 F (36.6 C)  97.6 F (36.4 C)  TempSrc:  Oral Oral    SpO2: 99%   99%  Weight:   71.6 kg     Intake/Output Summary (Last 24 hours) at 01/30/2021 1121 Last data filed at 01/30/2021 0606 Gross per 24 hour  Intake 2385.63 ml  Output 0 ml  Net 2385.63 ml   Filed Weights   01/30/21 0426  Weight: 71.6 kg    Examination:  General exam: Overall comfortable, not in  distress HEENT: PERRL Respiratory system:  no wheezes or crackles  Cardiovascular system: S1 & S2 heard, RRR.  Gastrointestinal system: Abdomen is nondistended, soft and nontender. Central nervous system: Alert and awake Extremities: No edema, no clubbing ,no cyanosis Skin: No rashes, no ulcers,no icterus     Data Reviewed: I have personally reviewed following labs and imaging studies  CBC: Recent Labs  Lab 01/23/21 1905 01/23/21 2004 01/25/21 0438 01/26/21 0318 01/27/21 0151  WBC 9.2  --  7.1 7.3 7.2  NEUTROABS 4.7  --   --   --   --   HGB 13.7 14.6 13.0 12.3 12.5  HCT 42.1 43.0 39.2 37.3 37.4  MCV 91.5  --  91.2 89.9 90.3  PLT 442*  --  370 360 338   Basic Metabolic Panel: Recent Labs  Lab 01/25/21 0438 01/26/21 0318 01/26/21 0614 01/27/21 0151 01/28/21 0428 01/29/21 0725 01/30/21 0546  NA 137  --  136 137 138 137  --   K 3.5  --  3.3* 3.2* 3.9 3.8  --   CL 104  --  103 105 103 105  --   CO2 25  --  25 24 25 23   --   GLUCOSE 145*  --  146* 156* 87 137*  --   BUN 5*  --  <5* <5* <5* 5*  --   CREATININE 0.70  --  0.77 0.76 0.85 0.80  --   CALCIUM 9.4  --  9.6 9.4 9.5 9.2  --   MG 1.8 1.7  --  1.6* 2.2 2.1 2.1  PHOS  --   --   --   --  3.3 3.9 3.5   GFR: Estimated Creatinine Clearance: 71.9 mL/min (by C-G formula based on SCr of 0.8 mg/dL). Liver Function Tests: Recent Labs  Lab 01/23/21 1905  AST 39  ALT 28  ALKPHOS 75  BILITOT 0.9  PROT 8.1  ALBUMIN 3.9   Recent Labs  Lab 01/23/21 1905  LIPASE 28   Recent Labs  Lab 01/24/21 0704  AMMONIA 24   Coagulation Profile: Recent Labs  Lab 01/23/21 1905  INR 0.9   Cardiac Enzymes: No results for input(s): CKTOTAL, CKMB, CKMBINDEX, TROPONINI in the last 168 hours. BNP (last 3 results) No results for input(s): PROBNP in the last 8760 hours. HbA1C: No results for input(s): HGBA1C in the last 72 hours. CBG: Recent Labs  Lab 01/29/21 1540 01/29/21 2001 01/29/21 2338 01/30/21 0311  01/30/21 0814  GLUCAP 124* 132* 122* 130* 141*   Lipid Profile: No results for input(s): CHOL, HDL, LDLCALC, TRIG, CHOLHDL, LDLDIRECT in the last 72 hours. Thyroid Function Tests: No results for input(s): TSH, T4TOTAL, FREET4, T3FREE, THYROIDAB in the last 72 hours. Anemia Panel: No results for input(s): VITAMINB12, FOLATE, FERRITIN, TIBC, IRON, RETICCTPCT in the last 72 hours. Sepsis Labs: Recent Labs  Lab 01/25/21 1018  PROCALCITON <0.10    Recent Results (from the past 240 hour(s))  SARS CORONAVIRUS 2 (TAT 6-24 HRS) Nasopharyngeal Nasopharyngeal Swab     Status: None   Collection Time:  01/24/21 12:23 AM   Specimen: Nasopharyngeal Swab  Result Value Ref Range Status   SARS Coronavirus 2 NEGATIVE NEGATIVE Final    Comment: (NOTE) SARS-CoV-2 target nucleic acids are NOT DETECTED.  The SARS-CoV-2 RNA is generally detectable in upper and lower respiratory specimens during the acute phase of infection. Negative results do not preclude SARS-CoV-2 infection, do not rule out co-infections with other pathogens, and should not be used as the sole basis for treatment or other patient management decisions. Negative results must be combined with clinical observations, patient history, and epidemiological information. The expected result is Negative.  Fact Sheet for Patients: HairSlick.no  Fact Sheet for Healthcare Providers: quierodirigir.com  This test is not yet approved or cleared by the [place] FDA and  has been authorized for detection and/or diagnosis of SARS-CoV-2 by FDA under an Emergency Use Authorization (EUA). This EUA will remain  in effect (meaning this test can be used) for the duration of the COVID-19 declaration under Se ction 564(b)(1) of the Act, 21 U.S.C. section 360bbb-3(b)(1), unless the authorization is terminated or revoked sooner.  Performed at [facility], [address], [place],  [place] [address]          Radiology Studies: No results found.      Scheduled Meds:  bisacodyl  10 mg Rectal Q1500   enoxaparin (LOVENOX) injection  40 mg Subcutaneous Q24H   feeding supplement (PROSource TF)  45 mL Per Tube Daily   free water  100 mL Per Tube Q4H   haloperidol lactate  2 mg Intravenous Q12H   mirtazapine  7.5 mg Oral QHS   multivitamin with minerals  1 tablet Oral Daily   polyethylene glycol  17 g Oral BID   QUEtiapine  200 mg Oral QHS   sodium chloride flush  3 mL Intravenous Once   valACYclovir  500 mg Oral QHS   Continuous Infusions:  dextrose 75 mL/hr at 01/30/21 0606   feeding supplement (OSMOLITE 1.2 CAL)     levETIRAcetam 1,000 mg (01/30/21 1019)     LOS: 5 days    Time spent: 25 mins,More than 50% of that time was spent in counseling and/or coordination of care.      Burnadette Pop, MD [facility] P8/14/2022, 11:21 AM

## 2021-01-31 ENCOUNTER — Inpatient Hospital Stay (HOSPITAL_COMMUNITY): Payer: Medicaid Other

## 2021-01-31 LAB — GLUCOSE, CAPILLARY
Glucose-Capillary: 163 mg/dL — ABNORMAL HIGH (ref 70–99)
Glucose-Capillary: 182 mg/dL — ABNORMAL HIGH (ref 70–99)
Glucose-Capillary: 180 mg/dL — ABNORMAL HIGH (ref 70–99)
Glucose-Capillary: 203 mg/dL — ABNORMAL HIGH (ref 70–99)

## 2021-01-31 MED ORDER — ADULT MULTIVITAMIN W/MINERALS CH
1.0000 | ORAL_TABLET | Freq: Every day | ORAL | Status: DC
  Administered 2021-02-01 – 2021-02-23 (×22): 1
  Filled 2021-01-31 (×23): qty 1

## 2021-01-31 MED ORDER — VALACYCLOVIR HCL 500 MG PO TABS
500.0000 mg | ORAL_TABLET | Freq: Every day | ORAL | Status: DC
  Administered 2021-01-31 – 2021-02-13 (×14): 500 mg
  Filled 2021-01-31 (×13): qty 1

## 2021-01-31 MED ORDER — ACETAMINOPHEN 325 MG PO TABS
650.0000 mg | ORAL_TABLET | Freq: Four times a day (QID) | ORAL | Status: DC | PRN
  Administered 2021-02-04 – 2021-02-15 (×6): 650 mg
  Filled 2021-01-31 (×6): qty 2

## 2021-01-31 MED ORDER — ACETAMINOPHEN 650 MG RE SUPP: 650.0000 mg | Freq: Four times a day (QID) | RECTAL | Status: DC | PRN

## 2021-01-31 MED ORDER — LORAZEPAM 2 MG/ML IJ SOLN
1.0000 mg | INTRAMUSCULAR | Status: AC
  Administered 2021-01-31: 1 mg via INTRAVENOUS
  Filled 2021-01-31: qty 1

## 2021-01-31 MED ORDER — HALOPERIDOL LACTATE 5 MG/ML IJ SOLN
5.0000 mg | Freq: Two times a day (BID) | INTRAMUSCULAR | Status: DC
  Administered 2021-01-31 – 2021-02-02 (×4): 5 mg via INTRAVENOUS
  Filled 2021-01-31 (×4): qty 1

## 2021-01-31 MED ORDER — DEXTROSE 5 % IV SOLN: INTRAVENOUS | Status: DC

## 2021-01-31 MED ORDER — METOPROLOL TARTRATE 5 MG/5ML IV SOLN
2.5000 mg | Freq: Four times a day (QID) | INTRAVENOUS | Status: DC | PRN
  Administered 2021-01-31 – 2021-02-09 (×3): 2.5 mg via INTRAVENOUS
  Filled 2021-01-31 (×3): qty 5

## 2021-01-31 MED ORDER — SENNOSIDES-DOCUSATE SODIUM 8.6-50 MG PO TABS
1.0000 | ORAL_TABLET | Freq: Every evening | ORAL | Status: DC | PRN
  Administered 2021-01-31: 1
  Filled 2021-01-31: qty 1

## 2021-01-31 MED ORDER — MIRTAZAPINE 15 MG PO TABS
7.5000 mg | ORAL_TABLET | Freq: Every day | ORAL | Status: DC
  Administered 2021-01-31 – 2021-02-13 (×14): 7.5 mg
  Filled 2021-01-31 (×13): qty 1

## 2021-01-31 MED ORDER — QUETIAPINE FUMARATE 200 MG PO TABS
200.0000 mg | ORAL_TABLET | Freq: Every day | ORAL | Status: DC
  Administered 2021-01-31: 200 mg

## 2021-01-31 MED ORDER — POLYETHYLENE GLYCOL 3350 17 G PO PACK
17.0000 g | PACK | Freq: Two times a day (BID) | ORAL | Status: DC
  Administered 2021-01-31 – 2021-02-12 (×21): 17 g
  Filled 2021-01-31 (×21): qty 1

## 2021-01-31 NOTE — Progress Notes (Signed)
Nutrition Follow-up  DOCUMENTATION CODES:  Not applicable  INTERVENTION:  Begin TF via Cortrak: -Initiate Osmolite 1.2 @ 44ml/hr, advance by 67ml/hr Q8H until goal rate of 23ml/hr (1550ml/d) is reached -35ml Prosource TF daily - free water Q4H  At goal, TF will provide 1912 kcals, 97 grams protein, free water ( total free water)  Patient will be at risk for refeeding syndrome once feeding is initiated. Monitor magnesium, potassium, and phosphorus daily for at least 3 days, MD to replete as needed.  NUTRITION DIAGNOSIS:  Inadequate oral intake related to lethargy/confusion as evidenced by meal completion < 25%, per patient/family report. --ongoing  GOAL:  Patient will meet greater than or equal to 90% of their needs -- will address with TF  MONITOR:  PO intake, Supplement acceptance, I & O's  REASON FOR ASSESSMENT:  Consult Enteral/tube feeding initiation and management  ASSESSMENT:  Pt with PMH significant of intellectual disability, bipolar disorder, seizure disorder, type 2 DM, HTN, HLD, immunodeficiency disorder presented to the ED for change in mental status as reported by patient's sister for the past couple of days at home. CT head, MRI brain, EEG, and other neurologic work-up have been negative. Abdominal x-ray showed moderate stool burden. Pt does have h/o constipation and was recommended by GI to take Linzess and Dulcolax, though pt has been noncompliant. Pt has outpatient colonoscopy scheduled.  Pt continues to deny food, but is hemodynamically stable per MD. Cortrak placement attempted on 8/12 but was ultimately unsuccessful. Cortrak successfully placed today, 8/15, with gastric tip per xray.   Medications: dulcolax, remeron, mvi with minerals, miralax Labs reviewed. CBGs [phone]  Diet Order:   Diet Order             Diet regular Room service appropriate? Yes; Fluid consistency: Thin  Diet effective now                  EDUCATION  NEEDS:  Not appropriate for education at this time  Skin:  Skin Assessment: Reviewed RN Assessment  Last BM:  8/12  Height:  Ht Readings from Last 1 Encounters:  10/30/20 5\' 1"  (1.549 m)   Weight:  Wt Readings from Last 1 Encounters:  01/31/21 72.5 kg   BMI:  Body mass index is 30.2 kg/m.  Estimated Nutritional Needs:  Kcal:  1800-2000 Protein:  90-100 grams Fluid:  >1.8L/d    02/02/21, MS, RD, LDN (she/her/hers) RD pager number and weekend/on-call pager number located in Amion.

## 2021-01-31 NOTE — Progress Notes (Signed)
PROGRESS NOTE    [name]  ALP:[id number] DOB: 08-05-1965 DOA: 01/23/2021 PCP: Mirna Mires, MD   Chief Complain: Altered mental status  Brief Narrative: Patient is a 55 year old female with history of intellectual disability, bipolar disorder, seizure disorder, diabetes type 2, hypertension, hyperlipidemia, immunodeficiency disorder who presented to the emergency department from home for evaluation of altered mental status.  CT head was negative on admission.  Abdominal x-ray showed moderate stool burden and patient was confused.  MRI of the brain, EEG and other neurological work-up was negative.  She continues to have poor oral intake, lack of desire to eat/drink with flat affect.  Psychiatry consulted and following.  Started on Remeron,Seroquel. She declined feeding tube placement. Waiting for dietician/psychiatric recommendation before discharge planning.  Assessment & Plan:   Principal Problem:   Acute encephalopathy Active Problems:   Hypoglycemia   Constipation   Bipolar disorder (HCC)   Diabetes (HCC)   AMS (altered mental status)   Poor appetite   Acute encephalopathy/flat affect: Unclear etiology.  No focal neurological deficits.  Brain imagings negative for acute intracranial abnormalities.  EEG did not show any seizures.  UA negative, UDS negative.  Procalcitonin negative ruling out infectious process.  Vitamin B12, ammonia, TSH normal.  Psychiatry consulted and following.  Currently on Remeron, Seroquel.  She still is not coherent and does not participate with any meaningful conversation but is alert and oriented.  Psychiatry recommending to follow-up daily before final recommendation.  History of seizure: Continue Keppra 1 g twice daily, converted to IV due to poor oral intake.  EEG did not show any seizure or epileptiform discharge.  Neurology was following.  Bipolar disorder: On risperidone, trazodone at home.  Diabetes /poor oral intake: Intermittently  hypoglycemic. Was on D5, now discontinued because blood sugars are stable.  Nutrition consulted.  Very poor oral intake.  Takes Amaryl, metformin, Lantus, sliding scale at home.  Hemoglobin A1c of 6.4.  Dietitian recommending putting core track because her oral intake remains poor.  Patient declined core track placement  Hypotension: Blood pressure soft.  Continue to monitor.  Hypokalemia/hypomagnesemia: Supplemented with potassium/magnesium and being monitored intermittently  Nutrition Problem: Inadequate oral intake Etiology: lethargy/confusion      DVT prophylaxis:Lovenox Code Status: Full Family Communication: Called and discussed with sister on phone on 01/31/21 Status is: Inpatient  Remains inpatient appropriate because:Unsafe d/c plan  Dispo: The patient is from: Home              Anticipated d/c is to: Home vs Inpatient psych              Patient currently is not medically stable to d/c.   Difficult to place patient No    Consultants: Psychiatry, neurology  Procedures: None  Antimicrobials:  Anti-infectives (From admission, onward)    Start     Dose/Rate Route Frequency Ordered Stop   01/25/21 2200  valACYclovir (VALTREX) tablet 500 mg        500 mg Oral Daily at bedtime 01/25/21 0930         Subjective:  Patient seen and examined the bedside this morning.  Hemodynamically stable.  Appears comfortable.  Continues to deny to eat food a  Objective: Vitals:   01/31/21 0006 01/31/21 0436 01/31/21 0442 01/31/21 0725  BP: 123/72 93/67  113/63  Pulse: 75 85  72  Resp: 16 16  15   Temp: 98.2 F (36.8 C) 97.8 F (36.6 C)  97.6 F (36.4 C)  TempSrc: Oral Oral  Oral  SpO2: 100% 97%  100%  Weight:   72.5 kg     Intake/Output Summary (Last 24 hours) at 01/31/2021 0742 Last data filed at 01/31/2021 0352 Gross per 24 hour  Intake 1560.37 ml  Output --  Net 1560.37 ml   Filed Weights   01/30/21 0426 01/31/21 0442  Weight: 71.6 kg 72.5 kg     Examination:  General exam: Overall comfortable, not in distress,obese HEENT: PERRL Respiratory system:  no wheezes or crackles  Cardiovascular system: S1 & S2 heard, RRR.  Gastrointestinal system: Abdomen is nondistended, soft and nontender. Central nervous system: Alert and awake  Extremities: No edema, no clubbing ,no cyanosis Skin: No rashes, no ulcers,no icterus     Data Reviewed: I have personally reviewed following labs and imaging studies  CBC: Recent Labs  Lab 01/25/21 0438 01/26/21 0318 01/27/21 0151  WBC 7.1 7.3 7.2  HGB 13.0 12.3 12.5  HCT 39.2 37.3 37.4  MCV 91.2 89.9 90.3  PLT 370 360 338   Basic Metabolic Panel: Recent Labs  Lab 01/25/21 0438 01/26/21 0318 01/26/21 0614 01/27/21 0151 01/28/21 0428 01/29/21 0725 01/30/21 0546  NA 137  --  136 137 138 137  --   K 3.5  --  3.3* 3.2* 3.9 3.8  --   CL 104  --  103 105 103 105  --   CO2 25  --  25 24 25 23   --   GLUCOSE 145*  --  146* 156* 87 137*  --   BUN 5*  --  <5* <5* <5* 5*  --   CREATININE 0.70  --  0.77 0.76 0.85 0.80  --   CALCIUM 9.4  --  9.6 9.4 9.5 9.2  --   MG 1.8 1.7  --  1.6* 2.2 2.1 2.1  PHOS  --   --   --   --  3.3 3.9 3.5   GFR: Estimated Creatinine Clearance: 72.4 mL/min (by C-G formula based on SCr of 0.8 mg/dL). Liver Function Tests: No results for input(s): AST, ALT, ALKPHOS, BILITOT, PROT, ALBUMIN in the last 168 hours.  No results for input(s): LIPASE, AMYLASE in the last 168 hours.  No results for input(s): AMMONIA in the last 168 hours.  Coagulation Profile: No results for input(s): INR, PROTIME in the last 168 hours.  Cardiac Enzymes: No results for input(s): CKTOTAL, CKMB, CKMBINDEX, TROPONINI in the last 168 hours. BNP (last 3 results) No results for input(s): PROBNP in the last 8760 hours. HbA1C: No results for input(s): HGBA1C in the last 72 hours. CBG: Recent Labs  Lab 01/30/21 0311 01/30/21 0814 01/30/21 1131 01/30/21 1542 01/30/21 2044  GLUCAP  130* 141* 157* 134* 102*   Lipid Profile: No results for input(s): CHOL, HDL, LDLCALC, TRIG, CHOLHDL, LDLDIRECT in the last 72 hours. Thyroid Function Tests: No results for input(s): TSH, T4TOTAL, FREET4, T3FREE, THYROIDAB in the last 72 hours. Anemia Panel: No results for input(s): VITAMINB12, FOLATE, FERRITIN, TIBC, IRON, RETICCTPCT in the last 72 hours. Sepsis Labs: Recent Labs  Lab 01/25/21 1018  PROCALCITON <0.10    Recent Results (from the past 240 hour(s))  SARS CORONAVIRUS 2 (TAT 6-24 HRS) Nasopharyngeal Nasopharyngeal Swab     Status: None   Collection Time: 01/24/21 12:23 AM   Specimen: Nasopharyngeal Swab  Result Value Ref Range Status   SARS Coronavirus 2 NEGATIVE NEGATIVE Final    Comment: (NOTE) SARS-CoV-2 target nucleic acids are NOT DETECTED.  The SARS-CoV-2 RNA is generally detectable in  upper and lower respiratory specimens during the acute phase of infection. Negative results do not preclude SARS-CoV-2 infection, do not rule out co-infections with other pathogens, and should not be used as the sole basis for treatment or other patient management decisions. Negative results must be combined with clinical observations, patient history, and epidemiological information. The expected result is Negative.  Fact Sheet for Patients: HairSlick.no  Fact Sheet for Healthcare Providers: quierodirigir.com  This test is not yet approved or cleared by the [place] FDA and  has been authorized for detection and/or diagnosis of SARS-CoV-2 by FDA under an Emergency Use Authorization (EUA). This EUA will remain  in effect (meaning this test can be used) for the duration of the COVID-19 declaration under Se ction 564(b)(1) of the Act, 21 U.S.C. section 360bbb-3(b)(1), unless the authorization is terminated or revoked sooner.  Performed at [facility], [address] [place], [place] [address]           Radiology Studies: No results found.      Scheduled Meds:  bisacodyl  10 mg Rectal Q1500   enoxaparin (LOVENOX) injection  40 mg Subcutaneous Q24H   feeding supplement (PROSource TF)  45 mL Per Tube Daily   free water  100 mL Per Tube Q4H   haloperidol lactate  2 mg Intravenous Q12H   mirtazapine  7.5 mg Oral QHS   multivitamin with minerals  1 tablet Oral Daily   polyethylene glycol  17 g Oral BID   QUEtiapine  200 mg Oral QHS   sodium chloride flush  3 mL Intravenous Once   valACYclovir  500 mg Oral QHS   Continuous Infusions:  dextrose 75 mL/hr at 01/31/21 0441   feeding supplement (OSMOLITE 1.2 CAL)     levETIRAcetam Stopped (01/30/21 2128)     LOS: 6 days    Time spent: 25 mins,More than 50% of that time was spent in counseling and/or coordination of care.      Burnadette Pop, MD [facility] P8/15/2022, 7:42 AM

## 2021-01-31 NOTE — Procedures (Signed)
Cortrak  Person Inserting Tube:  Jakhai Fant, RD Tube Type:  Cortrak - 43 inches Tube Size:  10 Tube Location:  Right nare Initial Placement:  Stomach Secured by: Bridle Technique Used to Measure Tube Placement:  Marking at nare/corner of mouth Cortrak Secured At:  60 cm Procedure Comments:  Cortrak Tube Team Note:  Consult received to place a Cortrak feeding tube.   X-ray is required, abdominal x-ray has been ordered by the Cortrak team. Please confirm tube placement before using the Cortrak tube.   If the tube becomes dislodged please keep the tube and contact the Cortrak team at [email or web address] (password [email or web address]) for replacement.  If after hours and replacement cannot be delayed, place a NG tube and confirm placement with an abdominal x-ray.    Judieth Mckown MS, RD, LDN, CNSC Clinical Nutrition Pager listed in AMION    

## 2021-01-31 NOTE — Consult Note (Addendum)
[facility] Psych Consult Progress Note  01/31/2021 8:37 PM [name]  MRN:  [id number] Reason for Consult:  Flat affect, refusal to eat, and medication management Referring Physician:  Stephania Fragmin, MD Method of visit?: Face to Face     Principal Problem: Acute encephalopathy Diagnosis:  Principal Problem:   Acute encephalopathy Active Problems:   Hypoglycemia   Constipation   Bipolar disorder (HCC)   Diabetes (HCC)   AMS (altered mental status)   Poor appetite  Subjective: [name] is a 55 year old female admitted for evaluation of acute mental status change and consulted to the psychiatry service for potential medication management in light of flat affect and refusal to eat. Her assessment was unchanged from assessment on 8/13. She was given the loading dose of Depakote 1500 mg IV and Haldol 2 mg IV BID, and she denies side effects. She still refuses PO intake and uses the same child-like voice to answer questions. She continues to deny SI/HI/AVH.  Total Time spent with patient: 15 minutes  Past Psychiatric History: Bipolar disorder, depressive disorder-unspecified (2006), IDD Inpatient admission- [facility] (November 2005), [facility] (2006 and 2009) Outpatient: [facility] during the Fall of 2005 and early 2006  Past Medical History:  Past Medical History:  Diagnosis Date   Diabetes mellitus without complication (HCC)    Hypertension    Immune deficiency disorder (HCC)    Mild mental retardation    History reviewed. No pertinent surgical history. Family History:  Family History  Problem Relation Age of Onset   Hypertension Other    Family Psychiatric  History: Unknown Social History:  Social History   Substance and Sexual Activity  Alcohol Use No     Social History   Substance and Sexual Activity  Drug Use Never    Social History   Socioeconomic History   Marital status: Single    Spouse name: Not on file   Number of children:  Not on file   Years of education: Not on file   Highest education level: Not on file  Occupational History   Not on file  Tobacco Use   Smoking status: Never   Smokeless tobacco: Never  Substance and Sexual Activity   Alcohol use: No   Drug use: Never   Sexual activity: Never  Other Topics Concern   Not on file  Social History Narrative   Not on file   Social Determinants of Health   Financial Resource Strain: Not on file  Food Insecurity: Not on file  Transportation Needs: Not on file  Physical Activity: Not on file  Stress: Not on file  Social Connections: Not on file    Sleep: Good  Appetite:   Pt continues to refuse PO intake. RD putting in Cortrak while rounding on patient.   Current Medications: Current Facility-Administered Medications  Medication Dose Route Frequency Provider Last Rate Last Admin   acetaminophen (TYLENOL) tablet 650 mg  650 mg Oral Q6H PRN John Giovanni, MD       Or   acetaminophen (TYLENOL) suppository 650 mg  650 mg Rectal Q6H PRN John Giovanni, MD       bisacodyl (DULCOLAX) suppository 10 mg  10 mg Rectal Q1500 Amin, Ankit Chirag, MD   10 mg at 01/25/21 1830   dextromethorphan-guaiFENesin (MUCINEX DM) 30-600 MG per 12 hr tablet 1 tablet  1 tablet Oral BID PRN Amin, Ankit Chirag, MD       dextrose 5 % solution   Intravenous Continuous  Burnadette Pop, MD 75 mL/hr at 01/31/21 1243 New Bag at 01/31/21 1243   enoxaparin (LOVENOX) injection 40 mg  40 mg Subcutaneous Q24H John Giovanni, MD   40 mg at 01/29/21 1207   feeding supplement (OSMOLITE 1.2 CAL) liquid 1,000 mL  1,000 mL Per Tube Continuous Burnadette Pop, MD 25 mL/hr at 01/31/21 1651 1,000 mL at 01/31/21 1651   feeding supplement (PROSource TF) liquid 45 mL  45 mL Per Tube Daily Burnadette Pop, MD       free water 100 mL  100 mL Per Tube Q4H Burnadette Pop, MD   100 mL at 01/31/21 1652   haloperidol lactate (HALDOL) injection 2 mg  2 mg Intramuscular Q6H PRN Mansy, Jan A, MD    2 mg at 01/31/21 1441   haloperidol lactate (HALDOL) injection 5 mg  5 mg Intravenous Q12H Lamar Sprinkles, MD       hydrALAZINE (APRESOLINE) injection 10 mg  10 mg Intravenous Q4H PRN Amin, Ankit Chirag, MD       ipratropium-albuterol (DUONEB) 0.5-2.5 (3) MG/3ML nebulizer solution 3 mL  3 mL Nebulization Q4H PRN Amin, Ankit Chirag, MD       levETIRAcetam (KEPPRA) IVPB 1000 mg/100 mL premix  1,000 mg Intravenous Q12H Amin, Ankit Chirag, MD 400 mL/hr at 01/31/21 1103 1,000 mg at 01/31/21 1103   mirtazapine (REMERON) tablet 7.5 mg  7.5 mg Oral QHS Lamar Sprinkles, MD       multivitamin with minerals tablet 1 tablet  1 tablet Oral Daily Adhikari, Amrit, MD       polyethylene glycol (MIRALAX / GLYCOLAX) packet 17 g  17 g Oral BID Amin, Ankit Chirag, MD   17 g at 01/26/21 1059   QUEtiapine (SEROQUEL) tablet 200 mg  200 mg Oral QHS Lamar Sprinkles, MD       senna-docusate (Senokot-S) tablet 1 tablet  1 tablet Oral QHS PRN Amin, Ankit Chirag, MD       sodium chloride flush (NS) 0.9 % injection 3 mL  3 mL Intravenous Once John Giovanni, MD       valACYclovir (VALTREX) tablet 500 mg  500 mg Oral QHS Amin, Ankit Chirag, MD        Lab Results:  Results for orders placed or performed during the hospital encounter of 01/23/21 (from the past 48 hour(s))  Glucose, capillary     Status: Abnormal   Collection Time: 01/29/21 11:38 PM  Result Value Ref Range   Glucose-Capillary 122 (H) 70 - 99 mg/dL    Comment: Glucose reference range applies only to samples taken after fasting for at least 8 hours.  Glucose, capillary     Status: Abnormal   Collection Time: 01/30/21  3:11 AM  Result Value Ref Range   Glucose-Capillary 130 (H) 70 - 99 mg/dL    Comment: Glucose reference range applies only to samples taken after fasting for at least 8 hours.  Phosphorus     Status: None   Collection Time: 01/30/21  5:46 AM  Result Value Ref Range   Phosphorus 3.5 2.5 - 4.6 mg/dL    Comment: Performed at [facility], [address], [place], [place] [address]  Magnesium     Status: None   Collection Time: 01/30/21  5:46 AM  Result Value Ref Range   Magnesium 2.1 1.7 - 2.4 mg/dL    Comment: Performed at [facility], [address], [place], [place] [address]  Glucose, capillary     Status: Abnormal  Collection Time: 01/30/21  8:14 AM  Result Value Ref Range   Glucose-Capillary 141 (H) 70 - 99 mg/dL    Comment: Glucose reference range applies only to samples taken after fasting for at least 8 hours.  Glucose, capillary     Status: Abnormal   Collection Time: 01/30/21 11:31 AM  Result Value Ref Range   Glucose-Capillary 157 (H) 70 - 99 mg/dL    Comment: Glucose reference range applies only to samples taken after fasting for at least 8 hours.  Glucose, capillary     Status: Abnormal   Collection Time: 01/30/21  3:42 PM  Result Value Ref Range   Glucose-Capillary 134 (H) 70 - 99 mg/dL    Comment: Glucose reference range applies only to samples taken after fasting for at least 8 hours.  Glucose, capillary     Status: Abnormal   Collection Time: 01/30/21  8:44 PM  Result Value Ref Range   Glucose-Capillary 102 (H) 70 - 99 mg/dL    Comment: Glucose reference range applies only to samples taken after fasting for at least 8 hours.   Comment 1 Notify RN    Comment 2 Document in Chart   Glucose, capillary     Status: Abnormal   Collection Time: 01/31/21  7:47 AM  Result Value Ref Range   Glucose-Capillary 163 (H) 70 - 99 mg/dL    Comment: Glucose reference range applies only to samples taken after fasting for at least 8 hours.  Glucose, capillary     Status: Abnormal   Collection Time: 01/31/21 11:54 AM  Result Value Ref Range   Glucose-Capillary 182 (H) 70 - 99 mg/dL    Comment: Glucose reference range applies only to samples taken after fasting for at least 8 hours.   Comment 1 Notify RN    Comment 2 Document in Chart   Glucose, capillary     Status: Abnormal   Collection Time:  01/31/21  3:51 PM  Result Value Ref Range   Glucose-Capillary 180 (H) 70 - 99 mg/dL    Comment: Glucose reference range applies only to samples taken after fasting for at least 8 hours.    Blood Alcohol level:  Lab Results  Component Value Date   ETH <10 10/28/2020   [facility]    [facility]5        LOWEST DETECTABLE LIMIT FOR SERUM ALCOHOL IS 11 mg/dL FOR MEDICAL PURPOSES ONLY    Physical Findings: AIMS:  , ,  ,  ,    CIWA:    COWS:     Musculoskeletal: Strength & Muscle Tone: within normal limits Gait & Station:  Unobserved; patient lying in bed. Patient leans: N/A  Psychiatric Specialty Exam:  Presentation  General Appearance: Appropriate for Environment  Eye Contact:Minimal  Speech:Clear and Coherent  Speech Volume:Normal  Handedness: No data recorded  Mood and Affect  Mood:Irritable  Affect:Congruent   Thought Process  Thought Processes:Goal Directed  Descriptions of Associations:Intact  Orientation:Full (Time, Place and Person)  Thought Content:No data recorded History of Schizophrenia/Schizoaffective disorder:No data recorded Duration of Psychotic Symptoms:No data recorded Hallucinations:Hallucinations: None  Ideas of Reference:None  Suicidal Thoughts:Suicidal Thoughts: No  Homicidal Thoughts:Homicidal Thoughts: No   Sensorium  Memory:-- (Unable to assess (UTA))  Judgment:Poor  Insight:Lacking   Executive Functions  Concentration:Fair  Attention Span:Fair  Recall:Poor  Fund of Knowledge:Poor  Language:Poor   Psychomotor Activity  Psychomotor Activity:Psychomotor Activity: Normal   Assets  Assets:Social Support; Housing   Sleep  Sleep:Sleep: Good  Physical Exam: Physical Exam Vitals reviewed.  HENT:     Head: Normocephalic and atraumatic.  Eyes:     Extraocular Movements: Extraocular movements intact.  Cardiovascular:     Rate and Rhythm: Normal rate.  Pulmonary:     Effort: Pulmonary effort is normal.   Neurological:     Mental Status: She is alert and oriented to person, place, and time.  Psychiatric:        Attention and Perception: She does not perceive auditory or visual hallucinations.        Mood and Affect: Mood is irritable. Affect is blunt and inappropriate.        Behavior: Behavior is uncooperative.        Thought Content: Thought content is delusional. Thought content does not include homicidal or suicidal ideation.    Review of Systems  Unable to perform ROS: Psychiatric disorder  Psychiatric/Behavioral:  Negative for depression, hallucinations and suicidal ideas. The patient does not have insomnia.   Able to obtain limited ROS. Blood pressure (!) 139/92, pulse (!) 107, temperature 99.6 F (37.6 C), temperature source Oral, resp. rate 20, weight 72.5 kg, SpO2 98 %. Body mass index is 30.2 kg/m.  Treatment Plan Summary: November remains inappropriate and immature in responses and still refuses all PO intake. There is still no clear etiology of her presentation. After Depakote loading dose and Haldol 2 mg IV BID, her presentation remains unchanged.    Ddx: IDD symptomology vs. Bipolar disorder with psychotic features vs. MDD- recurrent/severe/with psychotic features -Increase Haldol to 5 mg IV BID. EKG from today is a scan from 8/7; Qtc 444. Awaiting new EKG. -Start Cogentin 1 mg IV daily. -Cortrak placed today by RD.   Disposition: Psychiatry will continue to follow.  Lamar Sprinkles, MD 01/31/2021, 8:37 PM

## 2021-02-01 LAB — GLUCOSE, CAPILLARY
Glucose-Capillary: 198 mg/dL — ABNORMAL HIGH (ref 70–99)
Glucose-Capillary: 238 mg/dL — ABNORMAL HIGH (ref 70–99)
Glucose-Capillary: 240 mg/dL — ABNORMAL HIGH (ref 70–99)
Glucose-Capillary: 246 mg/dL — ABNORMAL HIGH (ref 70–99)
Glucose-Capillary: 277 mg/dL — ABNORMAL HIGH (ref 70–99)
Glucose-Capillary: 290 mg/dL — ABNORMAL HIGH (ref 70–99)

## 2021-02-01 LAB — BASIC METABOLIC PANEL
Anion gap: 10 (ref 5–15)
BUN: <5 mg/dL — ABNORMAL LOW (ref 6–20)
CO2: 26 mmol/L (ref 22–32)
Calcium: 9.8 mg/dL (ref 8.9–10.3)
Chloride: 104 mmol/L (ref 98–111)
Creatinine, Ser: 0.67 mg/dL (ref 0.44–1.00)
GFR, Estimated: >60 mL/min (ref >60)
Glucose, Bld: 206 mg/dL — ABNORMAL HIGH (ref 70–99)
Potassium: 3.4 mmol/L — ABNORMAL LOW (ref 3.5–5.1)
Sodium: 140 mmol/L (ref 135–145)

## 2021-02-01 LAB — PHOSPHORUS: Phosphorus: 3.7 mg/dL (ref 2.5–4.6)

## 2021-02-01 LAB — MAGNESIUM: Magnesium: 2 mg/dL (ref 1.7–2.4)

## 2021-02-01 MED ORDER — BENZTROPINE MESYLATE 1 MG/ML IJ SOLN: 1.0000 mg | Freq: Every day | INTRAMUSCULAR | Status: DC

## 2021-02-01 MED ORDER — LEVETIRACETAM 100 MG/ML PO SOLN
1000.0000 mg | Freq: Two times a day (BID) | ORAL | Status: DC
  Administered 2021-02-01 – 2021-02-13 (×25): 1000 mg
  Filled 2021-02-01 (×25): qty 10

## 2021-02-01 MED ORDER — BENZTROPINE MESYLATE 1 MG PO TABS: 1.0000 mg | ORAL_TABLET | Freq: Every day | ORAL | Status: DC

## 2021-02-01 MED ORDER — INSULIN ASPART 100 UNIT/ML IJ SOLN
0.0000 [IU] | INTRAMUSCULAR | Status: DC
  Administered 2021-02-01: 3 [IU] via SUBCUTANEOUS
  Administered 2021-02-01 (×2): 5 [IU] via SUBCUTANEOUS
  Administered 2021-02-02: 2 [IU] via SUBCUTANEOUS
  Administered 2021-02-02: 5 [IU] via SUBCUTANEOUS
  Administered 2021-02-02: 2 [IU] via SUBCUTANEOUS
  Administered 2021-02-02 (×2): 3 [IU] via SUBCUTANEOUS
  Administered 2021-02-03 (×2): 5 [IU] via SUBCUTANEOUS
  Administered 2021-02-03 (×2): 7 [IU] via SUBCUTANEOUS
  Administered 2021-02-03: 3 [IU] via SUBCUTANEOUS
  Administered 2021-02-03: 5 [IU] via SUBCUTANEOUS
  Administered 2021-02-04 (×4): 3 [IU] via SUBCUTANEOUS
  Administered 2021-02-04 (×2): 7 [IU] via SUBCUTANEOUS
  Administered 2021-02-05: 5 [IU] via SUBCUTANEOUS
  Administered 2021-02-05: 3 [IU] via SUBCUTANEOUS
  Administered 2021-02-05: 2 [IU] via SUBCUTANEOUS
  Administered 2021-02-05: 5 [IU] via SUBCUTANEOUS
  Administered 2021-02-05 (×2): 2 [IU] via SUBCUTANEOUS
  Administered 2021-02-06: 1 [IU] via SUBCUTANEOUS
  Administered 2021-02-06: 3 [IU] via SUBCUTANEOUS
  Administered 2021-02-06: 5 [IU] via SUBCUTANEOUS
  Administered 2021-02-06 (×3): 2 [IU] via SUBCUTANEOUS
  Administered 2021-02-07: 1 [IU] via SUBCUTANEOUS
  Administered 2021-02-07 – 2021-02-08 (×7): 3 [IU] via SUBCUTANEOUS
  Administered 2021-02-08: 5 [IU] via SUBCUTANEOUS
  Administered 2021-02-08 (×2): 2 [IU] via SUBCUTANEOUS
  Administered 2021-02-08: 1 [IU] via SUBCUTANEOUS
  Administered 2021-02-09: 2 [IU] via SUBCUTANEOUS
  Administered 2021-02-09: 3 [IU] via SUBCUTANEOUS
  Administered 2021-02-09: 2 [IU] via SUBCUTANEOUS

## 2021-02-01 MED ORDER — INSULIN ASPART 100 UNIT/ML IJ SOLN
0.0000 [IU] | Freq: Three times a day (TID) | INTRAMUSCULAR | Status: DC
  Administered 2021-02-01: 3 [IU] via SUBCUTANEOUS

## 2021-02-01 MED ORDER — POTASSIUM CHLORIDE 20 MEQ PO PACK
40.0000 meq | PACK | Freq: Once | ORAL | Status: AC
  Administered 2021-02-01: 40 meq
  Filled 2021-02-01: qty 2

## 2021-02-01 MED ORDER — INSULIN ASPART 100 UNIT/ML IJ SOLN
0.0000 [IU] | Freq: Every day | INTRAMUSCULAR | Status: DC
  Administered 2021-02-01: 3 [IU] via SUBCUTANEOUS
  Administered 2021-02-03: 4 [IU] via SUBCUTANEOUS
  Administered 2021-02-04: 2 [IU] via SUBCUTANEOUS

## 2021-02-01 MED ORDER — BENZTROPINE MESYLATE 1 MG/ML IJ SOLN
1.0000 mg | Freq: Every day | INTRAMUSCULAR | Status: DC
  Administered 2021-02-01 – 2021-02-09 (×9): 1 mg via INTRAVENOUS
  Filled 2021-02-01 (×10): qty 1

## 2021-02-01 NOTE — Progress Notes (Signed)
PROGRESS NOTE    [name]  GYI:[id number] DOB: Nov 13, 1965 DOA: 01/23/2021 PCP: Mirna Mires, MD   Chief Complain: Altered mental status  Brief Narrative: Patient is a 55 year old female with history of intellectual disability, bipolar disorder, seizure disorder, diabetes type 2, hypertension, hyperlipidemia, immunodeficiency disorder who presented to the emergency department from home for evaluation of altered mental status.  CT head was negative on admission.  Abdominal x-ray showed moderate stool burden and patient was confused.  MRI of the brain, EEG and other neurological work-up was negative.  She continues to have poor oral intake, lack of desire to eat/drink with flat affect.  Psychiatry consulted and following.  Started on Remeron,Seroquel,haldol. Coretrack placed on 8/15. Dietitian, psychiatry following  Assessment & Plan:   Principal Problem:   Acute encephalopathy Active Problems:   Hypoglycemia   Constipation   Bipolar disorder (HCC)   Diabetes (HCC)   AMS (altered mental status)   Poor appetite   Acute encephalopathy/flat affect: Unclear etiology. Most likely due to underlying psychiatric issue.  No focal neurological deficits.  Brain imagings negative for acute intracranial abnormalities.  EEG did not show any seizures.  UA negative, UDS negative.  Procalcitonin negative ruling out infectious process.  Vitamin B12, ammonia, TSH normal.  Psychiatry consulted and following.  Currently on Remeron, Seroquel.  She still is not coherent and does not participate with any meaningful conversation but is apparently alert and oriented.Started on haldol  IV,IV cogentin.  Psychiatry recommending to follow-up daily .  She might need inpatient psychiatric placement.  History of seizure: Continue Keppra 1 g twice daily, converted to IV due to poor oral intake.  EEG did not show any seizure or epileptiform discharge.  Neurology was following.  Bipolar disorder: On risperidone,  trazodone at home.  Diabetes /poor oral intake: Intermittently hypoglycemic. Was on D5, now discontinued because blood sugars are stable.  Nutrition consulted.  Very poor oral intake.  Takes Amaryl, metformin, Lantus, sliding scale at home.  Hemoglobin A1c of 6.4.  Core track placed. Monitor blood sugars  Hypotension: Blood pressure soft.  Continue to monitor.  Hypokalemia/hypomagnesemia: Supplemented with potassium/magnesium and being monitored intermittently  Nutrition Problem: Inadequate oral intake Etiology: lethargy/confusion      DVT prophylaxis:Lovenox Code Status: Full Family Communication: Called and discussed with sister on phone on 01/31/21 Status is: Inpatient  Remains inpatient appropriate because:Unsafe d/c plan  Dispo: The patient is from: Home              Anticipated d/c is to: Home vs Inpatient psych              Patient currently is not medically stable to d/c.   Difficult to place patient No    Consultants: Psychiatry, neurology  Procedures: None  Antimicrobials:  Anti-infectives (From admission, onward)    Start     Dose/Rate Route Frequency Ordered Stop   01/31/21 2215  valACYclovir (VALTREX) tablet 500 mg        500 mg Per Tube Daily at bedtime 01/31/21 2119     01/25/21 2200  valACYclovir (VALTREX) tablet 500 mg  Status:  Discontinued        500 mg Oral Daily at bedtime 01/25/21 0930 01/31/21 2119       Subjective:  Patient seen and examined the bedside this morning.  Core track was placed.  She was on restraints.  Did not participate with any conversation today.  Objective: Vitals:   01/31/21 2354 02/01/21 0050 02/01/21 3500 02/01/21 9381  BP: (!) 147/79   113/68  Pulse: (!) 117 95  91  Resp: 18   16  Temp: 97.8 F (36.6 C)   98 F (36.7 C)  TempSrc: Oral   Oral  SpO2: 99%   99%  Weight:   70.1 kg     Intake/Output Summary (Last 24 hours) at 02/01/2021 0748 Last data filed at 02/01/2021 0700 Gross per 24 hour  Intake 2373.63 ml   Output --  Net 2373.63 ml   Filed Weights   01/30/21 0426 01/31/21 0442 02/01/21 0432  Weight: 71.6 kg 72.5 kg 70.1 kg    Examination:  General exam: Sleepy/drowsy, on restraints HEENT: PERRL,feeding tube Respiratory system:  no wheezes or crackles  Cardiovascular system: S1 & S2 heard, RRR.  Gastrointestinal system: Abdomen is nondistended, soft and nontender. Central nervous system: Not alert or awake Extremities: No edema, no clubbing ,no cyanosis Skin: No rashes, no ulcers,no icterus     Data Reviewed: I have personally reviewed following labs and imaging studies  CBC: Recent Labs  Lab 01/26/21 0318 01/27/21 0151  WBC 7.3 7.2  HGB 12.3 12.5  HCT 37.3 37.4  MCV 89.9 90.3  PLT 360 338   Basic Metabolic Panel: Recent Labs  Lab 01/26/21 0614 01/27/21 0151 01/28/21 0428 01/29/21 0725 01/30/21 0546 02/01/21 0015  NA 136 137 138 137  --  140  K 3.3* 3.2* 3.9 3.8  --  3.4*  CL 103 105 103 105  --  104  CO2 25 24 25 23   --  26  GLUCOSE 146* 156* 87 137*  --  206*  BUN <5* <5* <5* 5*  --  <5*  CREATININE 0.77 0.76 0.85 0.80  --  0.67  CALCIUM 9.6 9.4 9.5 9.2  --  9.8  MG  --  1.6* 2.2 2.1 2.1 2.0  PHOS  --   --  3.3 3.9 3.5 3.7   GFR: Estimated Creatinine Clearance: 71.1 mL/min (by C-G formula based on SCr of 0.67 mg/dL). Liver Function Tests: No results for input(s): AST, ALT, ALKPHOS, BILITOT, PROT, ALBUMIN in the last 168 hours.  No results for input(s): LIPASE, AMYLASE in the last 168 hours.  No results for input(s): AMMONIA in the last 168 hours.  Coagulation Profile: No results for input(s): INR, PROTIME in the last 168 hours.  Cardiac Enzymes: No results for input(s): CKTOTAL, CKMB, CKMBINDEX, TROPONINI in the last 168 hours. BNP (last 3 results) No results for input(s): PROBNP in the last 8760 hours. HbA1C: No results for input(s): HGBA1C in the last 72 hours. CBG: Recent Labs  Lab 01/31/21 1154 01/31/21 1551 01/31/21 2042 02/01/21 0050  02/01/21 0420  GLUCAP 182* 180* 203* 198* 240*   Lipid Profile: No results for input(s): CHOL, HDL, LDLCALC, TRIG, CHOLHDL, LDLDIRECT in the last 72 hours. Thyroid Function Tests: No results for input(s): TSH, T4TOTAL, FREET4, T3FREE, THYROIDAB in the last 72 hours. Anemia Panel: No results for input(s): VITAMINB12, FOLATE, FERRITIN, TIBC, IRON, RETICCTPCT in the last 72 hours. Sepsis Labs: Recent Labs  Lab 01/25/21 1018  PROCALCITON <0.10    Recent Results (from the past 240 hour(s))  SARS CORONAVIRUS 2 (TAT 6-24 HRS) Nasopharyngeal Nasopharyngeal Swab     Status: None   Collection Time: 01/24/21 12:23 AM   Specimen: Nasopharyngeal Swab  Result Value Ref Range Status   SARS Coronavirus 2 NEGATIVE NEGATIVE Final    Comment: (NOTE) SARS-CoV-2 target nucleic acids are NOT DETECTED.  The SARS-CoV-2 RNA is generally detectable in  upper and lower respiratory specimens during the acute phase of infection. Negative results do not preclude SARS-CoV-2 infection, do not rule out co-infections with other pathogens, and should not be used as the sole basis for treatment or other patient management decisions. Negative results must be combined with clinical observations, patient history, and epidemiological information. The expected result is Negative.  Fact Sheet for Patients: HairSlick.no  Fact Sheet for Healthcare Providers: quierodirigir.com  This test is not yet approved or cleared by the [place] FDA and  has been authorized for detection and/or diagnosis of SARS-CoV-2 by FDA under an Emergency Use Authorization (EUA). This EUA will remain  in effect (meaning this test can be used) for the duration of the COVID-19 declaration under Se ction 564(b)(1) of the Act, 21 U.S.C. section 360bbb-3(b)(1), unless the authorization is terminated or revoked sooner.  Performed at [facility], [address], [place],  [place] [address]          Radiology Studies: DG Abd Portable 1V  Result Date: 01/31/2021 CLINICAL DATA:  Evaluate feeding tube placement EXAM: PORTABLE ABDOMEN - 1 VIEW COMPARISON:  01/24/2021 FINDINGS: Interval placement of weighted feeding tube. The tip is well below the GE junction and projects over the expected location of the antro pyloric junction. Bowel gas pattern is nonobstructive. IMPRESSION: Feeding tube tip projects over the antro pyloric junction. Electronically Signed   By: Signa Kell M.D.   On: 01/31/2021 15:19        Scheduled Meds:  bisacodyl  10 mg Rectal Q1500   enoxaparin (LOVENOX) injection  40 mg Subcutaneous Q24H   feeding supplement (PROSource TF)  45 mL Per Tube Daily   free water  100 mL Per Tube Q4H   haloperidol lactate  5 mg Intravenous Q12H   insulin aspart  0-5 Units Subcutaneous QHS   insulin aspart  0-9 Units Subcutaneous TID WC   mirtazapine  7.5 mg Per Tube QHS   multivitamin with minerals  1 tablet Per Tube Daily   polyethylene glycol  17 g Per Tube BID   QUEtiapine  200 mg Per Tube QHS   sodium chloride flush  3 mL Intravenous Once   valACYclovir  500 mg Per Tube QHS   Continuous Infusions:  feeding supplement (OSMOLITE 1.2 CAL) 35 mL/hr at 02/01/21 0100   levETIRAcetam Stopped (01/31/21 2142)     LOS: 7 days    Time spent: 25 mins,More than 50% of that time was spent in counseling and/or coordination of care.      Burnadette Pop, MD [facility] P8/16/2022, 7:48 AM

## 2021-02-01 NOTE — Progress Notes (Signed)
Inpatient Diabetes Program Recommendations  AACE/ADA: New Consensus Statement on Inpatient Glycemic Control (2015)  Target Ranges:  Prepandial:   less than 140 mg/dL      Peak postprandial:   less than 180 mg/dL (1-2 hours)      Critically ill patients:  140 - 180 mg/dL   Lab Results  Component Value Date   GLUCAP 238 (H) 02/01/2021   HGBA1C 6.4 (H) 01/23/2021    Review of Glycemic Control Results for [name], [name] (MRN [id number]) as of 02/01/2021 11:25  Ref. Range 01/31/2021 15:51 01/31/2021 20:42 02/01/2021 00:50 02/01/2021 04:20 02/01/2021 08:08  Glucose-Capillary Latest Ref Range: 70 - 99 mg/dL 076 (H) 808 (H) 811 (H) 240 (H) 238 (H)   Diabetes history: DM2 Outpatient Diabetes medications:  Lantus 40 units q HS, Amaryl 2 mg daily, Novolog 1-7 units tid with meals Current orders for Inpatient glycemic control:  Novolog sensitive tid with meals and HS Osmolite 65 cc/ hr Inpatient Diabetes Program Recommendations:   Note that Novolog correction added this morning.  Consider increasing frequency of Novolog correction to q 4 hours since patient is on tube feeds.   Thanks,  Beryl Meager, RN, BC-ADM Inpatient Diabetes Coordinator Pager [phone]  (8a-5p)

## 2021-02-01 NOTE — Progress Notes (Signed)
Pt was very agitated and irritable at the beginning of shift, HR noted to be between 110's to 120's. MD notified and new orders received. Pt CBG noted to be high and 240 at 0400. MD on-call notified and new orders received for sliding coverage. Interventions effective and pt resting comfortably in bed with call light within reach. Will continue to closely monitor and report off to oncoming RN. Dionne Bucy RN

## 2021-02-01 NOTE — Consult Note (Signed)
02/01/2021 [name]  MRN:  [id number] Reason for Consult:  Flat affect, refusal to eat, and medication management Referring Physician:  Stephania Fragmin, MD Method of visit?: Face to Face   Principal Problem: Acute encephalopathy Diagnosis:  Principal Problem:   Acute encephalopathy Active Problems:   Hypoglycemia   Constipation   Bipolar disorder (HCC)   Diabetes (HCC)   AMS (altered mental status)   Poor appetite   Subjective: [name] is a 55 year old female admitted for evaluation of acute mental status change and consulted to the psychiatry service for potential medication management in light of flat affect and refusal to eat. This morning, she was more drowsy than normal, but her responses to questions were more appropriate. She said that she wasn't feeling so good because she had to sleep sitting up due to the tube being in her nose. She denied pain but said that the tube is uncomfortable and she just wants to go home. She was sitting up in bed in soft bilateral wrist restraints with the cortrak placed in her nose.   Treatment Plan Summary: [name] is more appropriate in her responses today after an increase in Haldol. However, she is more sedated, and fell asleep after answering only a few questions.   Ddx: IDD symptomology vs. Bipolar disorder with psychotic features vs. MDD- recurrent/severe/with psychotic features -Decrease Haldol to 2.5 mg qAM and 5 mg qAM IV due to sedation. Qtc 445 today.  -Continue Cogentin 1 mg IV daily.    Disposition: Psychiatry will continue to follow.  Lamar Sprinkles, MD PGY-1 02/01/2021 [facility] Department of Psychiatry

## 2021-02-02 DIAGNOSIS — F79 Unspecified intellectual disabilities: Secondary | ICD-10-CM | POA: Diagnosis not present

## 2021-02-02 DIAGNOSIS — R4182 Altered mental status, unspecified: Secondary | ICD-10-CM | POA: Diagnosis not present

## 2021-02-02 DIAGNOSIS — R630 Anorexia: Secondary | ICD-10-CM | POA: Diagnosis not present

## 2021-02-02 LAB — PHOSPHORUS: Phosphorus: 3.3 mg/dL (ref 2.5–4.6)

## 2021-02-02 LAB — GLUCOSE, CAPILLARY
Glucose-Capillary: 281 mg/dL — ABNORMAL HIGH (ref 70–99)
Glucose-Capillary: 230 mg/dL — ABNORMAL HIGH (ref 70–99)
Glucose-Capillary: 239 mg/dL — ABNORMAL HIGH (ref 70–99)
Glucose-Capillary: 195 mg/dL — ABNORMAL HIGH (ref 70–99)

## 2021-02-02 LAB — BASIC METABOLIC PANEL
Anion gap: 8 (ref 5–15)
BUN: 5 mg/dL — ABNORMAL LOW (ref 6–20)
CO2: 25 mmol/L (ref 22–32)
Calcium: 9.6 mg/dL (ref 8.9–10.3)
Chloride: 104 mmol/L (ref 98–111)
Creatinine, Ser: 0.67 mg/dL (ref 0.44–1.00)
GFR, Estimated: >60 mL/min (ref >60)
Glucose, Bld: 310 mg/dL — ABNORMAL HIGH (ref 70–99)
Potassium: 4.2 mmol/L (ref 3.5–5.1)
Sodium: 137 mmol/L (ref 135–145)

## 2021-02-02 LAB — MAGNESIUM: Magnesium: 2 mg/dL (ref 1.7–2.4)

## 2021-02-02 MED ORDER — INSULIN GLARGINE-YFGN 100 UNIT/ML ~~LOC~~ SOLN
15.0000 [IU] | Freq: Every day | SUBCUTANEOUS | Status: DC
  Administered 2021-02-02 – 2021-02-03 (×2): 15 [IU] via SUBCUTANEOUS
  Filled 2021-02-02 (×2): qty 0.15

## 2021-02-02 MED ORDER — HALOPERIDOL LACTATE 5 MG/ML IJ SOLN
5.0000 mg | Freq: Every day | INTRAMUSCULAR | Status: DC
  Administered 2021-02-03 – 2021-02-06 (×4): 5 mg via INTRAVENOUS
  Filled 2021-02-02 (×5): qty 1

## 2021-02-02 MED ORDER — HALOPERIDOL LACTATE 5 MG/ML IJ SOLN
2.5000 mg | Freq: Every day | INTRAMUSCULAR | Status: DC
  Administered 2021-02-03 – 2021-02-09 (×7): 2.5 mg via INTRAVENOUS
  Filled 2021-02-02 (×6): qty 1

## 2021-02-02 NOTE — Consult Note (Signed)
Psychiatry C/L Progress Note  [name]  MRN:  [id number] Reason for Consult:  Flat affect, refusal to eat, and medication management Referring Physician:  Stephania Fragmin, MD Method of visit?: Face to Face    Principal Problem: Acute encephalopathy Diagnosis:  Principal Problem:   Acute encephalopathy Active Problems:   Hypoglycemia   Constipation   Bipolar disorder (HCC)   Diabetes (HCC)   AMS (altered mental status)   Poor appetite   Subjective: [name] is a 55 year old female admitted for evaluation of acute mental status change and consulted to the psychiatry service for potential medication management in light of flat affect and refusal to eat. This morning, she was still drowsy but less so than yesterday, so her medications appear to be less sedating. Her responses to questions were largely appropriate but she still maintains, "I have no teeth, so I can't eat." She is A&O to person, time, and place, and is excited about her birthday Friday, saying that she would like chocolate cake. She is appropriate when discussing her relationship with [place] and her home life. She said that she feels better now that she is getting nutrition. She was sitting up in bed in soft bilateral wrist restraints with the cortrak placed in her nose.    Treatment Plan Summary: Kyelle is largely appropriate in her responses and less sedated today after Seroquel discontinued and a decrease in Haldol. She appears to be tolerating the current dosages of medication well.    Ddx: IDD symptomology vs. Bipolar disorder with psychotic features vs. MDD- recurrent/severe/with psychotic features -Continue Haldol 2.5 mg qAM and 5 mg qAM IV. Qtc 445.  -Continue Cogentin 1 mg IV daily. -Continue Remeron 7.5 mg qHS for appetite stimulation -Seroquel d/c since Haldol was started -Will reassess on Friday... with chocolate cake.      Disposition: Psychiatry will continue to follow.  Lamar Sprinkles,  MD PGY-1 02/02/2021 [facility] Department of Psychiatry

## 2021-02-02 NOTE — Progress Notes (Signed)
PROGRESS NOTE    [name]  DVV:[id number] DOB: 1965/09/05 DOA: 01/23/2021 PCP: Mirna Mires, MD   Chief Complain: Altered mental status  Brief Narrative: Patient is a 55 year old female with history of intellectual disability, bipolar disorder, seizure disorder, diabetes type 2, hypertension, hyperlipidemia, immunodeficiency disorder who presented to the emergency department from home for evaluation of altered mental status.  CT head was negative on admission.  Abdominal x-ray showed moderate stool burden and patient was confused.  MRI of the brain, EEG and other neurological work-up was negative.  She continues to have poor oral intake, lack of desire to eat/drink with flat affect.  Psychiatry consulted and following.  Started on Remeron,Seroquel,haldol. Coretrack placed on 8/15. Dietitian, psychiatry following  Assessment & Plan:   Principal Problem:   Acute encephalopathy Active Problems:   Hypoglycemia   Constipation   Bipolar disorder (HCC)   Diabetes (HCC)   AMS (altered mental status)   Poor appetite   Acute encephalopathy/flat affect: Unclear etiology. Most likely due to underlying psychiatric issue.  No focal neurological deficits.  Brain imagings negative for acute intracranial abnormalities.  EEG did not show any seizures.  UA negative, UDS negative.  Procalcitonin negative ruling out infectious process.  Vitamin B12, ammonia, TSH normal.  Psychiatry consulted and following.  Currently on Remeron, Seroquel.  She still is not coherent and does not participate with any meaningful conversation but is apparently alert and oriented.Started on haldol  IV,IV cogentin.  Psychiatry recommending to follow-up daily .  She might need inpatient psychiatric placement. Currently on restraints.  History of seizure: Continue Keppra 1 g twice daily, converted to IV due to poor oral intake.  EEG did not show any seizure or epileptiform discharge.  Neurology was following.  Bipolar  disorder: On risperidone, trazodone at home.  Diabetes /poor oral intake: Intermittently hypoglycemic. Was on D5, now discontinued because blood sugars are stable.  Nutrition consulted.  Very poor oral intake.  Takes Amaryl, metformin, Lantus, sliding scale at home.  Hemoglobin A1c of 6.4.  Core track placed. Monitor blood sugars  Hypotension: Blood pressure soft.  Continue to monitor.  Hypokalemia/hypomagnesemia: Supplemented with potassium/magnesium and being monitored intermittently  Nutrition Problem: Inadequate oral intake Etiology: lethargy/confusion      DVT prophylaxis:Lovenox Code Status: Full Family Communication: Called and discussed with sister on phone on 01/31/21 Status is: Inpatient  Remains inpatient appropriate because:Unsafe d/c plan  Dispo: The patient is from: Home              Anticipated d/c is to: Home vs Inpatient psych              Patient currently is not medically stable to d/c.   Difficult to place patient No    Consultants: Psychiatry, neurology  Procedures: None  Antimicrobials:  Anti-infectives (From admission, onward)    Start     Dose/Rate Route Frequency Ordered Stop   01/31/21 2215  valACYclovir (VALTREX) tablet 500 mg        500 mg Per Tube Daily at bedtime 01/31/21 2119     01/25/21 2200  valACYclovir (VALTREX) tablet 500 mg  Status:  Discontinued        500 mg Oral Daily at bedtime 01/25/21 0930 01/31/21 2119       Subjective:  Patient seen and examined at the bedside this morning.  Hemodynamically stable.  On restraints.  Being fed through the tube.  She knows current location and month.  Objective: Vitals:   02/02/21 0003 02/02/21  0327 02/02/21 0435 02/02/21 0756  BP: 129/71 119/70  109/69  Pulse: (!) 105 (!) 110  90  Resp: 20 18  20   Temp: 98 F (36.7 C) 98.9 F (37.2 C)  98.5 F (36.9 C)  TempSrc:  Oral  Oral  SpO2: 97% 98%  96%  Weight:   70 kg     Intake/Output Summary (Last 24 hours) at 02/02/2021 0804 Last  data filed at 02/02/2021 0327 Gross per 24 hour  Intake 482.92 ml  Output 2325 ml  Net -1842.08 ml   Filed Weights   01/31/21 0442 02/01/21 0432 02/02/21 0435  Weight: 72.5 kg 70.1 kg 70 kg    Examination: General exam: on restrains HEENT: PERRL Respiratory system:  no wheezes or crackles  Cardiovascular system: S1 & S2 heard, RRR.  Gastrointestinal system: Abdomen is nondistended, soft and nontender. Central nervous system: Alert and oriented Extremities: No edema, no clubbing ,no cyanosis Skin: No rashes, no ulcers,no icterus     Data Reviewed: I have personally reviewed following labs and imaging studies  CBC: Recent Labs  Lab 01/27/21 0151  WBC 7.2  HGB 12.5  HCT 37.4  MCV 90.3  PLT 338   Basic Metabolic Panel: Recent Labs  Lab 01/27/21 0151 01/28/21 0428 01/29/21 0725 01/30/21 0546 02/01/21 0015 02/02/21 0110  NA 137 138 137  --  140 137  K 3.2* 3.9 3.8  --  3.4* 4.2  CL 105 103 105  --  104 104  CO2 24 25 23   --  26 25  GLUCOSE 156* 87 137*  --  206* 310*  BUN <5* <5* 5*  --  <5* 5*  CREATININE 0.76 0.85 0.80  --  0.67 0.67  CALCIUM 9.4 9.5 9.2  --  9.8 9.6  MG 1.6* 2.2 2.1 2.1 2.0 2.0  PHOS  --  3.3 3.9 3.5 3.7 3.3   GFR: Estimated Creatinine Clearance: 71.1 mL/min (by C-G formula based on SCr of 0.67 mg/dL). Liver Function Tests: No results for input(s): AST, ALT, ALKPHOS, BILITOT, PROT, ALBUMIN in the last 168 hours.  No results for input(s): LIPASE, AMYLASE in the last 168 hours.  No results for input(s): AMMONIA in the last 168 hours.  Coagulation Profile: No results for input(s): INR, PROTIME in the last 168 hours.  Cardiac Enzymes: No results for input(s): CKTOTAL, CKMB, CKMBINDEX, TROPONINI in the last 168 hours. BNP (last 3 results) No results for input(s): PROBNP in the last 8760 hours. HbA1C: No results for input(s): HGBA1C in the last 72 hours. CBG: Recent Labs  Lab 02/01/21 0808 02/01/21 1201 02/01/21 1611 02/01/21 2113  02/02/21 0440  GLUCAP 238* 290* 246* 277* 281*   Lipid Profile: No results for input(s): CHOL, HDL, LDLCALC, TRIG, CHOLHDL, LDLDIRECT in the last 72 hours. Thyroid Function Tests: No results for input(s): TSH, T4TOTAL, FREET4, T3FREE, THYROIDAB in the last 72 hours. Anemia Panel: No results for input(s): VITAMINB12, FOLATE, FERRITIN, TIBC, IRON, RETICCTPCT in the last 72 hours. Sepsis Labs: No results for input(s): PROCALCITON, LATICACIDVEN in the last 168 hours.   Recent Results (from the past 240 hour(s))  SARS CORONAVIRUS 2 (TAT 6-24 HRS) Nasopharyngeal Nasopharyngeal Swab     Status: None   Collection Time: 01/24/21 12:23 AM   Specimen: Nasopharyngeal Swab  Result Value Ref Range Status   SARS Coronavirus 2 NEGATIVE NEGATIVE Final    Comment: (NOTE) SARS-CoV-2 target nucleic acids are NOT DETECTED.  The SARS-CoV-2 RNA is generally detectable in upper and lower respiratory  specimens during the acute phase of infection. Negative results do not preclude SARS-CoV-2 infection, do not rule out co-infections with other pathogens, and should not be used as the sole basis for treatment or other patient management decisions. Negative results must be combined with clinical observations, patient history, and epidemiological information. The expected result is Negative.  Fact Sheet for Patients: HairSlick.no  Fact Sheet for Healthcare Providers: quierodirigir.com  This test is not yet approved or cleared by the [place] FDA and  has been authorized for detection and/or diagnosis of SARS-CoV-2 by FDA under an Emergency Use Authorization (EUA). This EUA will remain  in effect (meaning this test can be used) for the duration of the COVID-19 declaration under Se ction 564(b)(1) of the Act, 21 U.S.C. section 360bbb-3(b)(1), unless the authorization is terminated or revoked sooner.  Performed at [facility], [address], [place], [place] [address]          Radiology Studies: DG Abd Portable 1V  Result Date: 01/31/2021 CLINICAL DATA:  Evaluate feeding tube placement EXAM: PORTABLE ABDOMEN - 1 VIEW COMPARISON:  01/24/2021 FINDINGS: Interval placement of weighted feeding tube. The tip is well below the GE junction and projects over the expected location of the antro pyloric junction. Bowel gas pattern is nonobstructive. IMPRESSION: Feeding tube tip projects over the antro pyloric junction. Electronically Signed   By: Signa Kell M.D.   On: 01/31/2021 15:19        Scheduled Meds:  benztropine mesylate  1 mg Intravenous Daily   bisacodyl  10 mg Rectal Q1500   enoxaparin (LOVENOX) injection  40 mg Subcutaneous Q24H   feeding supplement (PROSource TF)  45 mL Per Tube Daily   free water  100 mL Per Tube Q4H   haloperidol lactate  5 mg Intravenous Q12H   insulin aspart  0-5 Units Subcutaneous QHS   insulin aspart  0-9 Units Subcutaneous Q4H   levETIRAcetam  1,000 mg Per Tube BID   mirtazapine  7.5 mg Per Tube QHS   multivitamin with minerals  1 tablet Per Tube Daily   polyethylene glycol  17 g Per Tube BID   sodium chloride flush  3 mL Intravenous Once   valACYclovir  500 mg Per Tube QHS   Continuous Infusions:  feeding supplement (OSMOLITE 1.2 CAL) 1,000 mL (02/01/21 1838)     LOS: 8 days    Time spent: 25 mins,More than 50% of that time was spent in counseling and/or coordination of care.      Burnadette Pop, MD [facility] P8/17/2022, 8:04 AM

## 2021-02-03 LAB — BASIC METABOLIC PANEL
Anion gap: 10 (ref 5–15)
BUN: 10 mg/dL (ref 6–20)
CO2: 28 mmol/L (ref 22–32)
Calcium: 9.8 mg/dL (ref 8.9–10.3)
Chloride: 98 mmol/L (ref 98–111)
Creatinine, Ser: 0.72 mg/dL (ref 0.44–1.00)
GFR, Estimated: >60 mL/min (ref >60)
Glucose, Bld: 249 mg/dL — ABNORMAL HIGH (ref 70–99)
Potassium: 4.5 mmol/L (ref 3.5–5.1)
Sodium: 136 mmol/L (ref 135–145)

## 2021-02-03 LAB — GLUCOSE, CAPILLARY
Glucose-Capillary: 231 mg/dL — ABNORMAL HIGH (ref 70–99)
Glucose-Capillary: 258 mg/dL — ABNORMAL HIGH (ref 70–99)
Glucose-Capillary: 266 mg/dL — ABNORMAL HIGH (ref 70–99)
Glucose-Capillary: 291 mg/dL — ABNORMAL HIGH (ref 70–99)
Glucose-Capillary: 302 mg/dL — ABNORMAL HIGH (ref 70–99)
Glucose-Capillary: 305 mg/dL — ABNORMAL HIGH (ref 70–99)

## 2021-02-03 LAB — PHOSPHORUS: Phosphorus: 4.1 mg/dL (ref 2.5–4.6)

## 2021-02-03 LAB — MAGNESIUM: Magnesium: 2.1 mg/dL (ref 1.7–2.4)

## 2021-02-03 MED ORDER — INSULIN GLARGINE-YFGN 100 UNIT/ML ~~LOC~~ SOLN
25.0000 [IU] | Freq: Every day | SUBCUTANEOUS | Status: DC
  Administered 2021-02-04 – 2021-02-06 (×3): 25 [IU] via SUBCUTANEOUS
  Filled 2021-02-03 (×4): qty 0.25

## 2021-02-03 NOTE — Progress Notes (Addendum)
PROGRESS NOTE    [name]  PYP:[id number] DOB: December 26, 1965 DOA: 01/23/2021 PCP: Mirna Mires, MD   Chief Complain: Altered mental status  Brief Narrative: Patient is a 55 year old female with history of intellectual disability, bipolar disorder, seizure disorder, diabetes type 2, hypertension, hyperlipidemia, immunodeficiency disorder who presented to the emergency department from home for evaluation of altered mental status.  CT head was negative on admission.  Abdominal x-ray showed moderate stool burden and patient was confused.  MRI of the brain, EEG and other neurological work-up was negative.  She continues to have poor oral intake, lack of desire to eat/drink with flat affect.  Psychiatry consulted and following.  Started on Remeron,haldol,cogentin. Coretrack placed on 8/15 due to poor oral intake. Dietitian, psychiatry following.  Waiting for final psychiatric recommendation before disposition. Feeding tube needs  to be out before discharge  Assessment & Plan:   Principal Problem:   Acute encephalopathy Active Problems:   Hypoglycemia   Constipation   Bipolar disorder (HCC)   Diabetes (HCC)   AMS (altered mental status)   Poor appetite   Acute encephalopathy/flat affect:  Most likely due to underlying psychiatric issue.  No focal neurological deficits.  Brain imagings negative for acute intracranial abnormalities.  EEG did not show any seizures.  UA negative, UDS negative.  Procalcitonin negative ruling out infectious process.  Vitamin B12, ammonia, TSH normal.  Psychiatry consulted and following.  Currently on Remeron, Seroquel.  She still is not coherent and does not participate that much with conversation but is apparently alert and oriented.Started on haldol  IV, cogentin.  Psychiatry recommending to follow-up daily .  She might need inpatient psychiatric placement. We will discontinue restraints today ans see how she does.  History of seizure: Continue Keppra 1 g twice  daily, converted to IV due to poor oral intake.  EEG did not show any seizure or epileptiform discharge.  Neurology was following.  Bipolar disorder: She was on  risperidone, trazodone at home.Psychiatry following  Diabetes /poor oral intake: She was intermittently hypoglycemic. Was on D5, now discontinued after putting feeding tube  Nutrition consulted and following.  Very poor oral intake.  Takes Amaryl, metformin, Lantus, sliding scale at home.  Hemoglobin A1c of 6.4.  . Monitor blood sugars,now up .Restarted insulin.Dietician following.We have plan to remove cortrack when she has adequate oral intake  Hypokalemia/hypomagnesemia: She was supplemented with potassium/magnesium and being monitored intermittently  Nutrition Problem: Inadequate oral intake Etiology: lethargy/confusion      DVT prophylaxis:Lovenox Code Status: Full Family Communication: Called and discussed with sister on phone on 02/02/21 Status is: Inpatient  Remains inpatient appropriate because:Unsafe d/c plan  Dispo: The patient is from: Home              Anticipated d/c is to: Home vs Inpatient psych              Patient currently is not medically stable to d/c.   Difficult to place patient No    Consultants: Psychiatry, neurology  Procedures: None  Antimicrobials:  Anti-infectives (From admission, onward)    Start     Dose/Rate Route Frequency Ordered Stop   01/31/21 2215  valACYclovir (VALTREX) tablet 500 mg        500 mg Per Tube Daily at bedtime 01/31/21 2119     01/25/21 2200  valACYclovir (VALTREX) tablet 500 mg  Status:  Discontinued        500 mg Oral Daily at bedtime 01/25/21 0930 01/31/21 2119  Subjective:  Patient seen and examined at the bedside this morning.  Her mental status might have improved.  She opens her eyes and tries to talk.  She is not agitated like before.  She was still on restraints which we will discontinue today.  She is still on feeding tube.  Objective: Vitals:    02/02/21 1534 02/02/21 2136 02/02/21 2355 02/03/21 0430  BP: 113/60 (!) 126/55 120/60 121/78  Pulse: 93 98 96 98  Resp: 20 18 18 20   Temp: 98.4 F (36.9 C) 98.8 F (37.1 C) 98.7 F (37.1 C) 98.8 F (37.1 C)  TempSrc: Oral Oral Oral Oral  SpO2: 98% 100% 100% 98%  Weight:        Intake/Output Summary (Last 24 hours) at 02/03/2021 0750 Last data filed at 02/03/2021 0450 Gross per 24 hour  Intake --  Output 1100 ml  Net -1100 ml   Filed Weights   01/31/21 0442 02/01/21 0432 02/02/21 0435  Weight: 72.5 kg 70.1 kg 70 kg    Examination:  General exam: Opens her eyes on calling her name, did not talk that much, looks overall comfortable, not agitated HEENT: cortrack tube Respiratory system:  no wheezes or crackles  Cardiovascular system: S1 & S2 heard, RRR.  Gastrointestinal system: Abdomen is nondistended, soft and nontender. Central nervous system: awake,obeys commands Extremities: No edema, no clubbing ,no cyanosis Skin: No rashes, no ulcers,no icterus       Data Reviewed: I have personally reviewed following labs and imaging studies  CBC: No results for input(s): WBC, NEUTROABS, HGB, HCT, MCV, PLT in the last 168 hours.  Basic Metabolic Panel: Recent Labs  Lab 01/28/21 0428 01/29/21 0725 01/30/21 0546 02/01/21 0015 02/02/21 0110 02/03/21 0304  NA 138 137  --  140 137 136  K 3.9 3.8  --  3.4* 4.2 4.5  CL 103 105  --  104 104 98  CO2 25 23  --  26 25 28   GLUCOSE 87 137*  --  206* 310* 249*  BUN <5* 5*  --  <5* 5* 10  CREATININE 0.85 0.80  --  0.67 0.67 0.72  CALCIUM 9.5 9.2  --  9.8 9.6 9.8  MG 2.2 2.1 2.1 2.0 2.0 2.1  PHOS 3.3 3.9 3.5 3.7 3.3 4.1   GFR: Estimated Creatinine Clearance: 71.1 mL/min (by C-G formula based on SCr of 0.72 mg/dL). Liver Function Tests: No results for input(s): AST, ALT, ALKPHOS, BILITOT, PROT, ALBUMIN in the last 168 hours.  No results for input(s): LIPASE, AMYLASE in the last 168 hours.  No results for input(s): AMMONIA in  the last 168 hours.  Coagulation Profile: No results for input(s): INR, PROTIME in the last 168 hours.  Cardiac Enzymes: No results for input(s): CKTOTAL, CKMB, CKMBINDEX, TROPONINI in the last 168 hours. BNP (last 3 results) No results for input(s): PROBNP in the last 8760 hours. HbA1C: No results for input(s): HGBA1C in the last 72 hours. CBG: Recent Labs  Lab 02/02/21 0819 02/02/21 1211 02/02/21 1619 02/03/21 0038 02/03/21 0449  GLUCAP 230* 239* 195* 266* 258*   Lipid Profile: No results for input(s): CHOL, HDL, LDLCALC, TRIG, CHOLHDL, LDLDIRECT in the last 72 hours. Thyroid Function Tests: No results for input(s): TSH, T4TOTAL, FREET4, T3FREE, THYROIDAB in the last 72 hours. Anemia Panel: No results for input(s): VITAMINB12, FOLATE, FERRITIN, TIBC, IRON, RETICCTPCT in the last 72 hours. Sepsis Labs: No results for input(s): PROCALCITON, LATICACIDVEN in the last 168 hours.   No results found for  this or any previous visit (from the past 240 hour(s)).        Radiology Studies: No results found.      Scheduled Meds:  benztropine mesylate  1 mg Intravenous Daily   bisacodyl  10 mg Rectal Q1500   enoxaparin (LOVENOX) injection  40 mg Subcutaneous Q24H   feeding supplement (PROSource TF)  45 mL Per Tube Daily   free water  100 mL Per Tube Q4H   haloperidol lactate  2.5 mg Intravenous QAC breakfast   haloperidol lactate  5 mg Intravenous QHS   insulin aspart  0-5 Units Subcutaneous QHS   insulin aspart  0-9 Units Subcutaneous Q4H   insulin glargine-yfgn  15 Units Subcutaneous Daily   levETIRAcetam  1,000 mg Per Tube BID   mirtazapine  7.5 mg Per Tube QHS   multivitamin with minerals  1 tablet Per Tube Daily   polyethylene glycol  17 g Per Tube BID   sodium chloride flush  3 mL Intravenous Once   valACYclovir  500 mg Per Tube QHS   Continuous Infusions:  feeding supplement (OSMOLITE 1.2 CAL) 1,000 mL (02/02/21 1308)     LOS: 9 days    Time spent: 25  mins,More than 50% of that time was spent in counseling and/or coordination of care.      Burnadette Pop, MD [facility] P8/18/2022, 7:50 AM

## 2021-02-04 LAB — GLUCOSE, CAPILLARY
Glucose-Capillary: 218 mg/dL — ABNORMAL HIGH (ref 70–99)
Glucose-Capillary: 237 mg/dL — ABNORMAL HIGH (ref 70–99)
Glucose-Capillary: 238 mg/dL — ABNORMAL HIGH (ref 70–99)
Glucose-Capillary: 239 mg/dL — ABNORMAL HIGH (ref 70–99)
Glucose-Capillary: 248 mg/dL — ABNORMAL HIGH (ref 70–99)
Glucose-Capillary: 303 mg/dL — ABNORMAL HIGH (ref 70–99)
Glucose-Capillary: 343 mg/dL — ABNORMAL HIGH (ref 70–99)

## 2021-02-04 MED ORDER — GLUCERNA 1.2 CAL PO LIQD
1000.0000 mL | ORAL | Status: DC
  Administered 2021-02-04 – 2021-02-12 (×8): 1000 mL
  Filled 2021-02-04 (×21): qty 1000

## 2021-02-04 NOTE — Progress Notes (Signed)
PROGRESS NOTE    [name]  OIN:[id number] DOB: 1965/08/05 DOA: 01/23/2021 PCP: Mirna Mires, MD   Chief Complain: Altered mental status  Brief Narrative: Patient is a 55 year old female with history of intellectual disability, bipolar disorder, seizure disorder, diabetes type 2, hypertension, hyperlipidemia, immunodeficiency disorder who presented to the emergency department from home for evaluation of altered mental status.  CT head was negative on admission.  Abdominal x-ray showed moderate stool burden and patient was confused.  MRI of the brain, EEG and other neurological work-up was negative.  She continues to have poor oral intake, lack of desire to eat/drink with flat affect.  Psychiatry consulted and following.  Started on Remeron,haldol,cogentin. Coretrack placed on 8/15 due to poor oral intake. Dietitian, psychiatry following.  Waiting for final psychiatric recommendation before disposition. Feeding tube needs  to be out before discharge  02/04/2021: Patient seen.  No new complaints.  Patient is not a particularly good historian.  Patient still has feeding tube in place.   Assessment & Plan:   Principal Problem:   Acute encephalopathy Active Problems:   Hypoglycemia   Constipation   Bipolar disorder (HCC)   Diabetes (HCC)   AMS (altered mental status)   Poor appetite   Acute encephalopathy/flat affect:  Most likely due to underlying psychiatric issue.  No focal neurological deficits.  Brain imagings negative for acute intracranial abnormalities.  EEG did not show any seizures.  UA negative, UDS negative.  Procalcitonin negative ruling out infectious process.  Vitamin B12, ammonia, TSH normal.  Psychiatry consulted and following.  Currently on Remeron, Seroquel.  She still is not coherent and does not participate that much with conversation but is apparently alert and oriented.Started on haldol  IV, cogentin.  Psychiatry recommending to follow-up daily .  She might need  inpatient psychiatric placement. We will discontinue restraints today ans see how she does. 02/04/2021: Patient hospitalized once for non-STEMI.  Patient will be back to her baseline.  History of seizure: Continue Keppra 1 g twice daily, converted to IV due to poor oral intake.  EEG did not show any seizure or epileptiform discharge.  Neurology was following.  Bipolar disorder: She was on  risperidone, trazodone at home.Psychiatry following 02/04/2021: No psychopathology indicative of bipolar disorder at the moment.    Diabetes /poor oral intake: She was intermittently hypoglycemic. Was on D5, now discontinued after putting feeding tube  Nutrition consulted and following.  Very poor oral intake.  Takes Amaryl, metformin, Lantus, sliding scale at home.  Hemoglobin A1c of 6.4.  . Monitor blood sugars,now up .Restarted insulin.Dietician following.We have plan to remove cortrack when she has adequate oral intake 02/04/2021: Continue to optimize.  Blood sugar is in the 200 range.  Hypokalemia/hypomagnesemia: She was supplemented with potassium/magnesium and being monitored intermittently 02/04/2021: Last potassium check was 4.5, 2.1.  Continue to monitor closely.  Nutrition Problem: Inadequate oral intake Etiology: lethargy/confusion   DVT prophylaxis:Lovenox Code Status: Full Family Communication: Called and discussed with sister on phone on 02/02/21 Status is: Inpatient  Remains inpatient appropriate because:Unsafe d/c plan  Dispo: The patient is from: Home              Anticipated d/c is to: Home vs Inpatient psych              Patient currently is not medically stable to d/c.   Difficult to place patient No    Consultants: Psychiatry, neurology  Procedures: None  Antimicrobials:  Anti-infectives (From admission, onward)  Start     Dose/Rate Route Frequency Ordered Stop   01/31/21 2215  valACYclovir (VALTREX) tablet 500 mg        500 mg Per Tube Daily at bedtime 01/31/21 2119      01/25/21 2200  valACYclovir (VALTREX) tablet 500 mg  Status:  Discontinued        500 mg Oral Daily at bedtime 01/25/21 0930 01/31/21 2119       Subjective: No significant history from patient.    Objective: Vitals:   02/04/21 0452 02/04/21 0828 02/04/21 1128 02/04/21 1636  BP:  129/65 116/63 (!) 110/58  Pulse:  86 91 80  Resp:  18 16 20   Temp:  98.2 F (36.8 C) 97.9 F (36.6 C) 98.4 F (36.9 C)  TempSrc:  Oral Oral Oral  SpO2:  100% 99% 98%  Weight: 73.6 kg       Intake/Output Summary (Last 24 hours) at 02/04/2021 1701 Last data filed at 02/04/2021 1524 Gross per 24 hour  Intake 4331.08 ml  Output --  Net 4331.08 ml    Filed Weights   02/01/21 0432 02/02/21 0435 02/04/21 0452  Weight: 70.1 kg 70 kg 73.6 kg    Examination:  General exam: Opens her eyes on calling her name, did not talk that much, looks overall comfortable, not agitated HEENT: cortrack tube Respiratory system:  no wheezes or crackles  Cardiovascular system: S1 & S2 heard, with increased S2 component of the heart sound.    Gastrointestinal system: Abdomen is nondistended, soft and nontender. Central nervous system: awake,obeys commands.  Patient moves all extremities. Extremities: No edema, no clubbing ,no cyanosis Skin: No rashes, no ulcers,no icterus       Data Reviewed: I have personally reviewed following labs and imaging studies  CBC: No results for input(s): WBC, NEUTROABS, HGB, HCT, MCV, PLT in the last 168 hours.  Basic Metabolic Panel: Recent Labs  Lab 01/29/21 0725 01/30/21 0546 02/01/21 0015 02/02/21 0110 02/03/21 0304  NA 137  --  140 137 136  K 3.8  --  3.4* 4.2 4.5  CL 105  --  104 104 98  CO2 23  --  26 25 28   GLUCOSE 137*  --  206* 310* 249*  BUN 5*  --  <5* 5* 10  CREATININE 0.80  --  0.67 0.67 0.72  CALCIUM 9.2  --  9.8 9.6 9.8  MG 2.1 2.1 2.0 2.0 2.1  PHOS 3.9 3.5 3.7 3.3 4.1    GFR: Estimated Creatinine Clearance: 72 mL/min (by C-G formula based on SCr of  0.72 mg/dL). Liver Function Tests: No results for input(s): AST, ALT, ALKPHOS, BILITOT, PROT, ALBUMIN in the last 168 hours.  No results for input(s): LIPASE, AMYLASE in the last 168 hours.  No results for input(s): AMMONIA in the last 168 hours.  Coagulation Profile: No results for input(s): INR, PROTIME in the last 168 hours.  Cardiac Enzymes: No results for input(s): CKTOTAL, CKMB, CKMBINDEX, TROPONINI in the last 168 hours. BNP (last 3 results) No results for input(s): PROBNP in the last 8760 hours. HbA1C: No results for input(s): HGBA1C in the last 72 hours. CBG: Recent Labs  Lab 02/04/21 0409 02/04/21 0830 02/04/21 1128 02/04/21 1626 02/04/21 1629  GLUCAP 238* 239* 343* 248* 237*    Lipid Profile: No results for input(s): CHOL, HDL, LDLCALC, TRIG, CHOLHDL, LDLDIRECT in the last 72 hours. Thyroid Function Tests: No results for input(s): TSH, T4TOTAL, FREET4, T3FREE, THYROIDAB in the last 72 hours. Anemia Panel:  No results for input(s): VITAMINB12, FOLATE, FERRITIN, TIBC, IRON, RETICCTPCT in the last 72 hours. Sepsis Labs: No results for input(s): PROCALCITON, LATICACIDVEN in the last 168 hours.   No results found for this or any previous visit (from the past 240 hour(s)).        Radiology Studies: No results found.      Scheduled Meds:  benztropine mesylate  1 mg Intravenous Daily   bisacodyl  10 mg Rectal Q1500   enoxaparin (LOVENOX) injection  40 mg Subcutaneous Q24H   free water  100 mL Per Tube Q4H   haloperidol lactate  2.5 mg Intravenous QAC breakfast   haloperidol lactate  5 mg Intravenous QHS   insulin aspart  0-5 Units Subcutaneous QHS   insulin aspart  0-9 Units Subcutaneous Q4H   insulin glargine-yfgn  25 Units Subcutaneous Daily   levETIRAcetam  1,000 mg Per Tube BID   mirtazapine  7.5 mg Per Tube QHS   multivitamin with minerals  1 tablet Per Tube Daily   polyethylene glycol  17 g Per Tube BID   sodium chloride flush  3 mL  Intravenous Once   valACYclovir  500 mg Per Tube QHS   Continuous Infusions:  feeding supplement (GLUCERNA 1.2 CAL) 1,000 mL (02/04/21 1433)     LOS: 10 days    Time spent: 25 mins,More than 50% of that time was spent in counseling and/or coordination of care.      Barnetta Chapel, MD [facility] P8/19/2022, 5:01 PM

## 2021-02-04 NOTE — Progress Notes (Signed)
Nutrition Follow-up  DOCUMENTATION CODES:  Not applicable  INTERVENTION:  Continue TF via Cortrak: -Transition to Glucerna 1.2 @ 3m/hr (15681md)  -d/c 4559mrosource TF daily -100m59mee water Q4H  Provides 1872 kcals, 93 grams protein, 1255ml25me water (1855ml 40ml free water)  NUTRITION DIAGNOSIS:  Inadequate oral intake related to lethargy/confusion as evidenced by meal completion < 25%, per patient/family report. --ongoing  GOAL:  Patient will meet greater than or equal to 90% of their needs -- met with TF  MONITOR:  TF tolerance, Weight trends, Labs, I & O's  REASON FOR ASSESSMENT:  Consult Enteral/tube feeding initiation and management  ASSESSMENT:  Pt with PMH significant of intellectual disability, bipolar disorder, seizure disorder, type 2 DM, HTN, HLD, immunodeficiency disorder presented to the ED for change in mental status as reported by patient's sister for the past couple of days at home. CT head, MRI brain, EEG, and other neurologic work-up have been negative. Abdominal x-ray showed moderate stool burden. Pt does have h/o constipation and was recommended by GI to take Linzess and Dulcolax, though pt has been noncompliant. Pt has outpatient colonoscopy scheduled.  8/15 Cortrak placed (gastric tip)  Psychiatry is following, waiting on final psych recommendations before discharge disposition can be determined, though Cortrak will need to be discontinued prior to pt discharging. Mental status beginning to improve, attempting to talk. Pt considerably less agitated; restraints were discontinued yesterday. However, RN reports pt refused lab draw this morning.   PO intake remains inadequate, though this may improve now that mentation is improving. Recommend continuing to meet 100% of nutrition needs via TF over weekend while mental status continues to improve and while awaiting final psych recommendations. Pending pt's disposition and mentation, RD will then make  appropriate adjustment to TF regimen.   Current TF: Osmolite 1.2 @ 65ml/h92mth 45ml Pr37mrce TF daily and 100ml fre34mter Q4H  Given consistently elevated CBGs and no tolerance issues with current TF regimen, will transition pt to Glucerna TF.   Medications: dulcolax, SSI, Semglee, remeron, mvi with minerals, miralax Labs reviewed. CBGs [phone] Coordinator following)  Generalized mild pitting edema and non-pitting edema to LUE and RLE noted per RN edema assessment  Diet Order:   Diet Order             Diet regular Room service appropriate? Yes; Fluid consistency: Thin  Diet effective now                  EDUCATION NEEDS:  Not appropriate for education at this time  Skin:  Skin Assessment: Reviewed RN Assessment  Last BM:  8/19  Height:  Ht Readings from Last 1 Encounters:  10/30/20 '5\' 1"'  (1.549 m)   Weight:  Wt Readings from Last 1 Encounters:  02/04/21 73.6 kg   BMI:  Body mass index is 30.66 kg/m.  Estimated Nutritional Needs:  Kcal:  1800-2000 Protein:  90-100 grams Fluid:  >1.8L/d    Laylia Mui AvLarkin Ina LDN (she/her/hers) RD pager number and weekend/on-call pager number located in [email or web address]

## 2021-02-04 NOTE — Progress Notes (Signed)
Pt refused morning labs. Will continue to monitor.

## 2021-02-04 NOTE — Consult Note (Addendum)
Psychiatry C/L Progress Note   [name]  MRN:  [id number] Reason for Consult:  Flat affect, refusal to eat, and medication management Referring Physician:  Gerlean Ren, MD Method of visit?: Face to Face    Principal Problem: Acute encephalopathy Diagnosis:  Principal Problem:   Acute encephalopathy Active Problems:   Hypoglycemia   Constipation   Bipolar disorder ([place])   Diabetes ([place])   AMS (altered mental status)   Poor appetite   Intellectual Disability   Subjective: [name] is a 55 year old female admitted for evaluation of acute mental status change and consulted to the psychiatry service for potential medication management in light of flat affect and refusal to eat. This morning, she was mildly drowsy but able to answer questions. Her responses to questions were largely appropriate but she still maintains, "I can't eat." She does have spontaneous conversation, which is an improvement. She is A&O to person, time, and place, and is excited about her birthday today. She is agreeable to having chocolate cake. She was sitting up in bed in soft bilateral wrist restraints with the cortrak placed in her nose, watching as the RN administered medications.   Her recent memory is mostly intact, as she says that her friend brought her a Big Mac a couple days ago and that prior to living with [place], she was in a group home, that she discontinued living at in March 2021. She could recall how long she stayed there.   Patient ate 5 bites of her chocolate birthday cupcake, which she says was really good. She refused to drink anything. [name] says that she does not participate in any structured activities while at home; she just watches [place] and says that she no longer goes shopping with [name]. She has not participated in activities since she left her group home.    Treatment Plan Summary: [name] is largely appropriate in her responses and much less sedated today after Seroquel  discontinued and a decrease in Haldol. She appears to be tolerating the current dosages of medication well. [name]'s mood was not low before being started on Remeron and continues to be "good," so her sx are less likely due to major depression nor a depressive phase of bipolar disorder. They are more likely due to her intellectual disability, in which she is able to perform most tasks without or with minimal assistance and has some maladaptive behaviors.   IDD symptomology: Food refusal/ psychogenic loss of appetite  -Continue Haldol 2.5 mg IV qAM and 5 mg qHS IV. Qtc 445.  -Continue Cogentin 1 mg IV daily. -Continue Remeron 7.5 mg qHS for appetite stimulation -Recommend group home placement or adult daycare. Sx may be due to lack of structured activities. SW please assist.   Disposition: Psychiatry will continue to follow.  [name], MD PGY-1 02/04/2021 [place] Department of Psychiatry

## 2021-02-04 NOTE — Progress Notes (Signed)
Inpatient Diabetes Program Recommendations  AACE/ADA: New Consensus Statement on Inpatient Glycemic Control (2015)  Target Ranges:  Prepandial:   less than 140 mg/dL      Peak postprandial:   less than 180 mg/dL (1-2 hours)      Critically ill patients:  140 - 180 mg/dL   Lab Results  Component Value Date   GLUCAP 239 (H) 02/04/2021   HGBA1C 6.4 (H) 01/23/2021    Review of Glycemic Control Results for [name], [name] (MRN [id number]) as of 02/04/2021 10:52  Ref. Range 02/03/2021 22:52 02/04/2021 00:16 02/04/2021 04:09 02/04/2021 08:30  Glucose-Capillary Latest Ref Range: 70 - 99 mg/dL 384 (H) 536 (H) 468 (H) 239 (H)    Diabetes history: DM2 Outpatient Diabetes medications:  Lantus 40 units q HS, Amaryl 2 mg daily, Novolog 1-7 units tid with meals Current orders for Inpatient glycemic control:  Novolog sensitive tid with meals and HS Semglee 25 units QD Osmolite 65 cc/ hr Inpatient Diabetes Program Recommendations:    Consider Novolog 3 units q 4 hours since patient is on tube feeds (to be stopped or held in the event tube feeds are stopped).  Anticipate need to DC basal once tube feeds discontinued. Discontinue Novolog 0-5 units QHS.  Thanks, Lujean Rave, MSN, RNC-OB Diabetes Coordinator [phone] (8a-5p)

## 2021-02-04 NOTE — Plan of Care (Signed)

## 2021-02-05 LAB — GLUCOSE, CAPILLARY
Glucose-Capillary: 225 mg/dL — ABNORMAL HIGH (ref 70–99)
Glucose-Capillary: 172 mg/dL — ABNORMAL HIGH (ref 70–99)
Glucose-Capillary: 260 mg/dL — ABNORMAL HIGH (ref 70–99)
Glucose-Capillary: 275 mg/dL — ABNORMAL HIGH (ref 70–99)
Glucose-Capillary: 193 mg/dL — ABNORMAL HIGH (ref 70–99)
Glucose-Capillary: 164 mg/dL — ABNORMAL HIGH (ref 70–99)

## 2021-02-05 NOTE — Plan of Care (Signed)

## 2021-02-05 NOTE — Progress Notes (Signed)
PROGRESS NOTE    [name]  HAL:[id number] DOB: 1965-06-20 DOA: 01/23/2021 PCP: Mirna Mires, MD   Chief Complain: Altered mental status  Brief Narrative: Patient is a 55 year old female with history of intellectual disability, bipolar disorder, seizure disorder, diabetes type 2, hypertension, hyperlipidemia, immunodeficiency disorder who presented to the emergency department from home for evaluation of altered mental status.  CT head was negative on admission.  Abdominal x-ray showed moderate stool burden and patient was confused.  MRI of the brain, EEG and other neurological work-up was negative.  She continues to have poor oral intake, lack of desire to eat/drink with flat affect.  Psychiatry consulted and following.  Started on Remeron,haldol,cogentin. Coretrack placed on 8/15 due to poor oral intake. Dietitian, psychiatry following.  Waiting for final psychiatric recommendation before disposition. Feeding tube needs  to be out before discharge  02/04/2021: Patient seen.  No new complaints.  Patient is not a particularly good historian.  Patient still has feeding tube in place.  02/05/2021: Patient seen.  No new changes.    Assessment & Plan:   Principal Problem:   Acute encephalopathy Active Problems:   Hypoglycemia   Constipation   Bipolar disorder (HCC)   Diabetes (HCC)   AMS (altered mental status)   Poor appetite   Acute encephalopathy/flat affect:  Most likely due to underlying psychiatric issue.  No focal neurological deficits.  Brain imagings negative for acute intracranial abnormalities.  EEG did not show any seizures.  UA negative, UDS negative.  Procalcitonin negative ruling out infectious process.  Vitamin B12, ammonia, TSH normal.  Psychiatry consulted and following.  Currently on Remeron, Seroquel.  She still is not coherent and does not participate that much with conversation but is apparently alert and oriented.Started on haldol  IV, cogentin.  Psychiatry  recommending to follow-up daily .  She might need inpatient psychiatric placement. We will discontinue restraints today ans see how she does. 02/05/2021: Patient hospitalized once for non-STEMI.  Patient will be back to her baseline.  History of seizure: Continue Keppra 1 g twice daily, converted to IV due to poor oral intake.  EEG did not show any seizure or epileptiform discharge.  Neurology was following.  Bipolar disorder: She was on  risperidone, trazodone at home.Psychiatry following 02/05/2021: No psychopathology indicative of bipolar disorder at the moment.    Diabetes /poor oral intake: She was intermittently hypoglycemic. Was on D5, now discontinued after putting feeding tube  Nutrition consulted and following.  Very poor oral intake.  Takes Amaryl, metformin, Lantus, sliding scale at home.  Hemoglobin A1c of 6.4.  Monitor blood sugars,now up .Restarted insulin.Dietician following.We have plan to remove cortrack when she has adequate oral intake 02/05/2021: Continue to optimize.  Blood sugar is in the 200 range.  Hypokalemia/hypomagnesemia: She was supplemented with potassium/magnesium and being monitored intermittently 02/04/2021: Last potassium check was checked on 02/03/2021, potassium was 4.5, magnesium was 2.1.  Continue to monitor closely.  Nutrition Problem: Inadequate oral intake Etiology: lethargy/confusion   DVT prophylaxis:Lovenox Code Status: Full Family Communication: Called and discussed with sister on phone on 02/02/21 Status is: Inpatient  Remains inpatient appropriate because:Unsafe d/c plan  Dispo: The patient is from: Home              Anticipated d/c is to: Home vs Inpatient psych              Patient currently is not medically stable to d/c.   Difficult to place patient No    Consultants:  Psychiatry, neurology  Procedures: None  Antimicrobials:  Anti-infectives (From admission, onward)    Start     Dose/Rate Route Frequency Ordered Stop   01/31/21  2215  valACYclovir (VALTREX) tablet 500 mg        500 mg Per Tube Daily at bedtime 01/31/21 2119     01/25/21 2200  valACYclovir (VALTREX) tablet 500 mg  Status:  Discontinued        500 mg Oral Daily at bedtime 01/25/21 0930 01/31/21 2119       Subjective: No significant history from patient.    Objective: Vitals:   02/04/21 2338 02/05/21 0300 02/05/21 0443 02/05/21 0836  BP: 114/76 120/68  121/72  Pulse: 94 93  99  Resp: 18 18  14   Temp: 98.3 F (36.8 C) 98.5 F (36.9 C)  98.5 F (36.9 C)  TempSrc: Oral Axillary  Oral  SpO2: 95% 96%  100%  Weight:   72.5 kg     Intake/Output Summary (Last 24 hours) at 02/05/2021 1132 Last data filed at 02/04/2021 1700 Gross per 24 hour  Intake 4331.08 ml  Output 1000 ml  Net 3331.08 ml    Filed Weights   02/02/21 0435 02/04/21 0452 02/05/21 0443  Weight: 70 kg 73.6 kg 72.5 kg    Examination:  General exam: Opens her eyes on calling her name, did not talk that much, looks overall comfortable, not agitated HEENT: cortrack tube Respiratory system:  no wheezes or crackles  Cardiovascular system: S1 & S2 heard, with increased S2 component of the heart sound.    Gastrointestinal system: Abdomen is nondistended, soft and nontender. Central nervous system: awake,obeys commands.  Patient moves all extremities. Extremities: No edema, no clubbing ,no cyanosis Skin: No rashes, no ulcers,no icterus       Data Reviewed: I have personally reviewed following labs and imaging studies  CBC: No results for input(s): WBC, NEUTROABS, HGB, HCT, MCV, PLT in the last 168 hours.  Basic Metabolic Panel: Recent Labs  Lab 01/30/21 0546 02/01/21 0015 02/02/21 0110 02/03/21 0304  NA  --  140 137 136  K  --  3.4* 4.2 4.5  CL  --  104 104 98  CO2  --  26 25 28   GLUCOSE  --  206* 310* 249*  BUN  --  <5* 5* 10  CREATININE  --  0.67 0.67 0.72  CALCIUM  --  9.8 9.6 9.8  MG 2.1 2.0 2.0 2.1  PHOS 3.5 3.7 3.3 4.1    GFR: Estimated Creatinine  Clearance: 71.5 mL/min (by C-G formula based on SCr of 0.72 mg/dL). Liver Function Tests: No results for input(s): AST, ALT, ALKPHOS, BILITOT, PROT, ALBUMIN in the last 168 hours.  No results for input(s): LIPASE, AMYLASE in the last 168 hours.  No results for input(s): AMMONIA in the last 168 hours.  Coagulation Profile: No results for input(s): INR, PROTIME in the last 168 hours.  Cardiac Enzymes: No results for input(s): CKTOTAL, CKMB, CKMBINDEX, TROPONINI in the last 168 hours. BNP (last 3 results) No results for input(s): PROBNP in the last 8760 hours. HbA1C: No results for input(s): HGBA1C in the last 72 hours. CBG: Recent Labs  Lab 02/04/21 1629 02/04/21 2038 02/05/21 0117 02/05/21 0433 02/05/21 0837  GLUCAP 237* 218* 225* 172* 260*    Lipid Profile: No results for input(s): CHOL, HDL, LDLCALC, TRIG, CHOLHDL, LDLDIRECT in the last 72 hours. Thyroid Function Tests: No results for input(s): TSH, T4TOTAL, FREET4, T3FREE, THYROIDAB in the last 72  hours. Anemia Panel: No results for input(s): VITAMINB12, FOLATE, FERRITIN, TIBC, IRON, RETICCTPCT in the last 72 hours. Sepsis Labs: No results for input(s): PROCALCITON, LATICACIDVEN in the last 168 hours.   No results found for this or any previous visit (from the past 240 hour(s)).        Radiology Studies: No results found.      Scheduled Meds:  benztropine mesylate  1 mg Intravenous Daily   bisacodyl  10 mg Rectal Q1500   enoxaparin (LOVENOX) injection  40 mg Subcutaneous Q24H   free water  100 mL Per Tube Q4H   haloperidol lactate  2.5 mg Intravenous QAC breakfast   haloperidol lactate  5 mg Intravenous QHS   insulin aspart  0-5 Units Subcutaneous QHS   insulin aspart  0-9 Units Subcutaneous Q4H   insulin glargine-yfgn  25 Units Subcutaneous Daily   levETIRAcetam  1,000 mg Per Tube BID   mirtazapine  7.5 mg Per Tube QHS   multivitamin with minerals  1 tablet Per Tube Daily   polyethylene glycol  17 g  Per Tube BID   sodium chloride flush  3 mL Intravenous Once   valACYclovir  500 mg Per Tube QHS   Continuous Infusions:  feeding supplement (GLUCERNA 1.2 CAL) 1,000 mL (02/04/21 1433)     LOS: 11 days    Time spent: 25 mins,More than 50% of that time was spent in counseling and/or coordination of care.      Barnetta Chapel, MD [facility] P8/20/2022, 11:32 AM

## 2021-02-06 LAB — GLUCOSE, CAPILLARY
Glucose-Capillary: 137 mg/dL — ABNORMAL HIGH (ref 70–99)
Glucose-Capillary: 161 mg/dL — ABNORMAL HIGH (ref 70–99)
Glucose-Capillary: 162 mg/dL — ABNORMAL HIGH (ref 70–99)
Glucose-Capillary: 170 mg/dL — ABNORMAL HIGH (ref 70–99)
Glucose-Capillary: 207 mg/dL — ABNORMAL HIGH (ref 70–99)
Glucose-Capillary: 208 mg/dL — ABNORMAL HIGH (ref 70–99)
Glucose-Capillary: 220 mg/dL — ABNORMAL HIGH (ref 70–99)

## 2021-02-06 MED ORDER — MAGNESIUM HYDROXIDE 400 MG/5ML PO SUSP: 15.0000 mL | Freq: Every day | ORAL | Status: DC | PRN

## 2021-02-06 NOTE — Progress Notes (Signed)
Patient continue to refused oral intake when RN attempt to feed her. Pt w/ large brown soft stool and 1 large incontinent episode. Pericare provided. Will continue to round and maintain awakefullness with lights on and blind opens.

## 2021-02-06 NOTE — Plan of Care (Signed)
  Problem: Education: Goal: Knowledge of General Education information will improve Description: Including pain rating scale, medication(s)/side effects and non-pharmacologic comfort measures Outcome: Not Progressing   Problem: Health Behavior/Discharge Planning: Goal: Ability to manage health-related needs will improve Outcome: Not Progressing   

## 2021-02-06 NOTE — Progress Notes (Addendum)
0800 Patient refused oral intake along with lab collection this AM.  1000 Patient is alert to self and place, able to verbalized to RN the reason why she is in restraints b/c "im fighting people". Patient c/o left arm pain and inability to move it but with restraints off, pt can move extremity purposefully. Pt unable to describe severity or description of pain. Oral care, face washed, peri care provided with reposition.   Nursing POC: Frequent rounds   Monitor restraints and pain   Provide emotional support

## 2021-02-06 NOTE — Progress Notes (Signed)
PROGRESS NOTE    [name]  QIH:[id number] DOB: [birth year] DOA: 01/23/2021 PCP: Mirna Mires, MD   Chief Complain: Altered mental status  Brief Narrative: Patient is a 55 year old female with history of intellectual disability, bipolar disorder, seizure disorder, diabetes type 2, hypertension, hyperlipidemia, immunodeficiency disorder who presented to the emergency department from home for evaluation of altered mental status.  CT head was negative on admission.  Abdominal x-ray showed moderate stool burden and patient was confused.  MRI of the brain, EEG and other neurological work-up was negative.  She continues to have poor oral intake, lack of desire to eat/drink with flat affect.  Psychiatry consulted and following.  Started on Remeron,haldol,cogentin. Coretrack placed on 8/15 due to poor oral intake. Dietitian, psychiatry following.  Waiting for final psychiatric recommendation before disposition. Feeding tube needs  to be out before discharge  02/04/2021: Patient seen.  No new complaints.  Patient is not a particularly good historian.  Patient still has feeding tube in place.  02/06/2021: Patient seen.  No new changes.    Assessment & Plan:   Principal Problem:   Acute encephalopathy Active Problems:   Hypoglycemia   Constipation   Bipolar disorder (HCC)   Diabetes (HCC)   AMS (altered mental status)   Poor appetite   Acute encephalopathy/flat affect:  Most likely due to underlying psychiatric issue.  No focal neurological deficits.  Brain imagings negative for acute intracranial abnormalities.  EEG did not show any seizures.  UA negative, UDS negative.  Procalcitonin negative ruling out infectious process.  Vitamin B12, ammonia, TSH normal.  Psychiatry consulted and following.  Currently on Remeron, Seroquel.  She still is not coherent and does not participate that much with conversation but is apparently alert and oriented.Started on haldol  IV, cogentin.  Psychiatry  recommending to follow-up daily .  She might need inpatient psychiatric placement. We will discontinue restraints today ans see how she does. 02/06/2021: Patient remains on restraints.  No significant oral intake.  Mental status has remained stable.  Uncertain about patient's baseline mental status.  History of seizure: Continue Keppra 1 g twice daily, converted to IV due to poor oral intake.  EEG did not show any seizure or epileptiform discharge.  Neurology was following.  Bipolar disorder: She was on  risperidone, trazodone at home.Psychiatry following 02/06/2021: No psychopathology indicative of bipolar disorder at the moment.    Diabetes /poor oral intake: She was intermittently hypoglycemic. Was on D5, now discontinued after putting feeding tube  Nutrition consulted and following.  Very poor oral intake.  Takes Amaryl, metformin, Lantus, sliding scale at home.  Hemoglobin A1c of 6.4.  Monitor blood sugars,now up .Restarted insulin.Dietician following.We have plan to remove cortrack when she has adequate oral intake 02/05/2021: Continue to optimize.  Blood sugar is in the 200 range.  Hypokalemia/hypomagnesemia: She was supplemented with potassium/magnesium and being monitored intermittently 02/04/2021: Last potassium check was checked on 02/03/2021, potassium was 4.5, magnesium was 2.1.  Continue to monitor closely.  Nutrition Problem: Inadequate oral intake Etiology: lethargy/confusion   DVT prophylaxis:Lovenox Code Status: Full Family Communication:  Status is: Inpatient  Remains inpatient appropriate because:Unsafe d/c plan  Dispo: The patient is from: Home              Anticipated d/c is to: Home vs Inpatient psych              Patient currently is not medically stable to d/c.   Difficult to place patient No  Consultants: Psychiatry, neurology  Procedures: None  Antimicrobials:  Anti-infectives (From admission, onward)    Start     Dose/Rate Route Frequency Ordered Stop    01/31/21 2215  valACYclovir (VALTREX) tablet 500 mg        500 mg Per Tube Daily at bedtime 01/31/21 2119     01/25/21 2200  valACYclovir (VALTREX) tablet 500 mg  Status:  Discontinued        500 mg Oral Daily at bedtime 01/25/21 0930 01/31/21 2119       Subjective: No significant history from patient.    Objective: Vitals:   02/06/21 0426 02/06/21 0500 02/06/21 0738 02/06/21 1556  BP: 133/76  (!) 130/59 (!) 110/53  Pulse: 86  87 85  Resp: 18  14 16   Temp: (!) 97.5 F (36.4 C)  97.7 F (36.5 C) 98.3 F (36.8 C)  TempSrc: Oral  Oral Oral  SpO2: 100%  98% 98%  Weight:  72.4 kg      Intake/Output Summary (Last 24 hours) at 02/06/2021 1609 Last data filed at 02/06/2021 1237 Gross per 24 hour  Intake 2204.75 ml  Output 950 ml  Net 1254.75 ml    Filed Weights   02/04/21 0452 02/05/21 0443 02/06/21 0500  Weight: 73.6 kg 72.5 kg 72.4 kg    Examination:  General exam: Opens her eyes on calling her name, did not talk that much, looks overall comfortable, not agitated HEENT: cortrack tube Respiratory system:  no wheezes or crackles  Cardiovascular system: S1 & S2 heard, with increased S2 component of the heart sound.    Gastrointestinal system: Abdomen is nondistended, soft and nontender. Central nervous system: awake,obeys commands.  Patient moves all extremities. Extremities: No edema, no clubbing ,no cyanosis Skin: No rashes, no ulcers,no icterus       Data Reviewed: I have personally reviewed following labs and imaging studies  CBC: No results for input(s): WBC, NEUTROABS, HGB, HCT, MCV, PLT in the last 168 hours.  Basic Metabolic Panel: Recent Labs  Lab 02/01/21 0015 02/02/21 0110 02/03/21 0304  NA 140 137 136  K 3.4* 4.2 4.5  CL 104 104 98  CO2 26 25 28   GLUCOSE 206* 310* 249*  BUN <5* 5* 10  CREATININE 0.67 0.67 0.72  CALCIUM 9.8 9.6 9.8  MG 2.0 2.0 2.1  PHOS 3.7 3.3 4.1    GFR: Estimated Creatinine Clearance: 71.4 mL/min (by C-G formula based  on SCr of 0.72 mg/dL). Liver Function Tests: No results for input(s): AST, ALT, ALKPHOS, BILITOT, PROT, ALBUMIN in the last 168 hours.  No results for input(s): LIPASE, AMYLASE in the last 168 hours.  No results for input(s): AMMONIA in the last 168 hours.  Coagulation Profile: No results for input(s): INR, PROTIME in the last 168 hours.  Cardiac Enzymes: No results for input(s): CKTOTAL, CKMB, CKMBINDEX, TROPONINI in the last 168 hours. BNP (last 3 results) No results for input(s): PROBNP in the last 8760 hours. HbA1C: No results for input(s): HGBA1C in the last 72 hours. CBG: Recent Labs  Lab 02/06/21 0002 02/06/21 0426 02/06/21 0741 02/06/21 1151 02/06/21 1553  GLUCAP 161* 162* 207* 220* 137*    Lipid Profile: No results for input(s): CHOL, HDL, LDLCALC, TRIG, CHOLHDL, LDLDIRECT in the last 72 hours. Thyroid Function Tests: No results for input(s): TSH, T4TOTAL, FREET4, T3FREE, THYROIDAB in the last 72 hours. Anemia Panel: No results for input(s): VITAMINB12, FOLATE, FERRITIN, TIBC, IRON, RETICCTPCT in the last 72 hours. Sepsis Labs: No results for input(s):  PROCALCITON, LATICACIDVEN in the last 168 hours.   No results found for this or any previous visit (from the past 240 hour(s)).        Radiology Studies: No results found.      Scheduled Meds:  benztropine mesylate  1 mg Intravenous Daily   bisacodyl  10 mg Rectal Q1500   enoxaparin (LOVENOX) injection  40 mg Subcutaneous Q24H   free water  100 mL Per Tube Q4H   haloperidol lactate  2.5 mg Intravenous QAC breakfast   haloperidol lactate  5 mg Intravenous QHS   insulin aspart  0-5 Units Subcutaneous QHS   insulin aspart  0-9 Units Subcutaneous Q4H   insulin glargine-yfgn  25 Units Subcutaneous Daily   levETIRAcetam  1,000 mg Per Tube BID   mirtazapine  7.5 mg Per Tube QHS   multivitamin with minerals  1 tablet Per Tube Daily   polyethylene glycol  17 g Per Tube BID   sodium chloride flush  3 mL  Intravenous Once   valACYclovir  500 mg Per Tube QHS   Continuous Infusions:  feeding supplement (GLUCERNA 1.2 CAL) 1,000 mL (02/06/21 0425)     LOS: 12 days    Time spent: 25 mins,More than 50% of that time was spent in counseling and/or coordination of care.      Barnetta Chapel, MD [facility] P8/21/2022, 4:09 PM

## 2021-02-07 LAB — GLUCOSE, CAPILLARY
Glucose-Capillary: 216 mg/dL — ABNORMAL HIGH (ref 70–99)
Glucose-Capillary: 240 mg/dL — ABNORMAL HIGH (ref 70–99)
Glucose-Capillary: 235 mg/dL — ABNORMAL HIGH (ref 70–99)
Glucose-Capillary: 233 mg/dL — ABNORMAL HIGH (ref 70–99)
Glucose-Capillary: 125 mg/dL — ABNORMAL HIGH (ref 70–99)
Glucose-Capillary: 162 mg/dL — ABNORMAL HIGH (ref 70–99)

## 2021-02-07 MED ORDER — INSULIN GLARGINE-YFGN 100 UNIT/ML ~~LOC~~ SOLN
35.0000 [IU] | Freq: Every day | SUBCUTANEOUS | Status: DC
  Administered 2021-02-07: 35 [IU] via SUBCUTANEOUS
  Filled 2021-02-07 (×2): qty 0.35

## 2021-02-07 MED ORDER — HALOPERIDOL LACTATE 5 MG/ML IJ SOLN
10.0000 mg | Freq: Every day | INTRAMUSCULAR | Status: DC
  Administered 2021-02-07 – 2021-02-08 (×2): 10 mg via INTRAVENOUS
  Filled 2021-02-07 (×2): qty 2

## 2021-02-07 NOTE — Plan of Care (Signed)
  Problem: Education: Goal: Knowledge of General Education information will improve Description: Including pain rating scale, medication(s)/side effects and non-pharmacologic comfort measures Outcome: Progressing   Problem: Clinical Measurements: Goal: Ability to maintain clinical measurements within normal limits will improve Outcome: Progressing Goal: Will remain free from infection Outcome: Progressing Goal: Diagnostic test results will improve Outcome: Progressing Goal: Respiratory complications will improve Outcome: Progressing Goal: Cardiovascular complication will be avoided Outcome: Progressing   Problem: Elimination: Goal: Will not experience complications related to bowel motility Outcome: Progressing Goal: Will not experience complications related to urinary retention Outcome: Progressing   Problem: Safety: Goal: Non-violent Restraint(s) Outcome: Progressing

## 2021-02-07 NOTE — Progress Notes (Signed)
Pt refused to allow phlebotomy to draw blood for labs; repeatedly shouting "I don't have any bloodwork!"

## 2021-02-07 NOTE — Progress Notes (Signed)
[facility] PROGRESS NOTE    [name]  VZC:[id number] DOB: September 25, 1965 DOA: 01/23/2021 PCP: Mirna Mires, MD      Brief Narrative:  [name] is a 55 y.o. F with DM, HTN, developmental delay who presented with confusion and not eating and "saying bizarre things".   In the ER, CT head unremarkable.  She was hypoglycemic and otherwise had normal chemistries and hemogram.  Admitted for confusion.      Assessment & Plan:  Acute metabolic encephalopathy ruled out Decompensated bipolar disorder with paranoia Brain imaging unremarkable.  EEG unremarkable.  UA negative, urine drug screen negative, procalcitonin low.  B12, ammonia, TSH normal.  -Consult psychiatry, appreciate cares - Continue benztropine, Haldol, mirtazapine  Inadequate oral intake This seems to be psychogenic.  Discussed with Psychiatry today, they are recommending PEG tube.   We will meet with family tomorrow   Seizures No seizures -Continue Keppra    Diabetes Glucose elevated - Continue glargine, sliding scale corrections  Hypokalemia Hypomagnesemia Replete        Disposition: Status is: Inpatient  Remains inpatient appropriate because:IV treatments appropriate due to intensity of illness or inability to take PO  Dispo: The patient is from: Home              Anticipated d/c is to: SNF              Patient currently is not medically stable to d/c.   Difficult to place patient No       Level of care: Med-Surg       MDM: The below labs and imaging reports were reviewed and summarized above.  Medication management as above.    DVT prophylaxis: enoxaparin (LOVENOX) injection 40 mg Start: 01/24/21 1000  Code Status: FULL Family Communication:              Subjective: Patient irritable.  She has no complaints.  She says she is not hungry.  Objective: Vitals:   02/07/21 0431 02/07/21 1228 02/07/21 1609 02/07/21 1940  BP:  116/63 127/64 (!) 125/51   Pulse:  88 88 95  Resp:  17 16 17   Temp:  97.7 F (36.5 C) 98.3 F (36.8 C) 98.6 F (37 C)  TempSrc:  Oral Axillary Oral  SpO2:  98% 99% 99%  Weight: 73.4 kg       Intake/Output Summary (Last 24 hours) at 02/07/2021 2026 Last data filed at 02/07/2021 1930 Gross per 24 hour  Intake --  Output 600 ml  Net -600 ml   Filed Weights   02/05/21 0443 02/06/21 0500 02/07/21 0431  Weight: 72.5 kg 72.4 kg 73.4 kg    Examination: General appearance:  adult female, alert and in no obvious distress.   HEENT: Anicteric, conjunctiva pink, lids and lashes normal. No nasal deformity, discharge, epistaxis.  Lips moist.   Skin: Warm and dry.  no jaundice.  No suspicious rashes or lesions. Cardiac: RRR, nl S1-S2, no murmurs appreciated.  Capillary refill is brisk.  JVP normal.  No LE edema.  Radial  pulses 2+ and symmetric. Respiratory: Normal respiratory rate and rhythm.  CTAB without rales or wheezes. Abdomen: Abdomen soft.  no TTP or guarding. No ascites, distension, hepatosplenomegaly.   MSK: No deformities or effusions. Neuro: Awake and alert.  EOMI, moves all extremities. Speech fluent.    Psych: Sensorium intact and responding to questions, attention normal, affect irritable, judgment insight appear impaired      Data Reviewed: I have personally reviewed  following labs and imaging studies:  CBC: No results for input(s): WBC, NEUTROABS, HGB, HCT, MCV, PLT in the last 168 hours. Basic Metabolic Panel: Recent Labs  Lab 02/01/21 0015 02/02/21 0110 02/03/21 0304  NA 140 137 136  K 3.4* 4.2 4.5  CL 104 104 98  CO2 26 25 28   GLUCOSE 206* 310* 249*  BUN <5* 5* 10  CREATININE 0.67 0.67 0.72  CALCIUM 9.8 9.6 9.8  MG 2.0 2.0 2.1  PHOS 3.7 3.3 4.1   GFR: Estimated Creatinine Clearance: 71.9 mL/min (by C-G formula based on SCr of 0.72 mg/dL). Liver Function Tests: No results for input(s): AST, ALT, ALKPHOS, BILITOT, PROT, ALBUMIN in the last 168 hours. No results for input(s):  LIPASE, AMYLASE in the last 168 hours. No results for input(s): AMMONIA in the last 168 hours. Coagulation Profile: No results for input(s): INR, PROTIME in the last 168 hours. Cardiac Enzymes: No results for input(s): CKTOTAL, CKMB, CKMBINDEX, TROPONINI in the last 168 hours. BNP (last 3 results) No results for input(s): PROBNP in the last 8760 hours. HbA1C: No results for input(s): HGBA1C in the last 72 hours. CBG: Recent Labs  Lab 02/07/21 0524 02/07/21 0849 02/07/21 1227 02/07/21 1611 02/07/21 1956  GLUCAP 216* 240* 235* 233* 125*   Lipid Profile: No results for input(s): CHOL, HDL, LDLCALC, TRIG, CHOLHDL, LDLDIRECT in the last 72 hours. Thyroid Function Tests: No results for input(s): TSH, T4TOTAL, FREET4, T3FREE, THYROIDAB in the last 72 hours. Anemia Panel: No results for input(s): VITAMINB12, FOLATE, FERRITIN, TIBC, IRON, RETICCTPCT in the last 72 hours. Urine analysis:    Component Value Date/Time   COLORURINE YELLOW 01/24/2021 0907   APPEARANCEUR CLEAR 01/24/2021 0907   LABSPEC 1.021 01/24/2021 0907   PHURINE 5.0 01/24/2021 0907   GLUCOSEU 150 (A) 01/24/2021 0907   HGBUR NEGATIVE 01/24/2021 0907   BILIRUBINUR NEGATIVE 01/24/2021 0907   KETONESUR 20 (A) 01/24/2021 0907   PROTEINUR NEGATIVE 01/24/2021 0907   UROBILINOGEN 0.2 04/27/2010 1438   NITRITE NEGATIVE 01/24/2021 0907   LEUKOCYTESUR NEGATIVE 01/24/2021 0907   Sepsis Labs: @LABRCNTIP (procalcitonin:4,lacticacidven:4)  )No results found for this or any previous visit (from the past 240 hour(s)).       Radiology Studies: No results found.      Scheduled Meds:  benztropine mesylate  1 mg Intravenous Daily   bisacodyl  10 mg Rectal Q1500   enoxaparin (LOVENOX) injection  40 mg Subcutaneous Q24H   free water  100 mL Per Tube Q4H   haloperidol lactate  10 mg Intravenous QHS   haloperidol lactate  2.5 mg Intravenous QAC breakfast   insulin aspart  0-5 Units Subcutaneous QHS   insulin aspart  0-9  Units Subcutaneous Q4H   insulin glargine-yfgn  35 Units Subcutaneous Daily   levETIRAcetam  1,000 mg Per Tube BID   mirtazapine  7.5 mg Per Tube QHS   multivitamin with minerals  1 tablet Per Tube Daily   polyethylene glycol  17 g Per Tube BID   sodium chloride flush  3 mL Intravenous Once   valACYclovir  500 mg Per Tube QHS   Continuous Infusions:  feeding supplement (GLUCERNA 1.2 CAL) 1,000 mL (02/07/21 2008)     LOS: 13 days    Time spent: 35 minutes    , MD [facility] 02/07/2021, 8:26 PM     Please page though AMION or Epic secure chat:  For Alberteen Sam, 02/09/2021

## 2021-02-07 NOTE — Consult Note (Signed)
Psychiatry C/L Progress Note:  [name]  MRN:  [id number] Reason for Consult:  Flat affect, refusal to eat, and medication management Referring Physician:  Stephania Fragmin, MD Method of visit?: Face to Face    Principal Problem: Acute encephalopathy Diagnosis:  Principal Problem:   Acute encephalopathy Active Problems:   Hypoglycemia   Constipation   Bipolar disorder (HCC)   Diabetes (HCC)   AMS (altered mental status)   Poor appetite   Intellectual Disability   Subjective: [name] is a 55 year old female admitted for evaluation of acute mental status change and consulted to the psychiatry service for potential medication management in light of flat affect and refusal to eat. This morning, she was mildly drowsy but able to answer questions. She is A&O to person, time, and place, and she answers questions appropriately. She was sitting up in bed in soft bilateral wrist restraints with the Cortrak placed in her nose. This morning, she is doing "okay," still refusing to eat because "I don't want to." When asked if something happened to make her not want to eat, she started to answer but hesitated before repeating she doesn't want to. She says that she was going to the [facility] 5x/ week to do chores there. She denies active SI but when this writer informs that she could die from not eating, she says "I really don't care" because "I don't want to be bothered." She denies HI/AVH.  Spoke to sister, [name]: She noticed that [name]'s left eye was not fully opening on Friday; had not noticed before. [name] was going to a Day program on [employer] ([facility]) from 8:30-2:30 (stopped going in June when she and her sister moved). She went a few times in July, but not consistently due to transportation issues. On Friday during [name]'s visit, Dajai told her that she ate burger meat from a [employer]. At that time she stated she was in "No pain, just doomed." If she begins  eating, [name] wants for her to come home; she was not treated well at group home. If she's not eating, [name] doesn't feel as though she is equipped to care for her.   Treatment Plan Summary: Although she continues to refuse to eat, Berneta is appropriate in her responses today. She appears to be tolerating the current dosages of medication well. Her symptoms are likely due to her intellectual disability, in which she is able to perform most tasks without or with minimal assistance and has some maladaptive behaviors.   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 Qtc 445.  -Continue Cogentin 1 mg IV daily. -Continue Remeron 7.5 mg qHS for appetite stimulation -Recommend team meeting with patient, family, psychiatry, SW, and primary team to discuss goals of care and placement, as patient is likely to need a PEG tube placed and possible nursing home placement.     Disposition: Psychiatry will continue to follow.  Lamar Sprinkles, MD PGY-1 02/07/2021 [facility] Department of Psychiatry

## 2021-02-08 DIAGNOSIS — E876 Hypokalemia: Secondary | ICD-10-CM

## 2021-02-08 LAB — GLUCOSE, CAPILLARY
Glucose-Capillary: 126 mg/dL — ABNORMAL HIGH (ref 70–99)
Glucose-Capillary: 167 mg/dL — ABNORMAL HIGH (ref 70–99)
Glucose-Capillary: 169 mg/dL — ABNORMAL HIGH (ref 70–99)
Glucose-Capillary: 223 mg/dL — ABNORMAL HIGH (ref 70–99)
Glucose-Capillary: 235 mg/dL — ABNORMAL HIGH (ref 70–99)
Glucose-Capillary: 269 mg/dL — ABNORMAL HIGH (ref 70–99)

## 2021-02-08 MED ORDER — INSULIN GLARGINE-YFGN 100 UNIT/ML ~~LOC~~ SOLN
40.0000 [IU] | Freq: Every day | SUBCUTANEOUS | Status: DC
  Administered 2021-02-08 – 2021-02-09 (×2): 40 [IU] via SUBCUTANEOUS
  Filled 2021-02-08 (×2): qty 0.4

## 2021-02-08 NOTE — Progress Notes (Signed)
[facility] PROGRESS NOTE    [name]  EXN:[id number] DOB: 1966/06/09 DOA: 01/23/2021 PCP: Mirna Mires, MD      Brief Narrative:  [name] is a 55 y.o. F with DM, HTN, developmental delay who presented with confusion and not eating and "saying bizarre things".  In the ER, CT head unremarkable.  She was hypoglycemic and otherwise had normal chemistries and hemogram.  Admitted for confusion.        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  Brain imaging unremarkable.  EEG unremarkable.  UA negative, urine drug screen negative, procalcitonin low.  B12, ammonia, TSH normal.  Psychiatry were consulted who noted a temporal relationship between patient moving from structured group home setting to live with her sister, and subsequent presentation of her fixed delusions about eating.  They have recommended Haldol, benztropine, mirtazapine for medical therapy.  They have also recommended re-instituting a structured and familiar environment for Sudie, either in a new group home, in an ALF, or with sister.    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  Given that it has the potential to cause irrevocable harm (startvation/malnutrition) to [place], they recommend PEG tube - Psychiatry will contact GI regarding PEG placement - I will contact Ethics Cmte regarding PEG placement   Seizures No seizures -Continue Keppra    Diabetes Glucose normal - Continue glargine and SS corrections  Hypokalemia Hypomagnesemia Replete        Disposition: Status is: Inpatient  Remains inpatient appropriate because:IV treatments appropriate due to intensity of illness or inability to  take PO  Dispo: The patient is from: Home              Anticipated d/c is to: SNF              Patient currently is not medically stable to d/c.   Difficult to place patient No       Level of care: Med-Surg       MDM: The below labs and imaging reports were reviewed and summarized above.  Medication management as above.    DVT prophylaxis: enoxaparin (LOVENOX) injection 40 mg Start: 01/24/21 1000  Code Status: FULL Family Communication: Sister tonya             Subjective: Patient irritable.  She has no complaints.  She says she is not hungry.  Objective: Vitals:   02/08/21 0744 02/08/21 1136 02/08/21 1556 02/08/21 1946  BP: 124/63 (!) 139/58 (!) 146/72 126/83  Pulse: (!) 105 (!) 109  (!) 106  Resp: 14 14 12 19   Temp: 98.7 F (37.1 C) 98.8 F (37.1 C) 99.2 F (37.3 C) 99.3 F (37.4 C)  TempSrc: Oral Oral Oral Oral  SpO2: 95% 99% 100% 99%  Weight:        Intake/Output Summary (Last 24 hours) at 02/08/2021 2054 Last data filed at 02/08/2021 1800 Gross per 24 hour  Intake --  Output 1400 ml  Net -1400 ml   Filed Weights   02/06/21 0500 02/07/21 0431 02/08/21 0409  Weight: 72.4 kg 73.4 kg 72.6 kg    Examination: General appearance:  adult female, alert and in no obvious distress.   HEENT: Anicteric, conjunctiva pink, lids and lashes normal. No nasal deformity, discharge, epistaxis.  Lips moist.   Skin: Warm and dry.  no jaundice.  No suspicious rashes or lesions. Cardiac: RRR, nl S1-S2, no murmurs appreciated.  Capillary refill is brisk.  JVP normal.  No LE edema.  Radial  pulses 2+ and symmetric. Respiratory: Normal respiratory rate and rhythm.  CTAB without rales or wheezes. Abdomen: Abdomen soft.  no TTP or guarding. No ascites, distension, hepatosplenomegaly.   MSK: No deformities or effusions. Neuro: Awake and alert.  EOMI, moves all extremities. Speech fluent.    Psych: Sensorium intact and responding to questions, attention normal, affect  irritable, judgment insight appear impaired      Data Reviewed: I have personally reviewed following labs and imaging studies:  CBC: No results for input(s): WBC, NEUTROABS, HGB, HCT, MCV, PLT in the last 168 hours. Basic Metabolic Panel: Recent Labs  Lab 02/02/21 0110 02/03/21 0304  NA 137 136  K 4.2 4.5  CL 104 98  CO2 25 28  GLUCOSE 310* 249*  BUN 5* 10  CREATININE 0.67 0.72  CALCIUM 9.6 9.8  MG 2.0 2.1  PHOS 3.3 4.1   GFR: Estimated Creatinine Clearance: 71.5 mL/min (by C-G formula based on SCr of 0.72 mg/dL). Liver Function Tests: No results for input(s): AST, ALT, ALKPHOS, BILITOT, PROT, ALBUMIN in the last 168 hours. No results for input(s): LIPASE, AMYLASE in the last 168 hours. No results for input(s): AMMONIA in the last 168 hours. Coagulation Profile: No results for input(s): INR, PROTIME in the last 168 hours. Cardiac Enzymes: No results for input(s): CKTOTAL, CKMB, CKMBINDEX, TROPONINI in the last 168 hours. BNP (last 3 results) No results for input(s): PROBNP in the last 8760 hours. HbA1C: No results for input(s): HGBA1C in the last 72 hours. CBG: Recent Labs  Lab 02/08/21 0349 02/08/21 0745 02/08/21 1132 02/08/21 1553 02/08/21 1943  GLUCAP 235* 223* 269* 167* 126*   Lipid Profile: No results for input(s): CHOL, HDL, LDLCALC, TRIG, CHOLHDL, LDLDIRECT in the last 72 hours. Thyroid Function Tests: No results for input(s): TSH, T4TOTAL, FREET4, T3FREE, THYROIDAB in the last 72 hours. Anemia Panel: No results for input(s): VITAMINB12, FOLATE, FERRITIN, TIBC, IRON, RETICCTPCT in the last 72 hours. Urine analysis:    Component Value Date/Time   COLORURINE YELLOW 01/24/2021 0907   APPEARANCEUR CLEAR 01/24/2021 0907   LABSPEC 1.021 01/24/2021 0907   PHURINE 5.0 01/24/2021 0907   GLUCOSEU 150 (A) 01/24/2021 0907   HGBUR NEGATIVE 01/24/2021 0907   BILIRUBINUR NEGATIVE 01/24/2021 0907   KETONESUR 20 (A) 01/24/2021 0907   PROTEINUR NEGATIVE  01/24/2021 0907   UROBILINOGEN 0.2 04/27/2010 1438   NITRITE NEGATIVE 01/24/2021 0907   LEUKOCYTESUR NEGATIVE 01/24/2021 0907   Sepsis Labs: @LABRCNTIP (procalcitonin:4,lacticacidven:4)  )No results found for this or any previous visit (from the past 240 hour(s)).       Radiology Studies: No results found.      Scheduled Meds:  benztropine mesylate  1 mg Intravenous Daily   bisacodyl  10 mg Rectal Q1500   enoxaparin (LOVENOX) injection  40 mg Subcutaneous Q24H   free water  100 mL Per Tube Q4H   haloperidol lactate  10 mg Intravenous QHS   haloperidol lactate  2.5 mg Intravenous QAC breakfast   insulin aspart  0-5 Units Subcutaneous QHS   insulin aspart  0-9 Units Subcutaneous Q4H   insulin glargine-yfgn  40 Units Subcutaneous Daily   levETIRAcetam  1,000 mg Per Tube BID   mirtazapine  7.5 mg Per Tube QHS   multivitamin with minerals  1  tablet Per Tube Daily   polyethylene glycol  17 g Per Tube BID   sodium chloride flush  3 mL Intravenous Once   valACYclovir  500 mg Per Tube QHS   Continuous Infusions:  feeding supplement (GLUCERNA 1.2 CAL) 1,000 mL (02/08/21 1756)     LOS: 14 days    Time spent: 35 minutes    Alberteen Sam, MD [facility] 02/08/2021, 8:54 PM     Please page though AMION or Epic secure chat:  For [employer], Higher education careers adviser

## 2021-02-08 NOTE — Consult Note (Signed)
Psychiatry C/L Note: Plan of Care  Today, this Probation officer met with patient, patient's sister, [name], and primary attending Myrene Buddy MD to discuss the next steps for [name]'s care. This Probation officer explained to [name] that in IDD, it is not uncommon to develop psychogenic appetite loss/ food refusal. The typical treatment for it is Haldol. Sometimes patients will respond to it, and get rid of the delusion preventing food consumption (that she can't eat in her case) to begin eating again. Other times, it will not remove the delusions, and PEG tube placement is warranted for nutrition. The following options were discussed with [name]: [name] will be able to go home, and she will be educated on and given materials to assist with PEG tube feedings. [name] can go to a nursing home to have tube feedings managed.  [name] stated that she would like to sleep on her decision and will be given a call tomorrow morning.   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 She is otherwise concrete in her thinking, fully oriented, and denies SI/HI/AVH. Delusions of IDD alone do not meet criteria for an inpatient psychiatric admission.    [name], MD PGY-1 02/08/2021 [facility] Department of Psychiatry

## 2021-02-08 NOTE — Plan of Care (Signed)

## 2021-02-08 NOTE — Progress Notes (Signed)
Patient refused oral care this AM. Will continue to support.

## 2021-02-09 ENCOUNTER — Encounter (HOSPITAL_COMMUNITY): Payer: Self-pay | Admitting: Internal Medicine

## 2021-02-09 DIAGNOSIS — E876 Hypokalemia: Secondary | ICD-10-CM | POA: Diagnosis not present

## 2021-02-09 LAB — GLUCOSE, CAPILLARY
Glucose-Capillary: 191 mg/dL — ABNORMAL HIGH (ref 70–99)
Glucose-Capillary: 250 mg/dL — ABNORMAL HIGH (ref 70–99)
Glucose-Capillary: 252 mg/dL — ABNORMAL HIGH (ref 70–99)
Glucose-Capillary: 153 mg/dL — ABNORMAL HIGH (ref 70–99)
Glucose-Capillary: 205 mg/dL — ABNORMAL HIGH (ref 70–99)
Glucose-Capillary: 197 mg/dL — ABNORMAL HIGH (ref 70–99)

## 2021-02-09 LAB — MAGNESIUM: Magnesium: 2.3 mg/dL (ref 1.7–2.4)

## 2021-02-09 LAB — PHOSPHORUS: Phosphorus: 4.8 mg/dL — ABNORMAL HIGH (ref 2.5–4.6)

## 2021-02-09 MED ORDER — HALOPERIDOL 5 MG PO TABS
10.0000 mg | ORAL_TABLET | Freq: Every day | ORAL | Status: DC
  Administered 2021-02-09: 10 mg
  Filled 2021-02-09: qty 2

## 2021-02-09 MED ORDER — INSULIN ASPART 100 UNIT/ML IJ SOLN
0.0000 [IU] | INTRAMUSCULAR | Status: DC
  Administered 2021-02-09: 8 [IU] via SUBCUTANEOUS
  Administered 2021-02-09: 5 [IU] via SUBCUTANEOUS
  Administered 2021-02-09: 3 [IU] via SUBCUTANEOUS
  Administered 2021-02-10: 5 [IU] via SUBCUTANEOUS
  Administered 2021-02-10: 3 [IU] via SUBCUTANEOUS
  Administered 2021-02-10: 5 [IU] via SUBCUTANEOUS
  Administered 2021-02-10 (×2): 3 [IU] via SUBCUTANEOUS
  Administered 2021-02-11: 5 [IU] via SUBCUTANEOUS
  Administered 2021-02-11: 3 [IU] via SUBCUTANEOUS
  Administered 2021-02-11: 5 [IU] via SUBCUTANEOUS

## 2021-02-09 MED ORDER — HALOPERIDOL 0.5 MG PO TABS
2.5000 mg | ORAL_TABLET | Freq: Every day | ORAL | Status: DC
  Administered 2021-02-10 – 2021-02-11 (×2): 2.5 mg
  Filled 2021-02-09 (×3): qty 5

## 2021-02-09 MED ORDER — INSULIN GLARGINE-YFGN 100 UNIT/ML ~~LOC~~ SOLN
22.0000 [IU] | Freq: Two times a day (BID) | SUBCUTANEOUS | Status: DC
  Administered 2021-02-09 – 2021-02-10 (×2): 22 [IU] via SUBCUTANEOUS
  Filled 2021-02-09 (×3): qty 0.22

## 2021-02-09 MED ORDER — BENZTROPINE MESYLATE 1 MG PO TABS
1.0000 mg | ORAL_TABLET | Freq: Every day | ORAL | Status: DC
  Administered 2021-02-10 – 2021-02-19 (×9): 1 mg
  Filled 2021-02-09 (×10): qty 1

## 2021-02-09 NOTE — Progress Notes (Signed)
[facility] PROGRESS NOTE    [name]  WLN:[id number] DOB: 1966/03/10 DOA: 01/23/2021 PCP: Mirna Mires, MD      Brief Narrative:  [name] is a 55 y.o. F with DM, HTN, developmental delay who presented with confusion and not eating and "saying bizarre things".  In the ER, CT head unremarkable.  She was hypoglycemic and otherwise had normal chemistries and hemogram.  Admitted for confusion.          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  Brain imaging unremarkable.  EEG unremarkable.  UA negative, urine drug screen negative, procalcitonin low.  B12, ammonia, TSH normal. Acute metabolic encephalopathy ruled out.  Psychiatry were consulted who noted a temporal relationship between patient moving from structured group home setting to live with her sister, and subsequent presentation of her fixed delusions about eating and then cessation of oral intake.  They have recommended Haldol, benztropine, mirtazapine for medical therapy.  They have also recommended re-instituting a structured and familiar environment for [place], either in a new group home, in an ALF, or with sister.    - Consult psychiatry, appreciate cares - Continue mirtazapine, Haldol, benztropine     Inadequate oral intake due to fixed delusion Psychiatry suspect this delusion will be prolonged, maybe months or longer.    In the setting of possibly months of no oral intake, alternative means of fluids and nutrition must be sought.  She would have long term medical harm (likely starvation and death) if we did nothing.  It is my medical opinion that enteral nutrition via PEG is safer than TPN, IV fluids, or prolonged NG tube, and her most beneficent therapy.  Her POA agrees.  I have spoken with Ethics.  We are making all efforts to provide optimal management of her potential PEG and tube feeds, and are working  towards disposition to a monitored setting with SW.  Given the patient may not assent, an important mitigation of risk will be to consider PEG tube hardwae that cause minimal harm if removed.  Psychiatry have consulted GI for PEG placement.     Seizures Has seizure disorder, well controlled here - Continue Keppra    Diabetes Glucoses somewhat high - Continue glargine, increase dose and split - Increase SS corrections   Obesity BMI >30  Hypokalemia Hypomagnesemia Resolved   Transient ST depression on telemetry ECG unremarkable.  Asymptomatic. - Continue telemetry as patient is on IV Haldol       Disposition: Status is: Inpatient  Remains inpatient appropriate because:IV treatments appropriate due to intensity of illness or inability to take PO  Dispo: The patient is from: Home              Anticipated d/c is to: SNF              Patient currently is not medically stable to d/c.   Difficult to place patient No   Level of care: Med-Surg     Patient was admitted for refusal to eat.  This is a behavioral response in the setting of IDD.  A primary treatment will be placement, and so we will see it.    Her oral intake aversion may be prolonged, and so we will place PEG.  Ethics support our efforts, as above, and per their separte documentation.               MDM: The below labs and imaging reports  were reviewed and summarized above.  Medication management as above.    DVT prophylaxis: enoxaparin (LOVENOX) injection 40 mg Start: 01/24/21 1000  Code Status: FULL Family Communication:             Subjective: She has no complaints.  Has had no fever, vomiting, agitation, diarrhea, pain complaints.  She denies anything to me.         Objective: Vitals:   02/09/21 0344 02/09/21 0427 02/09/21 0822 02/09/21 1110  BP: 133/85  127/73 117/68  Pulse: (!) 108  (!) 109 (!) 104  Resp: 20  14 14   Temp: 98.5 F (36.9 C)  98.7 F (37.1 C) 98.6  F (37 C)  TempSrc: Oral  Oral Oral  SpO2: 97%  97% 98%  Weight:  73.2 kg      Intake/Output Summary (Last 24 hours) at 02/09/2021 1235 Last data filed at 02/08/2021 1800 Gross per 24 hour  Intake --  Output 400 ml  Net -400 ml   Filed Weights   02/07/21 0431 02/08/21 0409 02/09/21 0427  Weight: 73.4 kg 72.6 kg 73.2 kg    Examination: General appearance: Adult female, lying in bed, in restraints, appears to be in no distress HEENT: Anicteric, conjunctival pink, left eye with scant dried discharge, I do not appreciate any droop or swelling, no conjunctival injection.  No nasal deformity, discharge, epistaxis, lips normal.  Does not open mouth.   Skin: Skin warm and dry, no rashes of the face, neck, upper chest, or arms, refuses remainder of exam Cardiac: Refuses exam Respiratory: Refuses exam Abdomen: Refuses exam MSK: No deformities or effusions. Neuro: Awake and alert.  Makes eye contact, extraocular movements intact, face symmetric, moves upper extremities with normal strength in restraints, speech fluent.    Psych: Oriented to self.  Intellectual delay is obvious, attention normal.  Appears irritable.  Tells me her favorite television show is "Living Single", and that right now she is watching "02/11/21", otherwise, avoids further questions     Data Reviewed: I have personally reviewed following labs and imaging studies:  CBC: No results for input(s): WBC, NEUTROABS, HGB, HCT, MCV, PLT in the last 168 hours. Basic Metabolic Panel: Recent Labs  Lab 02/03/21 0304 02/09/21 0423  NA 136  --   K 4.5  --   CL 98  --   CO2 28  --   GLUCOSE 249*  --   BUN 10  --   CREATININE 0.72  --   CALCIUM 9.8  --   MG 2.1 2.3  PHOS 4.1 4.8*   GFR: Estimated Creatinine Clearance: 71.9 mL/min (by C-G formula based on SCr of 0.72 mg/dL). Liver Function Tests: No results for input(s): AST, ALT, ALKPHOS, BILITOT, PROT, ALBUMIN in the last 168 hours. No results for input(s): LIPASE,  AMYLASE in the last 168 hours. No results for input(s): AMMONIA in the last 168 hours. Coagulation Profile: No results for input(s): INR, PROTIME in the last 168 hours. Cardiac Enzymes: No results for input(s): CKTOTAL, CKMB, CKMBINDEX, TROPONINI in the last 168 hours. BNP (last 3 results) No results for input(s): PROBNP in the last 8760 hours. HbA1C: No results for input(s): HGBA1C in the last 72 hours. CBG: Recent Labs  Lab 02/08/21 1943 02/08/21 2353 02/09/21 0341 02/09/21 0820 02/09/21 1146  GLUCAP 126* 169* 191* 250* 252*   Lipid Profile: No results for input(s): CHOL, HDL, LDLCALC, TRIG, CHOLHDL, LDLDIRECT in the last 72 hours. Thyroid Function Tests: No results for input(s): TSH, T4TOTAL, FREET4,  T3FREE, THYROIDAB in the last 72 hours. Anemia Panel: No results for input(s): VITAMINB12, FOLATE, FERRITIN, TIBC, IRON, RETICCTPCT in the last 72 hours. Urine analysis:    Component Value Date/Time   COLORURINE YELLOW 01/24/2021 0907   APPEARANCEUR CLEAR 01/24/2021 0907   LABSPEC 1.021 01/24/2021 0907   PHURINE 5.0 01/24/2021 0907   GLUCOSEU 150 (A) 01/24/2021 0907   HGBUR NEGATIVE 01/24/2021 0907   BILIRUBINUR NEGATIVE 01/24/2021 0907   KETONESUR 20 (A) 01/24/2021 0907   PROTEINUR NEGATIVE 01/24/2021 0907   UROBILINOGEN 0.2 04/27/2010 1438   NITRITE NEGATIVE 01/24/2021 0907   LEUKOCYTESUR NEGATIVE 01/24/2021 0907   Sepsis Labs: @LABRCNTIP (procalcitonin:4,lacticacidven:4)  )No results found for this or any previous visit (from the past 240 hour(s)).       Radiology Studies: No results found.      Scheduled Meds:  benztropine mesylate  1 mg Intravenous Daily   bisacodyl  10 mg Rectal Q1500   enoxaparin (LOVENOX) injection  40 mg Subcutaneous Q24H   free water  100 mL Per Tube Q4H   haloperidol lactate  10 mg Intravenous QHS   haloperidol lactate  2.5 mg Intravenous QAC breakfast   insulin aspart  0-5 Units Subcutaneous QHS   insulin aspart  0-9 Units  Subcutaneous Q4H   insulin glargine-yfgn  40 Units Subcutaneous Daily   levETIRAcetam  1,000 mg Per Tube BID   mirtazapine  7.5 mg Per Tube QHS   multivitamin with minerals  1 tablet Per Tube Daily   polyethylene glycol  17 g Per Tube BID   sodium chloride flush  3 mL Intravenous Once   valACYclovir  500 mg Per Tube QHS   Continuous Infusions:  feeding supplement (GLUCERNA 1.2 CAL) 1,000 mL (02/08/21 1756)     LOS: 15 days    Time spent: 35 minutes    02/10/21, MD [facility] 02/09/2021, 12:35 PM     Please page though AMION or Epic secure chat:  For 02/11/2021, [employer]

## 2021-02-09 NOTE — Consult Note (Signed)
Chief Complaint: Patient was seen in consultation today for development disorder, poor PO intake  Referring Physician(s): Dr. Worthy Flank  Supervising Physician: Irish Lack  Patient Status: [facility] - In-pt  History of Present Illness: [name] is a 55 y.o. female with past medical history of HTN, IDD with recent refusal to eat.  Patient was placed on Haldol and TF via Cortrak for nutrition and meds.  She has tolerated feeds well, however has required restraints in order to keep her tubes and lines in place.  IR now consulted for possible percutaneous gastrostomy tube placement for the presumed prolonged need of tube feeding/nutrition.  Ethics committee has evaluated the case and has no ethical objection to placing gastrostomy.   Case reviewed and approved by Dr. Fredia Sorrow.   Past Medical History:  Diagnosis Date   Diabetes mellitus without complication (HCC)    Hypertension    Immune deficiency disorder (HCC)    Mild mental retardation     History reviewed. No pertinent surgical history.  Allergies: Penicillins, Sulfa antibiotics, and Tomato  Medications: Prior to Admission medications   Medication Sig Start Date End Date Taking? Authorizing Provider  furosemide (LASIX) 20 MG tablet Take 20 mg by mouth daily.   Yes [provider]  glimepiride (AMARYL) 2 MG tablet Take 2 mg by mouth daily before breakfast.   Yes [provider]  insulin aspart (NOVOLOG) 100 UNIT/ML injection Inject 1-7 Units into the skin 3 (three) times daily as needed for high blood sugar (Per sliding scale: 120-150=1 unit, 150-200=2 units, 201-250=4 units, 251-300=6 units).   Yes [provider]  insulin glargine (LANTUS) 100 unit/mL SOPN Inject 40 Units into the skin at bedtime.   Yes [provider]  loratadine (CLARITIN) 10 MG tablet Take 10 mg by mouth daily.   Yes [provider]  metFORMIN (GLUCOPHAGE) 500 MG tablet Take 500 mg by mouth 2  (two) times daily with a meal.   Yes [provider]  potassium chloride (KLOR-CON) 10 MEQ tablet Take 10 mEq by mouth daily.   Yes [provider]  risperiDONE (RISPERDAL) 2 MG tablet Take 1 tablet (2 mg total) by mouth at bedtime. 11/03/20 01/24/21 Yes Jerald Kief, MD  traZODone (DESYREL) 100 MG tablet Take 1 tablet (100 mg total) by mouth at bedtime as needed for sleep. 11/03/20 01/24/21 Yes Jerald Kief, MD  valACYclovir (VALTREX) 500 MG tablet Take 500 mg by mouth at bedtime.   Yes [provider]  levETIRAcetam (KEPPRA) 1000 MG tablet Take 1 tablet (1,000 mg total) by mouth 2 (two) times daily. 11/03/20 12/03/20  Jerald Kief, MD  simvastatin (ZOCOR) 20 MG tablet Take 1 tablet (20 mg total) by mouth at bedtime. Patient not taking: Reported on 01/24/2021 11/03/20 01/24/21  Jerald Kief, MD     Family History  Problem Relation Age of Onset   Hypertension Other     Social History   Socioeconomic History   Marital status: Single    Spouse name: Not on file   Number of children: Not on file   Years of education: Not on file   Highest education level: Not on file  Occupational History   Not on file  Tobacco Use   Smoking status: Never   Smokeless tobacco: Never  Substance and Sexual Activity   Alcohol use: No   Drug use: Never   Sexual activity: Never  Other Topics Concern   Not on file  Social History Narrative  Not on file   Social Determinants of Health   Financial Resource Strain: Not on file  Food Insecurity: Not on file  Transportation Needs: Not on file  Physical Activity: Not on file  Stress: Not on file  Social Connections: Not on file     Review of Systems: A 12 point ROS discussed and pertinent positives are indicated in the HPI above.  All other systems are negative.  Review of Systems  Unable to perform ROS: Mental status change   Vital Signs: BP 128/68 (BP Location: Right Arm)   Pulse 100   Temp 99.6 F (37.6 C) (Oral)    Resp 12   Wt 161 lb 6 oz (73.2 kg)   SpO2 99%   BMI 30.49 kg/m   Physical Exam Vitals and nursing note reviewed.  Constitutional:      General: She is not in acute distress.    Appearance: She is well-developed. She is not ill-appearing.  Cardiovascular:     Rate and Rhythm: Normal rate and regular rhythm.  Pulmonary:     Effort: Pulmonary effort is normal.     Breath sounds: Normal breath sounds.  Skin:    General: Skin is warm and dry.  Neurological:     General: No focal deficit present.     Mental Status: She is alert and oriented to person, place, and time.  Psychiatric:        Mood and Affect: Mood normal.        Behavior: Behavior normal.     MD Evaluation Airway: WNL Heart: WNL Abdomen: WNL Chest/ Lungs: WNL ASA  Classification: 3 Mallampati/Airway Score: Two   Imaging: DG Abdomen 1 View  Result Date: 01/24/2021 CLINICAL DATA:  Constipation EXAM: ABDOMEN - 1 VIEW COMPARISON:  None. FINDINGS: Moderate stool burden within the colon. There is a non obstructive bowel gas pattern. No supine evidence of free air. No organomegaly or suspicious calcification. No acute bony abnormality. IMPRESSION: Moderate stool burden.  No acute findings. Electronically Signed   By: Charlett Nose M.D.   On: 01/24/2021 00:46   CT HEAD WO CONTRAST  Result Date: 01/23/2021 CLINICAL DATA:  Neuro deficit EXAM: CT HEAD WITHOUT CONTRAST TECHNIQUE: Contiguous axial images were obtained from the base of the skull through the vertex without intravenous contrast. COMPARISON:  10/30/2020 FINDINGS: Brain: No evidence of acute infarction, hemorrhage, hydrocephalus, extra-axial collection or mass lesion/mass effect. Vascular: No hyperdense vessel or unexpected calcification. Skull: Normal. Negative for fracture or focal lesion. Sinuses/Orbits: Mucosal thickening, retention cyst, or polyp in the RIGHT maxillary sinus is stable. Other: None. IMPRESSION: No evidence for acute intracranial abnormality.  Electronically Signed   By: Norva Pavlov M.D.   On: 01/23/2021 20:10   MR BRAIN WO CONTRAST  Result Date: 01/24/2021 CLINICAL DATA:  Delirium, 55 year old female with history of intellectual disability, bipolar disorder, seizure disorder EXAM: MRI HEAD WITHOUT CONTRAST TECHNIQUE: Multiplanar, multiecho pulse sequences of the brain and surrounding structures were obtained without intravenous contrast. COMPARISON:  CT head 01/23/2021, MR head 10/31/2020 FINDINGS: Brain: There is no acute intracranial hemorrhage, extra-axial fluid collection, or infarct. The ventricles are not enlarged. There is no midline shift. No mass lesion is identified Vascular: Normal flow voids. Skull and upper cervical spine: Normal marrow signal. Sinuses/Orbits: There is a small right maxillary sinus mucous retention cyst. The globes and orbits are unremarkable. Other: None. IMPRESSION: No acute intracranial pathology or epileptogenic focus identified. Electronically Signed   By: Lesia Hausen MD   On:  01/24/2021 08:30   DG Chest Port 1 View  Result Date: 01/24/2021 CLINICAL DATA:  Constipation EXAM: PORTABLE CHEST 1 VIEW COMPARISON:  10/31/2020 FINDINGS: The heart size and mediastinal contours are within normal limits. Both lungs are clear. The visualized skeletal structures are unremarkable. IMPRESSION: No active disease. Electronically Signed   By: Charlett NoseKevin  Dover M.D.   On: 01/24/2021 00:45   DG Abd Portable 1V  Result Date: 01/31/2021 CLINICAL DATA:  Evaluate feeding tube placement EXAM: PORTABLE ABDOMEN - 1 VIEW COMPARISON:  01/24/2021 FINDINGS: Interval placement of weighted feeding tube. The tip is well below the GE junction and projects over the expected location of the antro pyloric junction. Bowel gas pattern is nonobstructive. IMPRESSION: Feeding tube tip projects over the antro pyloric junction. Electronically Signed   By: Signa Kellaylor  Stroud M.D.   On: 01/31/2021 15:19   EEG adult  Result Date: 01/24/2021 Charlsie QuestYadav, Priyanka  O, MD     01/24/2021 11:16 AM Patient Name: [name] MRN: [id number] Epilepsy Attending: Charlsie QuestPriyanka O Yadav Referring Physician/Provider: Dr Eligha Bridegroomathore, Vasundhara Date: 01/24/2021 Duration: 23.56 mins Patient history: 55 year old female with altered mental status.  EEG to evaluate for seizures. Level of alertness: Awake AEDs during EEG study: None Technical aspects: This EEG study was done with scalp electrodes positioned according to the 10-20 International system of electrode placement. Electrical activity was acquired at a sampling rate of 500Hz  and reviewed with a high frequency filter of 70Hz  and a low frequency filter of 1Hz . EEG data were recorded continuously and digitally stored. Description: The posterior dominant rhythm consists of 9-10 Hz activity of moderate voltage (25-35 uV) seen predominantly in posterior head regions, symmetric and reactive to eye opening and eye closing. Hyperventilation and photic stimulation were not performed.   IMPRESSION: This study is within normal limits. No seizures or epileptiform discharges were seen throughout the recording. Priyanka O Yadav    Labs:  CBC: Recent Labs    01/23/21 1905 01/23/21 2004 01/25/21 0438 01/26/21 0318 01/27/21 0151  WBC 9.2  --  7.1 7.3 7.2  HGB 13.7 14.6 13.0 12.3 12.5  HCT 42.1 43.0 39.2 37.3 37.4  PLT 442*  --  370 360 338    COAGS: Recent Labs    10/28/20 2013 10/31/20 0725 11/01/20 0934 01/23/21 1905  INR 0.9 1.0 0.9 0.9  APTT 26  --   --  29    BMP: Recent Labs    01/29/21 0725 02/01/21 0015 02/02/21 0110 02/03/21 0304  NA 137 140 137 136  K 3.8 3.4* 4.2 4.5  CL 105 104 104 98  CO2 23 26 25 28   GLUCOSE 137* 206* 310* 249*  BUN 5* <5* 5* 10  CALCIUM 9.2 9.8 9.6 9.8  CREATININE 0.80 0.67 0.67 0.72  GFRNONAA >60 >60 >60 >60    LIVER FUNCTION TESTS: Recent Labs    11/01/20 0207 11/02/20 0350 11/03/20 0155 01/23/21 1905  BILITOT 1.5* 0.8 0.3 0.9  AST 55* 46* 39 39  ALT 45* 46* 40 28  ALKPHOS  58 60 60 75  PROT 6.2* 6.4* 6.3* 8.1  ALBUMIN 3.3* 3.1* 3.1* 3.9    TUMOR MARKERS: No results for input(s): AFPTM, CEA, CA199, CHROMGRNA in the last 8760 hours.  Assessment and Plan: Intellectual disability, poor PO intake Patient refusing to eat PO.  Currently tolerating NG tube feeds, although requiring restraints to maintain lines and tubes.  Psych and ethics committee involved in care- no current ethical objections to proceeding with tube placement.  Discussed with Dr. Alfonse Flavors, hopefully with increased monitoring at SNF, patient will be able to leave gastrostomy tube in place.  Case reviewed by Dr. Fredia Sorrow who approves patient for placement.  Discussed at length with sister, [name].  She is acutely aware of patient's current mental status and limitations.  She is concerned for G-tube removal leading to potential complications if tract not mature.  Discussed ways we can try and prevent this knowing that there is nothing that will 100% prevent the patient from removing the tube in her current state.  Ultimately the goal would be for her to resume PO intake with ongoing Haldol use, however she has been on this medication for several days without improvement.  Sister is requesting we move forward with tube placement.  She would like to speak to the patient first and will not be able to come to hospital until after work tomorrow evening.   Will potentially work towards tube placement as early as Friday based on IR schedule.  Sister aware.   Risks and benefits image guided gastrostomy tube placement was discussed with the patient including, but not limited to the need for a barium enema during the procedure, bleeding, infection, peritonitis and/or damage to adjacent structures.  All of the patient's questions were answered, patient is agreeable to proceed.  Consent signed and in chart.  Thank you for this interesting consult.  I greatly enjoyed meeting [name] and look forward  to participating in their care.  A copy of this report was sent to the requesting provider on this date.  Electronically Signed: Hoyt Koch, PA 02/09/2021, 4:00 PM   I spent a total of 20 Minutes    in face to face in clinical consultation, greater than 50% of which was counseling/coordinating care for intellectual disability, poor PO intake.

## 2021-02-09 NOTE — Consult Note (Addendum)
Psychiatry C/L Note: Plan of Care  Spoke to Johnson [phone]):  [name] believes that it is best if [name] is sent to a nursing facility. She has to work, so she is unable to care for her full-time.   Additionally, [name] doesn't want [name]'s friends who visit to know the extent of what is going on with her. She asks that no information be given to them about PEG tube nor disposition, that Kaitlyne's care only be discussed with her.   SW and primary team informed of [name]'s decision. SW to kindly assist with placement; primary team to assist with Ethics consult for suitability of PEG placement; psychiatry to consult IR (per GI's advising) for PEG placement.   Patient seen today, and no changes. Still refusing all PO intake.   Lamar Sprinkles, MD PGY-1 02/09/2021 [facility] Department of Psychiatry

## 2021-02-09 NOTE — TOC Initial Note (Signed)
Transition of Care [facility]) - Initial/Assessment Note    Patient Details  Name: [name] MRN: [id number] Date of Birth: Feb 14, 1966  Transition of Care [facility]) CM/SW Contact:    Geralynn Ochs, LCSW Phone Number: 02/09/2021, 11:30 AM  Clinical Narrative:         CSW met with patient's sister, psychiatry MD and attending MD for goals of care meeting to discuss possibility of PEG placement and disposition options available for patient at discharge. CSW answered questions and provided information as appropriate, and will continue to follow for support as needed.             Barriers to Discharge: Continued Medical Work up, Inadequate or no insurance, Facility will not accept until restraint criteria met   Patient Goals and CMS Choice Patient states their goals for this hospitalization and ongoing recovery are:: patient unable to participate in goal setting, not fully oriented CMS Medicare.gov Compare Post Acute Care list provided to:: Patient Represenative (must comment) Choice offered to / list presented to : Sibling  Expected Discharge Plan and Services       Post Acute Care Choice: NA Living arrangements for the past 2 months: Single Family Home                                      Prior Living Arrangements/Services Living arrangements for the past 2 months: Single Family Home Lives with:: Siblings Patient language and need for interpreter reviewed:: No Do you feel safe going back to the place where you live?: Yes      Need for Family Participation in Patient Care: Yes (Comment) Care giver support system in place?: No (comment)   Criminal Activity/Legal Involvement Pertinent to Current Situation/Hospitalization: No - Comment as needed  Activities of Daily Living      Permission Sought/Granted Permission sought to share information with : Family Supports Permission granted to share information with : Yes, Verbal Permission Granted  Share Information with  NAME: [name]     Permission granted to share info w Relationship: Sister     Emotional Assessment   Attitude/Demeanor/Rapport: Unable to Assess Affect (typically observed): Unable to Assess Orientation: : Oriented to Self Alcohol / Substance Use: Not Applicable Psych Involvement: Yes (comment)  Admission diagnosis:  Hypoglycemia [E16.2] Constipation, unspecified constipation type [K59.00] Altered mental status, unspecified altered mental status type [R41.82] AMS (altered mental status) [R41.82] Patient Active Problem List   Diagnosis Date Noted   Poor appetite 01/26/2021   AMS (altered mental status) 01/25/2021   Constipation 01/24/2021   Bipolar disorder ([place]) 01/24/2021   Diabetes ([place]) 01/24/2021   Observed seizure-like activity ([place]) 10/31/2020   Seizure ([place]) 10/31/2020   Hypoglycemia 10/31/2020   Elevated LFTs 10/31/2020   Acute encephalopathy 10/30/2020   Change in mental status 10/29/2020   Intellectual disability    Confusion    PCP:  Iona Beard, MD Pharmacy:   [place], [place] - [facility] [address] [place] [address] Phone: [phone] Fax: [phone]  [facility] [place] [address] Phone: [phone] Fax: [phone]     Social Determinants of Health (SDOH) Interventions    Readmission Risk Interventions No flowsheet data found.

## 2021-02-09 NOTE — Ethics Note (Signed)
Ethics Consult Note  Initial contact w/ medical team 02/09/21, which was on patient's hospital day 15   Source of Consult: Dr Maryfrances Bunnell Current attending physician/service: Alberteen Sam, *  Reason(s) for consult and ethical question(s): Appropriate medical decisions being made for patient who cannot give informed consent    Information-gathering: Discussion with source of consult Chart review 02/09/21   Narrative:  Medical facts: patient with underlying developmental disorder was hospitalized for altered mental status, AMS determined to be psychiatric rather than organic/medical. Patient is at risk for malnourishment because her psychiatric condition has caused her to have delusions and she is refusing to eat. Has NG tube for nutrition at this point, this has been stable. Patient's Personal/Social Facts: Patient was stable in group home, awhile ago moved from there to sister's which was out of her usual routine and presumably caused significant increased stress for her which triggered AMS/delusions.   With regard to the applicable underlying ethical principles, the standards of ethical care and relevant resources were discussed directly with the attending physician.  Ethics committee strives to ensure that all necessary and appropriate steps are taken, such that all decisions made for this patient are ethically justifiable. Ethics offers the following recommendations:     Recommendations:  1) No ethical objection to placing PEG for nutrition maintenance, as benefits seem to be clear and the risk of this procedure is minimal. Patietn may not be able to CONSENT but hopefully will ASSENT. OK to proceed provided that the HCPOA has been educated on benefits (continued nutrition and hopefully this will allow recovery to baseline at which patient can feed herself again), versus risks (presence of PEG may be agitating and require chemical/physical restraints, general risks of procedure,  etc).          Thank you for this consult. Ethics will continue to follow this case.   Dr/ Maryfrances Bunnell has my personal cell phone number and has my permission to share this number at their discretion.  Secure message on Epic is also welcome but may not receive an immediate response.  Please reference AMION for on-call committee member if needed.    Sunnie Nielsen [facility] Ethics Committee

## 2021-02-09 NOTE — Plan of Care (Signed)
  Problem: Education: Goal: Knowledge of General Education information will improve Description: Including pain rating scale, medication(s)/side effects and non-pharmacologic comfort measures Outcome: Progressing   Problem: Health Behavior/Discharge Planning: Goal: Ability to manage health-related needs will improve Outcome: Progressing   Problem: Clinical Measurements: Goal: Ability to maintain clinical measurements within normal limits will improve Outcome: Progressing Goal: Will remain free from infection Outcome: Progressing Goal: Diagnostic test results will improve Outcome: Progressing Goal: Respiratory complications will improve Outcome: Progressing   Problem: Coping: Goal: Level of anxiety will decrease Outcome: Progressing   Problem: Elimination: Goal: Will not experience complications related to bowel motility Outcome: Progressing Goal: Will not experience complications related to urinary retention Outcome: Progressing   Problem: Safety: Goal: Non-violent Restraint(s) Outcome: Progressing   Problem: Education: Goal: Expressions of having a comfortable level of knowledge regarding the disease process will increase Outcome: Progressing   Problem: Coping: Goal: Ability to adjust to condition or change in health will improve Outcome: Progressing Goal: Ability to identify appropriate support needs will improve Outcome: Progressing   Problem: Health Behavior/Discharge Planning: Goal: Compliance with prescribed medication regimen will improve Outcome: Progressing   Problem: Medication: Goal: Risk for medication side effects will decrease Outcome: Progressing

## 2021-02-10 DIAGNOSIS — R4182 Altered mental status, unspecified: Secondary | ICD-10-CM | POA: Diagnosis not present

## 2021-02-10 DIAGNOSIS — G9340 Encephalopathy, unspecified: Secondary | ICD-10-CM | POA: Diagnosis not present

## 2021-02-10 DIAGNOSIS — R630 Anorexia: Secondary | ICD-10-CM | POA: Diagnosis not present

## 2021-02-10 DIAGNOSIS — E876 Hypokalemia: Secondary | ICD-10-CM | POA: Diagnosis not present

## 2021-02-10 LAB — GLUCOSE, CAPILLARY
Glucose-Capillary: 177 mg/dL — ABNORMAL HIGH (ref 70–99)
Glucose-Capillary: 179 mg/dL — ABNORMAL HIGH (ref 70–99)
Glucose-Capillary: 185 mg/dL — ABNORMAL HIGH (ref 70–99)
Glucose-Capillary: 191 mg/dL — ABNORMAL HIGH (ref 70–99)
Glucose-Capillary: 193 mg/dL — ABNORMAL HIGH (ref 70–99)
Glucose-Capillary: 209 mg/dL — ABNORMAL HIGH (ref 70–99)
Glucose-Capillary: 235 mg/dL — ABNORMAL HIGH (ref 70–99)
Glucose-Capillary: 75 mg/dL (ref 70–99)

## 2021-02-10 MED ORDER — INSULIN GLARGINE-YFGN 100 UNIT/ML ~~LOC~~ SOLN
26.0000 [IU] | Freq: Two times a day (BID) | SUBCUTANEOUS | Status: DC
  Administered 2021-02-10 – 2021-02-11 (×2): 26 [IU] via SUBCUTANEOUS
  Filled 2021-02-10 (×4): qty 0.26

## 2021-02-10 MED ORDER — HALOPERIDOL 5 MG PO TABS
7.5000 mg | ORAL_TABLET | Freq: Every day | ORAL | Status: DC
  Administered 2021-02-10: 7.5 mg
  Filled 2021-02-10: qty 1

## 2021-02-10 NOTE — Progress Notes (Signed)
Pt noted to have edema in bilat hands. Warm pads applied to bilat hands. Pt tolerating well. This RN will conti monitor.

## 2021-02-10 NOTE — Progress Notes (Signed)
[facility] PROGRESS NOTE    [name]  GHW:[id number] DOB: 06-Jul-1965 DOA: 01/23/2021 PCP: Mirna Mires, MD      Brief Narrative:  [name] is a 55 y.o. F with DM, HTN, developmental delay who presented with decreased oral intake and "saying bizarre things".  In the ER, CT head unremarkable.  She was hypoglycemic and otherwise had normal chemistries and hemogram.  Admitted for evaluation of erratic behavior.          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  Brain imaging unremarkable.  EEG unremarkable.  UA negative, urine drug screen negative, procalcitonin low.  B12, ammonia, TSH normal. Acute metabolic encephalopathy ruled out.  Psychiatry were consulted who noted a temporal relationship between patient moving from structured group home setting to live with her sister, and subsequent presentation of her fixed delusions about eating and then cessation of oral intake.  They have recommended Haldol, benztropine, mirtazapine for medical therapy.  They have also recommended re-instituting a structured and familiar environment for [name], either in a new group home, in an ALF, or with sister.    As the patient has continued to refuse oral intake, they have recommended PEG tube.   - Consult psychiatry, appreciate cares - Continue mirtazapine, Haldol, benztropine  - Consult to IR for PEG, appreciate cares, PEG may need to be placed under GA - Consult TOC for placement    Inadequate oral intake due to fixed delusion See progress note and Ethics note from 8/24    Seizure disorder Has seizure disorder, no seizures here - Continue keppra    Diabetes Glucoses still high.  On Lantus 40 nightly at home.   - Continue glargine, increase dose again - Continuie SS corrections  Obesity BMI >30  Hypokalemia Hypomagnesemia Resolved           Disposition: Status is: Inpatient  Remains inpatient appropriate because:IV treatments appropriate due to intensity of illness or inability to take PO  Dispo: The patient is from: Home              Anticipated d/c is to: SNF              Patient currently is not medically stable to d/c.   Difficult to place patient No   Level of care: Med-Surg     Patient was admitted for refusal to eat.  This is a behavioral response in the setting of IDD.  A primary treatment to resolve this will be placmeent in a structured environment, and so we seek placement at this time, setting TBD by TOC.     Her oral intake aversion may be prolonged, and so we will place PEG.  Ethics support our efforts, as documented on 8/24               MDM: The below labs and imaging reports were reviewed and summarized above.  Medication management as above.    DVT prophylaxis: enoxaparin (LOVENOX) injection 40 mg Start: 01/24/21 1000  Code Status: FULL Family Communication:             Subjective: She has no complaints.  Has had no fever, vomiting, agitation, diarrhea, pain complaints.  She denies anything to me.         Objective: Vitals:   02/10/21 0805 02/10/21 1103 02/10/21 1513 02/10/21 1530  BP: (!) 163/74 (!) 146/72 (!) 149/48   Pulse: (!) 110 Marland Kitchen)  109 (!) 107   Resp: 16 14 14    Temp: 97.6 F (36.4 C) 98.3 F (36.8 C)  98.8 F (37.1 C)  TempSrc: Axillary Oral  Axillary  SpO2: 97% 100% 99%   Weight:        Intake/Output Summary (Last 24 hours) at 02/10/2021 1612 Last data filed at 02/10/2021 02/12/2021 Gross per 24 hour  Intake 5773 ml  Output 600 ml  Net 5173 ml   Filed Weights   02/08/21 0409 02/09/21 0427 02/10/21 0433  Weight: 72.6 kg 73.2 kg 71.9 kg    Examination: General appearance: Adult female, lying in bed, in restraints, appears to be in no distress HEENT: Anicteric, conjunctival pink, left eye with scant dried discharge, I do not appreciate  any droop or swelling, no conjunctival injection.  No nasal deformity, discharge, epistaxis, lips normal.  Does not open mouth.   Skin: Skin warm and dry, no rashes of the face, neck, upper chest, or arms, refuses remainder of exam Cardiac: Refuses exam Respiratory: Refuses exam Abdomen: Refuses exam MSK: No deformities or effusions. Neuro: Awake and alert.  Makes eye contact, extraocular movements intact, face symmetric, moves upper extremities with normal strength in restraints, speech fluent.    Psych: Oriented to self only, refuses other questions.       Data Reviewed: I have personally reviewed following labs and imaging studies:  CBC: No results for input(s): WBC, NEUTROABS, HGB, HCT, MCV, PLT in the last 168 hours. Basic Metabolic Panel: Recent Labs  Lab 02/09/21 0423  MG 2.3  PHOS 4.8*   GFR: Estimated Creatinine Clearance: 71.2 mL/min (by C-G formula based on SCr of 0.72 mg/dL). Liver Function Tests: No results for input(s): AST, ALT, ALKPHOS, BILITOT, PROT, ALBUMIN in the last 168 hours. No results for input(s): LIPASE, AMYLASE in the last 168 hours. No results for input(s): AMMONIA in the last 168 hours. Coagulation Profile: No results for input(s): INR, PROTIME in the last 168 hours. Cardiac Enzymes: No results for input(s): CKTOTAL, CKMB, CKMBINDEX, TROPONINI in the last 168 hours. BNP (last 3 results) No results for input(s): PROBNP in the last 8760 hours. HbA1C: No results for input(s): HGBA1C in the last 72 hours. CBG: Recent Labs  Lab 02/09/21 2112 02/10/21 0027 02/10/21 0355 02/10/21 0758 02/10/21 1139  GLUCAP 197* 177* 179* 209* 235*   Lipid Profile: No results for input(s): CHOL, HDL, LDLCALC, TRIG, CHOLHDL, LDLDIRECT in the last 72 hours. Thyroid Function Tests: No results for input(s): TSH, T4TOTAL, FREET4, T3FREE, THYROIDAB in the last 72 hours. Anemia Panel: No results for input(s): VITAMINB12, FOLATE, FERRITIN, TIBC, IRON, RETICCTPCT in the  last 72 hours. Urine analysis:    Component Value Date/Time   COLORURINE YELLOW 01/24/2021 0907   APPEARANCEUR CLEAR 01/24/2021 0907   LABSPEC 1.021 01/24/2021 0907   PHURINE 5.0 01/24/2021 0907   GLUCOSEU 150 (A) 01/24/2021 0907   HGBUR NEGATIVE 01/24/2021 0907   BILIRUBINUR NEGATIVE 01/24/2021 0907   KETONESUR 20 (A) 01/24/2021 0907   PROTEINUR NEGATIVE 01/24/2021 0907   UROBILINOGEN 0.2 04/27/2010 1438   NITRITE NEGATIVE 01/24/2021 0907   LEUKOCYTESUR NEGATIVE 01/24/2021 0907   Sepsis Labs: @LABRCNTIP (procalcitonin:4,lacticacidven:4)  )No results found for this or any previous visit (from the past 240 hour(s)).       Radiology Studies: No results found.      Scheduled Meds:  benztropine  1 mg Per Tube Daily   bisacodyl  10 mg Rectal Q1500   enoxaparin (LOVENOX) injection  40 mg Subcutaneous Q24H  free water  100 mL Per Tube Q4H   haloperidol  10 mg Per Tube QHS   haloperidol  2.5 mg Per Tube Daily   insulin aspart  0-15 Units Subcutaneous Q4H   insulin glargine-yfgn  22 Units Subcutaneous BID   levETIRAcetam  1,000 mg Per Tube BID   mirtazapine  7.5 mg Per Tube QHS   multivitamin with minerals  1 tablet Per Tube Daily   polyethylene glycol  17 g Per Tube BID   sodium chloride flush  3 mL Intravenous Once   valACYclovir  500 mg Per Tube QHS   Continuous Infusions:  feeding supplement (GLUCERNA 1.2 CAL) 1,000 mL (02/10/21 1510)     LOS: 16 days    Time spent: 15 minutes    Alberteen Sam, MD [facility] 02/10/2021, 4:12 PM     Please page though AMION or Epic secure chat:  For [employer], Higher education careers adviser

## 2021-02-10 NOTE — Progress Notes (Signed)
Pt refuses labs

## 2021-02-10 NOTE — Consult Note (Signed)
Psychiatry C/L Progress Note  [name]  MRN:  [id number] Reason for Consult:  Flat affect, refusal to eat, and medication management Referring Physician:  Stephania Fragmin, MD Method of visit?: Face to Face    Principal Problem: Acute encephalopathy Diagnosis:  Principal Problem:   Acute encephalopathy Active Problems:   Hypoglycemia   Constipation   Bipolar disorder (HCC)   Diabetes (HCC)   AMS (altered mental status)   Poor appetite   Intellectual Disability  Subjective: [name] is a 55 year old female admitted for evaluation of acute mental status change and consulted to the psychiatry service for potential medication management in light of flat affect and refusal to eat. This morning, she was moderately drowsy but arousable and answered questions. She is A&O to person, time, and place, and she answers questions appropriately. She was sitting up in bed in soft bilateral wrist restraints with the Cortrak placed in her nose. Her mood is "terrible" because "I'm in a baby bed." She then goes on to acknowledge that her hands are swollen; she denies being in any pain. We discussed placement of the G-tube since she continues to refuse to eat, and she nodded in understanding that that was the next step (although her sister is guardian and has already agreed per IR and primary teams).   Treatment Plan Summary: Although she continues to refuse to eat, [name] is mostly appropriate in her responses today. She appears to be more drowsy on the current dosage of Haldol. Her symptoms are likely due to her intellectual disability, in which she is able to perform most tasks without or with minimal assistance and has some maladaptive behaviors.   IDD symptomology: Food refusal/ psychogenic loss of appetite  -Decrease Haldol to 2.5 mg per tube qAM and 7.5 mg qHS per tube d/t increased drowsiness. Qtc 445.  -Continue Cogentin 1 mg per tube daily. -Continue Remeron 7.5 mg qHS for appetite  stimulation -Tanya confirmed that she would like for [name] to have SNF placement for management of her G-tube. SW kindly assisting with placement.   Disposition: SNF placement upon discharge. Psychiatry will continue to follow during this admission.   Lamar Sprinkles, MD PGY-1 02/10/2021 [facility] Department of Psychiatry

## 2021-02-10 NOTE — Progress Notes (Signed)
Dr. Antionette Char informed about pt's mild swelling in bilat hands. Pt at times pulls her body forward, which causes the restraints to cause swelling.

## 2021-02-10 NOTE — Progress Notes (Signed)
Nutrition Follow-up  DOCUMENTATION CODES:  Not applicable  INTERVENTION:  Continue TF via Cortrak: -Glucerna 1.2 @ 50m/hr (15646md)  -10056mree water Q4H  Provides 1872 kcals, 93 grams protein, 1255m55mee water (1855ml14mal free water)  TF regimen may be continued once PEG is placed and ready for use.   NUTRITION DIAGNOSIS:  Inadequate oral intake related to lethargy/confusion as evidenced by meal completion < 25%, per patient/family report. --ongoing  GOAL:  Patient will meet greater than or equal to 90% of their needs -- met with TF  MONITOR:  TF tolerance, Weight trends, Labs, I & O's  REASON FOR ASSESSMENT:  Consult Enteral/tube feeding initiation and management  ASSESSMENT:  Pt with PMH significant of intellectual disability, bipolar disorder, seizure disorder, type 2 DM, HTN, HLD, immunodeficiency disorder presented to the ED for change in mental status as reported by patient's sister for the past couple of days at home. CT head, MRI brain, EEG, and other neurologic work-up have been negative. Abdominal x-ray showed moderate stool burden. Pt does have h/o constipation and was recommended by GI to take Linzess and Dulcolax, though pt has been noncompliant. Pt has outpatient colonoscopy scheduled.  8/15 Cortrak placed (gastric tip)  Discussed pt with RN who reports no issues with pt tolerating TF. Also reports pt continues to refuse PO intake. Plans for PEG placement. Current TF regimen may be continued once PEG placed and ready for use.   Current TF: Glucerna 1.2 @ 65ml/60mith 100ml f43mwater Q4H  Given consistently elevated CBGs and no tolerance issues with current TF regimen, will transition pt to Glucerna TF.   Medications: dulcolax, SSI, Semglee, remeron, mvi with minerals, miralax Labs (last updated 8/24): PO4 4.8 (H) CBGs 179-209-235   UOP: 1L x24 hours I/O: +13.1L since admit  Admission weight: 71.6 kg Current weight: 71.9 kg  Mild pitting edema to  LLE noted per RN edema assessment  Diet Order:   Diet Order             Diet regular Room service appropriate? Yes; Fluid consistency: Thin  Diet effective now                  EDUCATION NEEDS:  Not appropriate for education at this time  Skin:  Skin Assessment: Reviewed RN Assessment  Last BM:  8/25 type 6  Height:  Ht Readings from Last 1 Encounters:  10/30/20 '5\' 1"'  (1.549 m)   Weight:  Wt Readings from Last 1 Encounters:  02/10/21 71.9 kg   BMI:  Body mass index is 29.95 kg/m.  Estimated Nutritional Needs:  Kcal:  1800-2000 Protein:  90-100 grams Fluid:  >1.8L/d    Katharyn Schauer Larkin InaD, LDN (she/her/hers) RD pager number and weekend/on-call pager number located in [email or web address]

## 2021-02-11 DIAGNOSIS — R4182 Altered mental status, unspecified: Secondary | ICD-10-CM | POA: Diagnosis not present

## 2021-02-11 DIAGNOSIS — F819 Developmental disorder of scholastic skills, unspecified: Secondary | ICD-10-CM | POA: Diagnosis not present

## 2021-02-11 DIAGNOSIS — F5089 Other specified eating disorder: Secondary | ICD-10-CM | POA: Diagnosis not present

## 2021-02-11 DIAGNOSIS — E876 Hypokalemia: Secondary | ICD-10-CM | POA: Diagnosis not present

## 2021-02-11 LAB — GLUCOSE, CAPILLARY
Glucose-Capillary: 205 mg/dL — ABNORMAL HIGH (ref 70–99)
Glucose-Capillary: 213 mg/dL — ABNORMAL HIGH (ref 70–99)
Glucose-Capillary: 133 mg/dL — ABNORMAL HIGH (ref 70–99)
Glucose-Capillary: 130 mg/dL — ABNORMAL HIGH (ref 70–99)
Glucose-Capillary: 141 mg/dL — ABNORMAL HIGH (ref 70–99)

## 2021-02-11 MED ORDER — HALOPERIDOL LACTATE 5 MG/ML IJ SOLN: 5.0000 mg | Freq: Once | INTRAMUSCULAR | Status: DC | PRN

## 2021-02-11 MED ORDER — LORAZEPAM 2 MG/ML IJ SOLN: 2.0000 mg | Freq: Once | INTRAMUSCULAR | Status: DC | PRN

## 2021-02-11 MED ORDER — HALOPERIDOL 0.5 MG PO TABS
2.5000 mg | ORAL_TABLET | Freq: Two times a day (BID) | ORAL | Status: DC
  Administered 2021-02-12 – 2021-02-13 (×3): 2.5 mg
  Filled 2021-02-11 (×3): qty 5

## 2021-02-11 MED ORDER — HALOPERIDOL 5 MG PO TABS
5.0000 mg | ORAL_TABLET | Freq: Every day | ORAL | Status: DC
  Administered 2021-02-11 – 2021-02-13 (×3): 5 mg
  Filled 2021-02-11 (×3): qty 1

## 2021-02-11 MED ORDER — INSULIN ASPART 100 UNIT/ML IJ SOLN
0.0000 [IU] | INTRAMUSCULAR | Status: DC
  Administered 2021-02-11 (×3): 3 [IU] via SUBCUTANEOUS
  Administered 2021-02-12: 7 [IU] via SUBCUTANEOUS
  Administered 2021-02-12: 3 [IU] via SUBCUTANEOUS
  Administered 2021-02-12: 7 [IU] via SUBCUTANEOUS
  Administered 2021-02-12 (×2): 3 [IU] via SUBCUTANEOUS
  Administered 2021-02-13 (×2): 4 [IU] via SUBCUTANEOUS
  Administered 2021-02-13: 7 [IU] via SUBCUTANEOUS
  Administered 2021-02-13: 4 [IU] via SUBCUTANEOUS
  Administered 2021-02-13: 3 [IU] via SUBCUTANEOUS
  Administered 2021-02-13: 4 [IU] via SUBCUTANEOUS
  Administered 2021-02-13: 3 [IU] via SUBCUTANEOUS

## 2021-02-11 MED ORDER — INSULIN GLARGINE-YFGN 100 UNIT/ML ~~LOC~~ SOLN
22.0000 [IU] | Freq: Two times a day (BID) | SUBCUTANEOUS | Status: DC
  Administered 2021-02-11 – 2021-02-13 (×5): 22 [IU] via SUBCUTANEOUS
  Filled 2021-02-11 (×7): qty 0.22

## 2021-02-11 NOTE — Progress Notes (Signed)
[facility] PROGRESS NOTE  [name] FUX:[id number] DOB: [birth year] DOA: 01/23/2021 PCP: Mirna Mires, MD  Status: Remains inpatient appropriate because:Altered mental status, Unsafe d/c plan, IV treatments appropriate due to intensity of illness or inability to take PO, and Inpatient level of care appropriate due to severity of illness  Dispo: The patient is from: Home              Anticipated d/c is to: SNF: [facility] that can accommodate both IDD needs and PEG tube              Patient currently is not medically stable to d/c.   Difficult to place patient Yes   Level of care: Med-Surg  Code Status: Full Family Communication:  DVT prophylaxis: Lovenox COVID vaccination status: Unknown   HPI: 55 y.o. F with DM, HTN, developmental delay who presented with decreased oral intake and "saying bizarre things".   In the ER, CT head unremarkable.  She was hypoglycemic and otherwise had normal chemistries and hemogram.  Admitted for evaluation of erratic behavior.  Subjective: Has just undergone PEG tube placement and remains somewhat sedated.  Objective: Vitals:   02/11/21 0006 02/11/21 0344  BP: 138/82 138/77  Pulse: (!) 110 94  Resp: 18 18  Temp: (!) 97.5 F (36.4 C) 97.6 F (36.4 C)  SpO2: 99% 100%    Intake/Output Summary (Last 24 hours) at 02/11/2021 0751 Last data filed at 02/11/2021 0731 Gross per 24 hour  Intake 2569 ml  Output 1300 ml  Net 1269 ml   Filed Weights   02/09/21 0427 02/10/21 0433 02/11/21 0409  Weight: 73.2 kg 71.9 kg 71.9 kg    Exam:  Constitutional: NAD, calm, comfortable Respiratory: clear to auscultation bilaterally, no wheezing, no crackles. Normal respiratory effort. RA Cardiovascular: Regular rate and rhythm, no murmurs / rubs / gallops. No extremity edema. 2+ pedal pulses. No carotid bruits.  Abdomen: Cortrak tube has been removed- PEG tube placed 8/29. LBM 8/28 Neurologic: CN 2-12 grossly intact. Sensation intact,  Strength 3/5 x all 4 extremities.  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  Brain imaging unremarkable.  EEG unremarkable.  UA negative, urine drug screen negative, procalcitonin low.  B12, ammonia, TSH normal. Acute metabolic encephalopathy ruled out. Psychiatry consulted and suspect nephric and change in environment has precipitated development of patient's withdrawn state Continue low-dose Haldol (2.5 mg with breakfast and lunch and 5 mg at hs), benztropine, mirtazapine We will also try to provide a more structured environment.  We will encourage her frequently to get out of bed to the chair and we will allow her to get up in a wheelchair and wheeled outside of the unit including outside with caregiver Because of continued issues with refusal of oral intake and after discussion with the family patient underwent PEG tube placement on 8/29 Orders in place for patient to be out of bed 3 times a day and definitely with meals.  Have also allowed for off unit privileges with sitter.  Would like to opt to minimally have patient placed in wheelchair and wheeled outside   Poor oral intake 2/2 fixed delusions/Malnutrition/Obesity PEG tube placed on 8/29 and per IR can begin tube feedings in a.m. 8/30 Ethics team consulted who saw no ethical barrier to placing PEG tube and initiating feedings noting PEG tube can be easily removed once patient  starts eating again. Nutrition Problem: Inadequate oral intake Etiology: lethargy/confusion Signs/Symptoms: meal completion < 25%, per patient/family report Interventions: Tube feeding   Seizure disorder Has seizure disorder, no seizures here Continue keppra  Physical deconditioning secondary to fixed delusion Although I was able to get the patient to weakly lift  both legs she has told providers that she is unable to walk secondary to not having any legs PT and OT evaluation-although patient is known to be developmentally delayed there may be some benefit to having SLP and OT perform cognitive evaluations   Diabetes melitis 2 uncontrolled on long-term insulin CBGs remain elevated Continue glargine 26 units BID Continue SSI correction  Hypokalemia Hypomagnesemia Resolved         Data Reviewed: Basic Metabolic Panel: Recent Labs  Lab 02/09/21 0423  MG 2.3  PHOS 4.8*   Liver Function Tests: No results for input(s): AST, ALT, ALKPHOS, BILITOT, PROT, ALBUMIN in the last 168 hours. No results for input(s): LIPASE, AMYLASE in the last 168 hours. No results for input(s): AMMONIA in the last 168 hours. CBC: No results for input(s): WBC, NEUTROABS, HGB, HCT, MCV, PLT in the last 168 hours. Cardiac Enzymes: No results for input(s): CKTOTAL, CKMB, CKMBINDEX, TROPONINI in the last 168 hours. BNP (last 3 results) Recent Labs    11/01/20 0405 11/02/20 0350 11/03/20 0155  BNP 15.0 8.6 6.0    ProBNP (last 3 results) No results for input(s): PROBNP in the last 8760 hours.  CBG: Recent Labs  Lab 02/10/21 1536 02/10/21 2028 02/10/21 2157 02/10/21 2354 02/11/21 0356  GLUCAP 75 193* 191* 185* 205*    No results found for this or any previous visit (from the past 240 hour(s)).   Studies: No results found.  Scheduled Meds:  benztropine  1 mg Per Tube Daily   bisacodyl  10 mg Rectal Q1500   enoxaparin (LOVENOX) injection  40 mg Subcutaneous Q24H   free water  100 mL Per Tube Q4H   haloperidol  2.5 mg Per Tube Daily   haloperidol  7.5 mg Per Tube QHS   insulin aspart  0-15 Units Subcutaneous Q4H   insulin glargine-yfgn  26 Units Subcutaneous BID   levETIRAcetam  1,000 mg Per Tube BID   mirtazapine  7.5 mg Per Tube QHS   multivitamin with minerals  1 tablet Per Tube Daily   polyethylene glycol  17 g Per Tube BID   sodium  chloride flush  3 mL Intravenous Once   valACYclovir  500 mg Per Tube QHS   Continuous Infusions:  feeding supplement (GLUCERNA 1.2 CAL) 1,000 mL (02/11/21 0732)    Principal Problem:   Acute encephalopathy Active Problems:   Hypoglycemia   Constipation   Bipolar disorder (HCC)   Diabetes (HCC)   AMS (altered mental status)   Poor appetite   Consultants: Neurology Psychiatry  Procedures: EEG Long-term EEG  Antibiotics: Valacyclovir 8/15 through 8/28   Time spent: 5 minutes    Junious Silk ANP  [facility] 7 am - 330 pm/M-F for direct patient care and secure chat Please refer to Amion for contact info 17  days

## 2021-02-11 NOTE — Plan of Care (Signed)
  Problem: Education: Goal: Knowledge of General Education information will improve Description: Including pain rating scale, medication(s)/side effects and non-pharmacologic comfort measures Outcome: Progressing   Problem: Health Behavior/Discharge Planning: Goal: Ability to manage health-related needs will improve Outcome: Progressing   Problem: Clinical Measurements: Goal: Ability to maintain clinical measurements within normal limits will improve Outcome: Progressing Goal: Will remain free from infection Outcome: Progressing Goal: Diagnostic test results will improve Outcome: Progressing   Problem: Safety: Goal: Non-violent Restraint(s) Outcome: Progressing   Problem: Education: Goal: Expressions of having a comfortable level of knowledge regarding the disease process will increase Outcome: Progressing   Problem: Coping: Goal: Ability to adjust to condition or change in health will improve Outcome: Progressing Goal: Ability to identify appropriate support needs will improve Outcome: Progressing   Problem: Health Behavior/Discharge Planning: Goal: Compliance with prescribed medication regimen will improve Outcome: Progressing   Problem: Medication: Goal: Risk for medication side effects will decrease Outcome: Progressing

## 2021-02-11 NOTE — Evaluation (Signed)
Occupational Therapy Evaluation Patient Details Name: [name] MRN: [id number] DOB: 1965-07-19 Today's Date: 02/11/2021    History of Present Illness 55 y.o. female with medical history significant of intellectual disability, bipolar disorder, seizure disorder, type II diabetes, hypertension, hyperlipidemia, immune deficiency disorder presenting to the ED for evaluation of altered mental status   Clinical Impression   Patient admitted for the diagnosis above.  Currently highly resistant to any attempted movement.  OT will continue efforts to assess the patient for possible goal setting and assist with goals of care.  SNF versus psych admission is being considered.  OT trial in the acute setting to maximize functional status.      Follow Up Recommendations  SNF    Equipment Recommendations  Wheelchair (measurements OT);Wheelchair cushion (measurements OT)    Recommendations for Other Services       Precautions / Restrictions Precautions Precautions: Fall Precaution Comments: Wrist restraints.  Cortrak. Restrictions Weight Bearing Restrictions: No Other Position/Activity Restrictions: Currently very resistant to movement.  Psych is following      Mobility Bed Mobility Overal bed mobility: Needs Assistance             General bed mobility comments: patient currently in B wrist restraints. Patient Response: Anxious;Restless  Transfers                 General transfer comment: Unable to assess    Balance                                           ADL either performed or assessed with clinical judgement   ADL                                         General ADL Comments: Unable to assess currenlty due to current cognitive dysfunction and resistance to moving.     Vision Patient Visual Report: No change from baseline       Perception     Praxis      Pertinent Vitals/Pain Pain Assessment: Faces Faces Pain  Scale: Hurts even more Pain Location: generalized with any attempted movement to arms or legs.  Patient is unable to describe. Pain Descriptors / Indicators: Grimacing;Guarding;Other (Comment) (Yelling out) Pain Intervention(s): Monitored during session     Hand Dominance     Extremity/Trunk Assessment Upper Extremity Assessment Upper Extremity Assessment: Difficult to assess due to impaired cognition   Lower Extremity Assessment Lower Extremity Assessment: Defer to PT evaluation       Communication Communication Communication: Other (comment) (Patient stating she cannot talk)   Cognition Arousal/Alertness: Awake/alert Behavior During Therapy: Restless;Anxious;Flat affect   Area of Impairment: Orientation;Attention;Following commands;Awareness                 Orientation Level: Disoriented to;Situation;Time Current Attention Level: Focused       Awareness: Intellectual                        Home Living Family/patient expects to be discharged to:: Other (Comment) (Possible psyche admit) Living Arrangements: Other relatives;Other (Comment) (sister) Available Help at Discharge: Family;Available 24 hours/day Type of Home: House Home Access: Level entry     Home Layout: One level     Bathroom Shower/Tub: Walk-in shower  Additional Comments: All information gathered from the chart.  Patient unable to give any details regarding home setup, or prior level of function.      Prior Functioning/Environment Level of Independence: Needs assistance        Comments: Chart suggests declines to mobility and self care.  Patient not eating.        OT Problem List: Decreased safety awareness;Decreased cognition;Pain      OT Treatment/Interventions:      OT Goals(Current goals can be found in the care plan section) Acute Rehab OT Goals Patient Stated Goal: None stated OT Goal Formulation: Patient unable to participate in goal setting Time  For Goal Achievement: 02/25/21 Potential to Achieve Goals: Good ADL Goals Pt Will Perform Grooming: with min guard assist;sitting Pt Will Perform Upper Body Bathing: with min assist;sitting Pt Will Perform Upper Body Dressing: with min assist;sitting Additional ADL Goal #1: Patient will be Min A of one for supine to sit to increase independence with upper body ADL  OT Frequency:     Barriers to D/C:  Current medical condition          Co-evaluation              AM-PAC OT "6 Clicks" Daily Activity     Outcome Measure Help from another person eating meals?: Total Help from another person taking care of personal grooming?: A Lot Help from another person toileting, which includes using toliet, bedpan, or urinal?: Total Help from another person bathing (including washing, rinsing, drying)?: Total Help from another person to put on and taking off regular upper body clothing?: A Lot Help from another person to put on and taking off regular lower body clothing?: Total 6 Click Score: 8   End of Session    Activity Tolerance: Patient limited by pain;Treatment limited secondary to agitation Patient left: in bed;with call bell/phone within reach;with bed alarm set;with restraints reapplied  OT Visit Diagnosis: Other symptoms and signs involving cognitive function;Pain Pain - part of body: Arm;Shoulder;Leg;Ankle and joints of foot;Knee                Time: 1419-1430 OT Time Calculation (min): 11 min Charges:  OT General Charges $OT Visit: 1 Visit OT Evaluation $OT Eval Moderate Complexity: 1 Mod  02/11/2021  RP, OTR/L  Acute Rehabilitation Services  Office:  [phone]   Suzanna Obey 02/11/2021, 2:43 PM

## 2021-02-11 NOTE — Consult Note (Addendum)
Psychiatry C/L Progress Note   [name]  MRN:  [id number] Reason for Consult:  Flat affect, refusal to eat, and medication management Referring Physician:  Stephania Fragmin, MD Method of visit?: Face to Face    Principal Problem: Acute encephalopathy Diagnosis:  Principal Problem:   Acute encephalopathy Active Problems:   Hypoglycemia   Constipation   Bipolar disorder (HCC)   Diabetes (HCC)   AMS (altered mental status)   Poor appetite   Intellectual Disability   Psychogenic loss of appetite   Subjective: [name] is a 55 year old female admitted for evaluation of acute mental status change and consulted to the psychiatry service for potential medication management in light of flat affect and refusal to eat. This morning, she was mildly drowsy but arousable and answered questions. She is A&O to person, time, and place, and she answers questions appropriately. She was sitting up in bed in soft bilateral wrist restraints with the Cortrak placed in her nose. Her mood is "awful" but she does not explain further. She says that her mouth is dry but refuses water; she also denies being in any pain. We again discussed placement of the G-tube since she continues to refuse to eat, and she nodded in understanding that that was the next step (although her sister is guardian and has already agreed per IR and primary teams).     Treatment Plan Summary: Although she continues to refuse to eat, [name] is mostly appropriate in her responses. She appears to a little less drowsy on the current decreased dosage of Haldol. Her symptoms are likely due to her intellectual disability, in which she is able to perform most tasks without or with minimal assistance and has some maladaptive behaviors. [name] to have G-tube placed today per IR.   IDD symptomology: Food refusal/ psychogenic loss of appetite  -IM PRNs: Haldol 5 mg and Ativan 2 mg added for increased agitation during procedure. -Adjust Haldol  to 2.5 mg per tube qAM and at lunchtime, and 7.5 mg qHS per tube.  Qtc 445.  -Continue Cogentin 1 mg per tube daily. -Continue Remeron 7.5 mg qHS for appetite stimulation -Tanya confirmed that she would like for [name] to have SNF placement for management of her G-tube. SW kindly assisting with placement.    Disposition: SNF placement upon discharge. Psychiatry will continue to follow during this admission.   Lamar Sprinkles, MD PGY-1 02/11/2021 [facility] Department of Psychiatry

## 2021-02-11 NOTE — TOC Progression Note (Addendum)
Transition of Care [facility]) - Progression Note    Patient Details  Name: [name] MRN: [id number] Date of Birth: 05/04/1966  Transition of [facility]) CM/SW [place], RN Phone Number: 02/11/2021, 1:56 PM  Clinical Narrative:    Case management met with the patient at the bedside this morning to assess the patient for transitions of care needs.  The patient was alert and oriented to place and self only and continued to be restrained with bilateral soft wrist restraints to protect Cortrak for nutrition.  I spoke with Erin Hearing, NP , Dr. Loleta Books and Dr. Earley Favor regarding this patient and was concerned if the patient was placed at a skilled nursing facility that she would would need appropriate psychiatric follow up and medication adjustments as necessary per psychiatry to assist in treatment of her fixed delusions, inability to eat or walk and care of her feeding tube at the facility.  It would be difficult to find an accepting SNF facility in [place], [place] that would be able to provide care for her psychiatric and physical needs due to her IDD.  I spoke with Dr Earley Favor and the patient will be scheduled for the PEG placement today, but TOC team would assist the patient for possible placement for inpatient care at [facility] or be placed in a SNF facility to receive appropriate outpatient psychiatric treatments as necessary.  DTP Team will continue to follow the patient for inpatient psych at [facility] versus placement at [facility] in [place], [place] if necessary.  02/11/2021 1500 - I called and spoke with Junie Panning, CM at [facility] and she said the facility would be willing to review the patient's clinicals if admission were appropriate per Dr. Earley Favor.  Expected Discharge Plan: [place] Barriers to Discharge: Continued Medical Work up, Requiring sitter/restraints  Expected Discharge Plan and  Services Expected Discharge Plan: [place] In-house Referral: Clinical Social Work Discharge Planning Services: CM Consult Post Acute Care Choice: [place] arrangements for the past 2 months: Single Family Home                                       Social Determinants of Health (SDOH) Interventions    Readmission Risk Interventions No flowsheet data found.

## 2021-02-11 NOTE — Progress Notes (Signed)
Due to IR scheduling conflicts we are unable to bring this patient down for gastrostomy tube placement today. We will tentatively plan for Monday 02/14/21. Dr. Maryfrances Bunnell aware.   Please call IR with any questions.  Alwyn Ren, [place] [phone] 02/11/2021, 4:24 PM

## 2021-02-11 NOTE — NC FL2 (Addendum)
[place] MEDICAID FL2 LEVEL OF CARE SCREENING TOOL     IDENTIFICATION  Patient Name: [name] Birthdate: 1966-04-28 Sex: female Admission Date (Current Location): 01/23/2021  [place] and [place] Number:  Haynes Bast [id number] Facility and Address:  [facility] [place], [address], [place], [place] [address]      Provider Number: [id number]  Attending Physician Name and Address:  Alberteen Sam, *  Relative Name and Phone Number:  [name] - sister - [phone]    Current Level of Care: Hospital Recommended Level of Care: Skilled Nursing Facility Prior Approval Number:    Date Approved/Denied:   PASRR Number: pending Level 2  Discharge Plan: SNF    Current Diagnoses: Patient Active Problem List   Diagnosis Date Noted   Poor appetite 01/26/2021   AMS (altered mental status) 01/25/2021   Constipation 01/24/2021   Bipolar disorder (HCC) 01/24/2021   Diabetes (HCC) 01/24/2021   Observed seizure-like activity (HCC) 10/31/2020   Seizure (HCC) 10/31/2020   Hypoglycemia 10/31/2020   Elevated LFTs 10/31/2020   Acute encephalopathy 10/30/2020   Change in mental status 10/29/2020   Intellectual disability    Confusion     Orientation RESPIRATION BLADDER Height & Weight     Self, Place  Normal External catheter Weight: 71.9 kg Height:     BEHAVIORAL SYMPTOMS/MOOD NEUROLOGICAL BOWEL NUTRITION STATUS    Convulsions/Seizures (Last documented seizure - 10/2020) Continent Feeding tube  AMBULATORY STATUS COMMUNICATION OF NEEDS Skin   Limited Assist (Patient unwilling to walk due to fixed delusion at the present.) Verbally Other (Comment) (PEG placement on 02/11/2021)                       Personal Care Assistance Level of Assistance  Bathing, Feeding, Dressing, Total care   Feeding assistance: Maximum assistance (PEG tube feeding) Dressing Assistance: Maximum assistance Total Care Assistance: Maximum assistance   Functional  Limitations Info  Sight, Hearing, Speech Sight Info: Adequate Hearing Info: Adequate Speech Info: Adequate    SPECIAL CARE FACTORS FREQUENCY  PT (By licensed PT), OT (By licensed OT)     PT Frequency: 2-3 times per week OT Frequency: 2-3 times per week            Contractures Contractures Info: Not present    Additional Factors Info  Allergies, Psychotropic, Code Status, Insulin Sliding Scale Code Status Info: Full code Allergies Info: PCN, sulfa, tomato Psychotropic Info: cogentin, haldol, remeron Insulin Sliding Scale Info: see discharge summary       Current Medications (02/11/2021):  This is the current hospital active medication list Current Facility-Administered Medications  Medication Dose Route Frequency Provider Last Rate Last Admin   acetaminophen (TYLENOL) tablet 650 mg  650 mg Per Tube Q6H PRN Burnadette Pop, MD   650 mg at 02/09/21 1954   Or   acetaminophen (TYLENOL) suppository 650 mg  650 mg Rectal Q6H PRN Burnadette Pop, MD       benztropine (COGENTIN) tablet 1 mg  1 mg Per Tube Daily Holly Iannaccone, Earl Lites, MD   1 mg at 02/11/21 1003   bisacodyl (DULCOLAX) suppository 10 mg  10 mg Rectal Q1500 Amin, Ankit Chirag, MD   10 mg at 02/10/21 1645   dextromethorphan-guaiFENesin (MUCINEX DM) 30-600 MG per 12 hr tablet 1 tablet  1 tablet Oral BID PRN Amin, Ankit Chirag, MD       enoxaparin (LOVENOX) injection 40 mg  40 mg Subcutaneous Q24H John Giovanni, MD  40 mg at 02/10/21 0901   feeding supplement (GLUCERNA 1.2 CAL) liquid 1,000 mL  1,000 mL Per Tube Continuous Berton Mount I, MD 65 mL/hr at 02/11/21 0732 1,000 mL at 02/11/21 0732   free water 100 mL  100 mL Per Tube Q4H Adhikari, Amrit, MD   100 mL at 02/11/21 0800   haloperidol (HALDOL) tablet 2.5 mg  2.5 mg Per Tube Daily Gargi Berch, Earl Lites, MD   2.5 mg at 02/11/21 1008   haloperidol (HALDOL) tablet 7.5 mg  7.5 mg Per Tube QHS Lamar Sprinkles, MD   7.5 mg at 02/10/21 2159   haloperidol lactate  (HALDOL) injection 2 mg  2 mg Intramuscular Q6H PRN Mansy, Jan A, MD   2 mg at 02/10/21 0542   haloperidol lactate (HALDOL) injection 5 mg  5 mg Intramuscular Once PRN Lamar Sprinkles, MD       hydrALAZINE (APRESOLINE) injection 10 mg  10 mg Intravenous Q4H PRN Amin, Ankit Chirag, MD       insulin aspart (novoLOG) injection 0-15 Units  0-15 Units Subcutaneous Q4H Chrishawna Farina, Earl Lites, MD   5 Units at 02/11/21 0923   insulin glargine-yfgn (SEMGLEE) injection 26 Units  26 Units Subcutaneous BID Alberteen Sam, MD   26 Units at 02/11/21 1003   ipratropium-albuterol (DUONEB) 0.5-2.5 (3) MG/3ML nebulizer solution 3 mL  3 mL Nebulization Q4H PRN Amin, Ankit Chirag, MD       levETIRAcetam (KEPPRA) 100 MG/ML solution 1,000 mg  1,000 mg Per Tube BID Norva Pavlov, RPH   1,000 mg at 02/11/21 1003   LORazepam (ATIVAN) injection 2 mg  2 mg Intramuscular Once PRN Lamar Sprinkles, MD       metoprolol tartrate (LOPRESSOR) injection 2.5 mg  2.5 mg Intravenous Q6H PRN Dow Adolph N, DO   2.5 mg at 02/09/21 1954   mirtazapine (REMERON) tablet 7.5 mg  7.5 mg Per Tube QHS Burnadette Pop, MD   7.5 mg at 02/10/21 2159   multivitamin with minerals tablet 1 tablet  1 tablet Per Tube Daily Burnadette Pop, MD   1 tablet at 02/11/21 1003   polyethylene glycol (MIRALAX / GLYCOLAX) packet 17 g  17 g Per Tube BID Burnadette Pop, MD   17 g at 02/10/21 2158   senna-docusate (Senokot-S) tablet 1 tablet  1 tablet Per Tube QHS PRN Burnadette Pop, MD   1 tablet at 01/31/21 2134   sodium chloride flush (NS) 0.9 % injection 3 mL  3 mL Intravenous Once John Giovanni, MD       valACYclovir (VALTREX) tablet 500 mg  500 mg Per Tube QHS Burnadette Pop, MD   500 mg at 02/10/21 2159     Discharge Medications: Please see discharge summary for a list of discharge medications.  Relevant Imaging Results:  Relevant Lab Results:   Additional Information SS# [id number]  Janae Bridgeman, RN

## 2021-02-11 NOTE — Progress Notes (Addendum)
RE:  [name] Date of Birth:  1966/05/31 Date:  02/11/2021  To Whom it May Concern,  Please be advised that the above-named patient will require a short-term nursing home stay - anticipated 30 days or less for rehabilitation and management of PEG tube.  The plan is for discharge to a group home setting.

## 2021-02-11 NOTE — Progress Notes (Signed)
[facility]  INTERIM SUMMARY    SURIE SUCHOCKI  NID:[id number] DOB: 1965/06/26 DOA: 01/23/2021 PCP: Mirna Mires, MD     Brief Narrative and interim summary:  Ms Vollmer is a 55 y.o. F with DM, HTN, developmental delay who presented with decreased oral intake and "saying bizarre things".  In the ER, CT head unremarkable.  She was hypoglycemic and otherwise had normal chemistries and hemogram.  Admitted for evaluation of erratic behavior.     The patient was admitted for neurological evaluation of suspected encephalopathy.  However, brain imaging was normal, EEG unremarkable, UA negative, urine drug screen negative, procalcitonin low, B12, ammonia, and TSH normal.  Acute metabolic encephalopathy was ruled out.  In the meantime psychiatry were consulted, who noted a temporal relationship between the patient moving from her structured living setting (group home?)  To live with her sister, and the subsequent presentation of her fixed delusions about eating and then cessation of oral intake.   Their primary recommendation is to help return the patient to a structured environment, either by creating structure at home or placing in a structured group home or nursing facility.  Throughout this, her management has been complicated by persistent fixed delusions and continued refusal to eat.  An NG tube was placed for enteral nutrition.  As she has made no progress to date in accepting oral intake, Psychiatry recommended PEG and Ethics were consulted.    As stated previously, "alternative route for fluids and nutrition must be sought.  The patient would have long term medical harm (likely starvation and death) if we did nothing.  It is my medical opinion that enteral nutrition via PEG is safer than TPN, IV fluids, or prolonged NG tube, and her most beneficent therapy.  Her POA agrees.  We will make all reasonable efforts to provide optimal management of her potential PEG and tube feeds,  and are working towards disposition to a monitored setting with SW.  Given that the patient may not assent to PEG, an important mitigation of risk will be to use a binder to discourage manipulation of the PEG, choice of PEG hardware that causes minimal harm if removed by the patient, and placement in a monitored setting."   IR plan for placement today.            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  On Lantus 40 nightly at home.  Note, she has hyperglycemia here due to TFs, when her tube feeds are stopped, her sugars drop considerably. - Continue glargine  - Increase sliding scale  Obesity BMI >30  Hypokalemia Hypomagnesemia Resolved            Disposition: Status is: Inpatient  Remains inpatient appropriate because:IV treatments appropriate due to intensity of illness or inability to take PO  Dispo: The patient is from: Home              Anticipated d/c is to: SNF              Patient currently is not medically  stable to d/c.   Difficult to place patient No   Level of care: Med-Surg     Patient was admitted for refusal to eat.  This is a behavioral response in the setting of IDD.  A primary treatment to resolve this will be placmeent in a structured environment, and so we seek placement at this time.  The setting should be determined by TOC, based on her current needs for tube feeds.     Her oral intake aversion may be prolonged, and so we will place PEG.  Ethics have commented on our efforts, as documented on  8/24               MDM: The below labs and imaging reports were reviewed and summarized above.  Medication management as above.    DVT prophylaxis: enoxaparin (LOVENOX) injection 40 mg Start: 01/24/21 1000  Code Status: FULL Family Communication:             Subjective: She complains of a little puffiness in her right hand, due to IV fluids.  She also complains that her left wrist restraint is a little too tight, because she is leaning to the right.  No fever, vomiting, diarrhea.  She still refuses.  She has no pain complaints.          Objective: Vitals:   02/11/21 0006 02/11/21 0344 02/11/21 0409 02/11/21 0900  BP: 138/82 138/77  133/79  Pulse: (!) 110 94  100  Resp: 18 18  18   Temp: (!) 97.5 F (36.4 C) 97.6 F (36.4 C)  99.2 F (37.3 C)  TempSrc: Oral Oral  Oral  SpO2: 99% 100%  96%  Weight:   71.9 kg     Intake/Output Summary (Last 24 hours) at 02/11/2021 1140 Last data filed at 02/11/2021 0731 Gross per 24 hour  Intake 2569 ml  Output 1300 ml  Net 1269 ml   Filed Weights   02/09/21 0427 02/10/21 0433 02/11/21 0409  Weight: 73.2 kg 71.9 kg 71.9 kg    Examination: General appearance: Female, lying in bed, no acute distress  HEENT: Left eye appears normal. Skin:  Cardiac: Heart rate fast, regular, no murmurs, no lower extremity edema Respiratory: Normal respiratory rate and rhythm, lungs clear without rales or wheezes Abdomen: Abdomen soft without tenderness palpation or guarding, no ascites or distention MSK:  Neuro: Alert, face symmetric, moves upper extremities with normal strength in restraints, speech fluent Psych: Oriented to self and place, "hospital", refuses to eat.       Data Reviewed: I have personally reviewed following labs and imaging studies:  CBC: No results for input(s): WBC, NEUTROABS, HGB, HCT, MCV, PLT in the last 168 hours. Basic Metabolic Panel: Recent Labs  Lab 02/09/21 0423  MG 2.3  PHOS 4.8*    GFR: Estimated Creatinine Clearance: 71.2 mL/min (by C-G formula based on SCr of 0.72 mg/dL). Liver Function Tests: No results for input(s): AST, ALT, ALKPHOS, BILITOT, PROT, ALBUMIN in the last 168 hours. No results for input(s): LIPASE, AMYLASE in the last 168 hours. No results for input(s): AMMONIA in the last 168 hours. Coagulation Profile: No results for input(s): INR, PROTIME in the last 168 hours. Cardiac Enzymes: No results for input(s): CKTOTAL, CKMB, CKMBINDEX, TROPONINI in the last 168 hours. BNP (last 3 results) No results for input(s): PROBNP in the last 8760 hours. HbA1C: No results for input(s): HGBA1C in the last 72 hours. CBG: Recent Labs  Lab 02/10/21 2028 02/10/21 2157 02/10/21 2354 02/11/21 0356  02/11/21 0839  GLUCAP 193* 191* 185* 205* 213*   Lipid Profile: No results for input(s): CHOL, HDL, LDLCALC, TRIG, CHOLHDL, LDLDIRECT in the last 72 hours. Thyroid Function Tests: No results for input(s): TSH, T4TOTAL, FREET4, T3FREE, THYROIDAB in the last 72 hours. Anemia Panel: No results for input(s): VITAMINB12, FOLATE, FERRITIN, TIBC, IRON, RETICCTPCT in the last 72 hours. Urine analysis:    Component Value Date/Time   COLORURINE YELLOW 01/24/2021 0907   APPEARANCEUR CLEAR 01/24/2021 0907   LABSPEC 1.021 01/24/2021 0907   PHURINE 5.0 01/24/2021 0907   GLUCOSEU 150 (A) 01/24/2021 0907   HGBUR NEGATIVE 01/24/2021 0907   BILIRUBINUR NEGATIVE 01/24/2021 0907   KETONESUR 20 (A) 01/24/2021 0907   PROTEINUR NEGATIVE 01/24/2021 0907   UROBILINOGEN 0.2 04/27/2010 1438   NITRITE NEGATIVE 01/24/2021 0907   LEUKOCYTESUR NEGATIVE 01/24/2021 0907   Sepsis Labs: @LABRCNTIP (procalcitonin:4,lacticacidven:4)  )No results found for this or any previous visit (from the past 240 hour(s)).       Radiology Studies: No results found.      Scheduled Meds:  benztropine  1 mg Per Tube Daily   bisacodyl  10 mg Rectal Q1500   enoxaparin (LOVENOX) injection  40  mg Subcutaneous Q24H   free water  100 mL Per Tube Q4H   haloperidol  2.5 mg Per Tube Daily   haloperidol  7.5 mg Per Tube QHS   insulin aspart  0-15 Units Subcutaneous Q4H   insulin glargine-yfgn  26 Units Subcutaneous BID   levETIRAcetam  1,000 mg Per Tube BID   mirtazapine  7.5 mg Per Tube QHS   multivitamin with minerals  1 tablet Per Tube Daily   polyethylene glycol  17 g Per Tube BID   sodium chloride flush  3 mL Intravenous Once   valACYclovir  500 mg Per Tube QHS   Continuous Infusions:  feeding supplement (GLUCERNA 1.2 CAL) 1,000 mL (02/11/21 0732)     LOS: 17 days    Time spent: 25 minutes    02/13/21, MD [facility] 02/11/2021, 11:40 AM     Please page though AMION or Epic secure chat:  For 02/13/2021, [employer]

## 2021-02-11 NOTE — Evaluation (Signed)
Physical Therapy Evaluation Patient Details Name: [name] MRN: [id number] DOB: [birth year] Today's Date: 02/11/2021   History of Present Illness  55 y.o. female with medical history significant of intellectual disability, bipolar disorder, seizure disorder, type II diabetes, hypertension, hyperlipidemia, immune deficiency disorder presenting to the ED for evaluation of altered mental status  Clinical Impression  Patient presents with decreased mobility due to c/o pain and psychological issues.  Sister reports she was independent with mobility and BADL's prior to this episode.  Patient resistant to movement and needing increased time, much encouragement and finally unable to perform OOB transfer, but did get to EOB with min to mod A.  Patient may benefit from SNF level rehab at d/c.  PT to continue to follow acutely.     Follow Up Recommendations SNF    Equipment Recommendations  None recommended by PT    Recommendations for Other Services       Precautions / Restrictions Precautions Precautions: Fall Precaution Comments: Wrist restraints.  Cortrak. Restrictions Weight Bearing Restrictions: No Other Position/Activity Restrictions: Currently very resistant to movement.  Psych is following      Mobility  Bed Mobility Overal bed mobility: Needs Assistance Bed Mobility: Supine to Sit;Sit to Supine;Rolling Rolling: Max assist   Supine to sit: HOB elevated;Supervision Sit to supine: Mod assist   General bed mobility comments: pushing to scoot out in response to helping her lay back down, would not scoot out to command or when  I attempted to help; initially tried to change linens helping her roll, but she screamed and reported back pain, then sat up    Transfers                 General transfer comment: unsafe when trying with one assist.  Ambulation/Gait                Stairs            Wheelchair Mobility    Modified Rankin (Stroke Patients  Only)       Balance Overall balance assessment: Needs assistance Sitting-balance support: Feet unsupported Sitting balance-Leahy Scale: Fair                                       Pertinent Vitals/Pain Pain Assessment: Faces Faces Pain Scale: Hurts even more Pain Location: legs, arms "swolen" Pain Descriptors / Indicators: Grimacing;Guarding Pain Intervention(s): Monitored during session;Limited activity within patient's tolerance    Home Living Family/patient expects to be discharged to:: Other (Comment) (Possible psyche admit) Living Arrangements: Other relatives;Other (Comment) (sister) Available Help at Discharge: Family;Available 24 hours/day Type of Home: House Home Access: Level entry     Home Layout: One level   Additional Comments: All information gathered from the chart.  Patient unable to give any details regarding home setup, or prior level of function.    Prior Function Level of Independence: Needs assistance         Comments: prior to this episode, sister reports patient was independent with getting a snack, showering, watching her favorite show     Hand Dominance        Extremity/Trunk Assessment   Upper Extremity Assessment Upper Extremity Assessment: Difficult to assess due to impaired cognition    Lower Extremity Assessment Lower Extremity Assessment: Difficult to assess due to impaired cognition       Communication   Communication: Other (comment) (states can't talk  due to coretrack)  Cognition Arousal/Alertness: Awake/alert Behavior During Therapy: Anxious;Agitated Overall Cognitive Status: No family/caregiver present to determine baseline cognitive functioning Area of Impairment: Safety/judgement;Following commands;Attention;Problem solving                 Orientation Level: Disoriented to;Situation;Time Current Attention Level: Focused   Following Commands: Follows one step commands inconsistently;Follows one  step commands with increased time Safety/Judgement: Decreased awareness of deficits;Decreased awareness of safety Awareness: Intellectual Problem Solving: Slow processing;Decreased initiation;Requires verbal cues;Requires tactile cues General Comments: withdraws to touch, reports pain in arms and swollen legs, fearful and not wanting to move nor for me to help her, encouraged up to EOB at least due to bed soiled with urine      General Comments General comments (skin integrity, edema, etc.): called sister, [name] on phone to attempt to get pt to participate, she gave info on prior level of function, but pt not responding any better to commands with her on the phone    Exercises     Assessment/Plan    PT Assessment Patient needs continued PT services  PT Problem List Decreased strength;Decreased mobility;Decreased activity tolerance;Decreased cognition;Decreased safety awareness;Decreased knowledge of use of DME;Decreased balance       PT Treatment Interventions DME instruction;Therapeutic activities;Gait training;Therapeutic exercise;Patient/family education;Cognitive remediation;Balance training;Functional mobility training;Neuromuscular re-education    PT Goals (Current goals can be found in the Care Plan section)  Acute Rehab PT Goals Patient Stated Goal: None stated PT Goal Formulation: Patient unable to participate in goal setting Time For Goal Achievement: 02/25/21 Potential to Achieve Goals: Fair    Frequency Min 2X/week   Barriers to discharge        Co-evaluation               AM-PAC PT "6 Clicks" Mobility  Outcome Measure Help needed turning from your back to your side while in a flat bed without using bedrails?: Total Help needed moving from lying on your back to sitting on the side of a flat bed without using bedrails?: A Little Help needed moving to and from a bed to a chair (including a wheelchair)?: Total Help needed standing up from a chair using your  arms (e.g., wheelchair or bedside chair)?: Total Help needed to walk in hospital room?: Total Help needed climbing 3-5 steps with a railing? : Total 6 Click Score: 8    End of Session   Activity Tolerance: Other (comment) (limited due to lack of participation) Patient left: in bed;with call bell/phone within reach;with bed alarm set;with restraints reapplied Nurse Communication: Mobility status (needs new pure wick) PT Visit Diagnosis: Other abnormalities of gait and mobility (R26.89);Other symptoms and signs involving the nervous system (R29.898)    Time: 6160-7371 PT Time Calculation (min) (ACUTE ONLY): 40 min   Charges:   PT Evaluation $PT Eval Moderate Complexity: 1 Mod PT Treatments $Therapeutic Activity: 23-37 mins        Sheran Lawless, PT Acute Rehabilitation Services Pager:[phone] Office:[phone] 02/11/2021   Elray Mcgregor 02/11/2021, 4:58 PM

## 2021-02-12 DIAGNOSIS — G9340 Encephalopathy, unspecified: Secondary | ICD-10-CM | POA: Diagnosis not present

## 2021-02-12 LAB — GLUCOSE, CAPILLARY
Glucose-Capillary: 140 mg/dL — ABNORMAL HIGH (ref 70–99)
Glucose-Capillary: 88 mg/dL (ref 70–99)
Glucose-Capillary: 131 mg/dL — ABNORMAL HIGH (ref 70–99)
Glucose-Capillary: 218 mg/dL — ABNORMAL HIGH (ref 70–99)
Glucose-Capillary: 153 mg/dL — ABNORMAL HIGH (ref 70–99)
Glucose-Capillary: 240 mg/dL — ABNORMAL HIGH (ref 70–99)

## 2021-02-12 NOTE — Progress Notes (Signed)
Refused labs to be done at this time

## 2021-02-12 NOTE — Progress Notes (Signed)
[facility]  INTERIM SUMMARY    [name]  TTS:[id number] DOB: [birth year] DOA: 01/23/2021 PCP: Mirna Mires, MD     Brief Narrative and interim summary:  [name] is a 55 y.o. F with DM, HTN, developmental delay who presented with decreased oral intake and "saying bizarre things".  In the ER, CT head unremarkable.  She was hypoglycemic and otherwise had normal chemistries and hemogram.  Admitted for evaluation of erratic behavior.     The patient was admitted for neurological evaluation of suspected encephalopathy.  However, brain imaging was normal, EEG unremarkable, UA negative, urine drug screen negative, procalcitonin low, B12, ammonia, and TSH normal.  Acute metabolic encephalopathy was ruled out.  In the meantime psychiatry were consulted, who noted a temporal relationship between the patient moving from her structured living setting (group home?)  To live with her sister, and the subsequent presentation of her fixed delusions about eating and then cessation of oral intake.   Their primary recommendation is to help return the patient to a structured environment, either by creating structure at home or placing in a structured group home or nursing facility.  Throughout this, her management has been complicated by persistent fixed delusions and continued refusal to eat.  An NG tube was placed for enteral nutrition.  As she has made no progress to date in accepting oral intake, Psychiatry recommended PEG and Ethics were consulted.    As stated previously, "alternative route for fluids and nutrition must be sought.  The patient would have long term medical harm (likely starvation and death) if we did nothing.  It is my medical opinion that enteral nutrition via PEG is safer than TPN, IV fluids, or prolonged NG tube, and her most beneficent therapy.  Her POA agrees.  We will make all reasonable efforts to provide optimal management of her potential PEG and tube feeds,  and are working towards disposition to a monitored setting with SW.  Given that the patient may not assent to PEG, an important mitigation of risk will be to use a binder to discourage manipulation of the PEG, choice of PEG hardware that causes minimal harm if removed by the patient, and placement in a monitored setting."   Currently plan is for placement for PEG tube on 8/29.  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  On Lantus 40 nightly at home.  Note, she has hyperglycemia here due to TFs, when her tube feeds are stopped, her sugars drop considerably. - Continue glargine  - Increase sliding scale  Obesity BMI >30  Hypokalemia Hypomagnesemia Resolved            Disposition: Status is: Inpatient  Remains inpatient appropriate because:IV treatments appropriate due to intensity of illness or inability to take PO  Dispo: The patient is from: Home              Anticipated d/c is to: SNF              Patient currently is not medically stable to d/c.  Difficult to place patient No   Level of care: Med-Surg     Patient was admitted for refusal to eat.  This is a behavioral response in the setting of IDD.  A primary treatment to resolve this will be placmeent in a structured environment, and so we seek placement at this time.  The setting should be determined by TOC, based on her current needs for tube feeds.     Her oral intake aversion may be prolonged, and so we will place PEG.  Ethics have commented on our efforts, as  documented on 8/24               MDM: The below labs and imaging reports were reviewed and summarized above.  Medication management as above.    DVT prophylaxis: enoxaparin (LOVENOX) injection 40 mg Start: 01/24/21 1000  Code Status: FULL Family Communication:             Subjective: Upper extremity edema still present.  No nausea no vomiting.  Denies any acute complaint.  Denies any pain.  Objective: Vitals:   02/12/21 0345 02/12/21 0800 02/12/21 1155 02/12/21 1600  BP: (!) 146/78 (!) 150/97 (!) 145/84 137/76  Pulse: (!) 105 (!) 110  (!) 104  Resp: 16 17  17   Temp: 99.1 F (37.3 C) 99 F (37.2 C) 99 F (37.2 C) 99 F (37.2 C)  TempSrc: Oral Axillary Axillary Axillary  SpO2: 99% 98% 97% 97%  Weight:        Intake/Output Summary (Last 24 hours) at 02/12/2021 1917 Last data filed at 02/12/2021 1155 Gross per 24 hour  Intake 360 ml  Output 450 ml  Net -90 ml    Filed Weights   02/09/21 0427 02/10/21 0433 02/11/21 0409  Weight: 73.2 kg 71.9 kg 71.9 kg    Examination: General: Appear in mild distress, no Rash; Oral Mucosa Clear, moist. no Abnormal Neck Mass Or lumps, Conjunctiva normal  Cardiovascular: S1 and S2 Present, no Murmur, Respiratory: good respiratory effort, Bilateral Air entry present and CTA, no Crackles, no wheezes Abdomen: Bowel Sound present, Soft and no tenderness Extremities: no Pedal edema, trace upper extremity edema bilateral Neurology: alert and not oriented to time, place, and person affect flat. no new focal deficit Gait not checked due to patient safety concerns     Data Reviewed: I have personally reviewed following labs and imaging studies:  CBC: No results for input(s): WBC, NEUTROABS, HGB, HCT, MCV, PLT in the last 168 hours. Basic Metabolic Panel: Recent Labs  Lab 02/09/21 0423  MG 2.3  PHOS 4.8*    GFR: Estimated Creatinine Clearance: 71.2 mL/min (by C-G formula based on SCr of 0.72 mg/dL). Liver  Function Tests: No results for input(s): AST, ALT, ALKPHOS, BILITOT, PROT, ALBUMIN in the last 168 hours. No results for input(s): LIPASE, AMYLASE in the last 168 hours. No results for input(s): AMMONIA in the last 168 hours. Coagulation Profile: No results for input(s): INR, PROTIME in the last 168 hours. Cardiac Enzymes: No results for input(s): CKTOTAL, CKMB, CKMBINDEX, TROPONINI in the last 168 hours. BNP (last 3 results) No results for input(s): PROBNP in the last 8760 hours. HbA1C: No results for input(s): HGBA1C in the last 72 hours. CBG: Recent Labs  Lab 02/12/21 0114 02/12/21 0412 02/12/21 0730 02/12/21 1158 02/12/21 1520  GLUCAP 140* 88 131* 218* 153*    Lipid Profile: No results for input(s): CHOL, HDL, LDLCALC, TRIG, CHOLHDL, LDLDIRECT in the last 72 hours. Thyroid Function Tests: No results  for input(s): TSH, T4TOTAL, FREET4, T3FREE, THYROIDAB in the last 72 hours. Anemia Panel: No results for input(s): VITAMINB12, FOLATE, FERRITIN, TIBC, IRON, RETICCTPCT in the last 72 hours. Urine analysis:    Component Value Date/Time   COLORURINE YELLOW 01/24/2021 0907   APPEARANCEUR CLEAR 01/24/2021 0907   LABSPEC 1.021 01/24/2021 0907   PHURINE 5.0 01/24/2021 0907   GLUCOSEU 150 (A) 01/24/2021 0907   HGBUR NEGATIVE 01/24/2021 0907   BILIRUBINUR NEGATIVE 01/24/2021 0907   KETONESUR 20 (A) 01/24/2021 0907   PROTEINUR NEGATIVE 01/24/2021 0907   UROBILINOGEN 0.2 04/27/2010 1438   NITRITE NEGATIVE 01/24/2021 0907   LEUKOCYTESUR NEGATIVE 01/24/2021 0907   Sepsis Labs: @LABRCNTIP (procalcitonin:4,lacticacidven:4)  )No results found for this or any previous visit (from the past 240 hour(s)).       Radiology Studies: No results found.      Scheduled Meds:  benztropine  1 mg Per Tube Daily   bisacodyl  10 mg Rectal Q1500   enoxaparin (LOVENOX) injection  40 mg Subcutaneous Q24H   free water  100 mL Per Tube Q4H   haloperidol  2.5 mg Per Tube BID WC    haloperidol  5 mg Per Tube QHS   insulin aspart  0-20 Units Subcutaneous Q4H   insulin glargine-yfgn  22 Units Subcutaneous BID   levETIRAcetam  1,000 mg Per Tube BID   mirtazapine  7.5 mg Per Tube QHS   multivitamin with minerals  1 tablet Per Tube Daily   polyethylene glycol  17 g Per Tube BID   valACYclovir  500 mg Per Tube QHS   Continuous Infusions:  feeding supplement (GLUCERNA 1.2 CAL) 1,000 mL (02/12/21 1200)     LOS: 18 days    Time spent: 25 minutes    02/14/21, MD [facility] 02/12/2021, 7:17 PM     Please page though AMION or Epic secure chat:  For 02/14/2021, [employer]

## 2021-02-12 NOTE — Evaluation (Signed)
Speech Language Pathology Evaluation Patient Details Name: [name] MRN: [id number] DOB: [birth year] Today's Date: 02/12/2021 Time: 1215-1225 SLP Time Calculation (min) (ACUTE ONLY): 10 min  Problem List:  Patient Active Problem List   Diagnosis Date Noted   Psychogenic loss of appetite    Intellectual delay    Poor appetite 01/26/2021   AMS (altered mental status) 01/25/2021   Constipation 01/24/2021   Bipolar disorder (HCC) 01/24/2021   Diabetes (HCC) 01/24/2021   Observed seizure-like activity (HCC) 10/31/2020   Seizure (HCC) 10/31/2020   Hypoglycemia 10/31/2020   Elevated LFTs 10/31/2020   Acute encephalopathy 10/30/2020   Change in mental status 10/29/2020   Intellectual disability    Confusion    Past Medical History:  Past Medical History:  Diagnosis Date   Diabetes mellitus without complication (HCC)    Hypertension    Immune deficiency disorder (HCC)    Mild mental retardation    Past Surgical History: History reviewed. No pertinent surgical history. HPI:  55 y.o. female with medical history significant of intellectual disability, bipolar disorder, seizure disorder, type II diabetes, hypertension, hyperlipidemia, immune deficiency disorder presenting to the ED for evaluation of altered mental status   Assessment / Plan / Recommendation Clinical Impression  Pt with hx of cognitive deficits in setting of intellectual disability, also note hx of bipolar disorder, suspect pts deficits are close to or at baseline however no family or caregiver present to determine functioning. Of note, MRI of head without acute intracranial abnormalities this admission. Per chart review pt was living at group home and then with sister. Pt oriented x2 this date and exhibits global deficits in attention, problem solving, executive function.  Given pts medical hx, unremarkable neurological imaging, no cognitive intervention is recommended at this time.    SLP Assessment  SLP  Recommendation/Assessment: Patient does not need any further Speech Lanaguage Pathology Services SLP Visit Diagnosis: Cognitive communication deficit (R41.841)    Follow Up Recommendations  Skilled Nursing facility;24 hour supervision/assistance    Frequency and Duration   N/a        SLP Evaluation Cognition  Overall Cognitive Status: No family/caregiver present to determine baseline cognitive functioning Arousal/Alertness: Awake/alert Orientation Level: Oriented to place;Oriented to person Attention: Focused;Sustained Focused Attention: Impaired Sustained Attention: Impaired Memory: Impaired Awareness: Impaired Problem Solving: Impaired Executive Function: Sequencing;Self Monitoring Sequencing: Impaired Safety/Judgment: Impaired       Comprehension  Auditory Comprehension Overall Auditory Comprehension: Appears within functional limits for tasks assessed    Expression Expression Primary Mode of Expression: Verbal Verbal Expression Overall Verbal Expression: Impaired at baseline (reduced verbal output with intellectual disability and bipolar disorder) Pragmatics: Impairment Written Expression Dominant Hand: Left   Oral / Motor  Oral Motor/Sensory Function Overall Oral Motor/Sensory Function: Within functional limits Motor Speech Overall Motor Speech: Appears within functional limits for tasks assessed   GO                    Ardyth Gal MA, CCC-SLP Acute Rehabilitation Services   02/12/2021, 12:38 PM

## 2021-02-13 DIAGNOSIS — G9340 Encephalopathy, unspecified: Secondary | ICD-10-CM | POA: Diagnosis not present

## 2021-02-13 LAB — MAGNESIUM: Magnesium: 2.3 mg/dL (ref 1.7–2.4)

## 2021-02-13 LAB — GLUCOSE, CAPILLARY
Glucose-Capillary: 128 mg/dL — ABNORMAL HIGH (ref 70–99)
Glucose-Capillary: 131 mg/dL — ABNORMAL HIGH (ref 70–99)
Glucose-Capillary: 156 mg/dL — ABNORMAL HIGH (ref 70–99)
Glucose-Capillary: 167 mg/dL — ABNORMAL HIGH (ref 70–99)
Glucose-Capillary: 169 mg/dL — ABNORMAL HIGH (ref 70–99)
Glucose-Capillary: 179 mg/dL — ABNORMAL HIGH (ref 70–99)
Glucose-Capillary: 244 mg/dL — ABNORMAL HIGH (ref 70–99)

## 2021-02-13 LAB — PHOSPHORUS: Phosphorus: 4 mg/dL (ref 2.5–4.6)

## 2021-02-13 NOTE — Progress Notes (Signed)
[facility] PROGRESS NOTE  Patient: [name] [id number]:[id number]   PCP: Mirna Mires, MD DOB: 05/14/1966   DOA: 01/23/2021   DOS: 02/13/2021    Subjective: Patient minimally interactive.  No nausea no vomiting.  Denies any acute complaints.  No acute events overnight. Prior to This per RN at bedside patient was interactive.  Objective:  Vitals:   02/13/21 1148 02/13/21 1702  BP: (!) 142/73 137/86  Pulse: (!) 103 99  Resp: 19 14  Temp: 99.8 F (37.7 C) 98.8 F (37.1 C)  SpO2: 96% 96%  S1-S2 present. Clear to auscultation. Bowel sound present. Bilateral restraint present.  Left upper extremities edema present Spontaneously moves all extremities.  Assessment and plan: Encephalopathy. Suspect acute change is secondary to initiation of Haldol. 7.5 mg daily dose on 8/26, changed to 10 mg daily dose on 8/27.  No other significant change in medication seen. Postprocedure tomorrow we will attempt to reduce the dose Haldol and monitor response.  Nutrition. Patient is receiving nutrition via core track. Has a tendency to remove the core track and therefore patient is currently in restraint. Scheduled for PEG tube tomorrow.   Author: Lynden Oxford, MD [facility] 02/13/2021 6:29 PM   If 7PM-7AM, please contact night-coverage at [email or web address]

## 2021-02-13 NOTE — Progress Notes (Signed)
Pt elevated HR causing yellow MEWS MD notified

## 2021-02-14 ENCOUNTER — Inpatient Hospital Stay (HOSPITAL_COMMUNITY): Payer: Medicaid Other

## 2021-02-14 DIAGNOSIS — E876 Hypokalemia: Secondary | ICD-10-CM | POA: Diagnosis not present

## 2021-02-14 HISTORY — PX: IR GASTROSTOMY TUBE MOD SED: IMG625

## 2021-02-14 LAB — GLUCOSE, CAPILLARY
Glucose-Capillary: 70 mg/dL (ref 70–99)
Glucose-Capillary: 105 mg/dL — ABNORMAL HIGH (ref 70–99)
Glucose-Capillary: 223 mg/dL — ABNORMAL HIGH (ref 70–99)
Glucose-Capillary: 157 mg/dL — ABNORMAL HIGH (ref 70–99)
Glucose-Capillary: 94 mg/dL (ref 70–99)
Glucose-Capillary: 83 mg/dL (ref 70–99)

## 2021-02-14 MED ORDER — FENTANYL CITRATE (PF) 100 MCG/2ML IJ SOLN
INTRAMUSCULAR | Status: AC | PRN
  Administered 2021-02-14: 50 ug via INTRAVENOUS

## 2021-02-14 MED ORDER — FENTANYL CITRATE (PF) 100 MCG/2ML IJ SOLN
INTRAMUSCULAR | Status: AC
  Filled 2021-02-14: qty 2

## 2021-02-14 MED ORDER — GLUCAGON HCL RDNA (DIAGNOSTIC) 1 MG IJ SOLR
INTRAMUSCULAR | Status: AC
  Filled 2021-02-14: qty 1

## 2021-02-14 MED ORDER — LIDOCAINE HCL 1 % IJ SOLN
INTRAMUSCULAR | Status: AC
  Filled 2021-02-14: qty 20

## 2021-02-14 MED ORDER — VALACYCLOVIR HCL 500 MG PO TABS
500.0000 mg | ORAL_TABLET | Freq: Every day | ORAL | Status: DC
  Administered 2021-02-15 – 2021-02-22 (×8): 500 mg
  Filled 2021-02-14 (×8): qty 1

## 2021-02-14 MED ORDER — IOHEXOL 240 MG/ML SOLN
50.0000 mL | Freq: Once | INTRAMUSCULAR | Status: AC | PRN
  Administered 2021-02-14: 15 mL via INTRAVENOUS

## 2021-02-14 MED ORDER — GLUCAGON HCL (RDNA) 1 MG IJ SOLR
INTRAMUSCULAR | Status: AC | PRN
Start: 2021-02-14 — End: 2021-02-14
  Administered 2021-02-14: 1 mg via INTRAVENOUS

## 2021-02-14 MED ORDER — INSULIN ASPART 100 UNIT/ML IJ SOLN
0.0000 [IU] | INTRAMUSCULAR | Status: DC
  Administered 2021-02-14: 2 [IU] via SUBCUTANEOUS

## 2021-02-14 MED ORDER — VANCOMYCIN HCL IN DEXTROSE 1-5 GM/200ML-% IV SOLN
INTRAVENOUS | Status: AC
  Filled 2021-02-14: qty 200

## 2021-02-14 MED ORDER — VANCOMYCIN HCL IN DEXTROSE 1-5 GM/200ML-% IV SOLN
INTRAVENOUS | Status: AC | PRN
  Administered 2021-02-14: 1000 mg via INTRAVENOUS

## 2021-02-14 MED ORDER — HALOPERIDOL 5 MG PO TABS: 5.0000 mg | ORAL_TABLET | Freq: Every day | ORAL | Status: DC

## 2021-02-14 MED ORDER — LEVETIRACETAM IN NACL 1000 MG/100ML IV SOLN
1000.0000 mg | Freq: Two times a day (BID) | INTRAVENOUS | Status: AC
  Administered 2021-02-14 – 2021-02-15 (×2): 1000 mg via INTRAVENOUS
  Filled 2021-02-14 (×2): qty 100

## 2021-02-14 MED ORDER — MIDAZOLAM HCL 2 MG/2ML IJ SOLN
INTRAMUSCULAR | Status: AC | PRN
  Administered 2021-02-14: 1 mg via INTRAVENOUS

## 2021-02-14 MED ORDER — MIRTAZAPINE 15 MG PO TABS: 7.5000 mg | ORAL_TABLET | Freq: Every day | ORAL | Status: DC

## 2021-02-14 MED ORDER — MIDAZOLAM HCL 2 MG/2ML IJ SOLN
INTRAMUSCULAR | Status: AC
  Filled 2021-02-14: qty 2

## 2021-02-14 MED ORDER — HALOPERIDOL LACTATE 5 MG/ML IJ SOLN: 2.0000 mg | Freq: Three times a day (TID) | INTRAMUSCULAR | Status: DC

## 2021-02-14 MED ORDER — LEVETIRACETAM 100 MG/ML PO SOLN
1000.0000 mg | Freq: Two times a day (BID) | ORAL | Status: DC
  Administered 2021-02-15 (×2): 1000 mg
  Filled 2021-02-14 (×2): qty 10

## 2021-02-14 NOTE — Consult Note (Signed)
Psychiatry C/L Progress Note   ALBERTO SCHOCH  MRN:  [id number] Reason for Consult:  Flat affect, refusal to eat, and medication management Referring Physician:  Stephania Fragmin, MD Method of visit?: Face to Face    Principal Problem: Acute encephalopathy Diagnosis:  Principal Problem:   Acute encephalopathy Active Problems:   Hypoglycemia   Constipation   Bipolar disorder (HCC)   Diabetes (HCC)   AMS (altered mental status)   Poor appetite   Intellectual Disability   Psychogenic loss of appetite   Subjective: DIETRICH KE is a 55 year old female admitted for evaluation of acute mental status change and consulted to the psychiatry service for potential medication management in light of flat affect and refusal to eat. This morning, she was sleeping after having G-tube placed. She remained sleeping upon afternoon recheck.   Treatment Plan Summary: Although she continues to refuse to eat, Khianna is mostly appropriate in her responses. Her symptoms are likely due to her intellectual disability, in which she is able to perform most tasks without or with minimal assistance and has some maladaptive behaviors. Keiana had G-tube placed today per IR.   IDD symptomology: Food refusal/ psychogenic loss of appetite  -Continue Haldol 2.5 mg per tube qAM and at lunchtime, and 5 mg qHS per tube.  Qtc 445.  -Continue Cogentin 1 mg per tube daily. -Continue Remeron 7.5 mg qHS for appetite stimulation -Tanya confirmed that she would like for [facility] to have placement for management of her G-tube. SW kindly assisting with placement.    Disposition: If accepted by [facility], she would be appropriate for services. If not, SNF placement upon discharge and outpatient psychiatric follow-up. Psychiatry will continue to follow during this admission.     Lamar Sprinkles, MD PGY-1 02/14/2021 [facility] Department of Psychiatry

## 2021-02-14 NOTE — Procedures (Signed)
Interventional Radiology Procedure:   Indications: Poor oral intake  Procedure: Gastrostomy tube placement  Findings: 20 Fr tube in stomach  Complications: No immediate complications noted.     EBL: Minimal  Plan: Anticipate starting feeds tomorrow   [name] R. Lowella Dandy, MD  Pager: [phone]

## 2021-02-14 NOTE — Progress Notes (Signed)
[facility] PROGRESS NOTE  Patient: [name] [id number]:[id number]   PCP: Mirna Mires, MD DOB: 17-Feb-1966   DOA: 01/23/2021   DOS: 02/14/2021     Patient remains drowsy postprocedure. Cardiology holding Haldol. I will also remove restraints. Abdominal binder for PEG tube protection. Discontinue Lantus. May require IV D5 LR and remains n.p.o. Currently hold on using tube feeding.  Author: Lynden Oxford, MD [facility] 02/14/2021 7:38 PM   If 7PM-7AM, please contact night-coverage at [email or web address]

## 2021-02-14 NOTE — TOC Progression Note (Signed)
Transition of Care [facility]) - Progression Note    Patient Details  Name: [name] MRN: [id number] Date of Birth: [birth year]  [facility]) CM/SW Contact  Marissa Bridgeman, RN Phone Number: 02/14/2021, 1:47 PM  Clinical Narrative:    Case management sent the patient's clinicals to [facility] to be evaluated for Inpatient placement for mental health services.  The patient is receiving her PEG placement today. CM will call the speak with the patient's sister, [name] on the phone regarding referral to [facility] inpatient behavioral health unit.   Expected Discharge Plan: Skilled Nursing Facility Barriers to Discharge: Continued Medical Work up, Requiring sitter/restraints  Expected Discharge Plan and Services Expected Discharge Plan: Skilled Nursing Facility In-house Referral: Clinical Social Work Discharge Planning Services: CM Consult Post Acute Care Choice: Skilled Nursing Facility Living arrangements for the past 2 months: Single Family Home                                       Social Determinants of Health (SDOH) Interventions    Readmission Risk Interventions No flowsheet data found.

## 2021-02-15 ENCOUNTER — Inpatient Hospital Stay (HOSPITAL_COMMUNITY): Payer: Medicaid Other

## 2021-02-15 DIAGNOSIS — G9340 Encephalopathy, unspecified: Secondary | ICD-10-CM | POA: Diagnosis not present

## 2021-02-15 DIAGNOSIS — R609 Edema, unspecified: Secondary | ICD-10-CM

## 2021-02-15 DIAGNOSIS — E876 Hypokalemia: Secondary | ICD-10-CM | POA: Diagnosis not present

## 2021-02-15 LAB — URINALYSIS, COMPLETE (UACMP) WITH MICROSCOPIC
Bacteria, UA: NONE SEEN
Bilirubin Urine: NEGATIVE
Glucose, UA: NEGATIVE mg/dL
Hgb urine dipstick: NEGATIVE
Ketones, ur: 5 mg/dL — AB
Leukocytes,Ua: NEGATIVE
Nitrite: NEGATIVE
Protein, ur: NEGATIVE mg/dL
Specific Gravity, Urine: 1.021 (ref 1.005–1.030)
pH: 6 (ref 5.0–8.0)

## 2021-02-15 LAB — GLUCOSE, CAPILLARY
Glucose-Capillary: 109 mg/dL — ABNORMAL HIGH (ref 70–99)
Glucose-Capillary: 117 mg/dL — ABNORMAL HIGH (ref 70–99)
Glucose-Capillary: 118 mg/dL — ABNORMAL HIGH (ref 70–99)
Glucose-Capillary: 121 mg/dL — ABNORMAL HIGH (ref 70–99)
Glucose-Capillary: 124 mg/dL — ABNORMAL HIGH (ref 70–99)
Glucose-Capillary: 125 mg/dL — ABNORMAL HIGH (ref 70–99)
Glucose-Capillary: 129 mg/dL — ABNORMAL HIGH (ref 70–99)
Glucose-Capillary: 131 mg/dL — ABNORMAL HIGH (ref 70–99)

## 2021-02-15 LAB — COMPREHENSIVE METABOLIC PANEL
ALT: 39 U/L (ref 0–44)
AST: 48 U/L — ABNORMAL HIGH (ref 15–41)
Albumin: 3.4 g/dL — ABNORMAL LOW (ref 3.5–5.0)
Alkaline Phosphatase: 74 U/L (ref 38–126)
Anion gap: 8 (ref 5–15)
BUN: 14 mg/dL (ref 6–20)
CO2: 26 mmol/L (ref 22–32)
Calcium: 9.7 mg/dL (ref 8.9–10.3)
Chloride: 105 mmol/L (ref 98–111)
Creatinine, Ser: 0.74 mg/dL (ref 0.44–1.00)
GFR, Estimated: >60 mL/min (ref >60)
Glucose, Bld: 124 mg/dL — ABNORMAL HIGH (ref 70–99)
Potassium: 4.1 mmol/L (ref 3.5–5.1)
Sodium: 139 mmol/L (ref 135–145)
Total Bilirubin: 1.2 mg/dL (ref 0.3–1.2)
Total Protein: 7.5 g/dL (ref 6.5–8.1)

## 2021-02-15 LAB — CBC WITH DIFFERENTIAL/PLATELET
Abs Immature Granulocytes: 0.06 10*3/uL (ref 0.00–0.07)
Basophils Absolute: 0.1 10*3/uL (ref 0.0–0.1)
Basophils Relative: 1 %
Eosinophils Absolute: 0 10*3/uL (ref 0.0–0.5)
Eosinophils Relative: 0 %
HCT: 39 % (ref 36.0–46.0)
Hemoglobin: 12.6 g/dL (ref 12.0–15.0)
Immature Granulocytes: 0 %
Lymphocytes Relative: 32 %
Lymphs Abs: 4.6 10*3/uL — ABNORMAL HIGH (ref 0.7–4.0)
MCH: 29.9 pg (ref 26.0–34.0)
MCHC: 32.3 g/dL (ref 30.0–36.0)
MCV: 92.6 fL (ref 80.0–100.0)
Monocytes Absolute: 1.1 10*3/uL — ABNORMAL HIGH (ref 0.1–1.0)
Monocytes Relative: 7 %
Neutro Abs: 8.7 10*3/uL — ABNORMAL HIGH (ref 1.7–7.7)
Neutrophils Relative %: 60 %
Platelets: 420 10*3/uL — ABNORMAL HIGH (ref 150–400)
RBC: 4.21 MIL/uL (ref 3.87–5.11)
RDW: 12.5 % (ref 11.5–15.5)
WBC: 14.5 10*3/uL — ABNORMAL HIGH (ref 4.0–10.5)
nRBC: 0 % (ref 0.0–0.2)

## 2021-02-15 LAB — BLOOD GAS, VENOUS
Acid-Base Excess: 4.9 mmol/L — ABNORMAL HIGH (ref 0.0–2.0)
Bicarbonate: 29.5 mmol/L — ABNORMAL HIGH (ref 20.0–28.0)
Drawn by: 5810
FIO2: 21
O2 Saturation: 74.1 %
Patient temperature: 37.5
pCO2, Ven: 49.2 mmHg (ref 44.0–60.0)
pH, Ven: 7.398 (ref 7.250–7.430)
pO2, Ven: 42.6 mmHg (ref 32.0–45.0)

## 2021-02-15 LAB — T4, FREE: Free T4: 0.97 ng/dL (ref 0.61–1.12)

## 2021-02-15 LAB — TSH: TSH: 3.149 u[IU]/mL (ref 0.350–4.500)

## 2021-02-15 LAB — VITAMIN B12: Vitamin B-12: 1358 pg/mL — ABNORMAL HIGH (ref 180–914)

## 2021-02-15 LAB — PHOSPHORUS: Phosphorus: 3.7 mg/dL (ref 2.5–4.6)

## 2021-02-15 LAB — MAGNESIUM: Magnesium: 2.2 mg/dL (ref 1.7–2.4)

## 2021-02-15 MED ORDER — OSMOLITE 1.2 CAL PO LIQD: 1000.0000 mL | ORAL | Status: DC

## 2021-02-15 MED ORDER — INSULIN ASPART 100 UNIT/ML IJ SOLN
0.0000 [IU] | INTRAMUSCULAR | Status: DC
  Administered 2021-02-15 (×3): 2 [IU] via SUBCUTANEOUS
  Administered 2021-02-16 – 2021-02-17 (×10): 3 [IU] via SUBCUTANEOUS
  Administered 2021-02-17: 5 [IU] via SUBCUTANEOUS
  Administered 2021-02-18: 8 [IU] via SUBCUTANEOUS
  Administered 2021-02-18: 3 [IU] via SUBCUTANEOUS
  Administered 2021-02-18: 5 [IU] via SUBCUTANEOUS
  Administered 2021-02-18: 3 [IU] via SUBCUTANEOUS
  Administered 2021-02-18: 2 [IU] via SUBCUTANEOUS
  Administered 2021-02-18: 3 [IU] via SUBCUTANEOUS
  Administered 2021-02-19: 5 [IU] via SUBCUTANEOUS
  Administered 2021-02-19 (×2): 3 [IU] via SUBCUTANEOUS
  Administered 2021-02-19: 5 [IU] via SUBCUTANEOUS
  Administered 2021-02-19 – 2021-02-20 (×4): 3 [IU] via SUBCUTANEOUS
  Administered 2021-02-20: 5 [IU] via SUBCUTANEOUS
  Administered 2021-02-20 (×2): 3 [IU] via SUBCUTANEOUS
  Administered 2021-02-21: 5 [IU] via SUBCUTANEOUS
  Administered 2021-02-21: 3 [IU] via SUBCUTANEOUS
  Administered 2021-02-21: 5 [IU] via SUBCUTANEOUS
  Administered 2021-02-21: 2 [IU] via SUBCUTANEOUS
  Administered 2021-02-21: 5 [IU] via SUBCUTANEOUS
  Administered 2021-02-21: 3 [IU] via SUBCUTANEOUS
  Administered 2021-02-22: 2 [IU] via SUBCUTANEOUS
  Administered 2021-02-22: 5 [IU] via SUBCUTANEOUS
  Administered 2021-02-22: 3 [IU] via SUBCUTANEOUS
  Administered 2021-02-22 (×2): 2 [IU] via SUBCUTANEOUS
  Administered 2021-02-22 – 2021-02-23 (×2): 3 [IU] via SUBCUTANEOUS
  Administered 2021-02-23: 5 [IU] via SUBCUTANEOUS
  Administered 2021-02-23 (×3): 3 [IU] via SUBCUTANEOUS

## 2021-02-15 MED ORDER — HALOPERIDOL LACTATE 5 MG/ML IJ SOLN: 2.0000 mg | Freq: Four times a day (QID) | INTRAMUSCULAR | Status: DC | PRN

## 2021-02-15 MED ORDER — GLUCERNA 1.2 CAL PO LIQD
1000.0000 mL | ORAL | Status: DC
  Administered 2021-02-15 – 2021-02-17 (×7): 1000 mL
  Filled 2021-02-15 (×5): qty 1000

## 2021-02-15 NOTE — Progress Notes (Signed)
Referring Physician(s):  Dr Leodis Binet  Supervising Physician: Mir, Mauri Reading  Patient Status:  [facility] - In-pt  Chief Complaint:  Poor nutrition Developmental disorder  Subjective:  Percutaneous G tube placed in IR yesterday Resting Undergoing EEG at this time  Allergies: Penicillins, Sulfa antibiotics, and Tomato  Medications: Prior to Admission medications   Medication Sig Start Date End Date Taking? Authorizing Provider  furosemide (LASIX) 20 MG tablet Take 20 mg by mouth daily.   Yes [provider]  glimepiride (AMARYL) 2 MG tablet Take 2 mg by mouth daily before breakfast.   Yes [provider]  insulin aspart (NOVOLOG) 100 UNIT/ML injection Inject 1-7 Units into the skin 3 (three) times daily as needed for high blood sugar (Per sliding scale: 120-150=1 unit, 150-200=2 units, 201-250=4 units, 251-300=6 units).   Yes [provider]  insulin glargine (LANTUS) 100 unit/mL SOPN Inject 40 Units into the skin at bedtime.   Yes [provider]  loratadine (CLARITIN) 10 MG tablet Take 10 mg by mouth daily.   Yes [provider]  metFORMIN (GLUCOPHAGE) 500 MG tablet Take 500 mg by mouth 2 (two) times daily with a meal.   Yes [provider]  potassium chloride (KLOR-CON) 10 MEQ tablet Take 10 mEq by mouth daily.   Yes [provider]  risperiDONE (RISPERDAL) 2 MG tablet Take 1 tablet (2 mg total) by mouth at bedtime. 11/03/20 01/24/21 Yes Jerald Kief, MD  traZODone (DESYREL) 100 MG tablet Take 1 tablet (100 mg total) by mouth at bedtime as needed for sleep. 11/03/20 01/24/21 Yes Jerald Kief, MD  valACYclovir (VALTREX) 500 MG tablet Take 500 mg by mouth at bedtime.   Yes [provider]  levETIRAcetam (KEPPRA) 1000 MG tablet Take 1 tablet (1,000 mg total) by mouth 2 (two) times daily. 11/03/20 12/03/20  Jerald Kief, MD  simvastatin (ZOCOR) 20 MG tablet Take 1 tablet (20 mg total) by mouth at bedtime. Patient  not taking: Reported on 01/24/2021 11/03/20 01/24/21  Jerald Kief, MD     Vital Signs: BP (!) 142/81 (BP Location: Left Arm)   Pulse 97   Temp 98.9 F (37.2 C) (Axillary)   Resp 16   Wt 156 lb 8.4 oz (71 kg)   SpO2 100%   BMI 29.58 kg/m   Physical Exam Abdominal:     General: Bowel sounds are decreased.  Skin:    General: Skin is warm.     Comments: Site is clean and dry NT no bleeding No hematoma     Imaging: IR GASTROSTOMY TUBE MOD SED  Result Date: 02/14/2021 INDICATION: 55 year old with poor oral intake. Percutaneous gastrostomy tube needed for nutrition. EXAM: PERCUTANEOUS GASTROSTOMY TUBE WITH FLUOROSCOPIC GUIDANCE Physician: Rachelle Hora. Henn, MD MEDICATIONS: Vancomycin 1 g; Antibiotics were administered within 1 hour of the procedure. Glucagon 1 mg ANESTHESIA/SEDATION: Versed 1.0 mg IV; Fentanyl 50 mcg IV Moderate Sedation Time:  22 minutes The patient was continuously monitored during the procedure by the interventional radiology nurse under my direct supervision. FLUOROSCOPY TIME:  Fluoroscopy Time: 11 minutes, 36 seconds, 50.3 mGy COMPLICATIONS: None immediate. PROCEDURE: Informed consent was obtained for a percutaneous gastrostomy tube. The patient was placed on the interventional table. An orogastric tube was placed with fluoroscopic guidance. The anterior abdomen was prepped and draped in sterile fashion. Maximal barrier sterile technique was utilized including caps, mask, sterile gowns, sterile gloves, sterile drape, hand hygiene and skin antiseptic. Stomach was inflated with air through the  orogastric tube. The skin and subcutaneous tissues were anesthetized with 1% lidocaine. A 17 gauge needle was directed into the distended stomach with fluoroscopic guidance. A wire was advanced into the stomach and a T fastener was deployed. A 9-French vascular sheath was placed and the orogastric tube was snared using a Gooseneck snare device. The orogastric tube and snare were pulled out  of the patient's mouth. The snare device was connected to a 20-French gastrostomy tube. The snare device and gastrostomy tube were pulled through the patient's mouth and out the anterior abdominal wall. The gastrostomy tube was cut to an appropriate length. Contrast injection through gastrostomy tube confirmed placement within the stomach. Fluoroscopic images were obtained for documentation. The gastrostomy tube was flushed with normal saline. IMPRESSION: Successful fluoroscopic guided percutaneous gastrostomy tube placement. Electronically Signed   By: Richarda Overlie M.D.   On: 02/14/2021 10:23    Labs:  CBC: Recent Labs    01/25/21 0438 01/26/21 0318 01/27/21 0151 02/15/21 0139  WBC 7.1 7.3 7.2 14.5*  HGB 13.0 12.3 12.5 12.6  HCT 39.2 37.3 37.4 39.0  PLT 370 360 338 420*    COAGS: Recent Labs    10/28/20 2013 10/31/20 0725 11/01/20 0934 01/23/21 1905  INR 0.9 1.0 0.9 0.9  APTT 26  --   --  29    BMP: Recent Labs    02/01/21 0015 02/02/21 0110 02/03/21 0304 02/15/21 0139  NA 140 137 136 139  K 3.4* 4.2 4.5 4.1  CL 104 104 98 105  CO2 26 25 28 26   GLUCOSE 206* 310* 249* 124*  BUN <5* 5* 10 14  CALCIUM 9.8 9.6 9.8 9.7  CREATININE 0.67 0.67 0.72 0.74  GFRNONAA >60 >60 >60 >60    LIVER FUNCTION TESTS: Recent Labs    11/02/20 0350 11/03/20 0155 01/23/21 1905 02/15/21 0139  BILITOT 0.8 0.3 0.9 1.2  AST 46* 39 39 48*  ALT 46* 40 28 39  ALKPHOS 60 60 75 74  PROT 6.4* 6.3* 8.1 7.5  ALBUMIN 3.1* 3.1* 3.9 3.4*    Assessment and Plan:  G tube in place May use now  Electronically Signed: June, PA-C 02/15/2021, 10:43 AM   I spent a total of 15 Minutes at the the patient's bedside AND on the patient's hospital floor or unit, greater than 50% of which was counseling/coordinating care for perc G tube

## 2021-02-15 NOTE — Progress Notes (Signed)
EEG Completed; Results Pending  

## 2021-02-15 NOTE — Progress Notes (Signed)
[facility] Progress Note  Patient: [name]    VPX:[id number]  DOA: 01/23/2021     Date of Service: the patient was seen and examined on 02/15/2021  Brief hospital course: The patient was admitted for neurological evaluation of suspected encephalopathy.  However, brain imaging was normal, EEG unremarkable, UA negative, urine drug screen negative, procalcitonin low, B12, ammonia, and TSH normal.  Acute metabolic encephalopathy was ruled out.  In the meantime psychiatry were consulted, who noted a temporal relationship between the patient moving from her structured living setting (group home?)  To live with her sister, and the subsequent presentation of her fixed delusions about eating and then cessation of oral intake.    Their primary recommendation is to help return the patient to a structured environment, either by creating structure at home or placing in a structured group home or nursing facility.  Throughout this, her management has been complicated by persistent fixed delusions and continued refusal to eat.  An NG tube was placed for enteral nutrition.  As she has made no progress to date in accepting oral intake, Psychiatry recommended PEG and Ethics were consulted.     As stated previously, "alternative route for fluids and nutrition must be sought.  The patient would have long term medical harm (likely starvation and death) if we did nothing.  It is my medical opinion that enteral nutrition via PEG is safer than TPN, IV fluids, or prolonged NG tube, and her most beneficent therapy.  Her POA agrees.  We will make all reasonable efforts to provide optimal management of her potential PEG and tube feeds, and are working towards disposition to a monitored setting with SW.  Given that the patient may not assent to PEG, an important mitigation of risk will be to use a binder to discourage manipulation of the PEG, choice of PEG hardware that causes minimal harm if removed by the patient, and  placement in a monitored setting."   Currently plan is monitor for improvement in mentation.  Subjective: Drowsy and sleepy postprocedure.  No nausea no vomiting.  Generally agitated and communicative.  Mentation progressively worsening over last 2 days.  Assessment and Plan: 1.  Intellectual/developmental delay with fixed delusion Psychiatry was consulted. Was on Haldol and benztropine.  Continuing benztropine for now.  2.  Acute encephalopathy Likely toxic secondary to Keppra. CT scan ordered. EEG unremarkable. VBG unremarkable. UA unremarkable. Chest x-ray negative for any pneumonia. Monitor.  3.  Sinus tachycardia. Etiology not clear but As mentioned above work-up so far is unremarkable for any acute infection. Upper extremity Doppler negative for DVT. No evidence of PE. As needed Lopressor.  4.  Seizure disorder Currently no seizures.  EEG negative for acute seizure. On IV Keppra 1 g while the tube feeds were on hold. Transitioning to per tube again. Will discuss with neurology if remains encephalopathic to reduce the dose of the Keppra.  5.  Type 2 diabetes mellitus uncontrolled with hypoglycemia Currently on sliding scale insulin. Was on Lantus 40 units prior to admission.  On hold as the patient is gradually starting to foods again.  6.  Hypokalemia, hypomagnesemia,  Corrected.  Scheduled Meds:  benztropine  1 mg Per Tube Daily   enoxaparin (LOVENOX) injection  40 mg Subcutaneous Q24H   free water  100 mL Per Tube Q4H   insulin aspart  0-15 Units Subcutaneous Q4H   levETIRAcetam  1,000 mg Per Tube BID   multivitamin with minerals  1 tablet Per Tube Daily  valACYclovir  500 mg Per Tube QHS   Continuous Infusions:  feeding supplement (GLUCERNA 1.2 CAL) 30 mL/hr at 02/15/21 1532   PRN Meds: acetaminophen **OR** acetaminophen, dextromethorphan-guaiFENesin, haloperidol lactate, hydrALAZINE, ipratropium-albuterol, metoprolol tartrate, senna-docusate  Body mass  index is 29.58 kg/m.  Nutrition Problem: Inadequate oral intake Etiology: lethargy/confusion     DVT Prophylaxis:   enoxaparin (LOVENOX) injection 40 mg Start: 01/24/21 1000    Advance goals of care discussion: Pt is Full code.  Family Communication: no family was present at bedside, at the time of interview.   Data Reviewed: I have personally reviewed and interpreted daily labs, tele strips, imaging. Mild leukocytosis.  Otherwise unremarkable labs.  Physical Exam:  General: Appear in mild distress, no Rash; Oral Mucosa Clear, moist. no Abnormal Neck Mass Or lumps, Conjunctiva normal  Cardiovascular: S1 and S2 Present, no Murmur, Respiratory: good respiratory effort, Bilateral Air entry present and CTA, no Crackles, no wheezes Abdomen: Bowel Sound present, Soft and no tenderness Extremities: no Pedal edema Neurology: lethargic and not oriented to time, place, and person affect flat in affect. no new focal deficit Gait not checked due to patient safety concerns  Vitals:   02/15/21 0500 02/15/21 0759 02/15/21 1152 02/15/21 1545  BP:  (!) 142/81 127/82 139/67  Pulse:  97 87 79  Resp:  16 18 16   Temp:  98.9 F (37.2 C) 98.7 F (37.1 C) 98.6 F (37 C)  TempSrc:  Axillary Axillary Axillary  SpO2:  100% 99% 99%  Weight: 71 kg       Disposition:  Status is: Inpatient  Remains inpatient appropriate because:IV treatments appropriate due to intensity of illness or inability to take PO and Inpatient level of care appropriate due to severity of illness  Dispo: The patient is from: Home              Anticipated d/c is to: SNF              Patient currently is not medically stable to d/c.   Difficult to place patient No  Time spent: 35 minutes. I reviewed all nursing notes, pharmacy notes, vitals, pertinent old records. I have discussed plan of care as described above with RN.  Author: , MD [facility] 02/15/2021 7:23 PM  To reach On-call, see care teams  to locate the attending and reach out via [email or web address]/17/2021. Between 7PM-7AM, please contact night-coverage If you still have difficulty reaching the attending provider, please page the [facility] (Director on Call) for [facility] on amion for assistance.

## 2021-02-15 NOTE — Progress Notes (Signed)
Provider canceled order for ABG, respiratory informed.

## 2021-02-15 NOTE — Progress Notes (Signed)
Upper extremity venous has been completed.   Preliminary results in CV Proc.   Roxanne Orner Shawonda Kerce 02/15/2021 1:22 PM

## 2021-02-15 NOTE — Procedures (Addendum)
Patient Name: [name]  MRN: [id number]  Epilepsy Attending: Charlsie Quest  Referring Physician/Provider: Dr Lynden Oxford Date: 02/15/2021 Duration: 23 mins  Patient history:  55 year old female with altered mental status.  EEG to evaluate for seizures.   Level of alertness: Awake, asleep   AEDs during EEG study: LEV   Technical aspects: This EEG study was done with scalp electrodes positioned according to the 10-20 International system of electrode placement. Electrical activity was acquired at a sampling rate of 500Hz  and reviewed with a high frequency filter of 70Hz  and a low frequency filter of 1Hz . EEG data were recorded continuously and digitally stored.    Description: The posterior dominant rhythm consists of 9-10 Hz activity of moderate voltage (25-35 uV) seen predominantly in posterior head regions, symmetric and reactive to eye opening and eye closing. Sleep was characterized by vertex waves, sleep spindles (12-14hz ), maximal frontocentral region. Hyperventilation and photic stimulation were not performed.      IMPRESSION: This study is within normal limits. No seizures or epileptiform discharges were seen throughout the recording.   Arhum Peeples 

## 2021-02-15 NOTE — TOC Progression Note (Addendum)
Transition of Care [facility]) - Progression Note    Patient Details  Name: RASHIDAH BELLEVILLE MRN: [id number] Date of Birth: 1965/08/14  [facility]) CM/SW Contact  Janae Bridgeman, RN Phone Number: 02/15/2021, 12:36 PM  Clinical Narrative:    Case management called and made a referral to [facility] for a care coordinator since the patient currently does not have a coordinator.  I spoke with Ty, CM at [facility] on the phone and application was placed over the phone for a referral and psychiatry clinicals from Dr. Alfonse Flavors was faxed to Raymond Gurney to fax # [phone].  Follow up contacts can be made to Raymond Gurney at email [email or web address] or her office # [phone].  CM called and left a message and call back number at [facility] to follow up on [facility] is aware when referral was placed today.  02/15/2021 1445 - Patient's clinicals sent via hard fax to [facility] to [phone] since I have not received a return call from intake coordinator.   Expected Discharge Plan: Skilled Nursing Facility Barriers to Discharge: Continued Medical Work up, Requiring sitter/restraints  Expected Discharge Plan and Services Expected Discharge Plan: Skilled Nursing Facility In-house Referral: Clinical Social Work Discharge Planning Services: CM Consult Post Acute Care Choice: Skilled Nursing Facility Living arrangements for the past 2 months: Single Family Home                                       Social Determinants of Health (SDOH) Interventions    Readmission Risk Interventions No flowsheet data found.

## 2021-02-15 NOTE — Progress Notes (Signed)
OT Cancellation Note  Patient Details Name: [name] MRN: [id number] DOB: 1966/04/08   Cancelled Treatment:    Reason Eval/Treat Not Completed: Patient at procedure or test/ unavailable;Other (comment) (pt off unit for EEG, Will continue efforts as time allows.)  Lenor Derrick., COTA/L Acute Rehabilitation Services [phone]   Barron Schmid 02/15/2021, 10:07 AM

## 2021-02-15 NOTE — Progress Notes (Addendum)
PT Cancellation Note  Patient Details Name: [name] MRN: [id number] DOB: 01-May-1966   Cancelled Treatment:    Reason Eval/Treat Not Completed: (P) Patient at procedure or test/unavailable (pt off unit for EEG.) Re-attempt at 1500, per RN pt too lethargic to participate. Will continue efforts per PT POC as schedule permits.   Emmanuelle Coxe M Leathia Farnell 02/15/2021, 10:05 AM

## 2021-02-15 NOTE — Progress Notes (Signed)
Lab called nurse stating, arterial blood gas order was still pending to be drawn. Respiratory called and informed of this order. Respiratory, Elane Fritz stated venous blood gas was normal and Doctor needed to be asked if they still wanted ABG drawn. On call [facility] informed of this via page.Waiting for MD response.

## 2021-02-16 DIAGNOSIS — E876 Hypokalemia: Secondary | ICD-10-CM | POA: Diagnosis not present

## 2021-02-16 LAB — CBC WITH DIFFERENTIAL/PLATELET
Abs Immature Granulocytes: 0.03 10*3/uL (ref 0.00–0.07)
Basophils Absolute: 0.1 10*3/uL (ref 0.0–0.1)
Basophils Relative: 1 %
Eosinophils Absolute: 0.1 10*3/uL (ref 0.0–0.5)
Eosinophils Relative: 1 %
HCT: 37.1 % (ref 36.0–46.0)
Hemoglobin: 11.7 g/dL — ABNORMAL LOW (ref 12.0–15.0)
Immature Granulocytes: 0 %
Lymphocytes Relative: 36 %
Lymphs Abs: 3.7 10*3/uL (ref 0.7–4.0)
MCH: 29.5 pg (ref 26.0–34.0)
MCHC: 31.5 g/dL (ref 30.0–36.0)
MCV: 93.5 fL (ref 80.0–100.0)
Monocytes Absolute: 0.7 10*3/uL (ref 0.1–1.0)
Monocytes Relative: 7 %
Neutro Abs: 5.6 10*3/uL (ref 1.7–7.7)
Neutrophils Relative %: 55 %
Platelets: 395 10*3/uL (ref 150–400)
RBC: 3.97 MIL/uL (ref 3.87–5.11)
RDW: 12.5 % (ref 11.5–15.5)
WBC: 10.3 10*3/uL (ref 4.0–10.5)
nRBC: 0 % (ref 0.0–0.2)

## 2021-02-16 LAB — COMPREHENSIVE METABOLIC PANEL
ALT: 46 U/L — ABNORMAL HIGH (ref 0–44)
AST: 50 U/L — ABNORMAL HIGH (ref 15–41)
Albumin: 3.2 g/dL — ABNORMAL LOW (ref 3.5–5.0)
Alkaline Phosphatase: 71 U/L (ref 38–126)
Anion gap: 11 (ref 5–15)
BUN: 14 mg/dL (ref 6–20)
CO2: 25 mmol/L (ref 22–32)
Calcium: 9.6 mg/dL (ref 8.9–10.3)
Chloride: 103 mmol/L (ref 98–111)
Creatinine, Ser: 0.66 mg/dL (ref 0.44–1.00)
GFR, Estimated: >60 mL/min (ref >60)
Glucose, Bld: 167 mg/dL — ABNORMAL HIGH (ref 70–99)
Potassium: 4 mmol/L (ref 3.5–5.1)
Sodium: 139 mmol/L (ref 135–145)
Total Bilirubin: 0.8 mg/dL (ref 0.3–1.2)
Total Protein: 7.1 g/dL (ref 6.5–8.1)

## 2021-02-16 LAB — GLUCOSE, CAPILLARY
Glucose-Capillary: 151 mg/dL — ABNORMAL HIGH (ref 70–99)
Glucose-Capillary: 159 mg/dL — ABNORMAL HIGH (ref 70–99)
Glucose-Capillary: 162 mg/dL — ABNORMAL HIGH (ref 70–99)
Glucose-Capillary: 163 mg/dL — ABNORMAL HIGH (ref 70–99)
Glucose-Capillary: 171 mg/dL — ABNORMAL HIGH (ref 70–99)
Glucose-Capillary: 173 mg/dL — ABNORMAL HIGH (ref 70–99)
Glucose-Capillary: 186 mg/dL — ABNORMAL HIGH (ref 70–99)
Glucose-Capillary: 190 mg/dL — ABNORMAL HIGH (ref 70–99)

## 2021-02-16 LAB — MAGNESIUM: Magnesium: 2.2 mg/dL (ref 1.7–2.4)

## 2021-02-16 LAB — PHOSPHORUS: Phosphorus: 3.5 mg/dL (ref 2.5–4.6)

## 2021-02-16 MED ORDER — LEVETIRACETAM 100 MG/ML PO SOLN
750.0000 mg | Freq: Two times a day (BID) | ORAL | Status: DC
  Administered 2021-02-17 – 2021-02-18 (×4): 750 mg
  Filled 2021-02-16 (×4): qty 10

## 2021-02-16 NOTE — Progress Notes (Signed)
Physical Therapy Treatment Patient Details Name: [name] MRN: [id number] DOB: January 04, 1966 Today's Date: 02/16/2021    History of Present Illness 55 y.o. female admitted for neurological evaluation of suspected encephalopathy.  However, brain imaging was normal, EEG unremarkable, UA negative, urine drug screen negative, procalcitonin low, B12, ammonia, and TSH normal.  Acute metabolic encephalopathy was ruled out. Progressive mental decline over previous 2 days so another EEG and CT head performed 8/30 with normal results, no sign of seizures. PMH: intellectual disability, bipolar disorder, seizure disorder, type II diabetes, hypertension, hyperlipidemia, immune deficiency disorder.    PT Comments    Pt received in supine, lethargic and with eyes closed initially but opening eyes once pt positioned upright at edge of bed. Pt not responding to simple 1-step commands and with decreased righting reactions seated EOB but after ~5 minutes pt progressed to min guard assist at times. Pt continues to need totalA for all mobility due to lethargy and actively resisting attempts to assist her with UE/LE ROM. Pt non-verbal throughout. Pt continues to benefit from PT services to progress toward functional mobility goals.    Follow Up Recommendations  SNF     Equipment Recommendations  None recommended by PT;Other (comment) (will continue to assess; currently would need mechanical lift and hospital bed)    Recommendations for Other Services       Precautions / Restrictions Precautions Precautions: Fall Precaution Comments: B hand mitts, G-tube, abdominal binder Restrictions Weight Bearing Restrictions: No    Mobility  Bed Mobility Overal bed mobility: Needs Assistance Bed Mobility: Rolling;Supine to Sit;Sit to Supine Rolling: Total assist;+2 for physical assistance   Supine to sit: Total assist;+2 for physical assistance Sit to supine: Total assist;+2 for physical assistance   General  bed mobility comments: Rolling multiple reps to reposition, with hand over hand assist pt continues to not assist or reach for bed rail; pt resisting attempts to help her flex B shoulders to reach for rail or objects    Transfers                 General transfer comment: not safe.  Ambulation/Gait                 Stairs             Wheelchair Mobility    Modified Rankin (Stroke Patients Only)       Balance Overall balance assessment: Needs assistance Sitting-balance support: Feet supported Sitting balance-Leahy Scale: Zero Sitting balance - Comments: patient did support herself with min guard for short bouts, but generally leaning L and needs maxA and poor/delayed righting reflexes Postural control: Posterior lean;Left lateral lean                                  Cognition Arousal/Alertness: Lethargic Behavior During Therapy: Flat affect Overall Cognitive Status: No family/caregiver present to determine baseline cognitive functioning Area of Impairment: Attention;Following commands                 Orientation Level:  (pt not responding to any orientation questions) Current Attention Level: Focused   Following Commands: Follows one step commands inconsistently;Follows multi-step commands inconsistently     Problem Solving: Decreased initiation;Requires verbal cues;Requires tactile cues General Comments: Did not follow any commands during 8/31 session. Opened eyes once positioned sitting EOB but ignoring cues for UE/LE movements, at times actively resisting attempts for ROM/mobility and occasionally voluntarily moving  fingers/limbs but mostly lethargic/flat.      Exercises Other Exercises Other Exercises: attempted PROM/AAROM but pt resistant to attempts for heel slides, ankle pumps, or UE mobility    General Comments General comments (skin integrity, edema, etc.): VSS on RA; pt tolerated sitting EOB ~10 minutes, but unable to  indicate if she was dizzy and unsafe to leave room to grab machine to check orthostatics      Pertinent Vitals/Pain Pain Assessment: Faces Faces Pain Scale: No hurt Pain Intervention(s): Limited activity within patient's tolerance;Monitored during session;Repositioned     PT Goals (current goals can now be found in the care plan section) Acute Rehab PT Goals Patient Stated Goal: None stated PT Goal Formulation: Patient unable to participate in goal setting Time For Goal Achievement: 02/25/21 Progress towards PT goals: Not progressing toward goals - comment (lethargy limiting)    Frequency    Min 2X/week      PT Plan Current plan remains appropriate    Co-evaluation PT/OT/SLP Co-Evaluation/Treatment: Yes Reason for Co-Treatment: Complexity of the patient's impairments (multi-system involvement);Necessary to address cognition/behavior during functional activity;For patient/therapist safety;To address functional/ADL transfers PT goals addressed during session: Mobility/safety with mobility;Balance OT goals addressed during session: ADL's and self-care      AM-PAC PT "6 Clicks" Mobility   Outcome Measure  Help needed turning from your back to your side while in a flat bed without using bedrails?: Total Help needed moving from lying on your back to sitting on the side of a flat bed without using bedrails?: Total Help needed moving to and from a bed to a chair (including a wheelchair)?: Total Help needed standing up from a chair using your arms (e.g., wheelchair or bedside chair)?: Total Help needed to walk in hospital room?: Total Help needed climbing 3-5 steps with a railing? : Total 6 Click Score: 6    End of Session   Activity Tolerance: Patient limited by lethargy Patient left: in bed;with call bell/phone within reach;with bed alarm set;Other (comment) (pt turned with pillow behind L side to offload and heels floated, G-tube restarted/attached and mitts  re-donned) Nurse Communication: Mobility status PT Visit Diagnosis: Other abnormalities of gait and mobility (R26.89);Other symptoms and signs involving the nervous system (R29.898)     Time: 5053-9767 PT Time Calculation (min) (ACUTE ONLY): 27 min  Charges:  $Therapeutic Activity: 8-22 mins                     Deaunna Olarte P., PTA Acute Rehabilitation Services Pager: [phone] Office: [phone]    Angus Palms 02/16/2021, 2:28 PM

## 2021-02-16 NOTE — TOC Progression Note (Addendum)
Transition of Care [facility]) - Progression Note    Patient Details  Name: [name] MRN: [id number] Date of Birth: 11/13/1965  [facility] CM/SW Contact  Janae Bridgeman, RN Phone Number: 02/16/2021, 11:23 AM  Clinical Narrative:    CM called [facility]) at [phone] and left a call back number to follow up regarding the [facility] intake department to view the patient's clinicals sent on the hub for possible consideration for admission.  Clinicals were faxed by hard copy to [facility] at fax #[phone].  02/16/2021 1158 - I spoke with Annell Greening, CM at [facility] [phone] and the medical director is review the patient for appropriateness for admission to the med-psych unit at [facility].  The facility will call back if they need additional clinicals.  The patient has had a previous admission to [facility] years ago according to the intake CM.   Expected Discharge Plan: Skilled Nursing Facility Barriers to Discharge: Continued Medical Work up, Requiring sitter/restraints  Expected Discharge Plan and Services Expected Discharge Plan: Skilled Nursing Facility In-house Referral: Clinical Social Work Discharge Planning Services: CM Consult Post Acute Care Choice: Skilled Nursing Facility Living arrangements for the past 2 months: Single Family Home                                       Social Determinants of Health (SDOH) Interventions    Readmission Risk Interventions No flowsheet data found.

## 2021-02-16 NOTE — Progress Notes (Signed)
Nutrition Follow-up  DOCUMENTATION CODES:  Not applicable  INTERVENTION:  Continue TF via PEG: -Glucerna 1.2 @ 80m/hr (15670md)  -10093mree water Q4H  Provides 1872 kcals, 93 grams protein, 1255m45mee water (1855ml83mal free water)  NUTRITION DIAGNOSIS:  Inadequate oral intake related to lethargy/confusion as evidenced by meal completion < 25%, per patient/family report. --ongoing  GOAL:  Patient will meet greater than or equal to 90% of their needs -- met with TF  MONITOR:  TF tolerance, Weight trends, Labs, I & O's  REASON FOR ASSESSMENT:  Consult Enteral/tube feeding initiation and management  ASSESSMENT:  Pt with PMH significant of intellectual disability, bipolar disorder, seizure disorder, type 2 DM, HTN, HLD, immunodeficiency disorder presented to the ED for change in mental status as reported by patient's sister for the past couple of days at home. CT head, MRI brain, EEG, and other neurologic work-up have been negative. Abdominal x-ray showed moderate stool burden. Pt does have h/o constipation and was recommended by GI to take Linzess and Dulcolax, though pt has been noncompliant. Pt has outpatient colonoscopy scheduled.  8/15 Cortrak placed (gastric tip) 8/29 PEG placed   Pt underwent PEG placement two days ago. Tolerating TF via PEG per RN. Current TF: Glucerna 1.2 @ 65ml/75mith 100ml f58mwater Q4H. Recommend continue current nutrition plan of care.   Medications: SSI, mvi with minerals Labs reviewed. Elevated LFTs.  CBGs [phone] [id number] x29murs I/O: +14.3L since admit  Admission weight: 71.6 kg Current weight: 73.3 kg  Non-pitting edema to LUE noted per RN edema assessment  Diet Order:   Diet Order             Diet NPO time specified  Diet effective now                  EDUCATION NEEDS:  Not appropriate for education at this time  Skin:  Skin Assessment: Reviewed RN Assessment  Last BM:  8/29  Height:  Ht Readings from  Last 1 Encounters:  10/30/20 '5\' 1"'  (1.549 m)   Weight:  Wt Readings from Last 1 Encounters:  02/16/21 73.3 kg   BMI:  Body mass index is 30.53 kg/m.  Estimated Nutritional Needs:  Kcal:  1800-2000 Protein:  90-100 grams Fluid:  >1.8L/d    Rc [place] ALarkin Ina, LDN (she/her/hers) RD pager number and weekend/on-call pager number located in [email or web address]

## 2021-02-16 NOTE — Consult Note (Signed)
Psychiatry C/L Progress Note   [name]  MRN:  [id number] Reason for Consult:  Flat affect, refusal to eat, and medication management Referring Physician:  Stephania Fragmin, MD Method of visit?: Face to Face    Principal Problem: Acute encephalopathy Diagnosis:  Principal Problem:   Acute encephalopathy Active Problems:   Hypoglycemia   Constipation   Bipolar disorder (HCC)   Diabetes (HCC)   AMS (altered mental status)   Poor appetite   Intellectual Disability   Psychogenic loss of appetite   Subjective: [name] is a 55 year old female admitted for evaluation of acute mental status change and consulted to the psychiatry service for potential medication management in light of flat affect and refusal to eat. This morning, she was sleeping soundly, snoring. She was minimally arousable to her name and shaking. She briefly opened her eyes and went back to sleep. Per primary team, this has been ongoing since her procedure.    Treatment Plan Summary: [name] has been drowsy since 8/29, so unable to assess properly for progress. Her symptoms are likely due to her intellectual disability, in which she is able to perform most tasks without or with minimal assistance and has some maladaptive behaviors. IR has approved tube feeds and medications per tube to begin.   IDD symptomology: Food refusal/ psychogenic loss of appetite  -Haldol currently held by primary team for increased lethargy. Once resumed, continue Haldol 2.5 mg per tube qAM and at lunchtime, and 5 mg qHS per tube.  Qtc 445.  -Continue Cogentin 1 mg per tube daily. -Remeron 7.5 mg discontinued by primary team. [name] confirmed that she would like for [name] to have placement for management of her G-tube. SW kindly assisting with placement.    Disposition: If accepted by [facility], she would be appropriate for services. If not, SNF placement upon discharge and outpatient psychiatric follow-up.  Psychiatry will continue to follow during this admission.   Lamar Sprinkles, MD PGY-1 02/16/2021 [facility] Department of Psychiatry

## 2021-02-16 NOTE — Plan of Care (Signed)
  Problem: Clinical Measurements: Goal: Will remain free from infection Outcome: Progressing Goal: Diagnostic test results will improve Outcome: Progressing Goal: Respiratory complications will improve Outcome: Progressing Goal: Cardiovascular complication will be avoided Outcome: Progressing   Problem: Nutrition: Goal: Adequate nutrition will be maintained Outcome: Progressing   Problem: Coping: Goal: Level of anxiety will decrease Outcome: Progressing   Problem: Elimination: Goal: Will not experience complications related to bowel motility Outcome: Progressing Goal: Will not experience complications related to urinary retention Outcome: Progressing   Problem: Pain Managment: Goal: General experience of comfort will improve Outcome: Progressing   Problem: Safety: Goal: Ability to remain free from injury will improve Outcome: Progressing   Problem: Skin Integrity: Goal: Risk for impaired skin integrity will decrease Outcome: Progressing   Problem: Medication: Goal: Risk for medication side effects will decrease Outcome: Progressing   Problem: Clinical Measurements: Goal: Diagnostic test results will improve Outcome: Progressing   Problem: Safety: Goal: Verbalization of understanding the information provided will improve Outcome: Progressing   Problem: Self-Concept: Goal: Level of anxiety will decrease Outcome: Progressing

## 2021-02-16 NOTE — Progress Notes (Signed)
[facility] PROGRESS NOTE  GAYLYNN SEIPLE VWU:[id number] DOB: May 18, 1966 DOA: 01/23/2021 PCP: Mirna Mires, MD  Status: Remains inpatient appropriate because:Altered mental status, Unsafe d/c plan, IV treatments appropriate due to intensity of illness or inability to take PO, and Inpatient level of care appropriate due to severity of illness  Dispo: The patient is from: Home              Anticipated d/c is to: SNF: [facility] that can accommodate both IDD needs and PEG tube              Patient currently is not medically stable to d/c.   Difficult to place patient Yes   Level of care: Med-Surg  Code Status: Full Family Communication:  DVT prophylaxis: Lovenox COVID vaccination status: Unknown   HPI: 55 y.o. F with DM, HTN, developmental delay who presented with decreased oral intake and "saying bizarre things".   In the ER, CT head unremarkable.  She was hypoglycemic and otherwise had normal chemistries and hemogram.  Admitted for evaluation of erratic behavior.  Subjective: Patient calmly laying in bed with eyes closed.  Will not open eyes to stimulation or voice.  When I attempted to open the patient's eyes for pupillary examination she firmly resisted me opening either eye and forced her eyes to remain closed.  Objective: Vitals:   02/16/21 0403 02/16/21 0531  BP: 127/64 127/70  Pulse: 81 74  Resp: 15 15  Temp: 98.3 F (36.8 C) 98.5 F (36.9 C)  SpO2: 97% 98%    Intake/Output Summary (Last 24 hours) at 02/16/2021 0814 Last data filed at 02/16/2021 0500 Gross per 24 hour  Intake 1217.33 ml  Output 1025 ml  Net 192.33 ml   Filed Weights   02/11/21 0409 02/15/21 0500 02/16/21 0500  Weight: 71.9 kg 71 kg 73.3 kg    Exam:  Constitutional: NAD, calm, comfortable Respiratory: clear to auscultation bilaterally, no wheezing, no crackles. Normal respiratory effort. RA Cardiovascular: Regular rate and rhythm, S1S2, No extremity edema. 2+ pedal pulses. No  carotid bruits.  Abdomen: PEG tube placed 8/29. LBM 8/29 Neurologic: Difficult to assess given patient's refusal to participate.  Purposefully kept eyelids shut with attempts to examine pupils.  Prior to PEG tube placement patient had spontaneous movement of extremities but currently is not following commands Psychiatric: Appears to be drowsy but patient is also purposefully resisting opening of eyes for examination.  Currently is nonverbal.   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  Brain imaging unremarkable.  EEG unremarkable.  UA negative, urine drug screen negative, procalcitonin low.  B12, ammonia, TSH normal. Acute metabolic encephalopathy ruled out. Psychiatry consulted and suspected recent change in environment has precipitated development of patient's withdrawn state Recent issues with suspected altered mentation since placement of PEG tube-initially felt to be related to sedation from procedure.  Based on my exam this behavior seems to be purposeful.  EEG on 8/30 negative.  CT head negative.  As precaution attending has decreased dose of Keppra.  Levetiracetam level also pending stat.  Poor oral intake 2/2 fixed delusions/Malnutrition/Obesity PEG tube placed on 8/29  Ethics team consulted who saw no ethical barrier to placing PEG tube and initiating feedings noting PEG tube can be easily removed once patient starts eating again. Nutrition Problem: Inadequate oral intake Etiology: lethargy/confusion Signs/Symptoms: meal completion < 25%, per patient/family report Interventions: Tube feeding   Seizure disorder Has seizure disorder, no  seizures here Continue keppra with dose decreased on 8/31 due to altered mentation  Physical deconditioning secondary to fixed delusion Although I was able to get the patient to weakly lift both legs she has told providers that she  is unable to walk secondary to not having any legs PT and OT evaluation-although patient is known to be developmentally delayed there may be some benefit to having SLP and OT perform cognitive evaluations   Diabetes melitis 2 uncontrolled on long-term insulin CBGs remain elevated Long-acting insulin glargine placed on hold recently due to hypoglycemia.  Prior to ability to utilize recently placed PEG tube core track tube became clogged and patient unable to receive feedings. Continue SSI correction-current CBGs over the past 24 hours between 117 to 186  Hypokalemia Hypomagnesemia Resolved         Data Reviewed: Basic Metabolic Panel: Recent Labs  Lab 02/13/21 0809 02/15/21 0139 02/16/21 0322  NA  --  139 139  K  --  4.1 4.0  CL  --  105 103  CO2  --  26 25  GLUCOSE  --  124* 167*  BUN  --  14 14  CREATININE  --  0.74 0.66  CALCIUM  --  9.7 9.6  MG 2.3 2.2 2.2  PHOS 4.0 3.7 3.5   Liver Function Tests: Recent Labs  Lab 02/15/21 0139 02/16/21 0322  AST 48* 50*  ALT 39 46*  ALKPHOS 74 71  BILITOT 1.2 0.8  PROT 7.5 7.1  ALBUMIN 3.4* 3.2*   No results for input(s): LIPASE, AMYLASE in the last 168 hours. No results for input(s): AMMONIA in the last 168 hours. CBC: Recent Labs  Lab 02/15/21 0139 02/16/21 0322  WBC 14.5* 10.3  NEUTROABS 8.7* 5.6  HGB 12.6 11.7*  HCT 39.0 37.1  MCV 92.6 93.5  PLT 420* 395   Cardiac Enzymes: No results for input(s): CKTOTAL, CKMB, CKMBINDEX, TROPONINI in the last 168 hours. BNP (last 3 results) Recent Labs    11/01/20 0405 11/02/20 0350 11/03/20 0155  BNP 15.0 8.6 6.0    ProBNP (last 3 results) No results for input(s): PROBNP in the last 8760 hours.  CBG: Recent Labs  Lab 02/15/21 1712 02/15/21 2112 02/15/21 2356 02/16/21 0336 02/16/21 0522  GLUCAP 129* 124* 117* 163* 162*    No results found for this or any previous visit (from the past 240 hour(s)).   Studies: CT HEAD WO CONTRAST ( )  Result Date:  02/15/2021 CLINICAL DATA:  Encephalopathy EXAM: CT HEAD WITHOUT CONTRAST TECHNIQUE: Contiguous axial images were obtained from the base of the skull through the vertex without intravenous contrast. COMPARISON:  None. FINDINGS: Brain: There is no mass, hemorrhage or extra-axial collection. The size and configuration of the ventricles and extra-axial CSF spaces are normal. The brain parenchyma is normal, without acute or chronic infarction. Vascular: No abnormal hyperdensity of the major intracranial arteries or dural venous sinuses. No intracranial atherosclerosis. Skull: The visualized skull base, calvarium and extracranial soft tissues are normal. Sinuses/Orbits: No fluid levels or advanced mucosal thickening of the visualized paranasal sinuses. No mastoid or middle ear effusion. The orbits are normal. IMPRESSION: Normal head CT. Electronically Signed   By: Deatra Robinson M.D.   On: 02/15/2021 23:50   IR GASTROSTOMY TUBE MOD SED  Result Date: 02/14/2021 INDICATION: 55 year old with poor oral intake. Percutaneous gastrostomy tube needed for nutrition. EXAM: PERCUTANEOUS GASTROSTOMY TUBE WITH FLUOROSCOPIC GUIDANCE Physician: Rachelle Hora. Henn, MD MEDICATIONS: Vancomycin 1 g; Antibiotics were administered within 1 hour  of the procedure. Glucagon 1 mg ANESTHESIA/SEDATION: Versed 1.0 mg IV; Fentanyl 50 mcg IV Moderate Sedation Time:  22 minutes The patient was continuously monitored during the procedure by the interventional radiology nurse under my direct supervision. FLUOROSCOPY TIME:  Fluoroscopy Time: 11 minutes, 36 seconds, 50.3 mGy COMPLICATIONS: None immediate. PROCEDURE: Informed consent was obtained for a percutaneous gastrostomy tube. The patient was placed on the interventional table. An orogastric tube was placed with fluoroscopic guidance. The anterior abdomen was prepped and draped in sterile fashion. Maximal barrier sterile technique was utilized including caps, mask, sterile gowns, sterile gloves, sterile  drape, hand hygiene and skin antiseptic. Stomach was inflated with air through the orogastric tube. The skin and subcutaneous tissues were anesthetized with 1% lidocaine. A 17 gauge needle was directed into the distended stomach with fluoroscopic guidance. A wire was advanced into the stomach and a T fastener was deployed. A 9-French vascular sheath was placed and the orogastric tube was snared using a [employer] snare device. The orogastric tube and snare were pulled out of the patient's mouth. The snare device was connected to a 20-French gastrostomy tube. The snare device and gastrostomy tube were pulled through the patient's mouth and out the anterior abdominal wall. The gastrostomy tube was cut to an appropriate length. Contrast injection through gastrostomy tube confirmed placement within the stomach. Fluoroscopic images were obtained for documentation. The gastrostomy tube was flushed with normal saline. IMPRESSION: Successful fluoroscopic guided percutaneous gastrostomy tube placement. Electronically Signed   By: Richarda Overlie M.D.   On: 02/14/2021 10:23   DG CHEST PORT 1 VIEW  Result Date: 02/15/2021 CLINICAL DATA:  Wheezing EXAM: PORTABLE CHEST 1 VIEW COMPARISON:  Portable exam 0835 hours compared to 01/24/2021 FINDINGS: Enlargement of cardiac silhouette. Mediastinal contours and pulmonary vascularity normal. Lungs clear. No infiltrate, pleural effusion, or pneumothorax. Osseous structures unremarkable. IMPRESSION: Mild enlargement of cardiac silhouette. No acute abnormalities. Electronically Signed   By: Ulyses Southward M.D.   On: 02/15/2021 10:42   EEG adult  Result Date: 02/15/2021 Charlsie Quest, MD     02/15/2021 11:26 AM Patient Name: CAIA LOFARO MRN: [id number] Epilepsy Attending: Charlsie Quest Referring Physician/Provider: Dr Lynden Oxford Date: 02/15/2021 Duration: 23 mins Patient history:  55 year old female with altered mental status.  EEG to evaluate for seizures.  Level of alertness:  Awake, asleep  AEDs during EEG study: LEV  Technical aspects: This EEG study was done with scalp electrodes positioned according to the 10-20 International system of electrode placement. Electrical activity was acquired at a sampling rate of 500Hz  and reviewed with a high frequency filter of 70Hz  and a low frequency filter of 1Hz . EEG data were recorded continuously and digitally stored.  Description: The posterior dominant rhythm consists of 9-10 Hz activity of moderate voltage (25-35 uV) seen predominantly in posterior head regions, symmetric and reactive to eye opening and eye closing. Sleep was characterized by vertex waves, sleep spindles (12-14hz ), maximal frontocentral region. Hyperventilation and photic stimulation were not performed.    IMPRESSION: This study is within normal limits. No seizures or epileptiform discharges were seen throughout the recording.  Priyanka O Yadav   VAS UPPER EXTREMITY VENOUS DUPLEX  Result Date: 02/15/2021 UPPER VENOUS STUDY  Patient Name:  BOBIE CARIS  Date of Exam:   02/15/2021 Medical Rec #: 02/17/2021        Accession #:    [name] Date of Birth: [birth year]        Patient Gender: F Patient Age:  55 years Exam Location:  [facility] Procedure:      VAS [place] UPPER EXTREMITY VENOUS DUPLEX Referring Phys: PRANAV PATEL --------------------------------------------------------------------------------  Indications: Edema Comparison Study: no prior Performing Technologist: [place] Ponder RVS  Examination Guidelines: A complete evaluation includes B-mode imaging, spectral Doppler, color Doppler, and power Doppler as needed of all accessible portions of each vessel. Bilateral testing is considered an integral part of a complete examination. Limited examinations for reoccurring indications may be performed as noted.  Right Findings: +----------+------------+---------+-----------+----------+-------+ RIGHT     CompressiblePhasicitySpontaneousPropertiesSummary  +----------+------------+---------+-----------+----------+-------+ Subclavian    Full       Yes       Yes                      +----------+------------+---------+-----------+----------+-------+  Left Findings: +----------+------------+---------+-----------+----------+-------+ LEFT      CompressiblePhasicitySpontaneousPropertiesSummary +----------+------------+---------+-----------+----------+-------+ IJV           Full       Yes       Yes                      +----------+------------+---------+-----------+----------+-------+ Subclavian    Full       Yes       Yes                      +----------+------------+---------+-----------+----------+-------+ Axillary      Full       Yes       Yes                      +----------+------------+---------+-----------+----------+-------+ Brachial      Full       Yes       Yes                      +----------+------------+---------+-----------+----------+-------+ Radial        Full                                          +----------+------------+---------+-----------+----------+-------+ Ulnar         Full                                          +----------+------------+---------+-----------+----------+-------+ Cephalic      Full                                          +----------+------------+---------+-----------+----------+-------+ Basilic       Full                                          +----------+------------+---------+-----------+----------+-------+  Summary:  Right: No evidence of thrombosis in the subclavian.  Left: No evidence of deep vein thrombosis in the upper extremity. No evidence of superficial vein thrombosis in the upper extremity.  *See table(s) above for measurements and observations.  Diagnosing physician: Waverly Ferrari MD Electronically signed by Waverly Ferrari MD on 02/15/2021 at 3:13:55 PM.    Final     Scheduled Meds:  benztropine  1 mg Per Tube  Daily   enoxaparin  (LOVENOX) injection  40 mg Subcutaneous Q24H   free water  100 mL Per Tube Q4H   insulin aspart  0-15 Units Subcutaneous Q4H   levETIRAcetam  1,000 mg Per Tube BID   multivitamin with minerals  1 tablet Per Tube Daily   valACYclovir  500 mg Per Tube QHS   Continuous Infusions:  feeding supplement (GLUCERNA 1.2 CAL) 1,000 mL (02/16/21 0441)    Principal Problem:   Acute encephalopathy Active Problems:   Hypoglycemia   Constipation   Bipolar disorder (HCC)   Diabetes (HCC)   AMS (altered mental status)   Poor appetite   Psychogenic loss of appetite   Intellectual delay   Consultants: Neurology Psychiatry  Procedures: EEG Long-term EEG  Antibiotics: Valacyclovir 8/15 through 8/28   Time spent: 5 minutes    Junious SilkAllison Jatavius Ellenwood ANP  [facility] 7 am - 330 pm/M-F for direct patient care and secure chat Please refer to Amion for contact info 22  days

## 2021-02-16 NOTE — Progress Notes (Signed)
Occupational Therapy Treatment Patient Details Name: [name] MRN: [id number] DOB: [birth year] Today's Date: 02/16/2021    History of present illness 55 y.o. female with medical history significant of intellectual disability, bipolar disorder, seizure disorder, type II diabetes, hypertension, hyperlipidemia, immune deficiency disorder presenting to the ED for evaluation of altered mental status   OT comments  Patient with poor progress toward patient focused goals.  Difficult to engage patient in any functional tasks bed level or sitting edge of bed.  Patient able to sit edge of bed with +2 assist, eyes did open, but she never made eye contact.  Patient returned to supine and positioned on the opposite side.  OT will attempt a time or two more, but if level of participation does not improve, OT to defer further OT to SNF.    Follow Up Recommendations  SNF    Equipment Recommendations  Wheelchair (measurements OT);Wheelchair cushion (measurements OT)    Recommendations for Other Services      Precautions / Restrictions Precautions Precautions: Fall Precaution Comments: Wrist restraints.  Feeding tube Restrictions Weight Bearing Restrictions: No       Mobility Bed Mobility Overal bed mobility: Needs Assistance Bed Mobility: Rolling;Supine to Sit;Sit to Supine Rolling: Total assist;+2 for physical assistance   Supine to sit: Total assist;+2 for physical assistance Sit to supine: Total assist;+2 for physical assistance     Patient Response: Flat affect  Transfers                 General transfer comment: not safe.    Balance Overall balance assessment: Needs assistance Sitting-balance support: Feet supported Sitting balance-Leahy Scale: Zero Sitting balance - Comments: patient did support herself with mIn Guard for short bouts, but generally leaning L Postural control: Posterior lean;Left lateral lean                                 ADL either  performed or assessed with clinical judgement   ADL Overall ADL's : Needs assistance/impaired Eating/Feeding: NPO   Grooming: Wash/dry face;Wash/dry hands;Total assistance;Bed level           Upper Body Dressing : Total assistance;Bed level   Lower Body Dressing: Total assistance;Bed level       Toileting- Clothing Manipulation and Hygiene: Total assistance;Bed level               Vision   Additional Comments: Eye closed majority of the session, did not make eye contact   Perception     Praxis      Cognition Arousal/Alertness: Lethargic Behavior During Therapy: Flat affect Overall Cognitive Status: No family/caregiver present to determine baseline cognitive functioning                                 General Comments: Did not follow any commands during 8/31 session                          Pertinent Vitals/ Pain       Pain Assessment: Faces Faces Pain Scale: No hurt  Frequency           Progress Toward Goals  OT Goals(current goals can now be found in the care plan section)  Progress towards OT goals: Not progressing toward goals - comment  Acute Rehab OT Goals Patient Stated Goal: None stated OT Goal Formulation: Patient unable to participate in goal setting Time For Goal Achievement: 02/25/21 Potential to Achieve Goals: Good  Plan Discharge plan remains appropriate    Co-evaluation    PT/OT/SLP Co-Evaluation/Treatment: Yes Reason for Co-Treatment: Complexity of the patient's impairments (multi-system involvement);Necessary to address cognition/behavior during functional activity;For patient/therapist safety   OT goals addressed during session: ADL's and self-care      AM-PAC OT "6 Clicks" Daily Activity     Outcome Measure   Help from another person eating meals?: Total Help from another person taking care of personal grooming?:  Total Help from another person toileting, which includes using toliet, bedpan, or urinal?: Total Help from another person bathing (including washing, rinsing, drying)?: Total Help from another person to put on and taking off regular upper body clothing?: Total Help from another person to put on and taking off regular lower body clothing?: Total 6 Click Score: 6    End of Session    OT Visit Diagnosis: Other symptoms and signs involving cognitive function   Activity Tolerance Patient limited by lethargy   Patient Left in bed;with call bell/phone within reach;with bed alarm set;with restraints reapplied   Nurse Communication          Time: 0454-0981 OT Time Calculation (min): 25 min  Charges: OT General Charges $OT Visit: 1 Visit OT Treatments $Self Care/Home Management : 8-22 mins  02/16/2021  RP, OTR/L  Acute Rehabilitation Services  Office:  [phone]    Suzanna Obey 02/16/2021, 1:27 PM

## 2021-02-16 NOTE — Plan of Care (Signed)
Pt is responsive to pain, prior to this pt was responsive to voice. Pt VS stable, pt noted at times opening eyes on her own and reaching for things. Otherwise pt resting, snoring.  Pt has purewick for incontinence, Q2 turn. [facility] on call paged to inform of this, however pt has had episode like this on 8/30. No distress noted.   Problem: Education: Goal: Knowledge of General Education information will improve Description: Including pain rating scale, medication(s)/side effects and non-pharmacologic comfort measures Outcome: Progressing   Problem: Health Behavior/Discharge Planning: Goal: Ability to manage health-related needs will improve Outcome: Progressing   Problem: Clinical Measurements: Goal: Ability to maintain clinical measurements within normal limits will improve Outcome: Progressing Goal: Will remain free from infection Outcome: Progressing Goal: Diagnostic test results will improve Outcome: Progressing Goal: Respiratory complications will improve Outcome: Progressing Goal: Cardiovascular complication will be avoided Outcome: Progressing   Problem: Activity: Goal: Risk for activity intolerance will decrease Outcome: Progressing   Problem: Nutrition: Goal: Adequate nutrition will be maintained Outcome: Progressing   Problem: Coping: Goal: Level of anxiety will decrease Outcome: Progressing   Problem: Elimination: Goal: Will not experience complications related to bowel motility Outcome: Progressing Goal: Will not experience complications related to urinary retention Outcome: Progressing   Problem: Pain Managment: Goal: General experience of comfort will improve Outcome: Progressing   Problem: Safety: Goal: Ability to remain free from injury will improve Outcome: Progressing   Problem: Skin Integrity: Goal: Risk for impaired skin integrity will decrease Outcome: Progressing   Problem: Safety: Goal: Non-violent Restraint(s) Outcome: Progressing    Problem: Education: Goal: Expressions of having a comfortable level of knowledge regarding the disease process will increase Outcome: Progressing   Problem: Coping: Goal: Ability to adjust to condition or change in health will improve Outcome: Progressing Goal: Ability to identify appropriate support needs will improve Outcome: Progressing   Problem: Health Behavior/Discharge Planning: Goal: Compliance with prescribed medication regimen will improve Outcome: Progressing   Problem: Medication: Goal: Risk for medication side effects will decrease Outcome: Progressing   Problem: Clinical Measurements: Goal: Complications related to the disease process, condition or treatment will be avoided or minimized Outcome: Progressing Goal: Diagnostic test results will improve Outcome: Progressing   Problem: Safety: Goal: Verbalization of understanding the information provided will improve Outcome: Progressing   Problem: Self-Concept: Goal: Level of anxiety will decrease Outcome: Progressing Goal: Ability to verbalize feelings about condition will improve Outcome: Progressing

## 2021-02-16 NOTE — Progress Notes (Signed)
Pt noted to be responsive to pain, on call [facility] provider paged to inform.This is not new,   p[t had these symptoms on previous shift.   Pt was drowsy and responsive to voice and then to pain.Pt snoring VS stable.MEWs score yellow, charge nurse informed, on call provider paged as well.    Pt currently noted stretching and opening eyes and going back to sleep.

## 2021-02-17 DIAGNOSIS — E876 Hypokalemia: Secondary | ICD-10-CM | POA: Diagnosis not present

## 2021-02-17 LAB — COMPREHENSIVE METABOLIC PANEL
ALT: 38 U/L (ref 0–44)
AST: 41 U/L (ref 15–41)
Albumin: 3 g/dL — ABNORMAL LOW (ref 3.5–5.0)
Alkaline Phosphatase: 70 U/L (ref 38–126)
Anion gap: 7 (ref 5–15)
BUN: 13 mg/dL (ref 6–20)
CO2: 30 mmol/L (ref 22–32)
Calcium: 9.5 mg/dL (ref 8.9–10.3)
Chloride: 102 mmol/L (ref 98–111)
Creatinine, Ser: 0.69 mg/dL (ref 0.44–1.00)
GFR, Estimated: >60 mL/min (ref >60)
Glucose, Bld: 185 mg/dL — ABNORMAL HIGH (ref 70–99)
Potassium: 3.7 mmol/L (ref 3.5–5.1)
Sodium: 139 mmol/L (ref 135–145)
Total Bilirubin: 0.6 mg/dL (ref 0.3–1.2)
Total Protein: 6.9 g/dL (ref 6.5–8.1)

## 2021-02-17 LAB — GLUCOSE, CAPILLARY
Glucose-Capillary: 176 mg/dL — ABNORMAL HIGH (ref 70–99)
Glucose-Capillary: 201 mg/dL — ABNORMAL HIGH (ref 70–99)
Glucose-Capillary: 178 mg/dL — ABNORMAL HIGH (ref 70–99)
Glucose-Capillary: 189 mg/dL — ABNORMAL HIGH (ref 70–99)
Glucose-Capillary: 195 mg/dL — ABNORMAL HIGH (ref 70–99)

## 2021-02-17 LAB — CBC WITH DIFFERENTIAL/PLATELET
Abs Immature Granulocytes: 0.05 10*3/uL (ref 0.00–0.07)
Basophils Absolute: 0.1 10*3/uL (ref 0.0–0.1)
Basophils Relative: 1 %
Eosinophils Absolute: 0.1 10*3/uL (ref 0.0–0.5)
Eosinophils Relative: 2 %
HCT: 36.6 % (ref 36.0–46.0)
Hemoglobin: 11.7 g/dL — ABNORMAL LOW (ref 12.0–15.0)
Immature Granulocytes: 1 %
Lymphocytes Relative: 29 %
Lymphs Abs: 2.7 10*3/uL (ref 0.7–4.0)
MCH: 29.5 pg (ref 26.0–34.0)
MCHC: 32 g/dL (ref 30.0–36.0)
MCV: 92.4 fL (ref 80.0–100.0)
Monocytes Absolute: 0.6 10*3/uL (ref 0.1–1.0)
Monocytes Relative: 6 %
Neutro Abs: 5.8 10*3/uL (ref 1.7–7.7)
Neutrophils Relative %: 61 %
Platelets: 394 10*3/uL (ref 150–400)
RBC: 3.96 MIL/uL (ref 3.87–5.11)
RDW: 12.5 % (ref 11.5–15.5)
WBC: 9.3 10*3/uL (ref 4.0–10.5)
nRBC: 0 % (ref 0.0–0.2)

## 2021-02-17 LAB — MAGNESIUM: Magnesium: 2.3 mg/dL (ref 1.7–2.4)

## 2021-02-17 LAB — PHOSPHORUS: Phosphorus: 3.3 mg/dL (ref 2.5–4.6)

## 2021-02-17 MED ORDER — LORAZEPAM 1 MG PO TABS
1.0000 mg | ORAL_TABLET | Freq: Four times a day (QID) | ORAL | Status: DC
  Administered 2021-02-17 – 2021-02-19 (×9): 1 mg
  Filled 2021-02-17 (×9): qty 1

## 2021-02-17 NOTE — Consult Note (Signed)
Reason for Consult:Flat affect, refusal to eat, and medication management Referring Physician: Stephania Fragmin, MD  [name] is an 55 y.o. female.  HPI:  [name] is a 55 year old female admitted for evaluation of acute mental status change and consulted to the psychiatry service for potential medication management in light of flat affect and refusal to eat.   10 AM- Today patient is catatonic in appearance. Despite multiple verbal prompts patient would make no response and stared ahead blinking occasionally.    Per chart review- Patient originally admitted for AMS. Per patients sister she had not been eating for a few days and saying bizarre things. Prior to this episode patient had been independent but no longer attended to ADL's. She was started on Seroquel PO on 8/10. On 8/12 she was started on Haldol IV. Due to excess sedation her Haldol was stopped on 8/30   Update 2 PM: Went back to patients room after administration of Ativan. She was mildly interactive. She was able to say that she was doing ok today. She was able to wiggle her fingers and toes when asked. She reports no pain currently. However, after answering these few questions she would not answer anymore.  During this entire interview she continued to stare ahead blinking occasionally with little other movement.   Past Medical History:  Diagnosis Date   Diabetes mellitus without complication (HCC)    Hypertension    Immune deficiency disorder (HCC)    Mild mental retardation     Past Surgical History:  Procedure Laterality Date   IR GASTROSTOMY TUBE MOD SED  02/14/2021    Family History  Problem Relation Age of Onset   Hypertension Other     Social History:  reports that she has never smoked. She has never used smokeless tobacco. She reports that she does not drink alcohol and does not use drugs.  Allergies:  Allergies  Allergen Reactions   Penicillins    Sulfa Antibiotics    Tomato      Medications: I have reviewed the patient's current medications.  Results for orders placed or performed during the hospital encounter of 01/23/21 (from the past 48 hour(s))  Glucose, capillary     Status: Abnormal   Collection Time: 02/15/21  3:43 PM  Result Value Ref Range   Glucose-Capillary 121 (H) 70 - 99 mg/dL    Comment: Glucose reference range applies only to samples taken after fasting for at least 8 hours.   Comment 1 Notify RN    Comment 2 Document in Chart   Urinalysis, Complete w Microscopic Urine, Catheterized     Status: Abnormal   Collection Time: 02/15/21  4:28 PM  Result Value Ref Range   Color, Urine YELLOW YELLOW   APPearance HAZY (A) CLEAR   Specific Gravity, Urine 1.021 1.005 - 1.030   pH 6.0 5.0 - 8.0   Glucose, UA NEGATIVE NEGATIVE mg/dL   Hgb urine dipstick NEGATIVE NEGATIVE   Bilirubin Urine NEGATIVE NEGATIVE   Ketones, ur 5 (A) NEGATIVE mg/dL   Protein, ur NEGATIVE NEGATIVE mg/dL   Nitrite NEGATIVE NEGATIVE   Leukocytes,Ua NEGATIVE NEGATIVE   RBC / HPF 0-5 0 - 5 RBC/hpf   WBC, UA 0-5 0 - 5 WBC/hpf   Bacteria, UA NONE SEEN NONE SEEN   Squamous Epithelial / LPF 0-5 0 - 5   Mucus PRESENT    Amorphous Crystal PRESENT     Comment: Performed at [facility], [address] [place],  [place] [id number]  Glucose, capillary     Status: Abnormal   Collection Time: 02/15/21  4:39 PM  Result Value Ref Range   Glucose-Capillary 131 (H) 70 - 99 mg/dL    Comment: Glucose reference range applies only to samples taken after fasting for at least 8 hours.  Blood gas, venous     Status: Abnormal   Collection Time: 02/15/21  4:41 PM  Result Value Ref Range   FIO2 21.00    pH, Ven 7.398 7.250 - 7.430   pCO2, Ven 49.2 44.0 - 60.0 mmHg   pO2, Ven 42.6 32.0 - 45.0 mmHg   Bicarbonate 29.5 (H) 20.0 - 28.0 mmol/L   Acid-Base Excess 4.9 (H) 0.0 - 2.0 mmol/L   O2 Saturation 74.1 %   Patient temperature 37.5    Drawn by 5810    Sample type VENOUS     Comment:  Performed at [facility], [address] [place], [place] [address]  Glucose, capillary     Status: Abnormal   Collection Time: 02/15/21  5:12 PM  Result Value Ref Range   Glucose-Capillary 129 (H) 70 - 99 mg/dL    Comment: Glucose reference range applies only to samples taken after fasting for at least 8 hours.  Glucose, capillary     Status: Abnormal   Collection Time: 02/15/21  9:12 PM  Result Value Ref Range   Glucose-Capillary 124 (H) 70 - 99 mg/dL    Comment: Glucose reference range applies only to samples taken after fasting for at least 8 hours.   Comment 1 Notify RN    Comment 2 Document in Chart   Glucose, capillary     Status: Abnormal   Collection Time: 02/15/21 11:56 PM  Result Value Ref Range   Glucose-Capillary 117 (H) 70 - 99 mg/dL    Comment: Glucose reference range applies only to samples taken after fasting for at least 8 hours.   Comment 1 Notify RN    Comment 2 Document in Chart   Magnesium     Status: None   Collection Time: 02/16/21  3:22 AM  Result Value Ref Range   Magnesium 2.2 1.7 - 2.4 mg/dL    Comment: Performed at [facility], [address] [place], [place] [address]  Phosphorus     Status: None   Collection Time: 02/16/21  3:22 AM  Result Value Ref Range   Phosphorus 3.5 2.5 - 4.6 mg/dL    Comment: Performed at [facility], [address] [place], [place] [address]  CBC with Differential/Platelet     Status: Abnormal   Collection Time: 02/16/21  3:22 AM  Result Value Ref Range   WBC 10.3 4.0 - 10.5 K/uL   RBC 3.97 3.87 - 5.11 MIL/uL   Hemoglobin 11.7 (L) 12.0 - 15.0 g/dL   HCT 29.537.1 62.136.0 - 30.846.0 %   MCV 93.5 80.0 - 100.0 fL   MCH 29.5 26.0 - 34.0 pg   MCHC 31.5 30.0 - 36.0 g/dL   RDW 65.712.5 84.611.5 - 96.215.5 %   Platelets 395 150 - 400 K/uL   nRBC 0.0 0.0 - 0.2 %   Neutrophils Relative % 55 %   Neutro Abs 5.6 1.7 - 7.7 K/uL   Lymphocytes Relative 36 %   Lymphs Abs 3.7 0.7 - 4.0 K/uL   Monocytes Relative 7 %   Monocytes  Absolute 0.7 0.1 - 1.0 K/uL   Eosinophils Relative 1 %   Eosinophils Absolute 0.1  0.0 - 0.5 K/uL   Basophils Relative 1 %   Basophils Absolute 0.1 0.0 - 0.1 K/uL   Immature Granulocytes 0 %   Abs Immature Granulocytes 0.03 0.00 - 0.07 K/uL    Comment: Performed at [facility], [address] [place], [place] [address]  Comprehensive metabolic panel     Status: Abnormal   Collection Time: 02/16/21  3:22 AM  Result Value Ref Range   Sodium 139 135 - 145 mmol/L   Potassium 4.0 3.5 - 5.1 mmol/L   Chloride 103 98 - 111 mmol/L   CO2 25 22 - 32 mmol/L   Glucose, Bld 167 (H) 70 - 99 mg/dL    Comment: Glucose reference range applies only to samples taken after fasting for at least 8 hours.   BUN 14 6 - 20 mg/dL   Creatinine, Ser 9.52 0.44 - 1.00 mg/dL   Calcium 9.6 8.9 - 84.1 mg/dL   Total Protein 7.1 6.5 - 8.1 g/dL   Albumin 3.2 (L) 3.5 - 5.0 g/dL   AST 50 (H) 15 - 41 U/L   ALT 46 (H) 0 - 44 U/L   Alkaline Phosphatase 71 38 - 126 U/L   Total Bilirubin 0.8 0.3 - 1.2 mg/dL   GFR, Estimated >32 >44 mL/min    Comment: (NOTE) Calculated using the CKD-EPI Creatinine Equation (2021)    Anion gap 11 5 - 15    Comment: Performed at [facility], [address] [place], [place] [address]  Glucose, capillary     Status: Abnormal   Collection Time: 02/16/21  3:36 AM  Result Value Ref Range   Glucose-Capillary 163 (H) 70 - 99 mg/dL    Comment: Glucose reference range applies only to samples taken after fasting for at least 8 hours.   Comment 1 Notify RN    Comment 2 Document in Chart   Glucose, capillary     Status: Abnormal   Collection Time: 02/16/21  5:22 AM  Result Value Ref Range   Glucose-Capillary 162 (H) 70 - 99 mg/dL    Comment: Glucose reference range applies only to samples taken after fasting for at least 8 hours.   Comment 1 Notify RN   Glucose, capillary     Status: Abnormal   Collection Time: 02/16/21 10:08 AM  Result Value Ref Range   Glucose-Capillary 186  (H) 70 - 99 mg/dL    Comment: Glucose reference range applies only to samples taken after fasting for at least 8 hours.   Comment 1 Notify RN    Comment 2 Document in Chart   Glucose, capillary     Status: Abnormal   Collection Time: 02/16/21 12:04 PM  Result Value Ref Range   Glucose-Capillary 173 (H) 70 - 99 mg/dL    Comment: Glucose reference range applies only to samples taken after fasting for at least 8 hours.   Comment 1 Notify RN    Comment 2 Document in Chart   Glucose, capillary     Status: Abnormal   Collection Time: 02/16/21  1:23 PM  Result Value Ref Range   Glucose-Capillary 159 (H) 70 - 99 mg/dL    Comment: Glucose reference range applies only to samples taken after fasting for at least 8 hours.  Glucose, capillary     Status: Abnormal   Collection Time: 02/16/21  3:58 PM  Result Value Ref Range   Glucose-Capillary 151 (H) 70 - 99 mg/dL    Comment: Glucose reference range applies  only to samples taken after fasting for at least 8 hours.   Comment 1 Notify RN    Comment 2 Document in Chart   Glucose, capillary     Status: Abnormal   Collection Time: 02/16/21  8:44 PM  Result Value Ref Range   Glucose-Capillary 171 (H) 70 - 99 mg/dL    Comment: Glucose reference range applies only to samples taken after fasting for at least 8 hours.  Glucose, capillary     Status: Abnormal   Collection Time: 02/16/21 11:33 PM  Result Value Ref Range   Glucose-Capillary 190 (H) 70 - 99 mg/dL    Comment: Glucose reference range applies only to samples taken after fasting for at least 8 hours.  Magnesium     Status: None   Collection Time: 02/17/21  1:48 AM  Result Value Ref Range   Magnesium 2.3 1.7 - 2.4 mg/dL    Comment: Performed at [facility], [address] [place], [place] [address]  Phosphorus     Status: None   Collection Time: 02/17/21  1:48 AM  Result Value Ref Range   Phosphorus 3.3 2.5 - 4.6 mg/dL    Comment: Performed at [facility], [address] [place], [place] [address]  CBC with Differential/Platelet     Status: Abnormal   Collection Time: 02/17/21  1:48 AM  Result Value Ref Range   WBC 9.3 4.0 - 10.5 K/uL   RBC 3.96 3.87 - 5.11 MIL/uL   Hemoglobin 11.7 (L) 12.0 - 15.0 g/dL   HCT 63.0 16.0 - 10.9 %   MCV 92.4 80.0 - 100.0 fL   MCH 29.5 26.0 - 34.0 pg   MCHC 32.0 30.0 - 36.0 g/dL   RDW 32.3 55.7 - 32.2 %   Platelets 394 150 - 400 K/uL   nRBC 0.0 0.0 - 0.2 %   Neutrophils Relative % 61 %   Neutro Abs 5.8 1.7 - 7.7 K/uL   Lymphocytes Relative 29 %   Lymphs Abs 2.7 0.7 - 4.0 K/uL   Monocytes Relative 6 %   Monocytes Absolute 0.6 0.1 - 1.0 K/uL   Eosinophils Relative 2 %   Eosinophils Absolute 0.1 0.0 - 0.5 K/uL   Basophils Relative 1 %   Basophils Absolute 0.1 0.0 - 0.1 K/uL   Immature Granulocytes 1 %   Abs Immature Granulocytes 0.05 0.00 - 0.07 K/uL    Comment: Performed at [facility], [address] [place], [place] [address]  Comprehensive metabolic panel     Status: Abnormal   Collection Time: 02/17/21  1:48 AM  Result Value Ref Range   Sodium 139 135 - 145 mmol/L   Potassium 3.7 3.5 - 5.1 mmol/L   Chloride 102 98 - 111 mmol/L   CO2 30 22 - 32 mmol/L   Glucose, Bld 185 (H) 70 - 99 mg/dL    Comment: Glucose reference range applies only to samples taken after fasting for at least 8 hours.   BUN 13 6 - 20 mg/dL   Creatinine, Ser 7.06 0.44 - 1.00 mg/dL   Calcium 9.5 8.9 - 23.7 mg/dL   Total Protein 6.9 6.5 - 8.1 g/dL   Albumin 3.0 (L) 3.5 - 5.0 g/dL   AST 41 15 - 41 U/L   ALT 38 0 - 44 U/L   Alkaline Phosphatase 70 38 - 126 U/L   Total Bilirubin 0.6 0.3 - 1.2 mg/dL   GFR, Estimated >62 >83 mL/min  Comment: (NOTE) Calculated using the CKD-EPI Creatinine Equation (2021)    Anion gap 7 5 - 15    Comment: Performed at [facility], [address] [place], [place] [address]  Glucose, capillary     Status: Abnormal   Collection Time: 02/17/21  4:05 AM  Result Value Ref Range    Glucose-Capillary 176 (H) 70 - 99 mg/dL    Comment: Glucose reference range applies only to samples taken after fasting for at least 8 hours.  Glucose, capillary     Status: Abnormal   Collection Time: 02/17/21  7:45 AM  Result Value Ref Range   Glucose-Capillary 201 (H) 70 - 99 mg/dL    Comment: Glucose reference range applies only to samples taken after fasting for at least 8 hours.   Comment 1 Notify RN    Comment 2 Document in Chart   Glucose, capillary     Status: Abnormal   Collection Time: 02/17/21 11:31 AM  Result Value Ref Range   Glucose-Capillary 178 (H) 70 - 99 mg/dL    Comment: Glucose reference range applies only to samples taken after fasting for at least 8 hours.   Comment 1 Notify RN    Comment 2 Document in Chart     CT HEAD WO CONTRAST ( )  Result Date: 02/15/2021 CLINICAL DATA:  Encephalopathy EXAM: CT HEAD WITHOUT CONTRAST TECHNIQUE: Contiguous axial images were obtained from the base of the skull through the vertex without intravenous contrast. COMPARISON:  None. FINDINGS: Brain: There is no mass, hemorrhage or extra-axial collection. The size and configuration of the ventricles and extra-axial CSF spaces are normal. The brain parenchyma is normal, without acute or chronic infarction. Vascular: No abnormal hyperdensity of the major intracranial arteries or dural venous sinuses. No intracranial atherosclerosis. Skull: The visualized skull base, calvarium and extracranial soft tissues are normal. Sinuses/Orbits: No fluid levels or advanced mucosal thickening of the visualized paranasal sinuses. No mastoid or middle ear effusion. The orbits are normal. IMPRESSION: Normal head CT. Electronically Signed   By: Deatra Robinson M.D.   On: 02/15/2021 23:50   VAS [place] UPPER EXTREMITY VENOUS DUPLEX  Result Date: 02/15/2021 UPPER VENOUS STUDY  Patient Name:  JAZZMA NEIDHARDT  Date of Exam:   02/15/2021 Medical Rec #: [id number]        Accession #:    [id number] Date of Birth: 03-30-1966         Patient Gender: F Patient Age:   37 years Exam Location:  [facility] Procedure:      VAS [place] UPPER EXTREMITY VENOUS DUPLEX Referring Phys: PRANAV PATEL --------------------------------------------------------------------------------  Indications: Edema Comparison Study: no prior Performing Technologist: [place] Ponder RVS  Examination Guidelines: A complete evaluation includes B-mode imaging, spectral Doppler, color Doppler, and power Doppler as needed of all accessible portions of each vessel. Bilateral testing is considered an integral part of a complete examination. Limited examinations for reoccurring indications may be performed as noted.  Right Findings: +----------+------------+---------+-----------+----------+-------+ RIGHT     CompressiblePhasicitySpontaneousPropertiesSummary +----------+------------+---------+-----------+----------+-------+ Subclavian    Full       Yes       Yes                      +----------+------------+---------+-----------+----------+-------+  Left Findings: +----------+------------+---------+-----------+----------+-------+ LEFT      CompressiblePhasicitySpontaneousPropertiesSummary +----------+------------+---------+-----------+----------+-------+ IJV           Full       Yes       Yes                      +----------+------------+---------+-----------+----------+-------+  Subclavian    Full       Yes       Yes                      +----------+------------+---------+-----------+----------+-------+ Axillary      Full       Yes       Yes                      +----------+------------+---------+-----------+----------+-------+ Brachial      Full       Yes       Yes                      +----------+------------+---------+-----------+----------+-------+ Radial        Full                                          +----------+------------+---------+-----------+----------+-------+ Ulnar         Full                                           +----------+------------+---------+-----------+----------+-------+ Cephalic      Full                                          +----------+------------+---------+-----------+----------+-------+ Basilic       Full                                          +----------+------------+---------+-----------+----------+-------+  Summary:  Right: No evidence of thrombosis in the subclavian.  Left: No evidence of deep vein thrombosis in the upper extremity. No evidence of superficial vein thrombosis in the upper extremity.  *See table(s) above for measurements and observations.  Diagnosing physician: Waverly Ferrari MD Electronically signed by Waverly Ferrari MD on 02/15/2021 at 3:13:55 PM.    Final     Review of Systems  Unable to perform ROS: Acuity of condition  Blood pressure 139/75, pulse 81, temperature 98.1 F (36.7 C), temperature source Oral, resp. rate 20, weight 72.1 kg, SpO2 97 %. Physical Exam Vitals and nursing note reviewed.  Constitutional:      General: She is not in acute distress.    Appearance: Normal appearance. She is not ill-appearing or toxic-appearing.  HENT:     Head: Normocephalic and atraumatic.  Pulmonary:     Effort: Pulmonary effort is normal.  Musculoskeletal:     Comments: Able to move fingers and toes on all limbs when prompted.    Assessment/Plan: When first seen this morning patient was in a catatonic state with no response to environment so Ativan was started.  Went to see patient again 45 minutes after administration of first dose of Ativan.  At this point she was able to respond to her environment.  She was able to answer a few questions for me and have some movement in her limbs.  Am hopeful that when seen again tomorrow her symptoms will continue to improve.  Currently still holding Haldol per primary team for the lethargy she was experiencing.  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 Once resumed, continue Haldol 2.5 mg per tube qAM and at lunchtime, and 5 mg qHS per tube.  Qtc 445.  -Continue Cogentin 1 mg per tube daily. -Remeron 7.5 mg discontinued by primary team.   Catatonia: -Start Ativan 1 mg q6 per tube   Disposition: If accepted by [facility], she would be appropriate for services. If not, SNF placement upon discharge and outpatient psychiatric follow-up. Psychiatry will continue to follow during this admission.   Lauro Franklin 02/17/2021, 12:03 PM

## 2021-02-17 NOTE — Progress Notes (Addendum)
[facility] PROGRESS NOTE  [name] UTM:[id number] DOB: Oct 29, 1965 DOA: 01/23/2021 PCP: Mirna Mires, MD  Status: Remains inpatient appropriate because:Altered mental status, Unsafe d/c plan, IV treatments appropriate due to intensity of illness or inability to take PO, and Inpatient level of care appropriate due to severity of illness  Dispo: The patient is from: Home              Anticipated d/c is to: SNF: [facility] that can accommodate both IDD needs and PEG tube              Patient currently is not medically stable to d/c.   Difficult to place patient Yes   Level of care: Med-Surg  Code Status: Full Family Communication:  DVT prophylaxis: Lovenox COVID vaccination status: Unknown   HPI: 55 y.o. F with DM, HTN, developmental delay who presented with decreased oral intake and "saying bizarre things".   In the ER, CT head unremarkable.  She was hypoglycemic and otherwise had normal chemistries and hemogram.  Admitted for evaluation of erratic behavior.  Subjective: Patient awake.  Limited eye contact.  Will not verbally respond but nodded at least once when asked if she was in pain.  Attempted to encourage the patient by reporting that discharging to a group home would be an option but she would have to wake up, eat by mouth and begin walking.  Also encouraged her that once at a group home she would once again be enrolled in a day program..  No response from patient when this option offered.  Objective: Vitals:   02/17/21 0405 02/17/21 0747  BP: 123/73 121/88  Pulse: 82 96  Resp: 16 17  Temp: 99.1 F (37.3 C) 98.5 F (36.9 C)  SpO2: 97% 99%    Intake/Output Summary (Last 24 hours) at 02/17/2021 0816 Last data filed at 02/17/2021 0405 Gross per 24 hour  Intake 601 ml  Output 1100 ml  Net -499 ml   Filed Weights   02/15/21 0500 02/16/21 0500 02/17/21 0430  Weight: 71 kg 73.3 kg 72.1 kg    Exam:  Constitutional: NAD, calm, comfortable Respiratory:  Lungs CTA Cardiovascular: Regular rate and rhythm, S1S2, No extremity edema. 2+ pedal pulses. No carotid bruits.  Abdomen: PEG tube placed 8/29. LBM 8/29 Neurologic: Difficult to assess given patient's refusal to participate.  Purposefully kept eyelids shut with attempts to examine pupils.  Prior to PEG tube placement patient had spontaneous movement of extremities but currently is not following commands Psychiatric: Appears to be drowsy but patient is also purposefully resisting opening of eyes for examination.  Currently is nonverbal.   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  Brain imaging unremarkable.  EEG unremarkable.  UA negative, urine drug screen negative, procalcitonin low.  B12, ammonia, TSH normal. Acute metabolic encephalopathy ruled out. Psychiatry consulted and suspected recent change in environment precipitated development of patient's withdrawn state Short-term altered mentation postplacement of PEG tube.  EEG and CT head negative.  Neurology evaluated early in the hospitalization and given patient's underlying fixed delusions regarding her mouth and her legs leave her mental status changes were primarily behavioral and etiology and defer to psychiatry.  Recent altered mentation post PEG placement again likely behavioral but as a precaution Keppra dose decreased on 8/31.  Levetiracetam level pending Psych starting Ativan noting improvement after initial test dose today  Poor oral intake 2/2 fixed delusions/Malnutrition/Obesity PEG tube placed on  8/29  Ethics team consulted who saw no ethical barrier to placing PEG tube and initiating feedings noting PEG tube can be easily removed once patient starts eating again. Nutrition Problem: Inadequate oral intake Etiology: lethargy/confusion Signs/Symptoms: meal completion < 25%, per patient/family  report Interventions: Tube feeding   Seizure disorder POA Continue keppra with dose decreased on 8/31 due to altered mentation  Physical deconditioning secondary to fixed delusion Although I was able to get the patient to weakly lift both legs she has told providers that she is unable to walk secondary to not having any legs PT and OT evaluation-although patient is known to be developmentally delayed there may be some benefit to having SLP and OT perform cognitive evaluations   Diabetes melitis 2 uncontrolled on long-term insulin CBGs long-acting insulin at this juncture Continue Glucerna tube feeding Continue SSI correction-current CBGs over the past 24 hours between 117 to 186  Hypokalemia Hypomagnesemia Resolved         Data Reviewed: Basic Metabolic Panel: Recent Labs  Lab 02/13/21 0809 02/15/21 0139 02/16/21 0322 02/17/21 0148  NA  --  139 139 139  K  --  4.1 4.0 3.7  CL  --  105 103 102  CO2  --  26 25 30   GLUCOSE  --  124* 167* 185*  BUN  --  14 14 13   CREATININE  --  0.74 0.66 0.69  CALCIUM  --  9.7 9.6 9.5  MG 2.3 2.2 2.2 2.3  PHOS 4.0 3.7 3.5 3.3   Liver Function Tests: Recent Labs  Lab 02/15/21 0139 02/16/21 0322 02/17/21 0148  AST 48* 50* 41  ALT 39 46* 38  ALKPHOS 74 71 70  BILITOT 1.2 0.8 0.6  PROT 7.5 7.1 6.9  ALBUMIN 3.4* 3.2* 3.0*   No results for input(s): LIPASE, AMYLASE in the last 168 hours. No results for input(s): AMMONIA in the last 168 hours. CBC: Recent Labs  Lab 02/15/21 0139 02/16/21 0322 02/17/21 0148  WBC 14.5* 10.3 9.3  NEUTROABS 8.7* 5.6 5.8  HGB 12.6 11.7* 11.7*  HCT 39.0 37.1 36.6  MCV 92.6 93.5 92.4  PLT 420* 395 394   Cardiac Enzymes: No results for input(s): CKTOTAL, CKMB, CKMBINDEX, TROPONINI in the last 168 hours. BNP (last 3 results) Recent Labs    11/01/20 0405 11/02/20 0350 11/03/20 0155  BNP 15.0 8.6 6.0    ProBNP (last 3 results) No results for input(s): PROBNP in the last 8760  hours.  CBG: Recent Labs  Lab 02/16/21 1558 02/16/21 2044 02/16/21 2333 02/17/21 0405 02/17/21 0745  GLUCAP 151* 171* 190* 176* 201*    No results found for this or any previous visit (from the past 240 hour(s)).   Studies: CT HEAD WO CONTRAST (04/19/21)  Result Date: 02/15/2021 CLINICAL DATA:  Encephalopathy EXAM: CT HEAD WITHOUT CONTRAST TECHNIQUE: Contiguous axial images were obtained from the base of the skull through the vertex without intravenous contrast. COMPARISON:  None. FINDINGS: Brain: There is no mass, hemorrhage or extra-axial collection. The size and configuration of the ventricles and extra-axial CSF spaces are normal. The brain parenchyma is normal, without acute or chronic infarction. Vascular: No abnormal hyperdensity of the major intracranial arteries or dural venous sinuses. No intracranial atherosclerosis. Skull: The visualized skull base, calvarium and extracranial soft tissues are normal. Sinuses/Orbits: No fluid levels or advanced mucosal thickening of the visualized paranasal sinuses. No mastoid or middle ear effusion. The orbits are normal. IMPRESSION: Normal head CT. Electronically Signed   By:  Deatra RobinsonKevin  Herman M.D.   On: 02/15/2021 23:50   DG CHEST PORT 1 VIEW  Result Date: 02/15/2021 CLINICAL DATA:  Wheezing EXAM: PORTABLE CHEST 1 VIEW COMPARISON:  Portable exam 0835 hours compared to 01/24/2021 FINDINGS: Enlargement of cardiac silhouette. Mediastinal contours and pulmonary vascularity normal. Lungs clear. No infiltrate, pleural effusion, or pneumothorax. Osseous structures unremarkable. IMPRESSION: Mild enlargement of cardiac silhouette. No acute abnormalities. Electronically Signed   By: Ulyses SouthwardMark  Boles M.D.   On: 02/15/2021 10:42   EEG adult  Result Date: 02/15/2021 Charlsie QuestYadav, Priyanka O, MD     02/15/2021 11:26 AM Patient Name: Marissa DolphinCeleste H Pitre MRN: [id number] Epilepsy Attending: Charlsie QuestPriyanka O Yadav Referring Physician/Provider: Dr Lynden OxfordPranav Patel Date: 02/15/2021 Duration: 23 mins  Patient history:  55 year old female with altered mental status.  EEG to evaluate for seizures.  Level of alertness: Awake, asleep  AEDs during EEG study: LEV  Technical aspects: This EEG study was done with scalp electrodes positioned according to the 10-20 International system of electrode placement. Electrical activity was acquired at a sampling rate of 500Hz  and reviewed with a high frequency filter of 70Hz  and a low frequency filter of 1Hz . EEG data were recorded continuously and digitally stored.  Description: The posterior dominant rhythm consists of 9-10 Hz activity of moderate voltage (25-35 uV) seen predominantly in posterior head regions, symmetric and reactive to eye opening and eye closing. Sleep was characterized by vertex waves, sleep spindles (12-14hz ), maximal frontocentral region. Hyperventilation and photic stimulation were not performed.    IMPRESSION: This study is within normal limits. No seizures or epileptiform discharges were seen throughout the recording.  Priyanka O Yadav   VAS US UPPER EXTREMITY VENOUS DUPLEX  Result Date: 02/15/2021 UPPER VENOUS STUDY  Patient Name:  Marissa DolphinCELESTE H Twiggs  Date of Exam:   02/15/2021 Medical Rec #: [id number]        Accession #:    [id number] Date of Birth: April 02, 1966        Patient Gender: F Patient Age:   [age] years [facility] Procedure:      VAS US UPPER EXTREMITY VENOUS DUPLEX Referring Phys: PRANAV PATEL --------------------------------------------------------------------------------  Indications: Edema Comparison Study: no prior Performing Technologist: [place] PonderMegan Stricklin RVS  Examination Guidelines: A complete evaluation includes B-mode imaging, spectral Doppler, color Doppler, and power Doppler as needed of all accessible portions of each vessel. Bilateral testing is considered an integral part of a complete examination. Limited examinations for reoccurring indications may be performed as noted.  Right Findings:  +----------+------------+---------+-----------+----------+-------+ RIGHT     CompressiblePhasicitySpontaneousPropertiesSummary +----------+------------+---------+-----------+----------+-------+ Subclavian    Full       Yes       Yes                      +----------+------------+---------+-----------+----------+-------+  Left Findings: +----------+------------+---------+-----------+----------+-------+ LEFT      CompressiblePhasicitySpontaneousPropertiesSummary +----------+------------+---------+-----------+----------+-------+ IJV           Full       Yes       Yes                      +----------+------------+---------+-----------+----------+-------+ Subclavian    Full       Yes       Yes                      +----------+------------+---------+-----------+----------+-------+ Axillary      Full       Yes  Yes                      +----------+------------+---------+-----------+----------+-------+ Brachial      Full       Yes       Yes                      +----------+------------+---------+-----------+----------+-------+ Radial        Full                                          +----------+------------+---------+-----------+----------+-------+ Ulnar         Full                                          +----------+------------+---------+-----------+----------+-------+ Cephalic      Full                                          +----------+------------+---------+-----------+----------+-------+ Basilic       Full                                          +----------+------------+---------+-----------+----------+-------+  Summary:  Right: No evidence of thrombosis in the subclavian.  Left: No evidence of deep vein thrombosis in the upper extremity. No evidence of superficial vein thrombosis in the upper extremity.  *See table(s) above for measurements and observations.  Diagnosing physician: Waverly Ferrari MD Electronically signed by  Waverly Ferrari MD on 02/15/2021 at 3:13:55 PM.    Final     Scheduled Meds:  benztropine  1 mg Per Tube Daily   enoxaparin (LOVENOX) injection  40 mg Subcutaneous Q24H   free water  100 mL Per Tube Q4H   insulin aspart  0-15 Units Subcutaneous Q4H   levETIRAcetam  750 mg Per Tube BID   multivitamin with minerals  1 tablet Per Tube Daily   valACYclovir  500 mg Per Tube QHS   Continuous Infusions:  feeding supplement (GLUCERNA 1.2 CAL) 1,000 mL (02/16/21 1910)    Principal Problem:   Acute encephalopathy Active Problems:   Hypoglycemia   Constipation   Bipolar disorder (HCC)   Diabetes (HCC)   AMS (altered mental status)   Poor appetite   Psychogenic loss of appetite   Intellectual delay   Consultants: Neurology Psychiatry  Procedures: EEG Long-term EEG  Antibiotics: Valacyclovir 8/15 through 8/28   Time spent: 5 minutes    Junious Silk ANP  [facility] 7 am - 330 pm/M-F for direct patient care and secure chat Please refer to Amion for contact info 23  days

## 2021-02-17 NOTE — Plan of Care (Signed)
Pt is alert, non verbal. Pt Q2 turn. Pt turned and repositioned. Pt has pure wick in place. No signs or symptoms of pain.    Problem: Education: Goal: Knowledge of General Education information will improve Description: Including pain rating scale, medication(s)/side effects and non-pharmacologic comfort measures Outcome: Progressing   Problem: Health Behavior/Discharge Planning: Goal: Ability to manage health-related needs will improve Outcome: Progressing   Problem: Clinical Measurements: Goal: Ability to maintain clinical measurements within normal limits will improve Outcome: Progressing Goal: Will remain free from infection Outcome: Progressing Goal: Diagnostic test results will improve Outcome: Progressing Goal: Respiratory complications will improve Outcome: Progressing Goal: Cardiovascular complication will be avoided Outcome: Progressing   Problem: Activity: Goal: Risk for activity intolerance will decrease Outcome: Progressing   Problem: Nutrition: Goal: Adequate nutrition will be maintained Outcome: Progressing   Problem: Coping: Goal: Level of anxiety will decrease Outcome: Progressing   Problem: Elimination: Goal: Will not experience complications related to bowel motility Outcome: Progressing Goal: Will not experience complications related to urinary retention Outcome: Progressing   Problem: Pain Managment: Goal: General experience of comfort will improve Outcome: Progressing   Problem: Safety: Goal: Ability to remain free from injury will improve Outcome: Progressing   Problem: Skin Integrity: Goal: Risk for impaired skin integrity will decrease Outcome: Progressing   Problem: Safety: Goal: Non-violent Restraint(s) Outcome: Progressing   Problem: Education: Goal: Expressions of having a comfortable level of knowledge regarding the disease process will increase Outcome: Progressing   Problem: Coping: Goal: Ability to adjust to condition or  change in health will improve Outcome: Progressing Goal: Ability to identify appropriate support needs will improve Outcome: Progressing   Problem: Health Behavior/Discharge Planning: Goal: Compliance with prescribed medication regimen will improve Outcome: Progressing   Problem: Medication: Goal: Risk for medication side effects will decrease Outcome: Progressing   Problem: Clinical Measurements: Goal: Complications related to the disease process, condition or treatment will be avoided or minimized Outcome: Progressing Goal: Diagnostic test results will improve Outcome: Progressing   Problem: Safety: Goal: Verbalization of understanding the information provided will improve Outcome: Progressing   Problem: Self-Concept: Goal: Level of anxiety will decrease Outcome: Progressing Goal: Ability to verbalize feelings about condition will improve Outcome: Progressing

## 2021-02-18 DIAGNOSIS — E876 Hypokalemia: Secondary | ICD-10-CM | POA: Diagnosis not present

## 2021-02-18 LAB — GLUCOSE, CAPILLARY
Glucose-Capillary: 128 mg/dL — ABNORMAL HIGH (ref 70–99)
Glucose-Capillary: 158 mg/dL — ABNORMAL HIGH (ref 70–99)
Glucose-Capillary: 184 mg/dL — ABNORMAL HIGH (ref 70–99)
Glucose-Capillary: 187 mg/dL — ABNORMAL HIGH (ref 70–99)
Glucose-Capillary: 190 mg/dL — ABNORMAL HIGH (ref 70–99)
Glucose-Capillary: 218 mg/dL — ABNORMAL HIGH (ref 70–99)
Glucose-Capillary: 253 mg/dL — ABNORMAL HIGH (ref 70–99)

## 2021-02-18 LAB — MAGNESIUM: Magnesium: 2.2 mg/dL (ref 1.7–2.4)

## 2021-02-18 LAB — PHOSPHORUS: Phosphorus: 4 mg/dL (ref 2.5–4.6)

## 2021-02-18 LAB — LEVETIRACETAM LEVEL: Levetiracetam Lvl: 23.1 ug/mL (ref 10.0–40.0)

## 2021-02-18 MED ORDER — GLUCERNA 1.2 CAL PO LIQD
1000.0000 mL | ORAL | Status: AC
  Administered 2021-02-18 – 2021-02-21 (×7): 1000 mL
  Filled 2021-02-18 (×5): qty 1000

## 2021-02-18 NOTE — Consult Note (Signed)
Reason for Consult: Flat affect, refusal to eat, and medication management Referring Physician: Stephania FragminAnkit Amin, MD  [name] is an 55 y.o. female.  HPI:  [name] is a 55 year old female admitted for evaluation of acute mental status change and consulted to the psychiatry service for potential medication management in light of flat affect and refusal to eat.   Today she is alert and responding to questions.  She reports that she is feeling well today.  She reports that she slept okay last night.  She reports that she is hungry and would like to eat.  She states that she wants to brush her teeth and asked for a toothbrush.  She was also asking if she could get up and walk around.  She reports that she currently has no pain anywhere.  She reports no issues with any of her medications.  She reports no other concerns at this time.   Past Medical History:  Diagnosis Date   Diabetes mellitus without complication (HCC)    Hypertension    Immune deficiency disorder (HCC)    Mild mental retardation     Past Surgical History:  Procedure Laterality Date   IR GASTROSTOMY TUBE MOD SED  02/14/2021    Family History  Problem Relation Age of Onset   Hypertension Other     Social History:  reports that she has never smoked. She has never used smokeless tobacco. She reports that she does not drink alcohol and does not use drugs.  Allergies:  Allergies  Allergen Reactions   Penicillins    Sulfa Antibiotics    Tomato     Medications: I have reviewed the patient's current medications.  Results for orders placed or performed during the hospital encounter of 01/23/21 (from the past 48 hour(s))  Glucose, capillary     Status: Abnormal   Collection Time: 02/16/21 10:08 AM  Result Value Ref Range   Glucose-Capillary 186 (H) 70 - 99 mg/dL    Comment: Glucose reference range applies only to samples taken after fasting for at least 8 hours.   Comment 1 Notify RN    Comment 2 Document in  Chart   Glucose, capillary     Status: Abnormal   Collection Time: 02/16/21 12:04 PM  Result Value Ref Range   Glucose-Capillary 173 (H) 70 - 99 mg/dL    Comment: Glucose reference range applies only to samples taken after fasting for at least 8 hours.   Comment 1 Notify RN    Comment 2 Document in Chart   Glucose, capillary     Status: Abnormal   Collection Time: 02/16/21  1:23 PM  Result Value Ref Range   Glucose-Capillary 159 (H) 70 - 99 mg/dL    Comment: Glucose reference range applies only to samples taken after fasting for at least 8 hours.  Glucose, capillary     Status: Abnormal   Collection Time: 02/16/21  3:58 PM  Result Value Ref Range   Glucose-Capillary 151 (H) 70 - 99 mg/dL    Comment: Glucose reference range applies only to samples taken after fasting for at least 8 hours.   Comment 1 Notify RN    Comment 2 Document in Chart   Glucose, capillary     Status: Abnormal   Collection Time: 02/16/21  8:44 PM  Result Value Ref Range   Glucose-Capillary 171 (H) 70 - 99 mg/dL    Comment: Glucose reference range applies only to samples taken after fasting for at least 8  hours.  Glucose, capillary     Status: Abnormal   Collection Time: 02/16/21 11:33 PM  Result Value Ref Range   Glucose-Capillary 190 (H) 70 - 99 mg/dL    Comment: Glucose reference range applies only to samples taken after fasting for at least 8 hours.  Magnesium     Status: None   Collection Time: 02/17/21  1:48 AM  Result Value Ref Range   Magnesium 2.3 1.7 - 2.4 mg/dL    Comment: Performed at [facility], [address] [place], [place] [address]  Phosphorus     Status: None   Collection Time: 02/17/21  1:48 AM  Result Value Ref Range   Phosphorus 3.3 2.5 - 4.6 mg/dL    Comment: Performed at [facility], [address], [place], [place] [address]  CBC with Differential/Platelet     Status: Abnormal   Collection Time: 02/17/21  1:48 AM  Result Value Ref Range   WBC 9.3 4.0 - 10.5 K/uL    RBC 3.96 3.87 - 5.11 MIL/uL   Hemoglobin 11.7 (L) 12.0 - 15.0 g/dL   HCT 02.7 25.3 - 66.4 %   MCV 92.4 80.0 - 100.0 fL   MCH 29.5 26.0 - 34.0 pg   MCHC 32.0 30.0 - 36.0 g/dL   RDW 40.3 47.4 - 25.9 %   Platelets 394 150 - 400 K/uL   nRBC 0.0 0.0 - 0.2 %   Neutrophils Relative % 61 %   Neutro Abs 5.8 1.7 - 7.7 K/uL   Lymphocytes Relative 29 %   Lymphs Abs 2.7 0.7 - 4.0 K/uL   Monocytes Relative 6 %   Monocytes Absolute 0.6 0.1 - 1.0 K/uL   Eosinophils Relative 2 %   Eosinophils Absolute 0.1 0.0 - 0.5 K/uL   Basophils Relative 1 %   Basophils Absolute 0.1 0.0 - 0.1 K/uL   Immature Granulocytes 1 %   Abs Immature Granulocytes 0.05 0.00 - 0.07 K/uL    Comment: Performed at [facility], [address], [place], [place] [address]  Comprehensive metabolic panel     Status: Abnormal   Collection Time: 02/17/21  1:48 AM  Result Value Ref Range   Sodium 139 135 - 145 mmol/L   Potassium 3.7 3.5 - 5.1 mmol/L   Chloride 102 98 - 111 mmol/L   CO2 30 22 - 32 mmol/L   Glucose, Bld 185 (H) 70 - 99 mg/dL    Comment: Glucose reference range applies only to samples taken after fasting for at least 8 hours.   BUN 13 6 - 20 mg/dL   Creatinine, Ser 5.64 0.44 - 1.00 mg/dL   Calcium 9.5 8.9 - 33.2 mg/dL   Total Protein 6.9 6.5 - 8.1 g/dL   Albumin 3.0 (L) 3.5 - 5.0 g/dL   AST 41 15 - 41 U/L   ALT 38 0 - 44 U/L   Alkaline Phosphatase 70 38 - 126 U/L   Total Bilirubin 0.6 0.3 - 1.2 mg/dL   GFR, Estimated >95 >18 mL/min    Comment: (NOTE) Calculated using the CKD-EPI Creatinine Equation (2021)    Anion gap 7 5 - 15    Comment: Performed at [facility], [address] [place], [place] [address]  Glucose, capillary     Status: Abnormal   Collection Time: 02/17/21  4:05 AM  Result Value Ref Range   Glucose-Capillary 176 (H) 70 - 99 mg/dL    Comment: Glucose reference range  applies only to samples taken after fasting for at least 8 hours.  Glucose, capillary     Status: Abnormal    Collection Time: 02/17/21  7:45 AM  Result Value Ref Range   Glucose-Capillary 201 (H) 70 - 99 mg/dL    Comment: Glucose reference range applies only to samples taken after fasting for at least 8 hours.   Comment 1 Notify RN    Comment 2 Document in Chart   Glucose, capillary     Status: Abnormal   Collection Time: 02/17/21 11:31 AM  Result Value Ref Range   Glucose-Capillary 178 (H) 70 - 99 mg/dL    Comment: Glucose reference range applies only to samples taken after fasting for at least 8 hours.   Comment 1 Notify RN    Comment 2 Document in Chart   Glucose, capillary     Status: Abnormal   Collection Time: 02/17/21  4:42 PM  Result Value Ref Range   Glucose-Capillary 189 (H) 70 - 99 mg/dL    Comment: Glucose reference range applies only to samples taken after fasting for at least 8 hours.   Comment 1 Notify RN    Comment 2 Document in Chart   Glucose, capillary     Status: Abnormal   Collection Time: 02/17/21  7:42 PM  Result Value Ref Range   Glucose-Capillary 195 (H) 70 - 99 mg/dL    Comment: Glucose reference range applies only to samples taken after fasting for at least 8 hours.  Glucose, capillary     Status: Abnormal   Collection Time: 02/18/21 12:00 AM  Result Value Ref Range   Glucose-Capillary 187 (H) 70 - 99 mg/dL    Comment: Glucose reference range applies only to samples taken after fasting for at least 8 hours.  Magnesium     Status: None   Collection Time: 02/18/21  3:08 AM  Result Value Ref Range   Magnesium 2.2 1.7 - 2.4 mg/dL    Comment: Performed at [facility], [address] [place], [place] [address]  Phosphorus     Status: None   Collection Time: 02/18/21  3:08 AM  Result Value Ref Range   Phosphorus 4.0 2.5 - 4.6 mg/dL    Comment: Performed at [facility], [address], [place], [place] [address]  Glucose, capillary     Status: Abnormal   Collection Time: 02/18/21  4:14 AM  Result Value Ref Range   Glucose-Capillary 218 (H) 70 -  99 mg/dL    Comment: Glucose reference range applies only to samples taken after fasting for at least 8 hours.    No results found.  Review of Systems  Respiratory:  Negative for cough.   Cardiovascular:  Negative for chest pain.  Gastrointestinal:  Negative for abdominal pain, constipation, diarrhea, nausea and vomiting.  Neurological:  Negative for headaches.  Psychiatric/Behavioral:  Negative for agitation, behavioral problems, sleep disturbance and suicidal ideas. The patient is not nervous/anxious.   Blood pressure (!) 120/51, pulse 86, temperature 99.3 F (37.4 C), temperature source Oral, resp. rate 16, weight 71.4 kg, SpO2 97 %. Physical Exam Vitals and nursing note reviewed.  Constitutional:      General: She is not in acute distress.    Appearance: Normal appearance. She is not ill-appearing or toxic-appearing.  HENT:     Head: Normocephalic and atraumatic.  Pulmonary:     Effort: Pulmonary effort is normal.  Musculoskeletal:        General: Normal range  of motion.  Neurological:     Mental Status: She is alert.    02/18/21 0918  Presentation  General Appearance Appropriate for Environment  Eye Contact Fair  Speech Clear and Coherent;Slow  Speech Volume Normal  Mood and Affect  Mood Euthymic  Affect Congruent;Appropriate  Thought Processes  Thought Process Goal Directed;Coherent  Descriptions of Associations Intact  Orientation Full (Time, Place and Person)  Thought Content Logical;WDL  Hallucinations None  Ideas of Reference None  Suicidal Thoughts No  Homicidal Thoughts No  Sensorium  Memory Immediate Fair;Recent Fair  Judgment Poor  Insight Shallow  Executive Functions  Concentration Fair  Attention Span Fair  Recall Poor  Fund of Knowledge Poor  Language Poor  Psychomotor Activity  Psychomotor Activity Normal  Assets  Assets Resilience;Social Support;Housing  Sleep  Sleep Good     Assessment/Plan: Case and Plan discussed with Dr.  Lucianne Muss  She has responded very well to the Ativan.  She is now requesting to eat and get up and walk around the unit.  We will continue with the Ativan as it has significantly improved her.  Will not restart the Haldol or Remeron at this time as she is asking to eat.  Primary team has ordered an SLP evaluation and restarted the diet as appropriate.  She had already eaten some pudding and crackers, encouraged her to continue eating.  Will not make any medication changes at this time.  We will continue to monitor.     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 -Hold Remeron     Catatonia: -Continue Ativan 1 mg q6 PO if possible     Disposition: If accepted by [facility], she would be appropriate for services. If not, SNF placement upon discharge and outpatient psychiatric follow-up. Psychiatry will continue to follow during this admission.   Lauro Franklin 02/18/2021, 7:19 AM

## 2021-02-18 NOTE — TOC Progression Note (Signed)
Transition of Care [facility]) - Progression Note    Patient Details  Name: [name] MRN: [id number] Date of Birth: 1966-03-30  Transition of Care [facility]) CM/SW [place], RN Phone Number: 02/18/2021, 2:26 PM  Clinical Narrative:    CM met with the patient at the bedside and patient is now awake and alert and answering questions after medication management and adjustment by attending physicians.  The patient states that she would like to go home with her sister at a later date and attend Adult day care at [facility] when she is able to discharge home.  PT/OT is continuing to work with the patient for physical progression.  Erin Hearing, NP spoke with the patient's sister, [name] on the phone and she is agreeable to take the patient home once she is medically stable with probable outpatient follow up with PCP and psychiatry.  CM and MSW will continue to follow the patient for discharge needs to home at a later date once medically/ psychologically stable for discharge.  Patient continues to need progression with PT/OT and po intake.   Expected Discharge Plan: Home/Self Care Barriers to Discharge: Continued Medical Work up, Psych Bed not available  Expected Discharge Plan and Services Expected Discharge Plan: Home/Self Care In-house Referral: Clinical Social Work, PCP / Curator Services: CM Consult Post Acute Care Choice: Resumption of Svcs/PTA Provider Living arrangements for the past 2 months: Single Family Home                 DME Arranged:  (pending need after PT/OT evaluation)                     Social Determinants of Health (SDOH) Interventions    Readmission Risk Interventions No flowsheet data found.

## 2021-02-18 NOTE — Progress Notes (Signed)
Physical Therapy Treatment Patient Details Name: [name] MRN: [id number] DOB: [birth year] Today's Date: 02/18/2021    History of Present Illness 55 y.o. female admitted for neurological evaluation of suspected encephalopathy.  However, brain imaging was normal, EEG unremarkable, UA negative, urine drug screen negative, procalcitonin low, B12, ammonia, and TSH normal.  Acute metabolic encephalopathy was ruled out. Progressive mental decline over previous 2 days so another EEG and CT head performed 8/30 with normal results, no sign of seizures. Improving mental states 02/18/21. PMH: intellectual disability, bipolar disorder, seizure disorder, type II diabetes, hypertension, hyperlipidemia, immune deficiency disorder.    PT Comments    Pt received in supine, agreeable to therapy session and with good participation and fair tolerance for transfer training. Pt quick to fatigue and needs up to +2 modA for transfers and sidesteps along EOB using RW. Pt may do better with HHA next session as assistive device difficult to manage. Plan to initiate gait training with chair follow for safety next session. Pt with good progress toward goals this date. Pt continues to benefit from PT services to progress toward functional mobility goals.   Follow Up Recommendations  SNF     Equipment Recommendations  Other (comment) (TBA pending progress)    Recommendations for Other Services       Precautions / Restrictions Precautions Precautions: Fall Precaution Comments: G tube and abdominal binder in place. Restrictions Weight Bearing Restrictions: No    Mobility  Bed Mobility Overal bed mobility: Needs Assistance Bed Mobility: Supine to Sit;Rolling;Sit to Sidelying Rolling: Mod assist   Supine to sit: Mod assist;+2 for physical assistance   Sit to sidelying: Mod assist;+2 for physical assistance General bed mobility comments: pt follows cues with increased time    Transfers Overall transfer  level: Needs assistance Equipment used: Rolling walker (2 wheeled) Transfers: Sit to/from Stand Sit to Stand: Mod assist;+2 physical assistance         General transfer comment: from EOB<>RW, pt with posterior lean throughout needs consistent modA for safety; may do better next session with HHA instead?  Ambulation/Gait Ambulation/Gait assistance: Mod assist;+2 physical assistance Gait Distance (Feet): 3 Feet Assistive device: Rolling walker (2 wheeled) Gait Pattern/deviations: Step-to pattern;Decreased weight shift to left;Decreased step length - left;Shuffle;Leaning posteriorly;Narrow base of support     General Gait Details: pt having difficulty lifting LLE for sidesteps along EOB, pt pushing backward/posterior lean and difficulty maintaining BOS over RW; quick to fatigue   Stairs             Wheelchair Mobility    Modified Rankin (Stroke Patients Only)       Balance Overall balance assessment: Needs assistance Sitting-balance support: Feet supported Sitting balance-Leahy Scale: Fair   Postural control: Posterior lean Standing balance support: Bilateral upper extremity supported Standing balance-Leahy Scale: Poor Standing balance comment: requires external support +2 for safety, difficulty managing/utilizing RW properly                            Cognition Arousal/Alertness: Awake/alert Behavior During Therapy: WFL for tasks assessed/performed Overall Cognitive Status: History of cognitive impairments - at baseline                         Following Commands: Follows one step commands consistently Safety/Judgement: Decreased awareness of deficits;Decreased awareness of safety   Problem Solving: Requires verbal cues;Requires tactile cues;Slow processing General Comments: Increased alertness and very interactive, pt smiling and  participatory this date.      Exercises Other Exercises Other Exercises: seated BLE AROM: hip flexion, LAQ  x5-10 reps ea Other Exercises: supine BLE AROM: ankle pumps, heel slides x 10 reps ea    General Comments General comments (skin integrity, edema, etc.): VSS on RA      Pertinent Vitals/Pain      Home Living                      Prior Function            PT Goals (current goals can now be found in the care plan section) Acute Rehab PT Goals Patient Stated Goal: to get stronger PT Goal Formulation: Patient unable to participate in goal setting Time For Goal Achievement: 02/25/21 Progress towards PT goals: Progressing toward goals    Frequency    Min 2X/week      PT Plan Current plan remains appropriate    Co-evaluation              AM-PAC PT "6 Clicks" Mobility   Outcome Measure  Help needed turning from your back to your side while in a flat bed without using bedrails?: A Lot Help needed moving from lying on your back to sitting on the side of a flat bed without using bedrails?: A Lot Help needed moving to and from a bed to a chair (including a wheelchair)?: A Lot Help needed standing up from a chair using your arms (e.g., wheelchair or bedside chair)?: A Lot Help needed to walk in hospital room?: Total Help needed climbing 3-5 steps with a railing? : Total 6 Click Score: 10    End of Session Equipment Utilized During Treatment: Gait belt Activity Tolerance: Patient tolerated treatment well Patient left: in bed;with call bell/phone within reach;with bed alarm set;Other (comment) (sidelying to L, pillow between knees and behind R back) Nurse Communication: Mobility status PT Visit Diagnosis: Other abnormalities of gait and mobility (R26.89);Other symptoms and signs involving the nervous system (R29.898)     Time: 1761-6073 PT Time Calculation (min) (ACUTE ONLY): 16 min  Charges:  $Therapeutic Activity: 8-22 mins                     Darionna Banke P., PTA Acute Rehabilitation Services Pager: [phone] Office: [phone]    Angus Palms 02/18/2021, 6:49 PM

## 2021-02-18 NOTE — Evaluation (Addendum)
Clinical/Bedside Swallow Evaluation Patient Details  Name: [name] MRN: [id number] Date of Birth: [birth year]  Today's Date: 02/18/2021 Time: SLP Start Time (ACUTE ONLY): 2585 SLP Stop Time (ACUTE ONLY): 0952 SLP Time Calculation (min) (ACUTE ONLY): 16 min  Past Medical History:  Past Medical History:  Diagnosis Date   Diabetes mellitus without complication (HCC)    Hypertension    Immune deficiency disorder (HCC)    Mild mental retardation    Past Surgical History:  Past Surgical History:  Procedure Laterality Date   IR GASTROSTOMY TUBE MOD SED  02/14/2021   HPI:  55 y.o. female with medical history significant for intellectual disability, bipolar disorder, seizure disorder, type II diabetes, hypertension, hyperlipidemia, immune deficiency disorder presented to the ED 8/7 for evaluation of altered mental status. Dx suspected catatonia; brain imaging unremarkable.  PEG 8/29. Ethics team following.  PMH: intellectual disability, DM 2, HTN bipolar disorder. New swallow orders received 9/2 given changes in MS.   Assessment / Plan / Recommendation Clinical Impression  Pt presents with normal oropharyngeal swallow function.  She was communicative, smiling, eager to eat.  Demonstrated normal mastication of solids; the appearance of a brisk swallow response; no s/s of aspiration.  Recommend regular diet, thin liquids. No dysphagia. Our service will sign off. SLP Visit Diagnosis: Dysphagia, unspecified (R13.10)    Aspiration Risk  No limitations    Diet Recommendation   Regular solids; thin liquids.  Medication Administration: Whole meds with liquid    Other  Recommendations Oral Care Recommendations: Oral care BID   Follow up Recommendations None        Swallow Study   General HPI: 55 y.o. female with medical history significant for intellectual disability, bipolar disorder, seizure disorder, type II diabetes, hypertension, hyperlipidemia, immune deficiency disorder presented  to the ED 8/7 for evaluation of altered mental status. Dx suspected catatonia; brain imaging unremarkable.  PEG 8/29. Ethics team following.  PMH: intellectual disability, DM 2, HTN bipolar disorder. New swallow orders received 9/2 given changes in MS. Type of Study: Bedside Swallow Evaluation Diet Prior to this Study: Regular;Thin liquids Temperature Spikes Noted: No Respiratory Status: Room air History of Recent Intubation: No Behavior/Cognition: Alert;Cooperative;Pleasant mood Oral Cavity Assessment: Within Functional Limits Oral Care Completed by SLP: Yes Oral Cavity - Dentition: Adequate natural dentition Vision: Functional for self-feeding Self-Feeding Abilities: Able to feed self Patient Positioning: Upright in bed Baseline Vocal Quality: Normal Volitional Cough: Strong Volitional Swallow: Able to elicit    Oral/Motor/Sensory Function Overall Oral Motor/Sensory Function: Within functional limits   Ice Chips Ice chips: Within functional limits   Thin Liquid Thin Liquid: Within functional limits    Nectar Thick Nectar Thick Liquid: Not tested   Honey Thick Honey Thick Liquid: Not tested   Puree Puree: Within functional limits   Solid  [name] L. Marissa Frederic, MA CCC/SLP Acute Rehabilitation Services Office number [phone] Pager [phone]    Solid: Within functional limits      [name] [name] 02/18/2021,9:55 AM

## 2021-02-18 NOTE — Progress Notes (Addendum)
[facility] PROGRESS NOTE  [name] WUJ:[id number] DOB: [birth year] DOA: 01/23/2021 PCP: Marissa Mires, MD  Status: Remains inpatient appropriate because:Altered mental status, Unsafe d/c plan, IV treatments appropriate due to intensity of illness or inability to take PO, and Inpatient level of care appropriate due to severity of illness  Dispo: The patient is from: Home              Anticipated d/c is to: Home:               Patient currently is not medically stable to d/c.Marland Kitchen  Now that she has awakened need to ensure that patient can mobilize appropriately and navigate stairs before discharge home.   Difficult to place patient Yes   Level of care: Med-Surg  Code Status: Full Family Communication: Sister [name] 9/2 DVT prophylaxis: Lovenox COVID vaccination status: Unknown   HPI: 55 y.o. F with DM, HTN, developmental delay who presented with decreased oral intake and "saying bizarre things".   In the ER, CT head unremarkable.  She was hypoglycemic and otherwise had normal chemistries and hemogram.  Admitted for evaluation of erratic behavior.  Subjective: Patient very alert today and interactive.  Tells that she wishes to return home with her sister but [facility].  She is very communicative with the case manager who is at the bedside interacting with her.  She is requesting specific items to eat during the day.  Wants to know if she has to wear her mask while walking out in the halls.  Objective: Vitals:   02/18/21 0000 02/18/21 0414  BP: 107/82 (!) 120/51  Pulse: 89 86  Resp: 17 16  Temp: 98.1 F (36.7 C) 99.3 F (37.4 C)  SpO2: 97% 97%    Intake/Output Summary (Last 24 hours) at 02/18/2021 0737 Last data filed at 02/18/2021 0414 Gross per 24 hour  Intake 3737.25 ml  Output 1200 ml  Net 2537.25 ml   Filed Weights   02/16/21 0500 02/17/21 0430 02/18/21 0425  Weight: 73.3 kg 72.1 kg 71.4 kg    Exam:  Constitutional: NAD, calm,  alert, calm, talkative and interactive today Respiratory: Lungs CTA, stable on room air, no increased work of breathing Cardiovascular: Regular rate and rhythm, S1S2, No extremity edema.  Extremities warm to touch Abdomen: PEG tube placed 8/29. LBM 9/01 Neurologic: Cranial nerves II through XII are intact.  Patient moving all extremities equally with strength 4-5/5.  Sensation intact. Psychiatric: Alert and oriented x3.  No further catatonia symptoms.  Very interactive.   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  Brain imaging unremarkable.  EEG unremarkable.  UA negative, urine drug screen negative, procalcitonin low.  B12, ammonia, TSH normal. Acute metabolic encephalopathy ruled out. Psychiatry consulted and suspected recent change in environment precipitated development of patient's withdrawn state-recent symptoms felt to be related to catatonia. 9/1 Ativan initiated with significant improvement in symptoms and patient appears to be at her baseline as of 9/2 Because of lack of improvement during the hospitalization patient was only able to receive medications and food through a nasal tube.  Eventually family opted to pursue PEG tube which was placed on 8/29. As of 9/2 patient very awake and requesting diet so this has been initiated.  Speech has cleared from a swallowing standpoint. Recent altered mentation post PEG placement again likely behavioral but as a precaution Keppra dose decreased on 8/31.   Patient request  to discharge back to her sister's care and continue participating in the day program at [facility].  Poor oral intake 2/2 fixed delusions/Malnutrition/Obesity PEG tube placed on 8/29  Ethics team consulted who saw no ethical barrier to placing PEG tube and initiating feedings noting PEG tube can be easily removed once patient starts  eating again. As of 9/2 patient with significant improvement in mentation and appetite after Ativan initiated for catatonia.  Oral diet resumed.  Tube feedings changed to nocturnal.  If patient able to eat appropriate amounts we will discontinue nocturnal tube feedings Nutrition Problem: Inadequate oral intake Etiology: lethargy/confusion Signs/Symptoms: meal completion < 25%, per patient/family report Interventions: Tube feeding   Seizure disorder POA Continue keppra with dose decreased on 8/31 due to altered mentation  Physical deconditioning secondary to fixed delusion Although I was able to get the patient to weakly lift both legs she has told providers that she is unable to walk secondary to not having any legs PT and OT reevaluation requested given significant improvement in mentation and has to ambulate in halls   Diabetes melitis 2 uncontrolled on long-term insulin CBGs stable off of long-acting insulin at this juncture Continue Glucerna tube feeding 12 hours nocturnally Continue SSI correction-tolerates oral diet can transition to AC/HS glucometer checks  Hypokalemia Hypomagnesemia Resolved         Data Reviewed: Basic Metabolic Panel: Recent Labs  Lab 02/13/21 0809 02/15/21 0139 02/16/21 0322 02/17/21 0148 02/18/21 0308  NA  --  139 139 139  --   K  --  4.1 4.0 3.7  --   CL  --  105 103 102  --   CO2  --  26 25 30   --   GLUCOSE  --  124* 167* 185*  --   BUN  --  14 14 13   --   CREATININE  --  0.74 0.66 0.69  --   CALCIUM  --  9.7 9.6 9.5  --   MG 2.3 2.2 2.2 2.3 2.2  PHOS 4.0 3.7 3.5 3.3 4.0   Liver Function Tests: Recent Labs  Lab 02/15/21 0139 02/16/21 0322 02/17/21 0148  AST 48* 50* 41  ALT 39 46* 38  ALKPHOS 74 71 70  BILITOT 1.2 0.8 0.6  PROT 7.5 7.1 6.9  ALBUMIN 3.4* 3.2* 3.0*   No results for input(s): LIPASE, AMYLASE in the last 168 hours. No results for input(s): AMMONIA in the last 168 hours. CBC: Recent Labs  Lab 02/15/21 0139  02/16/21 0322 02/17/21 0148  WBC 14.5* 10.3 9.3  NEUTROABS 8.7* 5.6 5.8  HGB 12.6 11.7* 11.7*  HCT 39.0 37.1 36.6  MCV 92.6 93.5 92.4  PLT 420* 395 394   Cardiac Enzymes: No results for input(s): CKTOTAL, CKMB, CKMBINDEX, TROPONINI in the last 168 hours. BNP (last 3 results) Recent Labs    11/01/20 0405 11/02/20 0350 11/03/20 0155  BNP 15.0 8.6 6.0    ProBNP (last 3 results) No results for input(s): PROBNP in the last 8760 hours.  CBG: Recent Labs  Lab 02/17/21 1131 02/17/21 1642 02/17/21 1942 02/18/21 0000 02/18/21 0414  GLUCAP 178* 189* 195* 187* 218*    No results found for this or any previous visit (from the past 240 hour(s)).   Studies: No results found.  Scheduled Meds:  benztropine  1 mg Per Tube Daily   enoxaparin (LOVENOX) injection  40 mg Subcutaneous Q24H   free water  100 mL Per Tube Q4H   insulin aspart  0-15  Units Subcutaneous Q4H   levETIRAcetam  750 mg Per Tube BID   LORazepam  1 mg Per Tube Q6H   multivitamin with minerals  1 tablet Per Tube Daily   valACYclovir  500 mg Per Tube QHS   Continuous Infusions:  feeding supplement (GLUCERNA 1.2 CAL) 1,000 mL (02/17/21 1509)    Principal Problem:   Acute encephalopathy Active Problems:   Hypoglycemia   Constipation   Bipolar disorder (HCC)   Diabetes (HCC)   AMS (altered mental status)   Poor appetite   Psychogenic loss of appetite   Intellectual delay   Consultants: Neurology Psychiatry  Procedures: EEG Long-term EEG  Antibiotics: Valacyclovir 8/15 through 8/28   Time spent: 5 minutes    Junious Silk ANP  [facility] 7 am - 330 pm/M-F for direct patient care and secure chat Please refer to Amion for contact info 24  days

## 2021-02-18 NOTE — Progress Notes (Signed)
Occupational Therapy Treatment Patient Details Name: [name] MRN: [id number] DOB: August 06, 1965 Today's Date: 02/18/2021    History of present illness 55 y.o. female admitted for neurological evaluation of suspected encephalopathy.  However, brain imaging was normal, EEG unremarkable, UA negative, urine drug screen negative, procalcitonin low, B12, ammonia, and TSH normal.  Acute metabolic encephalopathy was ruled out. Progressive mental decline over previous 2 days so another EEG and CT head performed 8/30 with normal results, no sign of seizures. PMH: intellectual disability, bipolar disorder, seizure disorder, type II diabetes, hypertension, hyperlipidemia, immune deficiency disorder.   OT comments  Patient with good progress toward goals this date.  She is significantly more interactive and alert this date.  Patient able to perform basic mobility with Mod A of 1, and is sitting up eating with setup.  Basic grooming with setup and verbal cues for thoroughness.  Acute OT to continue efforts to maximize her functional status, and help transition to SNF for post acute rehab.    Follow Up Recommendations  SNF    Equipment Recommendations  3 in 1 bedside commode;Tub/shower seat    Recommendations for Other Services      Precautions / Restrictions Precautions Precautions: Fall Precaution Comments: G tube and abdominal binder in place. Restrictions Weight Bearing Restrictions: No Other Position/Activity Restrictions: Increased alertness and very interactive       Mobility Bed Mobility Overal bed mobility: Needs Assistance Bed Mobility: Supine to Sit     Supine to sit: Mod assist       Patient Response: Cooperative  Transfers Overall transfer level: Needs assistance Equipment used: Rolling walker (2 wheeled) Transfers: Sit to/from [employer] Sit to Stand: Mod assist Stand pivot transfers: Mod assist       General transfer comment: HHA    Balance  Overall balance assessment: Needs assistance Sitting-balance support: Feet supported Sitting balance-Leahy Scale: Fair   Postural control: Posterior lean Standing balance support: Bilateral upper extremity supported Standing balance-Leahy Scale: Poor Standing balance comment: requires external support - RW attempted                           ADL either performed or assessed with clinical judgement   ADL   Eating/Feeding: Set up;Sitting   Grooming: Wash/dry face;Wash/dry hands;Min guard;Sitting                               Functional mobility during ADLs: Moderate assistance General ADL Comments: HHA     Vision Baseline Vision/History: 0 No visual deficits Patient Visual Report: No change from baseline     Perception     Praxis      Cognition Arousal/Alertness: Awake/alert Behavior During Therapy: WFL for tasks assessed/performed Overall Cognitive Status: History of cognitive impairments - at baseline                         Following Commands: Follows one step commands consistently Safety/Judgement: Decreased awareness of deficits;Decreased awareness of safety   Problem Solving: Decreased initiation;Requires verbal cues                            Pertinent Vitals/ Pain       Pain Assessment: No/denies pain Pain Intervention(s): Monitored during session  Frequency  Min 2X/week        Progress Toward Goals  OT Goals(current goals can now be found in the care plan section)  Progress towards OT goals: Progressing toward goals  Acute Rehab OT Goals Patient Stated Goal: I want Jello OT Goal Formulation: With patient Time For Goal Achievement: 02/25/21 Potential to Achieve Goals: Good  Plan Discharge plan remains appropriate    Co-evaluation                 AM-PAC OT "6 Clicks" Daily Activity     Outcome Measure   Help  from another person eating meals?: None Help from another person taking care of personal grooming?: A Little Help from another person toileting, which includes using toliet, bedpan, or urinal?: A Lot Help from another person bathing (including washing, rinsing, drying)?: A Lot Help from another person to put on and taking off regular upper body clothing?: A Little Help from another person to put on and taking off regular lower body clothing?: A Lot 6 Click Score: 16    End of Session    OT Visit Diagnosis: Other symptoms and signs involving cognitive function;Muscle weakness (generalized) (M62.81)   Activity Tolerance Patient tolerated treatment well   Patient Left in chair;with call bell/phone within reach;with chair alarm set   Nurse Communication Mobility status        Time: 0258-5277 OT Time Calculation (min): 23 min  Charges: OT General Charges $OT Visit: 1 Visit OT Treatments $Self Care/Home Management : 23-37 mins  02/18/2021  RP, OTR/L  Acute Rehabilitation Services  Office:  [phone]    Suzanna Obey 02/18/2021, 11:03 AM

## 2021-02-18 NOTE — Progress Notes (Signed)
Pt had a good day, more alert, still slightly slurred speech. Speech eval was done and new orders were placed. Pt feeding herself, slowly but without a complications or coughing.PT/OT done. Pt is very weak. Left hand almost contracted, weak, with limited movements. Pt sat in the chair during meals. UO/Purewick is working well. Friend came to visit today. Report to upcoming nurse was given, bed in low positions, alarms are on, padded, call bell in reach

## 2021-02-19 DIAGNOSIS — E876 Hypokalemia: Secondary | ICD-10-CM | POA: Diagnosis not present

## 2021-02-19 LAB — GLUCOSE, CAPILLARY
Glucose-Capillary: 113 mg/dL — ABNORMAL HIGH (ref 70–99)
Glucose-Capillary: 158 mg/dL — ABNORMAL HIGH (ref 70–99)
Glucose-Capillary: 170 mg/dL — ABNORMAL HIGH (ref 70–99)
Glucose-Capillary: 181 mg/dL — ABNORMAL HIGH (ref 70–99)
Glucose-Capillary: 203 mg/dL — ABNORMAL HIGH (ref 70–99)
Glucose-Capillary: 206 mg/dL — ABNORMAL HIGH (ref 70–99)

## 2021-02-19 LAB — MAGNESIUM: Magnesium: 2.2 mg/dL (ref 1.7–2.4)

## 2021-02-19 LAB — PHOSPHORUS: Phosphorus: 3.8 mg/dL (ref 2.5–4.6)

## 2021-02-19 MED ORDER — LEVETIRACETAM 100 MG/ML PO SOLN
1000.0000 mg | Freq: Two times a day (BID) | ORAL | Status: AC
  Administered 2021-02-19 – 2021-02-23 (×9): 1000 mg
  Filled 2021-02-19 (×10): qty 10

## 2021-02-19 MED ORDER — WHITE PETROLATUM EX OINT
TOPICAL_OINTMENT | CUTANEOUS | Status: AC
  Filled 2021-02-19: qty 28.35

## 2021-02-19 MED ORDER — LORAZEPAM 1 MG PO TABS
1.0000 mg | ORAL_TABLET | Freq: Three times a day (TID) | ORAL | Status: DC
  Administered 2021-02-19 (×2): 1 mg
  Filled 2021-02-19 (×2): qty 1

## 2021-02-19 MED ORDER — HALOPERIDOL LACTATE 5 MG/ML IJ SOLN: 1.0000 mg | Freq: Four times a day (QID) | INTRAMUSCULAR | Status: DC | PRN

## 2021-02-19 MED ORDER — BENZTROPINE MESYLATE 0.5 MG PO TABS
0.5000 mg | ORAL_TABLET | Freq: Every day | ORAL | Status: DC
  Administered 2021-02-20 – 2021-02-23 (×4): 0.5 mg
  Filled 2021-02-19 (×5): qty 1

## 2021-02-19 NOTE — Progress Notes (Signed)
[facility] PROGRESS NOTE  [name] VFI:[id number] DOB: 07/28/1965 DOA: 01/23/2021 PCP: Mirna Mires, MD  Status: Remains inpatient appropriate because:Altered mental status, Unsafe d/c plan, IV treatments appropriate due to intensity of illness or inability to take PO, and Inpatient level of care appropriate due to severity of illness  Dispo: The patient is from: Home              Anticipated d/c is to: Home:               Patient currently is not medically stable to d/c.Marland Kitchen  Now that she has awakened need to ensure that patient can mobilize appropriately and navigate stairs before discharge home.   Difficult to place patient Yes   Level of care: Med-Surg  Code Status: Full Family Communication: Sister [name] 9/2 DVT prophylaxis: Lovenox COVID vaccination status: Unknown   HPI: 55 y.o. F with DM, HTN, developmental delay who presented with decreased oral intake and "saying bizarre things".   In the ER, CT head unremarkable.  She was hypoglycemic and otherwise had normal chemistries and hemogram.  Admitted for evaluation of erratic behavior.  Subjective: Patient in bed appears to be in no distress, unfortunately she is less responsive today and unable to answer any questions.  Objective: Vitals:   02/19/21 0815 02/19/21 1126  BP: (!) 140/91 131/90  Pulse: (!) 109 97  Resp: 20 17  Temp: (!) 97.5 F (36.4 C) 98.6 F (37 C)  SpO2: 100% 100%    Intake/Output Summary (Last 24 hours) at 02/19/2021 1150 Last data filed at 02/19/2021 1000 Gross per 24 hour  Intake 1842.58 ml  Output 1100 ml  Net 742.58 ml   Filed Weights   02/17/21 0430 02/18/21 0425 02/19/21 0500  Weight: 72.1 kg 71.4 kg 73.7 kg    Exam:  Awake but less responsive today, moves all 4 extremities to painful stimuli only but not on commands, PEG tube in place, [place].AT,PERRAL Supple Neck,No JVD, No cervical lymphadenopathy appriciated.  Symmetrical Chest wall movement, Good air movement bilaterally,  CTAB RRR,No Gallops, Rubs or new Murmurs, No Parasternal Heave +ve B.Sounds, Abd Soft, No tenderness, No organomegaly appriciated, No rebound - guarding or rigidity. 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  Brain imaging unremarkable.  EEG unremarkable.  UA negative, urine drug screen negative, procalcitonin low.  B12, ammonia, TSH normal. Acute metabolic encephalopathy ruled out.  She was seen and cleared by neurology, she was also seen by psychiatry.  Due to her poor oral intake PEG tube was placed and she has been started on PEG tube feeds.  She was initiated on Ativan by psychiatry on 02/18/2021 after which her mental status improved however she continues to have some waxing and waning features, on 02/19/2021 she is less responsive, case discussed with psychiatrist Dr.Akintoyo, medications adjusted further on 02/19/2021 including Cogentin, Haldol and Ativan.  Will await response and continue to monitor.  Sustained improvement likely discharge back to patient's sister versus SNF depending on her condition at that standpoint.  Poor oral intake 2/2 fixed delusions/Malnutrition/Obesity PEG tube placed on 8/29, ethics team consulted and PEG tube was placed, if mental status show sustained improvement and she is taking oral diet on a continued basis PEG tube can be discontinued after 4 to 6 weeks after the tract has matured.  Nutrition Problem: Inadequate oral intake  Etiology: lethargy/confusion Signs/Symptoms: meal completion < 25%, per patient/family report Interventions: Tube feeding   Seizure disorder POA Continue keppra at home dose levels are stable.  Physical deconditioning secondary to fixed delusion Although I was able to get the patient to weakly lift both legs she has told providers that she  is unable to walk secondary to not having any legs, PT - OT following   Diabetes melitis 2 uncontrolled on long-term insulin CBGs stable off of long-acting insulin at this juncture Continue Glucerna tube feeding 12 hours nocturnally Continue SSI correction-tolerates oral diet can transition to AC/HS glucometer checks   Lab Results  Component Value Date   HGBA1C 6.4 (H) 01/23/2021    CBG (last 3)  Recent Labs    02/19/21 0323 02/19/21 0818 02/19/21 1129  GLUCAP 206* 158* 181*       Data Reviewed: Basic Metabolic Panel: Recent Labs  Lab 02/15/21 0139 02/16/21 0322 02/17/21 0148 02/18/21 0308 02/19/21 0313  NA 139 139 139  --   --   K 4.1 4.0 3.7  --   --   CL 105 103 102  --   --   CO2 26 25 30   --   --   GLUCOSE 124* 167* 185*  --   --   BUN 14 14 13   --   --   CREATININE 0.74 0.66 0.69  --   --   CALCIUM 9.7 9.6 9.5  --   --   MG 2.2 2.2 2.3 2.2 2.2  PHOS 3.7 3.5 3.3 4.0 3.8   Liver Function Tests: Recent Labs  Lab 02/15/21 0139 02/16/21 0322 02/17/21 0148  AST 48* 50* 41  ALT 39 46* 38  ALKPHOS 74 71 70  BILITOT 1.2 0.8 0.6  PROT 7.5 7.1 6.9  ALBUMIN 3.4* 3.2* 3.0*   No results for input(s): LIPASE, AMYLASE in the last 168 hours. No results for input(s): AMMONIA in the last 168 hours. CBC: Recent Labs  Lab 02/15/21 0139 02/16/21 0322 02/17/21 0148  WBC 14.5* 10.3 9.3  NEUTROABS 8.7* 5.6 5.8  HGB 12.6 11.7* 11.7*  HCT 39.0 37.1 36.6  MCV 92.6 93.5 92.4  PLT 420* 395 394   Cardiac Enzymes: No results for input(s): CKTOTAL, CKMB, CKMBINDEX, TROPONINI in the last 168 hours. BNP (last 3 results) Recent Labs    11/01/20 0405 11/02/20 0350 11/03/20 0155  BNP 15.0 8.6 6.0    ProBNP (last 3 results) No results for input(s): PROBNP in the last 8760 hours.  CBG: Recent Labs  Lab 02/18/21 1935 02/18/21 2345 02/19/21 0323 02/19/21 0818 02/19/21 1129  GLUCAP 128* 158* 206* 158* 181*    No results found for this or any previous visit  (from the past 240 hour(s)).   Studies: No results found.  Scheduled Meds:  [START ON 02/20/2021] benztropine  0.5 mg Per Tube Daily   enoxaparin (LOVENOX) injection  40 mg Subcutaneous Q24H   free water  100 mL Per Tube Q4H   insulin aspart  0-15 Units Subcutaneous Q4H   levETIRAcetam  1,000 mg Per Tube BID   LORazepam  1 mg Per Tube TID   multivitamin with minerals  1 tablet Per Tube Daily   valACYclovir  500 mg Per Tube QHS   Continuous Infusions:  feeding supplement (GLUCERNA 1.2 CAL) 1,000 mL (02/18/21 2104)    Principal Problem:   Acute encephalopathy Active Problems:   Hypoglycemia   Constipation   Bipolar disorder (HCC)   Diabetes (HCC)  AMS (altered mental status)   Poor appetite   Psychogenic loss of appetite   Intellectual delay   Consultants: Neurology Psychiatry  Procedures: EEG Long-term EEG  Antibiotics: Valacyclovir 8/15 through 8/28  Time spent: 25 minutes 25  days  Signature  Susa Raring M.D on 02/19/2021 at 11:51 AM   -  To page go to [email or web address]

## 2021-02-20 DIAGNOSIS — E876 Hypokalemia: Secondary | ICD-10-CM | POA: Diagnosis not present

## 2021-02-20 LAB — COMPREHENSIVE METABOLIC PANEL
ALT: 58 U/L — ABNORMAL HIGH (ref 0–44)
AST: 59 U/L — ABNORMAL HIGH (ref 15–41)
Albumin: 2.9 g/dL — ABNORMAL LOW (ref 3.5–5.0)
Alkaline Phosphatase: 71 U/L (ref 38–126)
Anion gap: 6 (ref 5–15)
BUN: 12 mg/dL (ref 6–20)
CO2: 28 mmol/L (ref 22–32)
Calcium: 9.4 mg/dL (ref 8.9–10.3)
Chloride: 101 mmol/L (ref 98–111)
Creatinine, Ser: 0.59 mg/dL (ref 0.44–1.00)
GFR, Estimated: >60 mL/min (ref >60)
Glucose, Bld: 182 mg/dL — ABNORMAL HIGH (ref 70–99)
Potassium: 4.1 mmol/L (ref 3.5–5.1)
Sodium: 135 mmol/L (ref 135–145)
Total Bilirubin: <0.1 mg/dL — ABNORMAL LOW (ref 0.3–1.2)
Total Protein: 6.4 g/dL — ABNORMAL LOW (ref 6.5–8.1)

## 2021-02-20 LAB — GLUCOSE, CAPILLARY
Glucose-Capillary: 158 mg/dL — ABNORMAL HIGH (ref 70–99)
Glucose-Capillary: 170 mg/dL — ABNORMAL HIGH (ref 70–99)
Glucose-Capillary: 189 mg/dL — ABNORMAL HIGH (ref 70–99)
Glucose-Capillary: 142 mg/dL — ABNORMAL HIGH (ref 70–99)
Glucose-Capillary: 220 mg/dL — ABNORMAL HIGH (ref 70–99)

## 2021-02-20 LAB — CBC
HCT: 36.1 % (ref 36.0–46.0)
Hemoglobin: 11.4 g/dL — ABNORMAL LOW (ref 12.0–15.0)
MCH: 29 pg (ref 26.0–34.0)
MCHC: 31.6 g/dL (ref 30.0–36.0)
MCV: 91.9 fL (ref 80.0–100.0)
Platelets: 381 10*3/uL (ref 150–400)
RBC: 3.93 MIL/uL (ref 3.87–5.11)
RDW: 12.3 % (ref 11.5–15.5)
WBC: 9.3 10*3/uL (ref 4.0–10.5)
nRBC: 0 % (ref 0.0–0.2)

## 2021-02-20 LAB — TSH: TSH: 2.027 u[IU]/mL (ref 0.350–4.500)

## 2021-02-20 LAB — PHOSPHORUS: Phosphorus: 4.2 mg/dL (ref 2.5–4.6)

## 2021-02-20 LAB — MAGNESIUM: Magnesium: 2.2 mg/dL (ref 1.7–2.4)

## 2021-02-20 MED ORDER — LORAZEPAM 0.5 MG PO TABS
0.5000 mg | ORAL_TABLET | Freq: Three times a day (TID) | ORAL | Status: DC
  Administered 2021-02-20 – 2021-02-22 (×9): 0.5 mg
  Filled 2021-02-20 (×9): qty 1

## 2021-02-20 NOTE — Progress Notes (Signed)
[facility] PROGRESS NOTE  [name] YQM:[id number] DOB: [birth year] DOA: 01/23/2021 PCP: Mirna Mires, MD  Status: Remains inpatient appropriate because:Altered mental status, Unsafe d/c plan, IV treatments appropriate due to intensity of illness or inability to take PO, and Inpatient level of care appropriate due to severity of illness  Dispo: The patient is from: Home              Anticipated d/c is to: Home:               Patient currently is not medically stable to d/c.Marland Kitchen  Now that she has awakened need to ensure that patient can mobilize appropriately and navigate stairs before discharge home.   Difficult to place patient Yes   Level of care: Med-Surg  Code Status: Full Family Communication: Sister [name] 9/2 DVT prophylaxis: Lovenox COVID vaccination status: Unknown   HPI: 55 y.o. F with DM, HTN, developmental delay who presented with decreased oral intake and "saying bizarre things".   In the ER, CT head unremarkable.  She was hypoglycemic and otherwise had normal chemistries and hemogram.  Admitted for evaluation of erratic behavior.  Subjective:  Patient in bed in no distress, she is awake today, improved as compared to yesterday but still overall somnolent, denies any headache chest or abdominal pain.  Objective: Vitals:   02/20/21 0432 02/20/21 0831  BP: 112/62 106/75  Pulse: 74 93  Resp: 18 17  Temp: 98.1 F (36.7 C) 98.2 F (36.8 C)  SpO2: 98% 97%    Intake/Output Summary (Last 24 hours) at 02/20/2021 0847 Last data filed at 02/20/2021 0700 Gross per 24 hour  Intake 1965 ml  Output 1450 ml  Net 515 ml   Filed Weights   02/18/21 0425 02/19/21 0500 02/20/21 0432  Weight: 71.4 kg 73.7 kg 73.5 kg    Exam:  Awake and more responsive as compared to what she was on 02/19/2021, moving all 4 extremities to command, still not fully alert and not talking much, [place].AT,PERRAL Supple Neck,No JVD, No cervical lymphadenopathy appriciated.  Symmetrical Chest  wall movement, Good air movement bilaterally, CTAB RRR,No Gallops, Rubs or new Murmurs, No Parasternal Heave +ve B.Sounds, Abd Soft, No tenderness, No organomegaly appriciated, No rebound - guarding or rigidity. 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  Brain imaging unremarkable.  EEG unremarkable.  UA negative, urine drug screen negative, procalcitonin low.  B12, ammonia, TSH normal. Acute metabolic encephalopathy ruled out.  She was seen and cleared by neurology, she was also seen by psychiatry.  Due to her poor oral intake PEG tube was placed and she has been started on PEG tube feeds.  She was initiated on Ativan by psychiatry on 02/18/2021 after which her mental status improved however she continues to have some waxing and waning features, on 02/19/2021 she is less responsive and appears somewhat sedated, case discussed with psychiatrist Dr.Akintoyo, medications adjusted further on 02/19/2021 including Cogentin, Haldol and Ativan.  With reduction in Ativan dose on 02/19/2021 she is more arousable on 02/20/2021 as compared to what she was on 02/19/2021.  Will drop Ativan dose slightly further and see if we can find an appropriate dose for her.  If she shows sustained improvement in her mental status and functional status she could discharge back to patient's sister versus SNF depending on her condition in  the next few days and her final baseline  Poor oral intake 2/2 fixed delusions/Malnutrition/Obesity - PEG tube placed on 8/29, ethics team consulted and PEG tube was placed, if mental status show sustained improvement and she is taking oral diet on a continued basis PEG tube can be discontinued after 4 to 6 weeks after the tract has matured.  Nutrition Problem: Inadequate oral intake Etiology:  lethargy/confusion Signs/Symptoms: meal completion < 25%, per patient/family report Interventions: Tube feeding   Seizure disorder POA - Continue keppra at home dose levels are stable.  Physical deconditioning secondary to fixed delusion - limited by catatonia and delusions, once mentation is better she does walk with PT and did that on 02/18/21, PT - OT following, placement will be based on her progression and functional status.   Mild asymptomatic transaminitis.  Stable total bilirubin, stable alkaline phosphatase.  Monitor.    Diabetes melitis 2 uncontrolled on long-term insulin -  on SSI, monitor closely with inconsistent oral intake and for now  Lab Results  Component Value Date   HGBA1C 6.4 (H) 01/23/2021  CBG (last 3)  Recent Labs    02/19/21 2354 02/20/21 0432 02/20/21 0835  GLUCAP 170* 158* 170*       Data Reviewed: Basic Metabolic Panel: Recent Labs  Lab 02/15/21 0139 02/16/21 0322 02/17/21 0148 02/18/21 0308 02/19/21 0313 02/20/21 0129  NA 139 139 139  --   --  135  K 4.1 4.0 3.7  --   --  4.1  CL 105 103 102  --   --  101  CO2 26 25 30   --   --  28  GLUCOSE 124* 167* 185*  --   --  182*  BUN 14 14 13   --   --  12  CREATININE 0.74 0.66 0.69  --   --  0.59  CALCIUM 9.7 9.6 9.5  --   --  9.4  MG 2.2 2.2 2.3 2.2 2.2 2.2  PHOS 3.7 3.5 3.3 4.0 3.8 4.2   Liver Function Tests: Recent Labs  Lab 02/15/21 0139 02/16/21 0322 02/17/21 0148 02/20/21 0129  AST 48* 50* 41 59*  ALT 39 46* 38 58*  ALKPHOS 74 71 70 71  BILITOT 1.2 0.8 0.6 <0.1*  PROT 7.5 7.1 6.9 6.4*  ALBUMIN 3.4* 3.2* 3.0* 2.9*   No results for input(s): LIPASE, AMYLASE in the last 168 hours. No results for input(s): AMMONIA in the last 168 hours. CBC: Recent Labs  Lab 02/15/21 0139 02/16/21 0322 02/17/21 0148 02/20/21 0129  WBC 14.5* 10.3 9.3 9.3  NEUTROABS 8.7* 5.6 5.8  --   HGB 12.6 11.7* 11.7* 11.4*  HCT 39.0 37.1 36.6 36.1  MCV 92.6 93.5 92.4 91.9  PLT 420* 395 394 381    Cardiac Enzymes: No results for input(s): CKTOTAL, CKMB, CKMBINDEX, TROPONINI in the last 168 hours. BNP (last 3 results) Recent Labs    11/01/20 0405 11/02/20 0350 11/03/20 0155  BNP 15.0 8.6 6.0    ProBNP (last 3 results) No results for input(s): PROBNP in the last 8760 hours.  CBG: Recent Labs  Lab 02/19/21 1626 02/19/21 2034 02/19/21 2354 02/20/21 0432 02/20/21 0835  GLUCAP 113* 203* 170* 158* 170*    No results found for this or any previous visit (from the past 240 hour(s)).   Studies: No results found.  Scheduled Meds:  benztropine  0.5 mg Per Tube Daily   enoxaparin (LOVENOX) injection  40 mg Subcutaneous Q24H   free water  100  mL Per Tube Q4H   insulin aspart  0-15 Units Subcutaneous Q4H   levETIRAcetam  1,000 mg Per Tube BID   LORazepam  1 mg Per Tube TID   multivitamin with minerals  1 tablet Per Tube Daily   valACYclovir  500 mg Per Tube QHS   Continuous Infusions:  feeding supplement (GLUCERNA 1.2 CAL) 1,000 mL (02/20/21 0414)    Principal Problem:   Acute encephalopathy Active Problems:   Hypoglycemia   Constipation   Bipolar disorder (HCC)   Diabetes (HCC)   AMS (altered mental status)   Poor appetite   Psychogenic loss of appetite   Intellectual delay   Consultants: Neurology Psychiatry  Procedures: EEG Long-term EEG  Antibiotics: Valacyclovir 8/15 through 8/28  Time spent: 25 minutes 26  days  Signature  Susa Raring M.D on 02/20/2021 at 8:47 AM   -  To page go to [email or web address]

## 2021-02-21 DIAGNOSIS — E876 Hypokalemia: Secondary | ICD-10-CM | POA: Diagnosis not present

## 2021-02-21 LAB — GLUCOSE, CAPILLARY
Glucose-Capillary: 132 mg/dL — ABNORMAL HIGH (ref 70–99)
Glucose-Capillary: 171 mg/dL — ABNORMAL HIGH (ref 70–99)
Glucose-Capillary: 195 mg/dL — ABNORMAL HIGH (ref 70–99)
Glucose-Capillary: 205 mg/dL — ABNORMAL HIGH (ref 70–99)
Glucose-Capillary: 216 mg/dL — ABNORMAL HIGH (ref 70–99)
Glucose-Capillary: 233 mg/dL — ABNORMAL HIGH (ref 70–99)
Glucose-Capillary: 238 mg/dL — ABNORMAL HIGH (ref 70–99)

## 2021-02-21 MED ORDER — LIP MEDEX EX OINT
TOPICAL_OINTMENT | CUTANEOUS | Status: DC | PRN
  Administered 2021-02-21: 1 via TOPICAL
  Filled 2021-02-21: qty 7

## 2021-02-21 NOTE — Progress Notes (Signed)
Physical Therapy Treatment Patient Details Name: [name] MRN: [id number] DOB: [birth year] Today's Date: 02/21/2021    History of Present Illness 55 y.o. female admitted for neurological evaluation of suspected encephalopathy.  However, brain imaging was normal, EEG unremarkable, UA negative, urine drug screen negative, procalcitonin low, B12, ammonia, and TSH normal.  Acute metabolic encephalopathy was ruled out. Progressive mental decline over previous 2 days so another EEG and CT head performed 8/30 with normal results, no sign of seizures. Improving mental status 02/18/21. PMH: intellectual disability, bipolar disorder, seizure disorder, type II diabetes, hypertension, hyperlipidemia, immune deficiency disorder.    PT Comments    Pt received in supine, pleasantly participatory and with good tolerance for bed mobility and transfer training. Pt with improved balance this date needing min/modA for transfers with 1-person HHA. Pt apprehensive to eat dinner which arrived during session but with encouragement from PTA and RN, pt agreeable to eat some bites. Pt up in chair with alarm on for safety at end of session. Plan to progress gait distance next session with chair follow for safety, continue to recommend SNF as per family she has steps at home and would need to get around without physical assist.  Follow Up Recommendations  SNF     Equipment Recommendations  Other (comment) (TBA pending progress)    Recommendations for Other Services       Precautions / Restrictions Precautions Precautions: Fall Precaution Comments: G tube and abdominal binder in place. Restrictions Weight Bearing Restrictions: No    Mobility  Bed Mobility Overal bed mobility: Needs Assistance Bed Mobility: Rolling;Sidelying to Sit Rolling: Min assist Sidelying to sit: Min assist;HOB elevated       General bed mobility comments: pt follows cues with increased time, use of bed rail to perform, able to  assist well today with scooting BLE toward EOB after rolling    Transfers Overall transfer level: Needs assistance Equipment used: 1 person hand held assist Transfers: Sit to/from [employer] Transfers Sit to Stand: Mod assist;Min assist Stand pivot transfers: Min assist       General transfer comment: Pt did better today with 1 person HHA, continues to demonstrate some posterior lean but not as bad with HHA. Small shuffled steps to chair.       Balance Overall balance assessment: Needs assistance Sitting-balance support: Feet supported Sitting balance-Leahy Scale: Fair   Postural control: Posterior lean Standing balance support: Single extremity supported Standing balance-Leahy Scale: Poor Standing balance comment: posterior lean, needs at least minA for stability with U HHA (seems to do better with HHA than with assistive device, possibly due to cognition).          Cognition Arousal/Alertness: Awake/alert Behavior During Therapy: WFL for tasks assessed/performed Overall Cognitive Status: History of cognitive impairments - at baseline Area of Impairment: Memory;Safety/judgement;Awareness;Problem solving        Current Attention Level: Sustained Memory: Decreased recall of precautions;Decreased short-term memory Following Commands: Follows one step commands consistently;Follows one step commands with increased time Safety/Judgement: Decreased awareness of deficits;Decreased awareness of safety Awareness: Intellectual Problem Solving: Requires verbal cues;Requires tactile cues;Slow processing General Comments: Pt initially sleeping but easily awoken and then interactive, pt smiling and participatory this date.      Exercises Other Exercises Other Exercises: standing BLE AROM: hip flexion x10 reps Other Exercises: STS x3 reps for strengthening    General Comments General comments (skin integrity, edema, etc.): VSS on RA, no dizziness reported; pt hesitant to  eat dinner but with assist  to cut up food, agreeable to take some bites (RN notified she needs encouragement to eat and notified that she says she likes lasagna)      Pertinent Vitals/Pain Pain Assessment: No/denies pain Pain Intervention(s): Monitored during session;Repositioned     PT Goals (current goals can now be found in the care plan section) Acute Rehab PT Goals Patient Stated Goal: to get stronger PT Goal Formulation: Patient unable to participate in goal setting Time For Goal Achievement: 02/25/21 Progress towards PT goals: Progressing toward goals    Frequency    Min 2X/week      PT Plan Current plan remains appropriate       AM-PAC PT "6 Clicks" Mobility   Outcome Measure  Help needed turning from your back to your side while in a flat bed without using bedrails?: A Little Help needed moving from lying on your back to sitting on the side of a flat bed without using bedrails?: A Little Help needed moving to and from a bed to a chair (including a wheelchair)?: A Lot Help needed standing up from a chair using your arms (e.g., wheelchair or bedside chair)?: A Lot Help needed to walk in hospital room?: Total Help needed climbing 3-5 steps with a railing? : Total 6 Click Score: 12    End of Session Equipment Utilized During Treatment: Gait belt;Other (comment) (abdominal binder) Activity Tolerance: Patient tolerated treatment well Patient left: with call bell/phone within reach;Other (comment);in chair;with chair alarm set (set up to eat dinner) Nurse Communication: Mobility status;Other (comment) (recommend +2 for safety due to cognition waxing/waning) PT Visit Diagnosis: Other abnormalities of gait and mobility (R26.89);Other symptoms and signs involving the nervous system (R29.898)     Time: 6967-8938 PT Time Calculation (min) (ACUTE ONLY): 19 min  Charges:  $Therapeutic Activity: 8-22 mins                     [name] P., PTA Acute Rehabilitation  Services Pager: [phone] Office: [phone]    Dorathy Kinsman [place] 02/21/2021, 6:01 PM

## 2021-02-21 NOTE — Progress Notes (Signed)
[facility] PROGRESS NOTE  [name] QBH:[id number] DOB: [birth year] DOA: 01/23/2021 PCP: Mirna Mires, MD  Status: Remains inpatient appropriate because:Altered mental status, Unsafe d/c plan, IV treatments appropriate due to intensity of illness or inability to take PO, and Inpatient level of care appropriate due to severity of illness  Dispo: The patient is from: Home              Anticipated d/c is to: Home:               Patient currently is not medically stable to d/c.Marland Kitchen  Now that she has awakened need to ensure that patient can mobilize appropriately and navigate stairs before discharge home.   Difficult to place patient Yes   Level of care: Med-Surg  Code Status: Full Family Communication: Sister [name] 9/2 DVT prophylaxis: Lovenox COVID vaccination status: Unknown   HPI: 55 y.o. F with DM, HTN, developmental delay who presented with decreased oral intake and "saying bizarre things".  In the ER, CT head unremarkable.  She was hypoglycemic and otherwise had normal chemistries and hemogram.  Admitted for evaluation of erratic behavior.  Subjective: Patient in bed she is much alert than she was on 02/20/2021, denies any headache chest or abdominal pain, she says he is hungry and will eat, currently does not want to get out of the bed, no headache chest pain or abdominal pain.  Objective: Vitals:   02/21/21 0358 02/21/21 0801  BP: 130/78 113/67  Pulse: 89 84  Resp: 18 14  Temp: 98.4 F (36.9 C) 98 F (36.7 C)  SpO2: 97% 97%    Intake/Output Summary (Last 24 hours) at 02/21/2021 1000 Last data filed at 02/21/2021 0810 Gross per 24 hour  Intake 2075.83 ml  Output 1600 ml  Net 475.83 ml   Filed Weights   02/18/21 0425 02/19/21 0500 02/20/21 0432  Weight: 71.4 kg 73.7 kg 73.5 kg    Exam: Awake more alert than on 02/20/21, no FN deficits, PEG in place [place].AT,PERRAL Supple Neck,No JVD, No cervical lymphadenopathy appriciated.  Symmetrical Chest wall movement, Good  air movement bilaterally, CTAB RRR,No Gallops, Rubs or new Murmurs, No Parasternal Heave +ve B.Sounds, Abd Soft, No tenderness, No organomegaly appriciated, No rebound - guarding or rigidity. 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  Brain imaging unremarkable.  EEG unremarkable.  UA negative, urine drug screen negative, procalcitonin low.  B12, ammonia, TSH normal. Acute metabolic encephalopathy ruled out.  She was seen and cleared by neurology, she was also seen by psychiatry.  Due to her poor oral intake PEG tube was placed and she has been started on PEG tube feeds.  She was initiated on Ativan by psychiatry on 02/18/2021 after which her mental status improved however she continues to have some waxing and waning features, on 02/19/2021 she is less responsive and appears somewhat sedated, case was discussed with psychiatrist Dr. Rowe Clack, medications adjusted further on 02/19/2021 including Cogentin, Haldol and Ativan.  With reduction in Ativan dose on 02/19/2021 she is more arousable on 02/20/2021, Ativan was reduced further on 02/20/2021 and on 02/22/2019 which she is more alert.  Of psych medications to psychiatry team.  If she shows sustained improvement in her mental status and functional status she could discharge back to patient's sister versus SNF depending on her condition in the next  few days and her final baseline  Poor oral intake 2/2 fixed delusions/Malnutrition/Obesity - PEG tube placed on 8/29, ethics team consulted and PEG tube was placed, if mental status show sustained improvement and she is taking oral diet on a continued basis PEG tube can be discontinued after 4 to 6 weeks after the tract has matured.  Nutrition Problem: Inadequate oral intake Etiology:  lethargy/confusion Signs/Symptoms: meal completion < 25%, per patient/family report Interventions: Tube feeding   Seizure disorder POA - Continue keppra at home dose levels are stable.  Physical deconditioning secondary to fixed delusion - limited by catatonia and delusions, once mentation is better she does walk with PT and did that on 02/18/21, PT - OT following, placement will be based on her progression and functional status.   Mild asymptomatic transaminitis.  Stable total bilirubin, stable alkaline phosphatase.  Monitor.    Diabetes melitis 2 uncontrolled on long-term insulin -  on SSI, monitor closely with inconsistent oral intake and for now  Lab Results  Component Value Date   HGBA1C 6.4 (H) 01/23/2021  CBG (last 3)  Recent Labs    02/21/21 0009 02/21/21 0356 02/21/21 0739  GLUCAP 132* 171* 195*       Data Reviewed: Basic Metabolic Panel: Recent Labs  Lab 02/15/21 0139 02/16/21 0322 02/17/21 0148 02/18/21 0308 02/19/21 0313 02/20/21 0129  NA 139 139 139  --   --  135  K 4.1 4.0 3.7  --   --  4.1  CL 105 103 102  --   --  101  CO2 26 25 30   --   --  28  GLUCOSE 124* 167* 185*  --   --  182*  BUN 14 14 13   --   --  12  CREATININE 0.74 0.66 0.69  --   --  0.59  CALCIUM 9.7 9.6 9.5  --   --  9.4  MG 2.2 2.2 2.3 2.2 2.2 2.2  PHOS 3.7 3.5 3.3 4.0 3.8 4.2   Liver Function Tests: Recent Labs  Lab 02/15/21 0139 02/16/21 0322 02/17/21 0148 02/20/21 0129  AST 48* 50* 41 59*  ALT 39 46* 38 58*  ALKPHOS 74 71 70 71  BILITOT 1.2 0.8 0.6 <0.1*  PROT 7.5 7.1 6.9 6.4*  ALBUMIN 3.4* 3.2* 3.0* 2.9*   No results for input(s): LIPASE, AMYLASE in the last 168 hours. No results for input(s): AMMONIA in the last 168 hours. CBC: Recent Labs  Lab 02/15/21 0139 02/16/21 0322 02/17/21 0148 02/20/21 0129  WBC 14.5* 10.3 9.3 9.3  NEUTROABS 8.7* 5.6 5.8  --   HGB 12.6 11.7* 11.7* 11.4*  HCT 39.0 37.1 36.6 36.1  MCV 92.6 93.5 92.4 91.9  PLT 420* 395 394 381    Cardiac Enzymes: No results for input(s): CKTOTAL, CKMB, CKMBINDEX, TROPONINI in the last 168 hours. BNP (last 3 results) Recent Labs    11/01/20 0405 11/02/20 0350 11/03/20 0155  BNP 15.0 8.6 6.0    ProBNP (last 3 results) No results for input(s): PROBNP in the last 8760 hours.  CBG: Recent Labs  Lab 02/20/21 1630 02/20/21 1944 02/21/21 0009 02/21/21 0356 02/21/21 0739  GLUCAP 142* 220* 132* 171* 195*    No results found for this or any previous visit (from the past 240 hour(s)).   Studies: No results found.  Scheduled Meds:  benztropine  0.5 mg Per Tube Daily   enoxaparin (LOVENOX) injection  40 mg Subcutaneous Q24H   free water  100 mL Per  Tube Q4H   insulin aspart  0-15 Units Subcutaneous Q4H   levETIRAcetam  1,000 mg Per Tube BID   LORazepam  0.5 mg Per Tube TID   multivitamin with minerals  1 tablet Per Tube Daily   valACYclovir  500 mg Per Tube QHS   Continuous Infusions:  feeding supplement (GLUCERNA 1.2 CAL) 1,000 mL (02/21/21 0821)    Principal Problem:   Acute encephalopathy Active Problems:   Hypoglycemia   Constipation   Bipolar disorder (HCC)   Diabetes (HCC)   AMS (altered mental status)   Poor appetite   Psychogenic loss of appetite   Intellectual delay   Consultants: Neurology Psychiatry  Procedures: EEG Long-term EEG  Antibiotics: Valacyclovir 8/15 through 8/28  Time spent: 25 minutes 27  days  Signature  Susa Raring M.D on 02/21/2021 at 10:00 AM   -  To page go to [email or web address]

## 2021-02-21 NOTE — Progress Notes (Signed)
[facility] PROGRESS NOTE  [name] UXL:[id number] DOB: [birth year] DOA: 01/23/2021 PCP: Mirna Mires, MD  Status: Remains inpatient appropriate because:Altered mental status, Unsafe d/c plan, IV treatments appropriate due to intensity of illness or inability to take PO, and Inpatient level of care appropriate due to severity of illness  Dispo: The patient is from: Home              Anticipated d/c is to: Home:               Patient currently is not medically stable to d/c.Marland Kitchen  Now that she has awakened need to ensure that patient can mobilize appropriately and navigate stairs before discharge home.   Difficult to place patient Yes   Level of care: Med-Surg  Code Status: Full Family Communication: Sister [name] 9/2 DVT prophylaxis: Lovenox COVID vaccination status: Unknown   HPI: 55 y.o. F with DM, HTN, developmental delay who presented with decreased oral intake and "saying bizarre things".   In the ER, CT head unremarkable.  She was hypoglycemic and otherwise had normal chemistries and hemogram.  Admitted for evaluation of erratic behavior.  Subjective: Awakens.  No specific request when asked.  Informed patient that she has to be able to walk up and down stairs before she can go home with her sister.  Objective: Vitals:   02/21/21 0008 02/21/21 0358  BP: 116/74 130/78  Pulse: 86 89  Resp: 18 18  Temp: 98.1 F (36.7 C) 98.4 F (36.9 C)  SpO2: 97% 97%    Intake/Output Summary (Last 24 hours) at 02/21/2021 0755 Last data filed at 02/21/2021 0442 Gross per 24 hour  Intake 980 ml  Output 2300 ml  Net -1320 ml   Filed Weights   02/18/21 0425 02/19/21 0500 02/20/21 0432  Weight: 71.4 kg 73.7 kg 73.5 kg    Exam:  Constitutional: NAD, calm, consistent Respiratory: Lungs CTA, stable on room air, pulse oximetry reading Cardiovascular: Regular rate and rhythm, S1S2, No extremity edema.  Extremities warm to touch Abdomen: PEG tube placed 8/29. LBM 9/02 Neurologic:  Cranial nerves II through XII are intact.  Patient moving all extremities equally with strength 4-5/5.  Sensation intact. Psychiatric: Alert and oriented x3.  No further catatonia symptoms.  Very interactive.   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  Imaging, EEG and metabolic work-up unremarkable Psychiatry consulted -symptoms felt to be related to catatonia. 9/1 Ativan initiated with significant improvement in symptoms and patient appears to be at her baseline as of 9/2.  Due to altered mental status over the weekend Ativan dosage was discontinued and other psychotropic medications were adjusted per psychiatry with improvement in sleepiness overall. Because of lack of improvement/extremely poor oral intake during the hospitalization PEG tube which was placed on 8/29. As of 9/2 patient very awake and requested to discharge back to her sister's care and continue participating in the day program at [facility].  Poor oral intake 2/2 fixed delusions/Malnutrition/Obesity PEG tube placed on 8/29  Despite significant improvement in mentation after Ativan initiated oral intake remains poor Continue supplemental protein shakes per tube 3 times daily Nutrition Problem: Inadequate oral intake Etiology: lethargy/confusion Signs/Symptoms: meal completion < 25%, per patient/family report Interventions: Tube feeding   Seizure disorder POA Continue keppra - dose decreased on 8/31 due to altered mentation  Physical deconditioning secondary to fixed delusion Remains very deconditioned with fatigue with minimal activity Continues to require 2+  mod assist for transfers etc. Bolus for patient to be able to walk independently up and down stairs since this is a requirement at her sister's home   Diabetes melitis 2 uncontrolled on long-term insulin CBGs stable off  of long-acting insulin at this juncture Continue Glucerna tube feeding 12 hours nocturnally Continue SSI correction-tolerates oral diet can transition to AC/HS glucometer checks  Hypokalemia Hypomagnesemia Resolved         Data Reviewed: Basic Metabolic Panel: Recent Labs  Lab 02/15/21 0139 02/16/21 0322 02/17/21 0148 02/18/21 0308 02/19/21 0313 02/20/21 0129  NA 139 139 139  --   --  135  K 4.1 4.0 3.7  --   --  4.1  CL 105 103 102  --   --  101  CO2 26 25 30   --   --  28  GLUCOSE 124* 167* 185*  --   --  182*  BUN 14 14 13   --   --  12  CREATININE 0.74 0.66 0.69  --   --  0.59  CALCIUM 9.7 9.6 9.5  --   --  9.4  MG 2.2 2.2 2.3 2.2 2.2 2.2  PHOS 3.7 3.5 3.3 4.0 3.8 4.2   Liver Function Tests: Recent Labs  Lab 02/15/21 0139 02/16/21 0322 02/17/21 0148 02/20/21 0129  AST 48* 50* 41 59*  ALT 39 46* 38 58*  ALKPHOS 74 71 70 71  BILITOT 1.2 0.8 0.6 <0.1*  PROT 7.5 7.1 6.9 6.4*  ALBUMIN 3.4* 3.2* 3.0* 2.9*   No results for input(s): LIPASE, AMYLASE in the last 168 hours. No results for input(s): AMMONIA in the last 168 hours. CBC: Recent Labs  Lab 02/15/21 0139 02/16/21 0322 02/17/21 0148 02/20/21 0129  WBC 14.5* 10.3 9.3 9.3  NEUTROABS 8.7* 5.6 5.8  --   HGB 12.6 11.7* 11.7* 11.4*  HCT 39.0 37.1 36.6 36.1  MCV 92.6 93.5 92.4 91.9  PLT 420* 395 394 381   Cardiac Enzymes: No results for input(s): CKTOTAL, CKMB, CKMBINDEX, TROPONINI in the last 168 hours. BNP (last 3 results) Recent Labs    11/01/20 0405 11/02/20 0350 11/03/20 0155  BNP 15.0 8.6 6.0    ProBNP (last 3 results) No results for input(s): PROBNP in the last 8760 hours.  CBG: Recent Labs  Lab 02/20/21 1630 02/20/21 1944 02/21/21 0009 02/21/21 0356 02/21/21 0739  GLUCAP 142* 220* 132* 171* 195*    No results found for this or any previous visit (from the past 240 hour(s)).   Studies: No results found.  Scheduled Meds:  benztropine  0.5 mg Per Tube Daily   enoxaparin  (LOVENOX) injection  40 mg Subcutaneous Q24H   free water  100 mL Per Tube Q4H   insulin aspart  0-15 Units Subcutaneous Q4H   levETIRAcetam  1,000 mg Per Tube BID   LORazepam  0.5 mg Per Tube TID   multivitamin with minerals  1 tablet Per Tube Daily   valACYclovir  500 mg Per Tube QHS   Continuous Infusions:  feeding supplement (GLUCERNA 1.2 CAL) 1,000 mL (02/20/21 2101)    Principal Problem:   Acute encephalopathy Active Problems:   Hypoglycemia   Constipation   Bipolar disorder (HCC)   Diabetes (HCC)   AMS (altered mental status)   Poor appetite   Psychogenic loss of appetite   Intellectual delay   Consultants: Neurology Psychiatry  Procedures: EEG Long-term EEG  Antibiotics: Valacyclovir 8/15 through 8/28   Time spent: 5 minutes  Junious Silk ANP  [facility] 7 am - 330 pm/M-F for direct patient care and secure chat Please refer to Amion for contact info 27  days

## 2021-02-22 DIAGNOSIS — E876 Hypokalemia: Secondary | ICD-10-CM | POA: Diagnosis not present

## 2021-02-22 LAB — GLUCOSE, CAPILLARY
Glucose-Capillary: 129 mg/dL — ABNORMAL HIGH (ref 70–99)
Glucose-Capillary: 137 mg/dL — ABNORMAL HIGH (ref 70–99)
Glucose-Capillary: 145 mg/dL — ABNORMAL HIGH (ref 70–99)
Glucose-Capillary: 176 mg/dL — ABNORMAL HIGH (ref 70–99)
Glucose-Capillary: 195 mg/dL — ABNORMAL HIGH (ref 70–99)
Glucose-Capillary: 192 mg/dL — ABNORMAL HIGH (ref 70–99)

## 2021-02-22 NOTE — Plan of Care (Signed)
  Problem: Clinical Measurements: Goal: Ability to maintain clinical measurements within normal limits will improve Outcome: Progressing Goal: Will remain free from infection Outcome: Progressing Goal: Diagnostic test results will improve Outcome: Progressing Goal: Respiratory complications will improve Outcome: Progressing Goal: Cardiovascular complication will be avoided Outcome: Progressing   Problem: Activity: Goal: Risk for activity intolerance will decrease Outcome: Progressing   Problem: Nutrition: Goal: Adequate nutrition will be maintained Outcome: Progressing   Problem: Coping: Goal: Level of anxiety will decrease Outcome: Progressing   Problem: Elimination: Goal: Will not experience complications related to urinary retention Outcome: Progressing   Problem: Pain Managment: Goal: General experience of comfort will improve Outcome: Progressing   Problem: Safety: Goal: Ability to remain free from injury will improve Outcome: Progressing   Problem: Skin Integrity: Goal: Risk for impaired skin integrity will decrease Outcome: Progressing   Problem: Safety: Goal: Non-violent Restraint(s) Outcome: Progressing   Problem: Coping: Goal: Ability to adjust to condition or change in health will improve Outcome: Progressing   Problem: Health Behavior/Discharge Planning: Goal: Compliance with prescribed medication regimen will improve Outcome: Progressing   Problem: Medication: Goal: Risk for medication side effects will decrease Outcome: Progressing   Problem: Clinical Measurements: Goal: Complications related to the disease process, condition or treatment will be avoided or minimized Outcome: Progressing Goal: Diagnostic test results will improve Outcome: Progressing   Problem: Safety: Goal: Verbalization of understanding the information provided will improve Outcome: Progressing   Problem: Self-Concept: Goal: Level of anxiety will decrease Outcome:  Progressing

## 2021-02-22 NOTE — Progress Notes (Signed)
[facility] PROGRESS NOTE  [name] OZY:[id number] DOB: 1966-03-05 DOA: 01/23/2021 PCP: Mirna Mires, MD  Status: Remains inpatient appropriate because:Altered mental status, Unsafe d/c plan, IV treatments appropriate due to intensity of illness or inability to take PO, and Inpatient level of care appropriate due to severity of illness  Dispo: The patient is from: Home              Anticipated d/c is to: Home:               Patient currently is not medically stable to d/c.Marland Kitchen  Now that she has awakened need to ensure that patient can mobilize appropriately and navigate stairs before discharge home.   Difficult to place patient Yes   Level of care: Med-Surg  Code Status: Full Family Communication: Sister [name] 9/2 DVT prophylaxis: Lovenox COVID vaccination status: Unknown   HPI: 55 y.o. F with DM, HTN, developmental delay who presented with decreased oral intake and "saying bizarre things".   In the ER, CT head unremarkable.  She was hypoglycemic and otherwise had normal chemistries and hemogram.  Admitted for evaluation of erratic behavior.  Subjective: Awakened.  Improved oral intake after nocturnal tube feedings discontinued.  Had not yet eaten breakfast Gus had just awakened  Objective: Vitals:   02/21/21 2349 02/22/21 0354  BP: 108/75 129/83  Pulse: 88 82  Resp: 19 18  Temp: 97.7 F (36.5 C) 97.6 F (36.4 C)  SpO2: 97% 95%    Intake/Output Summary (Last 24 hours) at 02/22/2021 0744 Last data filed at 02/22/2021 0651 Gross per 24 hour  Intake 2525 ml  Output 1750 ml  Net 775 ml   Filed Weights   02/19/21 0500 02/20/21 0432 02/22/21 0651  Weight: 73.7 kg 73.5 kg 71.6 kg    Exam:  Constitutional: NAD, calm, awakened easily-smiling Respiratory: Lung sounds are clear, no increased work of breathing, stable on room air Cardiac: S1S2, No extremity edema.  Normotensive extremities warm to touch Abdomen: PEG tube placed 8/29. LBM 9/02 Neurologic: Cranial  nerves II through XII are intact.  Patient moving all extremities equally with strength 4-5/5.  Sensation intact. Psychiatric: Awake and oriented x3.  No further catatonia symptoms.     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  Imaging, EEG and metabolic work-up unremarkable Psychiatry consulted -symptoms felt to be related to catatonia. 9/1 Ativan initiated with significant improvement in symptoms and patient appears to be at her baseline as of 9/2. Because of lack of improvement/extremely poor oral intake during the hospitalization PEG tube which was placed on 8/29. As of 9/2 patient awake and requested to discharge back to her sister's care and continue participating in the day program at [facility].  Poor oral intake 2/2 fixed delusions/Malnutrition/Obesity PEG tube placed on 8/29  Despite significant improvement in mentation after Ativan initiated oral intake remains poor; on 9/5 decision made to stop nocturnal tube feedings in the hopes that patient's oral intake would improve. Nutrition Problem: Inadequate oral intake Etiology: lethargy/confusion Signs/Symptoms: meal completion < 25%, per patient/family report Interventions: Tube feeding   Seizure disorder POA Continue keppra - dose decreased on 8/31 due to altered mentation  Physical deconditioning secondary to fixed delusion 55 patient documented as having good tolerance for bed mobility and transfer training.  Improved balance with minimal to mod assist for transfers and one-person assist for ambulation. Bolus for patient to be able to walk independently up and  down stairs since this is a requirement at her sister's home   Diabetes melitis 2 uncontrolled on long-term insulin CBGs stable off of long-acting insulin at this juncture Continue Glucerna tube feeding 12 hours  nocturnally Continue SSI correction-tolerates oral diet can transition to AC/HS glucometer checks  Hypokalemia Hypomagnesemia Resolved         Data Reviewed: Basic Metabolic Panel: Recent Labs  Lab 02/16/21 0322 02/17/21 0148 02/18/21 0308 02/19/21 0313 02/20/21 0129  NA 139 139  --   --  135  K 4.0 3.7  --   --  4.1  CL 103 102  --   --  101  CO2 25 30  --   --  28  GLUCOSE 167* 185*  --   --  182*  BUN 14 13  --   --  12  CREATININE 0.66 0.69  --   --  0.59  CALCIUM 9.6 9.5  --   --  9.4  MG 2.2 2.3 2.2 2.2 2.2  PHOS 3.5 3.3 4.0 3.8 4.2   Liver Function Tests: Recent Labs  Lab 02/16/21 0322 02/17/21 0148 02/20/21 0129  AST 50* 41 59*  ALT 46* 38 58*  ALKPHOS 71 70 71  BILITOT 0.8 0.6 <0.1*  PROT 7.1 6.9 6.4*  ALBUMIN 3.2* 3.0* 2.9*   No results for input(s): LIPASE, AMYLASE in the last 168 hours. No results for input(s): AMMONIA in the last 168 hours. CBC: Recent Labs  Lab 02/16/21 0322 02/17/21 0148 02/20/21 0129  WBC 10.3 9.3 9.3  NEUTROABS 5.6 5.8  --   HGB 11.7* 11.7* 11.4*  HCT 37.1 36.6 36.1  MCV 93.5 92.4 91.9  PLT 395 394 381   Cardiac Enzymes: No results for input(s): CKTOTAL, CKMB, CKMBINDEX, TROPONINI in the last 168 hours. BNP (last 3 results) Recent Labs    11/01/20 0405 11/02/20 0350 11/03/20 0155  BNP 15.0 8.6 6.0    ProBNP (last 3 results) No results for input(s): PROBNP in the last 8760 hours.  CBG: Recent Labs  Lab 02/21/21 1107 02/21/21 1612 02/21/21 2014 02/21/21 2348 02/22/21 0355  GLUCAP 205* 216* 233* 238* 129*    No results found for this or any previous visit (from the past 240 hour(s)).   Studies: No results found.  Scheduled Meds:  benztropine  0.5 mg Per Tube Daily   enoxaparin (LOVENOX) injection  40 mg Subcutaneous Q24H   free water  100 mL Per Tube Q4H   insulin aspart  0-15 Units Subcutaneous Q4H   levETIRAcetam  1,000 mg Per Tube BID   LORazepam  0.5 mg Per Tube TID   multivitamin with  minerals  1 tablet Per Tube Daily   valACYclovir  500 mg Per Tube QHS   Continuous Infusions:    Principal Problem:   Acute encephalopathy Active Problems:   Hypoglycemia   Constipation   Bipolar disorder (HCC)   Diabetes (HCC)   AMS (altered mental status)   Poor appetite   Psychogenic loss of appetite   Intellectual delay   Consultants: Neurology Psychiatry  Procedures: EEG Long-term EEG  Antibiotics: Valacyclovir 8/15 through 8/28   Time spent: 5 minutes    Junious Silk ANP  [facility] 7 am - 330 pm/M-F for direct patient care and secure chat Please refer to Amion for contact info 28  days

## 2021-02-22 NOTE — Progress Notes (Signed)
Occupational Therapy Treatment Patient Details Name: [name] MRN: [id number] DOB: [birth year] Today's Date: 02/22/2021    History of present illness 55 y.o. female admitted for neurological evaluation of suspected encephalopathy.  However, brain imaging was normal, EEG unremarkable, UA negative, urine drug screen negative, procalcitonin low, B12, ammonia, and TSH normal.  Acute metabolic encephalopathy was ruled out. Progressive mental decline over previous 2 days so another EEG and CT head performed 8/30 with normal results, no sign of seizures. Improving mental status 02/18/21. PMH: intellectual disability, bipolar disorder, seizure disorder, type II diabetes, hypertension, hyperlipidemia, immune deficiency disorder.   OT comments  Patient seen by skilled OT to address bed mobility, grooming standing at sink, and functional mobility.  Patient was min assist to get to eob from supine and min guard to min assist for mobility and standing at sink. Patient required vcs for grooming for sequencing. Patient left in chair with setup for lunch.  Acute OT to continue to follow.   Follow Up Recommendations  SNF    Equipment Recommendations  3 in 1 bedside commode;Tub/shower seat    Recommendations for Other Services      Precautions / Restrictions Precautions Precautions: Fall Precaution Comments: G tube and abdominal binder in place.       Mobility Bed Mobility Overal bed mobility: Needs Assistance Bed Mobility: Supine to Sit     Supine to sit: Min assist;HOB elevated     General bed mobility comments: Assistance with BLEs    Transfers Overall transfer level: Needs assistance Equipment used: Rolling walker (2 wheeled) Transfers: Sit to/from Stand Sit to Stand: Min guard;Min assist Stand pivot transfers: Min assist       General transfer comment: vcs to use walker correctly    Balance Overall balance assessment: Needs assistance Sitting-balance support: Feet  supported Sitting balance-Leahy Scale: Fair     Standing balance support: Single extremity supported Standing balance-Leahy Scale: Poor Standing balance comment: stood at sink for grooming                           ADL either performed or assessed with clinical judgement   ADL Overall ADL's : Needs assistance/impaired Eating/Feeding: Set up;Sitting Eating/Feeding Details (indicate cue type and reason): setup seated in recliner Grooming: Wash/dry hands;Wash/dry face;Min guard;Standing Grooming Details (indicate cue type and reason): performed standing at sink                             Functional mobility during ADLs: Min guard;Rolling walker General ADL Comments: vcs for safety     Vision       Perception     Praxis      Cognition Arousal/Alertness: Awake/alert Behavior During Therapy: WFL for tasks assessed/performed Overall Cognitive Status: History of cognitive impairments - at baseline Area of Impairment: Memory;Safety/judgement;Awareness;Problem solving                 Orientation Level: Disoriented to;Situation;Time Current Attention Level: Sustained Memory: Decreased recall of precautions Following Commands: Follows one step commands consistently;Follows one step commands with increased time Safety/Judgement: Decreased awareness of deficits;Decreased awareness of safety Awareness: Intellectual Problem Solving: Slow processing General Comments: followed one step commands        Exercises     Shoulder Instructions       General Comments      Pertinent Vitals/ Pain       Pain Assessment: No/denies pain  Home Living                                          Prior Functioning/Environment              Frequency  Min 2X/week        Progress Toward Goals  OT Goals(current goals can now be found in the care plan section)  Progress towards OT goals: Progressing toward goals  Acute Rehab OT  Goals Patient Stated Goal: to get stronger OT Goal Formulation: With patient Time For Goal Achievement: 02/25/21 Potential to Achieve Goals: Good ADL Goals Pt Will Perform Grooming: with min guard assist;sitting Pt Will Perform Upper Body Bathing: with min assist;sitting Pt Will Perform Upper Body Dressing: with min assist;sitting Additional ADL Goal #1: Patient will be Min A of one for supine to sit to increase independence with upper body ADL  Plan Discharge plan remains appropriate    Co-evaluation                 AM-PAC OT "6 Clicks" Daily Activity     Outcome Measure   Help from another person eating meals?: None Help from another person taking care of personal grooming?: A Little Help from another person toileting, which includes using toliet, bedpan, or urinal?: A Lot Help from another person bathing (including washing, rinsing, drying)?: A Lot Help from another person to put on and taking off regular upper body clothing?: A Little Help from another person to put on and taking off regular lower body clothing?: A Lot 6 Click Score: 16    End of Session Equipment Utilized During Treatment: Gait belt;Rolling walker  OT Visit Diagnosis: Other symptoms and signs involving cognitive function;Muscle weakness (generalized) (M62.81)   Activity Tolerance Patient tolerated treatment well   Patient Left in chair;with call bell/phone within reach;with chair alarm set   Nurse Communication Mobility status        Time: 1428-1450 OT Time Calculation (min): 22 min  Charges: OT General Charges $OT Visit: 1 Visit OT Treatments $Self Care/Home Management : 8-22 mins  Alfonse Flavors, OTA    Dewain Penning 02/22/2021, 2:58 PM

## 2021-02-23 DIAGNOSIS — G9340 Encephalopathy, unspecified: Secondary | ICD-10-CM | POA: Diagnosis not present

## 2021-02-23 DIAGNOSIS — F319 Bipolar disorder, unspecified: Secondary | ICD-10-CM

## 2021-02-23 LAB — GLUCOSE, CAPILLARY
Glucose-Capillary: 165 mg/dL — ABNORMAL HIGH (ref 70–99)
Glucose-Capillary: 191 mg/dL — ABNORMAL HIGH (ref 70–99)
Glucose-Capillary: 231 mg/dL — ABNORMAL HIGH (ref 70–99)
Glucose-Capillary: 166 mg/dL — ABNORMAL HIGH (ref 70–99)
Glucose-Capillary: 156 mg/dL — ABNORMAL HIGH (ref 70–99)

## 2021-02-23 MED ORDER — LORAZEPAM 0.5 MG PO TABS: 0.5000 mg | ORAL_TABLET | Freq: Four times a day (QID) | ORAL | Status: DC | PRN

## 2021-02-23 MED ORDER — SENNOSIDES-DOCUSATE SODIUM 8.6-50 MG PO TABS: 1.0000 | ORAL_TABLET | Freq: Every evening | ORAL | Status: DC | PRN

## 2021-02-23 MED ORDER — LEVETIRACETAM 500 MG PO TABS
1000.0000 mg | ORAL_TABLET | Freq: Two times a day (BID) | ORAL | Status: DC
  Administered 2021-02-24 – 2021-02-27 (×7): 1000 mg via ORAL
  Filled 2021-02-23 (×7): qty 2

## 2021-02-23 MED ORDER — LORAZEPAM 0.5 MG PO TABS
0.5000 mg | ORAL_TABLET | Freq: Four times a day (QID) | ORAL | Status: DC | PRN
  Administered 2021-03-05 – 2021-03-12 (×2): 0.5 mg via ORAL
  Filled 2021-02-23 (×2): qty 1

## 2021-02-23 MED ORDER — FREE WATER
100.0000 mL | Freq: Three times a day (TID) | Status: DC
  Administered 2021-02-23 – 2021-03-25 (×89): 100 mL

## 2021-02-23 MED ORDER — GLUCERNA SHAKE PO LIQD
237.0000 mL | Freq: Three times a day (TID) | ORAL | Status: DC
  Administered 2021-02-23 – 2021-03-25 (×87): 237 mL via ORAL

## 2021-02-23 MED ORDER — INSULIN ASPART 100 UNIT/ML IJ SOLN
0.0000 [IU] | Freq: Three times a day (TID) | INTRAMUSCULAR | Status: DC
  Administered 2021-02-24 – 2021-02-25 (×4): 3 [IU] via SUBCUTANEOUS
  Administered 2021-02-25: 11 [IU] via SUBCUTANEOUS
  Administered 2021-02-25 – 2021-02-26 (×4): 5 [IU] via SUBCUTANEOUS
  Administered 2021-02-27: 8 [IU] via SUBCUTANEOUS
  Administered 2021-02-27: 5 [IU] via SUBCUTANEOUS
  Administered 2021-02-28: 15 [IU] via SUBCUTANEOUS
  Administered 2021-02-28: 3 [IU] via SUBCUTANEOUS
  Administered 2021-02-28 – 2021-03-01 (×2): 5 [IU] via SUBCUTANEOUS

## 2021-02-23 MED ORDER — ACETAMINOPHEN 325 MG PO TABS
650.0000 mg | ORAL_TABLET | Freq: Four times a day (QID) | ORAL | Status: DC | PRN
  Administered 2021-03-15 – 2021-03-18 (×2): 650 mg via ORAL
  Filled 2021-02-23 (×2): qty 2

## 2021-02-23 MED ORDER — BENZTROPINE MESYLATE 0.5 MG PO TABS
0.5000 mg | ORAL_TABLET | Freq: Every day | ORAL | Status: DC
  Administered 2021-02-24 – 2021-03-10 (×15): 0.5 mg via ORAL
  Filled 2021-02-23 (×16): qty 1

## 2021-02-23 MED ORDER — ADULT MULTIVITAMIN W/MINERALS CH
1.0000 | ORAL_TABLET | Freq: Every day | ORAL | Status: DC
  Administered 2021-02-24 – 2021-03-25 (×30): 1 via ORAL
  Filled 2021-02-23 (×30): qty 1

## 2021-02-23 MED ORDER — VALACYCLOVIR HCL 500 MG PO TABS
500.0000 mg | ORAL_TABLET | Freq: Every day | ORAL | Status: DC
  Administered 2021-02-23 – 2021-03-24 (×30): 500 mg via ORAL
  Filled 2021-02-23 (×30): qty 1

## 2021-02-23 NOTE — Progress Notes (Signed)
[facility] PROGRESS NOTE  [name] GYB:[id number] DOB: 1965/11/19 DOA: 01/23/2021 PCP: Mirna Mires, MD  Status: Remains inpatient appropriate because:Altered mental status, Unsafe d/c plan, IV treatments appropriate due to intensity of illness or inability to take PO, and Inpatient level of care appropriate due to severity of illness  Dispo: The patient is from: Home              Anticipated d/c is to: Home:               Patient currently is not medically stable to d/c.Marland Kitchen  Now that she has awakened need to ensure that patient can mobilize appropriately and navigate stairs before discharge home.   Difficult to place patient Yes   Level of care: Med-Surg  Code Status: Full Family Communication: Sister [name] 9/2 DVT prophylaxis: Lovenox COVID vaccination status: Unknown   HPI: 55 y.o. F with DM, HTN, developmental delay who presented with decreased oral intake and "saying bizarre things".   In the ER, CT head unremarkable.  She was hypoglycemic and otherwise had normal chemistries and hemogram.  Admitted for evaluation of erratic behavior.  Subjective: Alert and interactive.  No complaints.  Objective: Vitals:   02/23/21 0317 02/23/21 0332  BP: 113/72 116/82  Pulse: 89 97  Resp: 18 18  Temp: 98.2 F (36.8 C) 98.7 F (37.1 C)  SpO2: 100% 98%    Intake/Output Summary (Last 24 hours) at 02/23/2021 0738 Last data filed at 02/22/2021 2010 Gross per 24 hour  Intake 500 ml  Output 802 ml  Net -302 ml   Filed Weights   02/19/21 0500 02/20/21 0432 02/22/21 0651  Weight: 73.7 kg 73.5 kg 71.6 kg    Exam:  Constitutional: NAD, calm, awake active Respiratory: RA, Lungs CTA, work of breathing with activity Cardiac: S1S2, regular pulse.  Normotensive extremities warm to touch Abdomen: PEG tube placed 8/29. LBM 9/07 Neurologic: Cranial nerves II through XII are intact.  Patient moving all extremities equally with strength 4-5/5.  Sensation intact. Psychiatric: Awake  and oriented x3.  Interactive.   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  Imaging, EEG and metabolic work-up unremarkable Psychiatry consulted -symptoms felt to be related to catatonia. Ativan initiated during the hospitalization with significant improvement in patient's symptoms.  Plan is to discharge patient back home with her sister Because of lack of improvement/extremely poor oral intake during the hospitalization PEG tube which was placed on 8/29.  PEG can be continued in 4 to 6 weeks from date of insertion  Poor oral intake 2/2 fixed delusions/Malnutrition/Obesity PEG tube placed on 8/29  Eating better   Seizure disorder POA Continue keppra -dose lowered this admission  Physical deconditioning secondary to fixed delusion PT/OT aware of discharge plan to home.  Focus will be on stair navigation and increasing mobility   Diabetes melitis 2 uncontrolled on long-term insulin CBGs stable off of long-acting insulin at this juncture Continue SSI  Hypokalemia Hypomagnesemia Resolved         Data Reviewed: Basic Metabolic Panel: Recent Labs  Lab 02/17/21 0148 02/18/21 0308 02/19/21 0313 02/20/21 0129  NA 139  --   --  135  K 3.7  --   --  4.1  CL 102  --   --  101  CO2 30  --   --  28  GLUCOSE 185*  --   --  182*  BUN 13  --   --  12  CREATININE 0.69  --   --  0.59  CALCIUM 9.5  --   --  9.4  MG 2.3 2.2 2.2 2.2  PHOS 3.3 4.0 3.8 4.2   Liver Function Tests: Recent Labs  Lab 02/17/21 0148 02/20/21 0129  AST 41 59*  ALT 38 58*  ALKPHOS 70 71  BILITOT 0.6 <0.1*  PROT 6.9 6.4*  ALBUMIN 3.0* 2.9*   No results for input(s): LIPASE, AMYLASE in the last 168 hours. No results for input(s): AMMONIA in the last 168 hours. CBC: Recent Labs  Lab 02/17/21 0148 02/20/21 0129  WBC 9.3 9.3  NEUTROABS 5.8  --   HGB 11.7*  11.4*  HCT 36.6 36.1  MCV 92.4 91.9  PLT 394 381    CBG: Recent Labs  Lab 02/22/21 1136 02/22/21 1638 02/22/21 2005 02/22/21 2312 02/23/21 0354  GLUCAP 145* 176* 195* 192* 165*    Scheduled Meds:  benztropine  0.5 mg Per Tube Daily   enoxaparin (LOVENOX) injection  40 mg Subcutaneous Q24H   free water  100 mL Per Tube Q4H   insulin aspart  0-15 Units Subcutaneous Q4H   levETIRAcetam  1,000 mg Per Tube BID   LORazepam  0.5 mg Per Tube TID   multivitamin with minerals  1 tablet Per Tube Daily   valACYclovir  500 mg Per Tube QHS      Principal Problem:   Acute encephalopathy Active Problems:   Hypoglycemia   Constipation   Bipolar disorder (HCC)   Diabetes (HCC)   AMS (altered mental status)   Poor appetite   Psychogenic loss of appetite   Intellectual delay   Consultants: Neurology Psychiatry  Procedures: EEG Long-term EEG  Antibiotics: Valacyclovir 8/15 through 8/28   Time spent: 15 minutes    Junious Silk ANP  [facility] 7 am - 330 pm/M-F for direct patient care and secure chat Please refer to Amion for contact info 29  days

## 2021-02-23 NOTE — Consult Note (Signed)
Reason for Consult: Flat affect, refusal to eat, and medication management Referring Physician: Dr. Barbarann Ehlers [name] is an 55 y.o. female with history of diabetes, hypertension, developmental delay who presented approximately 30 days ago with bizarre behaviors and hallucinations.  HPI:  [name] is a 55 year old female admitted for evaluation of acute mental status change and consulted to the psychiatry service for potential medication management in light of flat affect and refusal to eat.  Patient is seen and reassessed by this nurse practitioner.  Psychiatry has been following her over this 30-day length of stay.  Today she is seen alert and oriented, calm and cooperative, very pleasant on engagement.  She is observed to be eating breakfast, in which she is able to correctly identify all items provided on her breakfast tray.  At this present time she answers all questions appropriately, and does not appear to be exhibiting delayed thinking and or disorganized thought processes.  She is able to tell me that she has a decent appetite, and good sleep.  She reports compliance with all of her current medications which include Cogentin 0.5 mg p.o. daily, lorazepam 0.5 mg p.o. 3 times daily.  Chart review shows patient is receiving Haldol 1 mg via IV every 6 hours as needed for agitation, this has not yet been administered.  As needed Haldol was ordered 4 days ago, which further supports patient's psychiatric improvement and stabilization.  She currently denies any delusions, paranoia, psychosis, auditory or visual hallucinations.  She is able to have a linear conversation.  She does not appear to be responding to internal stimuli, external stimuli, and or delusional.  At this time patient appears to be back at baseline, and will be psychiatrically cleared.  Patient is to follow-up with outpatient psychiatric resources.  Past Medical History:  Diagnosis Date   Diabetes mellitus without  complication (HCC)    Hypertension    Immune deficiency disorder (HCC)    Mild mental retardation     Past Surgical History:  Procedure Laterality Date   IR GASTROSTOMY TUBE MOD SED  02/14/2021    Family History  Problem Relation Age of Onset   Hypertension Other     Social History:  reports that she has never smoked. She has never used smokeless tobacco. She reports that she does not drink alcohol and does not use drugs.  Allergies:  Allergies  Allergen Reactions   Penicillins    Sulfa Antibiotics    Tomato     Medications: I have reviewed the patient's current medications.  Results for orders placed or performed during the hospital encounter of 01/23/21 (from the past 48 hour(s))  Glucose, capillary     Status: Abnormal   Collection Time: 02/21/21 11:07 AM  Result Value Ref Range   Glucose-Capillary 205 (H) 70 - 99 mg/dL    Comment: Glucose reference range applies only to samples taken after fasting for at least 8 hours.  Glucose, capillary     Status: Abnormal   Collection Time: 02/21/21  4:12 PM  Result Value Ref Range   Glucose-Capillary 216 (H) 70 - 99 mg/dL    Comment: Glucose reference range applies only to samples taken after fasting for at least 8 hours.  Glucose, capillary     Status: Abnormal   Collection Time: 02/21/21  8:14 PM  Result Value Ref Range   Glucose-Capillary 233 (H) 70 - 99 mg/dL    Comment: Glucose reference range applies only to samples taken after fasting for  at least 8 hours.  Glucose, capillary     Status: Abnormal   Collection Time: 02/21/21 11:48 PM  Result Value Ref Range   Glucose-Capillary 238 (H) 70 - 99 mg/dL    Comment: Glucose reference range applies only to samples taken after fasting for at least 8 hours.  Glucose, capillary     Status: Abnormal   Collection Time: 02/22/21  3:55 AM  Result Value Ref Range   Glucose-Capillary 129 (H) 70 - 99 mg/dL    Comment: Glucose reference range applies only to samples taken after  fasting for at least 8 hours.  Glucose, capillary     Status: Abnormal   Collection Time: 02/22/21  8:44 AM  Result Value Ref Range   Glucose-Capillary 137 (H) 70 - 99 mg/dL    Comment: Glucose reference range applies only to samples taken after fasting for at least 8 hours.  Glucose, capillary     Status: Abnormal   Collection Time: 02/22/21 11:36 AM  Result Value Ref Range   Glucose-Capillary 145 (H) 70 - 99 mg/dL    Comment: Glucose reference range applies only to samples taken after fasting for at least 8 hours.  Glucose, capillary     Status: Abnormal   Collection Time: 02/22/21  4:38 PM  Result Value Ref Range   Glucose-Capillary 176 (H) 70 - 99 mg/dL    Comment: Glucose reference range applies only to samples taken after fasting for at least 8 hours.  Glucose, capillary     Status: Abnormal   Collection Time: 02/22/21  8:05 PM  Result Value Ref Range   Glucose-Capillary 195 (H) 70 - 99 mg/dL    Comment: Glucose reference range applies only to samples taken after fasting for at least 8 hours.   Comment 1 Notify RN    Comment 2 Document in Chart   Glucose, capillary     Status: Abnormal   Collection Time: 02/22/21 11:12 PM  Result Value Ref Range   Glucose-Capillary 192 (H) 70 - 99 mg/dL    Comment: Glucose reference range applies only to samples taken after fasting for at least 8 hours.   Comment 1 Notify RN    Comment 2 Document in Chart   Glucose, capillary     Status: Abnormal   Collection Time: 02/23/21  3:54 AM  Result Value Ref Range   Glucose-Capillary 165 (H) 70 - 99 mg/dL    Comment: Glucose reference range applies only to samples taken after fasting for at least 8 hours.  Glucose, capillary     Status: Abnormal   Collection Time: 02/23/21  8:37 AM  Result Value Ref Range   Glucose-Capillary 191 (H) 70 - 99 mg/dL    Comment: Glucose reference range applies only to samples taken after fasting for at least 8 hours.    No results found.  Review of Systems   Respiratory:  Negative for cough.   Cardiovascular:  Negative for chest pain.  Gastrointestinal:  Negative for abdominal pain, constipation, diarrhea, nausea and vomiting.  Neurological:  Negative for headaches.  Psychiatric/Behavioral:  Negative for agitation, behavioral problems, sleep disturbance and suicidal ideas. The patient is not nervous/anxious.   Blood pressure (!) 150/67, pulse 90, temperature 98.7 F (37.1 C), temperature source Oral, resp. rate 20, weight 71.6 kg, SpO2 98 %. Physical Exam Vitals and nursing note reviewed.  Constitutional:      General: She is not in acute distress.    Appearance: Normal appearance. She is not ill-appearing or  toxic-appearing.  HENT:     Head: Normocephalic and atraumatic.  Pulmonary:     Effort: Pulmonary effort is normal.  Musculoskeletal:        General: Normal range of motion.  Neurological:     Mental Status: She is alert.    02/23/21 0918  Presentation  General Appearance Appropriate for Environment  Eye Contact Fair  Speech Clear and Coherent;Slow  Speech Volume Normal  Mood and Affect  Mood Euthymic  Affect Congruent;Appropriate  Thought Processes  Thought Process Goal Directed;Coherent  Descriptions of Associations Intact  Orientation Full (Time, Place and Person)  Thought Content Logical;WDL  Hallucinations None  Ideas of Reference None  Suicidal Thoughts No  Homicidal Thoughts No  Sensorium  Memory Immediate Fair;Recent Fair  Judgment Poor  Insight Shallow  Executive Functions  Concentration Fair  Attention Span Fair  Recall Poor  Fund of Knowledge Poor  Language Poor  Psychomotor Activity  Psychomotor Activity Normal  Assets  Assets Resilience;Social Support;Housing  Sleep  Sleep Good     Assessment/Plan:  Patient seen to be responding well to current psychotropic medication.  She is observed to be eating, and endorses having a good appetite.  She is pleasant, having linear conversation, and normal  thought processes.  She appears to be back at her baseline, will psychiatrically clear at this time.   IDD symptomology: Food refusal/ psychogenic loss of appetite  -Continue current medications   Catatonia: -Continue Ativan 1 mg q6 6 hours as needed for anxiety, sedation, sleep, and agitation.   Disposition: Recommend appropriate follow-up with outpatient psychiatry.  Psychiatry to sign off at this time.  Please reconsult for any additional or new concerns regarding this patient. Juel Burrow Starkes-Perry 02/23/2021, 10:09 AM

## 2021-02-23 NOTE — Progress Notes (Signed)
Nutrition Follow-up  DOCUMENTATION CODES:  Not applicable  INTERVENTION:  -Glucerna Shake po TID, each supplement provides 220 kcal and 10 grams of protein -Continue MVI with minerals daily  NUTRITION DIAGNOSIS:  Inadequate oral intake related to lethargy/confusion as evidenced by meal completion < 25%, per patient/family report. -- ongoing  GOAL:  Patient will meet greater than or equal to 90% of their needs -- progressing  MONITOR:  TF tolerance, Weight trends, Labs, I & O's  REASON FOR ASSESSMENT:  Consult Enteral/tube feeding initiation and management  ASSESSMENT:  Pt with PMH significant of intellectual disability, bipolar disorder, seizure disorder, type 2 DM, HTN, HLD, immunodeficiency disorder presented to the ED for change in mental status as reported by patient's sister for the past couple of days at home. CT head, MRI brain, EEG, and other neurologic work-up have been negative. Abdominal x-ray showed moderate stool burden. Pt does have h/o constipation and was recommended by GI to take Linzess and Dulcolax, though pt has been noncompliant. Pt has outpatient colonoscopy scheduled.  8/15 Cortrak placed (gastric tip) 8/29 PEG placed   Pt is now alert and interactive. Psych has signed off. Pt denies complaints at this time. Per NP, plan is now for pt to discharge home with her sister once mobility improves.  PO intake has significantly improved since last RD assessment, especially over the last 24 hours. Last 8 meal completions charted as 10-100% (42.5% average meal intake); however, last 3 meals documented as 75-100% completion and pt now reporting good appetite/endorses feelings of hunger. TF orders have been stopped per NP/MD (free water flushes should be continued to maintain tube). RD to provide pt with oral nutrition supplements to supplement diet.   Medications: SSI, mvi with minerals, free water Q4H via tube Labs reviewed.  CBGs: 129-231 x24 hours  UOP:  x24 hours I/O: +16.3L since admit  Admission weight: 71.6 kg Current weight: 71.6 kg  Per RN edema assessment, pt with non-pitting edema to RUE and BLE and mild pitting edema to LUE.   Diet Order:   Diet Order             Diet Carb Modified Fluid consistency: Thin; Room service appropriate? Yes  Diet effective now                  EDUCATION NEEDS:  Not appropriate for education at this time  Skin:  Skin Assessment: Reviewed RN Assessment  Last BM:  9/7  Height:  Ht Readings from Last 1 Encounters:  10/30/20 5\' 1"  (1.549 m)   Weight:  Wt Readings from Last 1 Encounters:  02/22/21 71.6 kg   BMI:  Body mass index is 29.83 kg/m.  Estimated Nutritional Needs:  Kcal:  1800-2000 Protein:  90-100 grams Fluid:  >1.8L/d    04/24/21, MS, RD, LDN (she/her/hers) RD pager number and weekend/on-call pager number located in Amion.

## 2021-02-23 NOTE — Progress Notes (Signed)
Physical Therapy Treatment Patient Details Name: [name] MRN: [id number] DOB: [birth year] Today's Date: 02/23/2021    History of Present Illness 55 y.o. female admitted for neurological evaluation of suspected encephalopathy.  However, brain imaging was normal, EEG unremarkable, UA negative, urine drug screen negative, procalcitonin low, B12, ammonia, and TSH normal.  Acute metabolic encephalopathy was ruled out. Progressive mental decline over previous 2 days so another EEG and CT head performed 8/30 with normal results, no sign of seizures. Improving mental status 02/18/21. PMH: intellectual disability, bipolar disorder, seizure disorder, type II diabetes, hypertension, hyperlipidemia, immune deficiency disorder.    PT Comments    Pt received in supine, agreeable and motivated to participate in therapy session and with good progress toward functional mobility goals this date. Pt able to perform greater than household distance ambulation task with RW support and min guard assist and ascended/descended 10 steps in PT gym with B handrails and min guard for safety, no LOB or buckling. Pt agreeable to sit up in chair at end of session. Plan to progress gait tolerance and assess dynamic balance next session, pt will need goal update next date by supervising PT as she has achieved multiple goals today. Discharge recommendation updated per discussion with supervising PT Adelene Idler and pt family agreeable to HHPT.   Follow Up Recommendations  Home health PT;Supervision/Assistance - 24 hour     Equipment Recommendations  Rolling walker with 5" wheels    Recommendations for Other Services       Precautions / Restrictions Precautions Precautions: Fall Precaution Comments: G tube and abdominal binder in place, briefs helpful Restrictions Weight Bearing Restrictions: No    Mobility  Bed Mobility Overal bed mobility: Needs Assistance Bed Mobility: Supine to Sit     Supine to sit: Min  assist;HOB elevated     General bed mobility comments: Assistance with raising trunk    Transfers Overall transfer level: Needs assistance Equipment used: Rolling walker (2 wheeled);1 person hand held assist Transfers: Sit to/from [employer] Sit to Stand: Min guard;Min assist Stand pivot transfers: Min guard       General transfer comment: minA with HHA to stand from EOB, min guard to pivot to recliner, then min guard to stand from recliner to RW  Ambulation/Gait Ambulation/Gait assistance: +2 safety/equipment;Min guard Gait Distance (Feet): 240 Feet Assistive device: Rolling walker (2 wheeled) Gait Pattern/deviations: Decreased stride length;Step-through pattern Gait velocity: grossly <0.3 m/s   General Gait Details: pt with improved bilateral foot clearance, good use of RW and much improved upright posture and activity tolerance; offered pt a choice of seated break after stair ascent in PT gym however pt defers, wanting to walk back to room.   Stairs Stairs: Yes Stairs assistance: Min guard Stair Management: Two rails;Step to pattern;Forwards Number of Stairs: 10 General stair comments: pt ascended/descended 5 steps of varying heights (3" and 7") in PT gym x2 trials without standing break needed, with good safety and increased time to perform, min guard due to previous hx of AMS but pt steady throughout.         Balance Overall balance assessment: Needs assistance Sitting-balance support: Feet supported Sitting balance-Leahy Scale: Fair     Standing balance support: Single extremity supported Standing balance-Leahy Scale: Poor Standing balance comment: improved upright posture with RW support and min guard at most externally            Cognition Arousal/Alertness: Awake/alert Behavior During Therapy: WFL for tasks assessed/performed Overall Cognitive Status: History  of cognitive impairments - at baseline Area of Impairment:  Memory;Safety/judgement;Awareness;Problem solving             Orientation Level: Disoriented to;Situation;Time Current Attention Level: Sustained Memory: Decreased recall of precautions Following Commands: Follows one step commands consistently;Follows one step commands with increased time Safety/Judgement: Decreased awareness of deficits;Decreased awareness of safety Awareness: Intellectual Problem Solving: Slow processing General Comments: followed one step commands, improved alertness and focus on activity. able to carry on conversation with therapist while performing functional mobility tasks.         General Comments General comments (skin integrity, edema, etc.): VSS on RA per chart review, no dizziness reported and BP not further assessed, no acute s/sx distress throughout      Pertinent Vitals/Pain Pain Assessment: Faces Faces Pain Scale: Hurts a little bit Pain Location: IV site on hand Pain Intervention(s): Monitored during session;Repositioned     PT Goals (current goals can now be found in the care plan section) Acute Rehab PT Goals Patient Stated Goal: to get stronger PT Goal Formulation: Patient unable to participate in goal setting Time For Goal Achievement: 02/25/21 Potential to Achieve Goals: Good Progress towards PT goals: Progressing toward goals    Frequency    Min 4X/week      PT Plan Discharge plan needs to be updated;Frequency needs to be updated       AM-PAC PT "6 Clicks" Mobility   Outcome Measure  Help needed turning from your back to your side while in a flat bed without using bedrails?: None Help needed moving from lying on your back to sitting on the side of a flat bed without using bedrails?: A Little Help needed moving to and from a bed to a chair (including a wheelchair)?: A Little Help needed standing up from a chair using your arms (e.g., wheelchair or bedside chair)?: A Little Help needed to walk in hospital room?: A  Little Help needed climbing 3-5 steps with a railing? : A Little 6 Click Score: 19    End of Session Equipment Utilized During Treatment: Gait belt Activity Tolerance: Patient tolerated treatment well Patient left: in chair;with call bell/phone within reach;with chair alarm set Nurse Communication: Mobility status PT Visit Diagnosis: Other abnormalities of gait and mobility (R26.89);Other symptoms and signs involving the nervous system (R29.898)     Time: 4196-2229 PT Time Calculation (min) (ACUTE ONLY): 19 min  Charges:  $Gait Training: 8-22 mins                     Marlene Beidler P., PTA Acute Rehabilitation Services Pager: [phone] Office: [phone]    Angus Palms 02/23/2021, 5:18 PM

## 2021-02-23 NOTE — Plan of Care (Signed)
  Problem: Education: Goal: Knowledge of General Education information will improve Description: Including pain rating scale, medication(s)/side effects and non-pharmacologic comfort measures Outcome: Progressing   Problem: Health Behavior/Discharge Planning: Goal: Ability to manage health-related needs will improve Outcome: Progressing   Problem: Clinical Measurements: Goal: Ability to maintain clinical measurements within normal limits will improve Outcome: Progressing Goal: Will remain free from infection Outcome: Progressing Goal: Diagnostic test results will improve Outcome: Progressing Goal: Respiratory complications will improve Outcome: Progressing Goal: Cardiovascular complication will be avoided Outcome: Progressing   Problem: Activity: Goal: Risk for activity intolerance will decrease Outcome: Progressing   Problem: Nutrition: Goal: Adequate nutrition will be maintained Outcome: Progressing   Problem: Coping: Goal: Level of anxiety will decrease Outcome: Progressing   Problem: Elimination: Goal: Will not experience complications related to bowel motility Outcome: Progressing Goal: Will not experience complications related to urinary retention Outcome: Progressing   Problem: Pain Managment: Goal: General experience of comfort will improve Outcome: Progressing   Problem: Safety: Goal: Ability to remain free from injury will improve Outcome: Progressing   Problem: Skin Integrity: Goal: Risk for impaired skin integrity will decrease Outcome: Progressing   Problem: Safety: Goal: Non-violent Restraint(s) Outcome: Progressing   Problem: Education: Goal: Expressions of having a comfortable level of knowledge regarding the disease process will increase Outcome: Progressing   Problem: Coping: Goal: Ability to adjust to condition or change in health will improve Outcome: Progressing Goal: Ability to identify appropriate support needs will  improve Outcome: Progressing   Problem: Health Behavior/Discharge Planning: Goal: Compliance with prescribed medication regimen will improve Outcome: Progressing   Problem: Medication: Goal: Risk for medication side effects will decrease Outcome: Progressing   Problem: Clinical Measurements: Goal: Complications related to the disease process, condition or treatment will be avoided or minimized Outcome: Progressing Goal: Diagnostic test results will improve Outcome: Progressing   Problem: Safety: Goal: Verbalization of understanding the information provided will improve Outcome: Progressing   Problem: Self-Concept: Goal: Level of anxiety will decrease Outcome: Progressing Goal: Ability to verbalize feelings about condition will improve Outcome: Progressing   

## 2021-02-24 DIAGNOSIS — E876 Hypokalemia: Secondary | ICD-10-CM | POA: Diagnosis not present

## 2021-02-24 LAB — GLUCOSE, CAPILLARY
Glucose-Capillary: 191 mg/dL — ABNORMAL HIGH (ref 70–99)
Glucose-Capillary: 183 mg/dL — ABNORMAL HIGH (ref 70–99)
Glucose-Capillary: 164 mg/dL — ABNORMAL HIGH (ref 70–99)

## 2021-02-24 NOTE — Progress Notes (Signed)
[facility] PROGRESS NOTE  [name]:[id number] DOB: 1965/06/28 DOA: 01/23/2021 PCP: Mirna Mires, MD  Status: Remains inpatient appropriate because:Altered mental status, Unsafe d/c plan, IV treatments appropriate due to intensity of illness or inability to take PO, and Inpatient level of care appropriate due to severity of illness  Dispo: The patient is from: Home              Anticipated d/c is to: Home:               Patient currently is not medically stable to d/c.Marland Kitchen  Now that she has awakened need to ensure that patient can mobilize appropriately and navigate stairs before discharge home.   Difficult to place patient Yes   Level of care: Med-Surg  Code Status: Full Family Communication: Sister [name] 9/2 DVT prophylaxis: Lovenox COVID vaccination status: Unknown   HPI: 55 y.o. F with DM, HTN, developmental delay who presented with decreased oral intake and "saying bizarre things".   In the ER, CT head unremarkable.  She was hypoglycemic and otherwise had normal chemistries and hemogram.  Admitted for evaluation of erratic behavior.  Subjective: Awake.  Understands that we are trying to ensure she has home health PT in place before discharge otherwise we will continue to perform aggressive PT here so she can go directly home.  Objective: Vitals:   02/23/21 2329 02/24/21 0310  BP: (!) 113/45 132/76  Pulse: 90 99  Resp: 15 15  Temp: (!) 97.5 F (36.4 C) 98 F (36.7 C)  SpO2: 99% 99%    Intake/Output Summary (Last 24 hours) at 02/24/2021 0742 Last data filed at 02/24/2021 0630 Gross per 24 hour  Intake 240 ml  Output 750 ml  Net -510 ml   Filed Weights   02/19/21 0500 02/20/21 0432 02/22/21 0651  Weight: 73.7 kg 73.5 kg 71.6 kg    Exam: Constitutional: NAD, calm, alert Respiratory: Remain clear to auscultation and any increased work of breathing.  Room air. Cardiac: Heart sounds remain normal, pulses regular, no peripheral edema and no JVD Abdomen:  PEG tube placed 8/29. LBM 9/07 Neurologic: Cranial nerves II through XII are intact.  Patient moving all extremities equally with strength 4-5/5.  Sensation intact. Psychiatric: Awake and oriented x3.  Interactive.   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 Ativan initiated during the hospitalization with significant improvement in patient's symptoms.  Plan is to discharge patient back home with her sister and continue Ativan as needed Because of lack of improvement/extremely poor oral intake during the hospitalization PEG tube which was placed on 8/29.  PEG can be continued in 4 to 6 weeks from date of insertion  Poor oral intake 2/2 fixed delusions/Malnutrition/Obesity PEG tube placed on 8/29 -can remove in 4 to 6 weeks after discharge Eating better   Seizure disorder POA Continue keppra -dose lowered this admission  Physical deconditioning secondary to fixed delusion PT/OT aware of discharge plan to home.  Focus will be on stair navigation and increasing mobility-better able to participate now that psychiatric status is stabilized   Diabetes melitis 2 uncontrolled on long-term insulin CBGs stable off of long-acting insulin at this juncture Continue SSI  Hypokalemia Hypomagnesemia Resolved         Data Reviewed: Basic Metabolic Panel: Recent Labs  Lab 02/18/21 0308 02/19/21 0313 02/20/21 0129  NA  --   --  135  K  --   --  4.1  CL  --   --  101  CO2  --   --  28  GLUCOSE  --   --  182*  BUN  --   --  12  CREATININE  --   --  0.59  CALCIUM  --   --  9.4  MG 2.2 2.2 2.2  PHOS 4.0 3.8 4.2   Liver Function Tests: Recent Labs  Lab 02/20/21 0129  AST 59*  ALT 58*  ALKPHOS 71  BILITOT <0.1*  PROT 6.4*  ALBUMIN  2.9*   No results for input(s): LIPASE, AMYLASE in the last 168 hours. No results for input(s): AMMONIA in the last 168 hours. CBC: Recent Labs  Lab 02/20/21 0129  WBC 9.3  HGB 11.4*  HCT 36.1  MCV 91.9  PLT 381    CBG: Recent Labs  Lab 02/23/21 0837 02/23/21 1150 02/23/21 1637 02/23/21 2132 02/24/21 0617  GLUCAP 191* 231* 166* 156* 191*    Scheduled Meds:  benztropine  0.5 mg Oral Daily   enoxaparin (LOVENOX) injection  40 mg Subcutaneous Q24H   feeding supplement (GLUCERNA SHAKE)  237 mL Oral TID BM   free water  100 mL Per Tube Q8H   insulin aspart  0-15 Units Subcutaneous TID WC   levETIRAcetam  1,000 mg Oral BID   multivitamin with minerals  1 tablet Oral Daily   valACYclovir  500 mg Oral QHS      Principal Problem:   Acute encephalopathy Active Problems:   Hypoglycemia   Constipation   Bipolar disorder (HCC)   Diabetes (HCC)   AMS (altered mental status)   Poor appetite   Psychogenic loss of appetite   Intellectual delay   Consultants: Neurology Psychiatry  Procedures: EEG Long-term EEG  Antibiotics: Valacyclovir 8/15 through 8/28   Time spent: 15 minutes    Junious Silk ANP  [facility] 7 am - 330 pm/M-F for direct patient care and secure chat Please refer to Amion for contact info 30  days

## 2021-02-24 NOTE — Plan of Care (Signed)
  Problem: Clinical Measurements: Goal: Will remain free from infection 02/24/2021 0431 by Thom Chimes, RN Outcome: Progressing 02/24/2021 0431 by Thom Chimes, RN Outcome: Progressing Goal: Diagnostic test results will improve Outcome: Progressing Goal: Respiratory complications will improve 02/24/2021 0431 by Thom Chimes, RN Outcome: Progressing 02/24/2021 0431 by Thom Chimes, RN Outcome: Progressing Goal: Cardiovascular complication will be avoided 02/24/2021 0431 by Thom Chimes, RN Outcome: Progressing 02/24/2021 0431 by Thom Chimes, RN Outcome: Progressing   Problem: Activity: Goal: Risk for activity intolerance will decrease Outcome: Progressing

## 2021-02-24 NOTE — Progress Notes (Signed)
Occupational Therapy Treatment Patient Details Name: [name] MRN: [id number] DOB: January 09, 1966 Today's Date: 02/24/2021    History of present illness 55 y.o. female admitted for neurological evaluation of suspected encephalopathy.  However, brain imaging was normal, EEG unremarkable, UA negative, urine drug screen negative, procalcitonin low, B12, ammonia, and TSH normal.  Acute metabolic encephalopathy was ruled out. Progressive mental decline over previous 2 days so another EEG and CT head performed 8/30 with normal results, no sign of seizures. Improving mental status 02/18/21. PMH: intellectual disability, bipolar disorder, seizure disorder, type II diabetes, hypertension, hyperlipidemia, immune deficiency disorder.   OT comments  Patient seen by skilled OT to address bed mobility, functional mobility, and self care.  Patient required vcs and min assist to get to eob and min guard assist with vcs for mobility to get to sink.  Patient performed grooming and UB bathing standing at sink and completed self care seated.  Patient demonstrated improvement with participation of self care.  Acute OT to continue to follow.   Follow Up Recommendations  SNF    Equipment Recommendations  3 in 1 bedside commode;Tub/shower seat    Recommendations for Other Services      Precautions / Restrictions Precautions Precautions: Fall Precaution Comments: G tube and abdominal binder in place, briefs helpful Restrictions Weight Bearing Restrictions: No       Mobility Bed Mobility Overal bed mobility: Needs Assistance Bed Mobility: Supine to Sit     Supine to sit: Min assist;HOB elevated Sit to supine: Min assist;HOB elevated   General bed mobility comments: vcs to use rails, assistance needed with LEs    Transfers Overall transfer level: Needs assistance Equipment used: Rolling walker (2 wheeled) Transfers: Sit to/from Stand Sit to Stand: Min guard;Min assist         General transfer  comment: vcs for safety and with correct walker use    Balance Overall balance assessment: Needs assistance Sitting-balance support: Feet supported Sitting balance-Leahy Scale: Fair     Standing balance support: Single extremity supported Standing balance-Leahy Scale: Poor Standing balance comment: performed grooming and UB bathing standing at sink                           ADL either performed or assessed with clinical judgement   ADL Overall ADL's : Needs assistance/impaired     Grooming: Wash/dry hands;Wash/dry face;Min guard;Standing Grooming Details (indicate cue type and reason): need cues for sequencing. Upper Body Bathing: Min guard;Standing;Cueing for sequencing Upper Body Bathing Details (indicate cue type and reason): performed standing at sink Lower Body Bathing: Minimal assistance;Cueing for sequencing;Sitting/lateral leans Lower Body Bathing Details (indicate cue type and reason): bathed legs and feet seated Upper Body Dressing : Minimal assistance;Cueing for sequencing;Sitting Upper Body Dressing Details (indicate cue type and reason): donned gown seated Lower Body Dressing: Minimal assistance;Cueing for sequencing;Sitting/lateral leans Lower Body Dressing Details (indicate cue type and reason): doffed and donned footwear             Functional mobility during ADLs: Min guard;Rolling walker General ADL Comments: vcs for safety     Vision       Perception     Praxis      Cognition Arousal/Alertness: Awake/alert Behavior During Therapy: WFL for tasks assessed/performed Overall Cognitive Status: History of cognitive impairments - at baseline Area of Impairment: Memory;Safety/judgement;Awareness;Problem solving                 Orientation Level: Disoriented  to;Situation;Time Current Attention Level: Sustained Memory: Decreased recall of precautions Following Commands: Follows one step commands consistently;Follows one step commands  with increased time Safety/Judgement: Decreased awareness of deficits;Decreased awareness of safety Awareness: Intellectual Problem Solving: Slow processing General Comments: Followed one step commands with self care and transfers        Exercises     Shoulder Instructions       General Comments      Pertinent Vitals/ Pain       Pain Assessment: Faces Faces Pain Scale: Hurts a little bit Pain Location: IV site on hand Pain Descriptors / Indicators: Grimacing;Guarding Pain Intervention(s): Monitored during session  Home Living                                          Prior Functioning/Environment              Frequency  Min 2X/week        Progress Toward Goals  OT Goals(current goals can now be found in the care plan section)  Progress towards OT goals: Progressing toward goals  Acute Rehab OT Goals Patient Stated Goal: to get stronger OT Goal Formulation: With patient Time For Goal Achievement: 02/25/21 Potential to Achieve Goals: Good ADL Goals Pt Will Perform Grooming: with min guard assist;sitting Pt Will Perform Upper Body Bathing: with min assist;sitting Pt Will Perform Upper Body Dressing: with min assist;sitting Additional ADL Goal #1: Patient will be Min A of one for supine to sit to increase independence with upper body ADL  Plan Discharge plan remains appropriate    Co-evaluation                 AM-PAC OT "6 Clicks" Daily Activity     Outcome Measure   Help from another person eating meals?: None Help from another person taking care of personal grooming?: A Little Help from another person toileting, which includes using toliet, bedpan, or urinal?: A Lot Help from another person bathing (including washing, rinsing, drying)?: A Little Help from another person to put on and taking off regular upper body clothing?: A Little Help from another person to put on and taking off regular lower body clothing?: A Little 6 Click  Score: 18    End of Session Equipment Utilized During Treatment: Gait belt;Rolling walker  OT Visit Diagnosis: Other symptoms and signs involving cognitive function;Muscle weakness (generalized) (M62.81) Pain - part of body: Arm;Shoulder;Leg;Ankle and joints of foot;Knee   Activity Tolerance Patient tolerated treatment well   Patient Left in bed;with call bell/phone within reach;with bed alarm set   Nurse Communication Mobility status        Time: 0347-4259 OT Time Calculation (min): 18 min  Charges: OT General Charges $OT Visit: 1 Visit OT Treatments $Self Care/Home Management : 8-22 mins  Alfonse Flavors, OTA    [name] 02/24/2021, 10:24 AM

## 2021-02-24 NOTE — Plan of Care (Signed)
  Problem: Education: Goal: Knowledge of General Education information will improve Description: Including pain rating scale, medication(s)/side effects and non-pharmacologic comfort measures Outcome: Progressing   Problem: Health Behavior/Discharge Planning: Goal: Ability to manage health-related needs will improve Outcome: Progressing   Problem: Clinical Measurements: Goal: Ability to maintain clinical measurements within normal limits will improve Outcome: Progressing Goal: Will remain free from infection Outcome: Progressing Goal: Diagnostic test results will improve Outcome: Progressing Goal: Respiratory complications will improve Outcome: Progressing Goal: Cardiovascular complication will be avoided Outcome: Progressing   Problem: Activity: Goal: Risk for activity intolerance will decrease Outcome: Progressing   Problem: Nutrition: Goal: Adequate nutrition will be maintained Outcome: Progressing   Problem: Coping: Goal: Level of anxiety will decrease Outcome: Progressing   Problem: Elimination: Goal: Will not experience complications related to bowel motility Outcome: Progressing Goal: Will not experience complications related to urinary retention Outcome: Progressing   Problem: Pain Managment: Goal: General experience of comfort will improve Outcome: Progressing   Problem: Safety: Goal: Ability to remain free from injury will improve Outcome: Progressing   Problem: Skin Integrity: Goal: Risk for impaired skin integrity will decrease Outcome: Progressing   Problem: Safety: Goal: Non-violent Restraint(s) Outcome: Progressing   Problem: Education: Goal: Expressions of having a comfortable level of knowledge regarding the disease process will increase Outcome: Progressing   Problem: Coping: Goal: Ability to adjust to condition or change in health will improve Outcome: Progressing Goal: Ability to identify appropriate support needs will  improve Outcome: Progressing   Problem: Health Behavior/Discharge Planning: Goal: Compliance with prescribed medication regimen will improve Outcome: Progressing   Problem: Medication: Goal: Risk for medication side effects will decrease Outcome: Progressing   Problem: Clinical Measurements: Goal: Complications related to the disease process, condition or treatment will be avoided or minimized Outcome: Progressing Goal: Diagnostic test results will improve Outcome: Progressing   Problem: Safety: Goal: Verbalization of understanding the information provided will improve Outcome: Progressing   Problem: Self-Concept: Goal: Level of anxiety will decrease Outcome: Progressing Goal: Ability to verbalize feelings about condition will improve Outcome: Progressing   

## 2021-02-24 NOTE — Plan of Care (Signed)
  Problem: Clinical Measurements: Goal: Will remain free from infection Outcome: Progressing Goal: Diagnostic test results will improve Outcome: Progressing Goal: Respiratory complications will improve Outcome: Progressing Goal: Cardiovascular complication will be avoided Outcome: Progressing   

## 2021-02-24 NOTE — TOC Progression Note (Signed)
Transition of Care [facility]) - Progression Note    Patient Details  Name: [name] MRN: [id number] Date of Birth: 03-25-1966  [facility] CM/SW Contact  Janae Bridgeman, RN Phone Number: 02/24/2021, 8:27 AM  Clinical Narrative:    CM called and spoke with Werner Lean, CM at [facility] and requested PT/OT home health services for home.  [place], CM will check with the office to inquire about services for home.  CM and MSW with DTP Team will continue to follow the patient for discharge planning for home with the patient's sister.   Expected Discharge Plan: Home/Self Care Barriers to Discharge: Continued Medical Work up, Psych Bed not available  Expected Discharge Plan and Services Expected Discharge Plan: Home/Self Care In-house Referral: Clinical Social Work, PCP / Production designer, theatre/television/film Services: CM Consult Post Acute Care Choice: Resumption of Svcs/PTA Provider Living arrangements for the past 2 months: Single Family Home                 DME Arranged:  (pending need after PT/OT evaluation)                     Social Determinants of Health (SDOH) Interventions    Readmission Risk Interventions No flowsheet data found.

## 2021-02-24 NOTE — Progress Notes (Signed)
Physical Therapy Treatment Patient Details Name: [name] MRN: [id number] DOB: November 23, 1965 Today's Date: 02/24/2021    History of Present Illness Pt is a 55 y/o female admitted for neurological evaluation of suspected encephalopathy.  However, brain imaging was normal, EEG unremarkable, UA negative, urine drug screen negative, procalcitonin low, B12, ammonia, and TSH normal.  Acute metabolic encephalopathy was ruled out. Progressive mental decline over previous 2 days so another EEG and CT head performed 8/30 with normal results, no sign of seizures. Improving mental status 02/18/21. PMH: intellectual disability, bipolar disorder, seizure disorder, type II diabetes, hypertension, hyperlipidemia, immune deficiency disorder.    PT Comments    Pt progressing with mobility. Acute PT goals met and all were updated this date to reflect current level of function. Progressed to using a rollator by end of session, and overall appeared steady and comfortable without overt LOB. Pt was able to ambulate ~30' without an AD but she reports she prefers the rollator. Pt asked to wash face at the sink prior to end of session, and required some cues to initiate task even after she had washcloth in hand. Noted pt had an untouched breakfast and lunch tray in her room and pt reports not wanting anything on either tray. When asked what pt likes to eat at home, she excitedly reports "peanut butter and jelly sandwich." PT ordered pt a PB&J from the cafeteria, on schedule to be delivered at 1415 this afternoon. At this time, pt appropriate for HHPT follow up with sister's supervision at home. Will continue to follow.    Follow Up Recommendations  Home health PT;Supervision/Assistance - 24 hour     Equipment Recommendations  Other (comment) (Rollator)    Recommendations for Other Services       Precautions / Restrictions Precautions Precautions: Fall Precaution Comments: PEG tube Restrictions Weight Bearing  Restrictions: No    Mobility  Bed Mobility Overal bed mobility: Modified Independent Bed Mobility: Supine to Sit     Supine to sit: HOB elevated;Modified independent (Device/Increase time) Sit to supine: Modified independent (Device/Increase time);HOB elevated   General bed mobility comments: No assist required. Pt was able to transition to/from EOB without difficulty. Increased time.    Transfers Overall transfer level: Needs assistance Equipment used: Rolling walker (2 wheeled) Transfers: Sit to/from Stand Sit to Stand: Supervision Stand pivot transfers: Supervision       General transfer comment: No assist required. Light supervision provided for safety.  Ambulation/Gait Ambulation/Gait assistance: Min guard;Modified independent (Device/Increase time);Supervision Gait Distance (Feet): 300 Feet Assistive device: Rolling walker (2 wheeled);4-wheeled walker;None Gait Pattern/deviations: Decreased stride length;Step-through pattern;Narrow base of support Gait velocity: Decreased Gait velocity interpretation: 1.31 - 2.62 ft/sec, indicative of limited community ambulator General Gait Details: Initially with RW for support. Progressed to the rollator and pt was able to improve gait speed and overall appeared more comfortable with rollator. She achieved a mod I level with rollator. At end of gait training attempted without AD, and pt was able to ambualte fairly well with supervision for safety.   Stairs Stairs: Yes Stairs assistance: Supervision Stair Management: Two rails;Step to pattern;Forwards Number of Stairs: 5 (x2) General stair comments: pt ascended/descended 5 steps of varying heights (3" and 7") in PT gym x2 trials   Wheelchair Mobility    Modified Rankin (Stroke Patients Only)       Balance Overall balance assessment: Needs assistance Sitting-balance support: Feet supported Sitting balance-Leahy Scale: Fair     Standing balance support: Single extremity  supported  Standing balance-Leahy Scale: Fair                              Cognition Arousal/Alertness: Awake/alert Behavior During Therapy: WFL for tasks assessed/performed Overall Cognitive Status: History of cognitive impairments - at baseline Area of Impairment: Memory;Safety/judgement;Awareness;Problem solving                 Orientation Level: Disoriented to;Situation;Time Current Attention Level: Sustained Memory: Decreased recall of precautions Following Commands: Follows one step commands consistently;Follows one step commands with increased time Safety/Judgement: Decreased awareness of deficits;Decreased awareness of safety Awareness: Intellectual Problem Solving: Slow processing General Comments: Required increased cues to initiate tasks such as washing her face.      Exercises      General Comments        Pertinent Vitals/Pain Pain Assessment: Faces Faces Pain Scale: Hurts a little bit Pain Location: IV site on hand Pain Descriptors / Indicators: Grimacing;Guarding Pain Intervention(s): Limited activity within patient's tolerance;Monitored during session;Repositioned    Home Living                      Prior Function            PT Goals (current goals can now be found in the care plan section) Acute Rehab PT Goals Patient Stated Goal: to get stronger PT Goal Formulation: Patient unable to participate in goal setting Time For Goal Achievement: 03/03/21 Potential to Achieve Goals: Good Progress towards PT goals: Goals met and updated - see care plan    Frequency    Min 3X/week      PT Plan Current plan remains appropriate;Frequency needs to be updated    Co-evaluation              AM-PAC PT "6 Clicks" Mobility   Outcome Measure  Help needed turning from your back to your side while in a flat bed without using bedrails?: None Help needed moving from lying on your back to sitting on the side of a flat bed without  using bedrails?: None Help needed moving to and from a bed to a chair (including a wheelchair)?: A Little Help needed standing up from a chair using your arms (e.g., wheelchair or bedside chair)?: A Little Help needed to walk in hospital room?: A Little Help needed climbing 3-5 steps with a railing? : A Little 6 Click Score: 20    End of Session Equipment Utilized During Treatment: Gait belt Activity Tolerance: Patient tolerated treatment well Patient left: in bed;with call bell/phone within reach;with bed alarm set Nurse Communication: Mobility status PT Visit Diagnosis: Other abnormalities of gait and mobility (R26.89);Other symptoms and signs involving the nervous system (J62.836)     Time: 6294-7654 PT Time Calculation (min) (ACUTE ONLY): 24 min  Charges:  $Gait Training: 23-37 mins                     Rolinda Roan, PT, DPT Acute Rehabilitation Services Pager: [phone] Office: [phone]    Thelma Comp 02/24/2021, 2:17 PM

## 2021-02-25 DIAGNOSIS — E876 Hypokalemia: Secondary | ICD-10-CM | POA: Diagnosis not present

## 2021-02-25 LAB — GLUCOSE, CAPILLARY
Glucose-Capillary: 180 mg/dL — ABNORMAL HIGH (ref 70–99)
Glucose-Capillary: 212 mg/dL — ABNORMAL HIGH (ref 70–99)
Glucose-Capillary: 250 mg/dL — ABNORMAL HIGH (ref 70–99)
Glucose-Capillary: 302 mg/dL — ABNORMAL HIGH (ref 70–99)
Glucose-Capillary: 125 mg/dL — ABNORMAL HIGH (ref 70–99)

## 2021-02-25 MED ORDER — HYDROCERIN EX CREA
TOPICAL_CREAM | Freq: Two times a day (BID) | CUTANEOUS | Status: DC
  Administered 2021-02-25 – 2021-03-25 (×4): 1 via TOPICAL
  Filled 2021-02-25: qty 113

## 2021-02-25 NOTE — TOC Progression Note (Addendum)
Transition of Care [facility]) - Progression Note    Patient Details  Name: [name] MRN: [id number] Date of Birth: 02-22-1966  Transition of Care [facility]) CM/SW Contact  Janae Bridgeman, RN Phone Number: 02/25/2021, 8:48 AM  Clinical Narrative:    CM called and left a message with Werner Lean, CM with [facility] to check on availability of home health services for the patient regarding PT/OT needs for the patient at home.  [place]am, CM with [facility] is checking with the office for availability of services.  CM called and left a voice message with the patient's sister, [name] asking that she return my call regarding equipment needs at the home.  Will follow up with the family regarding equipment and I will call to have [employer] deliver dme to the patient's room.  02/25/2021 1305 - CM called to find accepting home health agency and was unable to find an agency to offer services due to staffing issues in [place].  The following HH agencies were called including:  [facility] - No [facility] - No [facility] - No [facility] - No - Will call back next week to check on staffing and delay [facility] - No [facility] - No [facility] - left message [facility] - No [facility] - No [facility] - left message  I will speak with the patient's sister today at 2 pm to discuss transitions to home, outpatient PT possibility and dme needs for home for possible pending discharge next week.  CM and MSW with DTP Team will continue to follow the patient for discharge needs for home.  Patient is continuing to work with PT/OT regarding step navigation and use of rolling walker and equipment.     Expected Discharge Plan: Home/Self Care Barriers to Discharge: Continued Medical Work up, Psych Bed not available  Expected Discharge Plan and Services Expected Discharge Plan: Home/Self Care In-house Referral: Clinical Social Work, PCP / Production designer, theatre/television/film Services: CM  Consult Post Acute Care Choice: Resumption of Svcs/PTA Provider Living arrangements for the past 2 months: Single Family Home                 DME Arranged:  (pending need after PT/OT evaluation)                     Social Determinants of Health (SDOH) Interventions    Readmission Risk Interventions No flowsheet data found.

## 2021-02-25 NOTE — Progress Notes (Addendum)
[facility] PROGRESS NOTE  [name] VHQ:[id number] DOB: 1966-04-04 DOA: 01/23/2021 PCP: Mirna Mires, MD  Status: Remains inpatient appropriate because:Altered mental status, Unsafe d/c plan, IV treatments appropriate due to intensity of illness or inability to take PO, and Inpatient level of care appropriate due to severity of illness  Dispo: The patient is from: Home              Anticipated d/c is to: Home:               Patient currently is not medically stable to d/c.Marland Kitchen  Now that she has awakened need to ensure that patient can mobilize appropriately and navigate stairs before discharge home.   Difficult to place patient Yes   Level of care: Med-Surg  Code Status: Full Family Communication: Sister [name] 9/9 DVT prophylaxis: Lovenox COVID vaccination status: Unknown   HPI: 55 y.o. F with DM, HTN, developmental delay who presented with decreased oral intake and "saying bizarre things".   In the ER, CT head unremarkable.  She was hypoglycemic and otherwise had normal chemistries and hemogram.  Admitted for evaluation of erratic behavior.  Subjective: Patient awake and alert.  Lying partially upright on right side in bed.  Only ate a small amount of her breakfast.  Tried to eat some more bacon but decided she did not like it because it was not crisp.  Patient reports dry skin on her legs.  Objective: Vitals:   02/24/21 1959 02/25/21 0432  BP: (!) 118/54 107/76  Pulse: 86   Resp: 17 18  Temp: 98 F (36.7 C) 98.2 F (36.8 C)  SpO2: 98% 100%    Intake/Output Summary (Last 24 hours) at 02/25/2021 0736 Last data filed at 02/24/2021 2220 Gross per 24 hour  Intake 1154 ml  Output 220 ml  Net 934 ml   Filed Weights   02/19/21 0500 02/20/21 0432 02/22/21 0651  Weight: 73.7 kg 73.5 kg 71.6 kg    Exam: Constitutional: NAD, calm, alert Respiratory: Lungs clear, no increased work of breathing, normal pulse oximetry readings room air. Cardiac: S1-S2, no peripheral  edema, normotensive, regular pulse Abdomen: PEG tube placed 8/29. LBM 9/07 abdomen soft nontender nondistended.  Has an appetite but has very narrow food preferences so many times does not eat food on her plate.  Has preference for PB&J sandwiches Neurologic: Cranial nerves II through XII are intact.  Patient moving all extremities equally with strength 4-5/5.  Sensation intact. Psychiatric: Awake and oriented x3.  Interactive. 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 Ativan initiated during the hospitalization with significant improvement in patient's symptoms.  Plan is to discharge patient back home with her sister and continue Ativan as needed Because of lack of improvement/extremely poor oral intake during the hospitalization PEG tube which was placed on 8/29.  PEG can be continued in 4 to 6 weeks from date of insertion  Poor oral intake 2/2 fixed delusions/Malnutrition/Obesity PEG tube placed on 8/29 -can remove in 4 to 6 weeks after discharge Eating better-have asked nursing staff to obtain patient's preferences in order that she will eat better.  Last night she ate 100% of a PB&J sandwich when it was determined that is her favorite meal.   Seizure disorder POA Continue keppra -dose lowered this admission  Physical deconditioning secondary to fixed delusion PT/OT aware of discharge plan to home.  Focus will be on stair navigation and increasing mobility-better able to participate now that psychiatric status is stabilized In process of attempting to arrange home health services.  Need to determine from sister how many stairs patient must navigate and whether or not patient can return to the day  program while utilizing a rolling walker.  The sister works during the day and depends on the day program to manage the patient while she is at work.   Diabetes melitis 2 uncontrolled on long-term insulin CBGs stable off of long-acting insulin at this juncture Continue SSI  Hypokalemia Hypomagnesemia Resolved         Data Reviewed: Basic Metabolic Panel: Recent Labs  Lab 02/19/21 0313 02/20/21 0129  NA  --  135  K  --  4.1  CL  --  101  CO2  --  28  GLUCOSE  --  182*  BUN  --  12  CREATININE  --  0.59  CALCIUM  --  9.4  MG 2.2 2.2  PHOS 3.8 4.2   Liver Function Tests: Recent Labs  Lab 02/20/21 0129  AST 59*  ALT 58*  ALKPHOS 71  BILITOT <0.1*  PROT 6.4*  ALBUMIN 2.9*   No results for input(s): LIPASE, AMYLASE in the last 168 hours. No results for input(s): AMMONIA in the last 168 hours. CBC: Recent Labs  Lab 02/20/21 0129  WBC 9.3  HGB 11.4*  HCT 36.1  MCV 91.9  PLT 381    CBG: Recent Labs  Lab 02/23/21 2132 02/24/21 0617 02/24/21 1118 02/24/21 1636 02/25/21 0610  GLUCAP 156* 191* 183* 164* 180*    Scheduled Meds:  benztropine  0.5 mg Oral Daily   enoxaparin (LOVENOX) injection  40 mg Subcutaneous Q24H   feeding supplement (GLUCERNA SHAKE)  237 mL Oral TID BM   free water  100 mL Per Tube Q8H   insulin aspart  0-15 Units Subcutaneous TID WC   levETIRAcetam  1,000 mg Oral BID   multivitamin with minerals  1 tablet Oral Daily   valACYclovir  500 mg Oral QHS      Principal Problem:   Acute encephalopathy Active Problems:   Hypoglycemia   Constipation   Bipolar disorder (HCC)   Diabetes (HCC)   AMS (altered mental status)   Poor appetite   Psychogenic loss of appetite   Intellectual delay   Consultants: Neurology Psychiatry  Procedures: EEG Long-term EEG  Antibiotics: Valacyclovir 8/15 through 8/28   Time spent: 15 minutes    Junious Silk ANP  [facility] 7 am - 330 pm/M-F for direct patient care and  secure chat Please refer to Amion for contact info 31  days

## 2021-02-25 NOTE — TOC Progression Note (Signed)
Transition of Care [facility]) - Progression Note    Patient Details  Name: [name] MRN: [id number] Date of Birth: 1965/11/27  Transition [facility]) CM/SW Contact  Janae Bridgeman, RN Phone Number: 02/25/2021, 2:29 PM  Clinical Narrative:    Case management spoke with the patient's sister, [name] and the sister states that the patient would have to climb 12-13 steps in the apartment where she is currently living.  The sister states that she plans to move to a downstairs apartment or home with 1-level house.  The patient currently does not have dme at home and she will need rollator, 3:1, and tub bench for the home.  I explained to the sister that I was unable to find home health services for PT/OT at this time and would have to place an outpatient PT/OT referral.  The sister states that she would make arrangements to provide transportation at this time.  Junious Silk, NP is aware and the patient will remain hospitalized for the next week until she is able to safely navigate steps at the home since home health services are not available to the patient at this time.  CM and MSW with DTP will continue to follow the patient for transitions of care needs for home with outpatient PT/OT at this time.   Expected Discharge Plan: Home/Self Care Barriers to Discharge: Continued Medical Work up, Psych Bed not available  Expected Discharge Plan and Services Expected Discharge Plan: Home/Self Care In-house Referral: Clinical Social Work, PCP / Production designer, theatre/television/film Services: CM Consult Post Acute Care Choice: Resumption of Svcs/PTA Provider Living arrangements for the past 2 months: Single Family Home                 DME Arranged:  (pending need after PT/OT evaluation)                     Social Determinants of Health (SDOH) Interventions    Readmission Risk Interventions No flowsheet data found.

## 2021-02-26 DIAGNOSIS — K5900 Constipation, unspecified: Secondary | ICD-10-CM

## 2021-02-26 LAB — GLUCOSE, CAPILLARY
Glucose-Capillary: 222 mg/dL — ABNORMAL HIGH (ref 70–99)
Glucose-Capillary: 209 mg/dL — ABNORMAL HIGH (ref 70–99)
Glucose-Capillary: 242 mg/dL — ABNORMAL HIGH (ref 70–99)
Glucose-Capillary: 240 mg/dL — ABNORMAL HIGH (ref 70–99)
Glucose-Capillary: 305 mg/dL — ABNORMAL HIGH (ref 70–99)

## 2021-02-26 NOTE — Progress Notes (Signed)
PROGRESS NOTE    [name]  WEX:[id number] DOB: April 23, 1966 DOA: 01/23/2021 PCP: Mirna Mires, MD    Brief Narrative:  55 y.o. F with DM, HTN, developmental delay who presented with decreased oral intake and "saying bizarre things".   In the ER, CT head unremarkable.  She was hypoglycemic and otherwise had normal chemistries and hemogram.  Admitted for evaluation of erratic behavior.  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 Ativan initiated during the hospitalization with significant improvement in patient's symptoms.  Plan is to discharge patient back home with her sister and continue Ativan as needed Because of lack of improvement/extremely poor oral intake during the hospitalization PEG tube which was placed on 8/29.  PEG can be continued in 4 to 6 weeks from date of insertion -Stable this AM without complaints, watching TV   Poor oral intake 2/2 fixed delusions/Malnutrition/Obesity PEG tube placed on 8/29 -can remove in 4 to 6 weeks after discharge Noted to be eating better-Allison Rennis Harding had asked nursing staff to obtain patient's preferences in order that she will eat better.    Seizure disorder POA Continue keppra -dose lowered this admission   Physical deconditioning secondary to fixed delusion PT/OT aware of discharge plan to home.  Focus will be on stair navigation and increasing mobility-better able to participate now that psychiatric status is stabilized In process of attempting to  arrange home health services.  Need to determine from sister how many stairs patient must navigate and whether or not patient can return to the day program while utilizing a rolling walker.  The sister works during the day and depends on the day program to manage the patient while she is at work.   Diabetes melitis 2 uncontrolled on long-term insulin CBGs stable off of long-acting insulin at this juncture Continue SSI as tolerated  Hypokalemia Hypomagnesemia Resolved     DVT prophylaxis: Lovenox subq Code Status: Full Family Communication: Pt in room, family not at bedside  Status is: Inpatient  Remains inpatient appropriate because:Unsafe d/c plan  Dispo: The patient is from: Home              Anticipated d/c is to: Home              Patient currently is not medically stable to d/c.   Difficult to place patient Yes   Consultants:  Neurology Psychiatry  Procedures:  EEG Long-term EEG  Antimicrobials: Anti-infectives (From admission, onward)    Start     Dose/Rate Route Frequency Ordered Stop   02/23/21 2200  valACYclovir (VALTREX) tablet 500 mg        500 mg Oral Daily at bedtime 02/23/21 1621     02/15/21 2200  valACYclovir (VALTREX) tablet 500 mg  Status:  Discontinued        500 mg Per Tube Daily at bedtime 02/14/21 1145 02/23/21 1621   02/14/21 0916  vancomycin (VANCOCIN) IVPB 1000 mg/200 mL premix        over 60 Minutes Intravenous Continuous PRN 02/14/21 0916 02/14/21 0916   01/31/21 2215  valACYclovir (VALTREX) tablet 500 mg  Status:  Discontinued        500 mg Per Tube Daily at bedtime 01/31/21 2119 02/14/21 1145   01/25/21 2200  valACYclovir (VALTREX) tablet 500 mg  Status:  Discontinued        500 mg Oral Daily at bedtime 01/25/21 0930 01/31/21 2119       Subjective: Without complaints today  Objective: Vitals:   02/26/21 0000 02/26/21 0400 02/26/21 0844 02/26/21 1218  BP: (!) 152/96 135/75 (!) 145/80 (!) 150/81  Pulse: 95 98 (!) 102 (!) 102  Resp:  16 16 18 16   Temp: 97.9 F (36.6 C) 98.1 F (36.7 C) (!) 97.5 F (36.4 C) 97.7 F (36.5 C)  TempSrc: Oral Oral Oral Oral  SpO2: 99% 98% 97% 98%  Weight:       No intake or output data in the 24 hours ending 02/26/21 1547 Filed Weights   02/19/21 0500 02/20/21 0432 02/22/21 0651  Weight: 73.7 kg 73.5 kg 71.6 kg    Examination: General exam: Awake, laying in bed, in nad Respiratory system: Normal respiratory effort, no wheezing  Data Reviewed: I have personally reviewed following labs and imaging studies  CBC: Recent Labs  Lab 02/20/21 0129  WBC 9.3  HGB 11.4*  HCT 36.1  MCV 91.9  PLT 381   Basic Metabolic Panel: Recent Labs  Lab 02/20/21 0129  NA 135  K 4.1  CL 101  CO2 28  GLUCOSE 182*  BUN 12  CREATININE 0.59  CALCIUM 9.4  MG 2.2  PHOS 4.2   GFR: Estimated Creatinine Clearance: 71 mL/min (by C-G formula based on SCr of 0.59 mg/dL). Liver Function Tests: Recent Labs  Lab 02/20/21 0129  AST 59*  ALT 58*  ALKPHOS 71  BILITOT <0.1*  PROT 6.4*  ALBUMIN 2.9*   No results for input(s): LIPASE, AMYLASE in the last 168 hours. No results for input(s): AMMONIA in the last 168 hours. Coagulation Profile: No results for input(s): INR, PROTIME in the last 168 hours. Cardiac Enzymes: No results for input(s): CKTOTAL, CKMB, CKMBINDEX, TROPONINI in the last 168 hours. BNP (last 3 results) No results for input(s): PROBNP in the last 8760 hours. HbA1C: No results for input(s): HGBA1C in the last 72 hours. CBG: Recent Labs  Lab 02/25/21 1610 02/25/21 2119 02/26/21 0618 02/26/21 0802 02/26/21 1159  GLUCAP 302* 125* 222* 209* 242*   Lipid Profile: No results for input(s): CHOL, HDL, LDLCALC, TRIG, CHOLHDL, LDLDIRECT in the last 72 hours. Thyroid Function Tests: No results for input(s): TSH, T4TOTAL, FREET4, T3FREE, THYROIDAB in the last 72 hours. Anemia Panel: No results for input(s): VITAMINB12, FOLATE, FERRITIN, TIBC, IRON, RETICCTPCT in the last 72  hours. Sepsis Labs: No results for input(s): PROCALCITON, LATICACIDVEN in the last 168 hours.  No results found for this or any previous visit (from the past 240 hour(s)).   Radiology Studies: No results found.  Scheduled Meds:  benztropine  0.5 mg Oral Daily   enoxaparin (LOVENOX) injection  40 mg Subcutaneous Q24H   feeding supplement (GLUCERNA SHAKE)  237 mL Oral TID BM   free water  100 mL Per Tube Q8H   hydrocerin   Topical BID   insulin aspart  0-15 Units Subcutaneous TID WC   levETIRAcetam  1,000 mg Oral BID   multivitamin with minerals  1 tablet Oral Daily   valACYclovir  500 mg Oral QHS   Continuous Infusions:   LOS: 32 days   04/28/21, MD [facility] Pager On Amion  If 7PM-7AM, please contact night-coverage 02/26/2021, 3:47 PM

## 2021-02-27 ENCOUNTER — Inpatient Hospital Stay (HOSPITAL_COMMUNITY): Payer: Medicaid Other

## 2021-02-27 DIAGNOSIS — Z4659 Encounter for fitting and adjustment of other gastrointestinal appliance and device: Secondary | ICD-10-CM

## 2021-02-27 DIAGNOSIS — K5900 Constipation, unspecified: Secondary | ICD-10-CM | POA: Diagnosis not present

## 2021-02-27 LAB — COMPREHENSIVE METABOLIC PANEL
ALT: 48 U/L — ABNORMAL HIGH (ref 0–44)
AST: 36 U/L (ref 15–41)
Albumin: 3.5 g/dL (ref 3.5–5.0)
Alkaline Phosphatase: 71 U/L (ref 38–126)
Anion gap: 8 (ref 5–15)
BUN: 6 mg/dL (ref 6–20)
CO2: 25 mmol/L (ref 22–32)
Calcium: 9.7 mg/dL (ref 8.9–10.3)
Chloride: 101 mmol/L (ref 98–111)
Creatinine, Ser: 0.59 mg/dL (ref 0.44–1.00)
GFR, Estimated: >60 mL/min (ref >60)
Glucose, Bld: 243 mg/dL — ABNORMAL HIGH (ref 70–99)
Potassium: 4.1 mmol/L (ref 3.5–5.1)
Sodium: 134 mmol/L — ABNORMAL LOW (ref 135–145)
Total Bilirubin: 0.6 mg/dL (ref 0.3–1.2)
Total Protein: 7.3 g/dL (ref 6.5–8.1)

## 2021-02-27 LAB — GLUCOSE, CAPILLARY
Glucose-Capillary: 294 mg/dL — ABNORMAL HIGH (ref 70–99)
Glucose-Capillary: 246 mg/dL — ABNORMAL HIGH (ref 70–99)
Glucose-Capillary: 203 mg/dL — ABNORMAL HIGH (ref 70–99)
Glucose-Capillary: 433 mg/dL — ABNORMAL HIGH (ref 70–99)
Glucose-Capillary: 342 mg/dL — ABNORMAL HIGH (ref 70–99)
Glucose-Capillary: 277 mg/dL — ABNORMAL HIGH (ref 70–99)

## 2021-02-27 LAB — GLUCOSE, RANDOM: Glucose, Bld: 342 mg/dL — ABNORMAL HIGH (ref 70–99)

## 2021-02-27 LAB — MAGNESIUM: Magnesium: 2.1 mg/dL (ref 1.7–2.4)

## 2021-02-27 MED ORDER — LEVETIRACETAM 100 MG/ML PO SOLN
1000.0000 mg | Freq: Two times a day (BID) | ORAL | Status: DC
  Administered 2021-02-27 – 2021-03-25 (×52): 1000 mg via ORAL
  Filled 2021-02-27 (×52): qty 10

## 2021-02-27 MED ORDER — INSULIN GLARGINE-YFGN 100 UNIT/ML ~~LOC~~ SOLN
15.0000 [IU] | Freq: Every day | SUBCUTANEOUS | Status: DC
  Administered 2021-02-27 – 2021-02-28 (×2): 15 [IU] via SUBCUTANEOUS
  Filled 2021-02-27 (×3): qty 0.15

## 2021-02-27 MED ORDER — INSULIN ASPART 100 UNIT/ML IJ SOLN
15.0000 [IU] | Freq: Once | INTRAMUSCULAR | Status: AC
  Administered 2021-02-27: 15 [IU] via SUBCUTANEOUS

## 2021-02-27 NOTE — Progress Notes (Signed)
Rapid response RN called this morning at 0800 for a consult due to patient not responding to questions, staring off without response, not following commands. Rapid response nurse assessed the pt and discussed findings with MD, non-contrast CT performed, see results, no new orders.   See rapid response nurse note for additional details.

## 2021-02-27 NOTE — Progress Notes (Signed)
Pt's bed alarm not functioning properly, call to maintenance placed to resolve issue. Staff will continue to round on patient for safety.

## 2021-02-27 NOTE — Significant Event (Signed)
Rapid Response Event Note   Reason for Call :  Catatonia vs. Blank Stare  Initial Focused Assessment:  RN was in room with the patient this morning and asked her about her needing assistance with her breakfast and the patient had a blank stare. The RN was unable to get the patient to respond. When I started my exam, the patient responded and said "hey baby". She displayed no focal deficits on my exam. Visual fields are intact. Negative for aphasia. She correctly answered my object identification questions. She is negative for neglect. Motor movements are appropriate. Sensory is intact. Negative for ataxia.   At the end of my exam the patient told me that I needed to put her hand soft restraints back on because she gets into trouble. Code stroke not called due to no focal deficits. She has a history of seizures and was admitted for encephalopathy/AMS.   BP 129/62 HR 82 RR 17 CBG 247    Interventions:  NIH performed Neuro exam performed  Plan of Care:  Discussed the situation with the hospitalist and we both agree that a non-contrast CT is warranted. Labs ordered as well   Event Summary:   MD Notified: Dr. Rhona Leavens Call Time: [phone] Arrival Time: 0756 End Time: 0830  Andrey Spearman, RN

## 2021-02-27 NOTE — Plan of Care (Signed)
  Problem: Education: Goal: Knowledge of General Education information will improve Description: Including pain rating scale, medication(s)/side effects and non-pharmacologic comfort measures Outcome: Progressing   Problem: Health Behavior/Discharge Planning: Goal: Ability to manage health-related needs will improve Outcome: Progressing   Problem: Clinical Measurements: Goal: Ability to maintain clinical measurements within normal limits will improve Outcome: Progressing Goal: Will remain free from infection Outcome: Progressing Goal: Diagnostic test results will improve Outcome: Progressing Goal: Respiratory complications will improve Outcome: Progressing Goal: Cardiovascular complication will be avoided Outcome: Progressing   Problem: Activity: Goal: Risk for activity intolerance will decrease Outcome: Progressing   Problem: Nutrition: Goal: Adequate nutrition will be maintained Outcome: Progressing   Problem: Coping: Goal: Level of anxiety will decrease Outcome: Progressing   Problem: Elimination: Goal: Will not experience complications related to bowel motility Outcome: Progressing Goal: Will not experience complications related to urinary retention Outcome: Progressing   Problem: Pain Managment: Goal: General experience of comfort will improve Outcome: Progressing   Problem: Safety: Goal: Ability to remain free from injury will improve Outcome: Progressing   Problem: Skin Integrity: Goal: Risk for impaired skin integrity will decrease Outcome: Progressing   Problem: Safety: Goal: Non-violent Restraint(s) Outcome: Progressing   Problem: Education: Goal: Expressions of having a comfortable level of knowledge regarding the disease process will increase Outcome: Progressing   Problem: Coping: Goal: Ability to adjust to condition or change in health will improve Outcome: Progressing Goal: Ability to identify appropriate support needs will  improve Outcome: Progressing

## 2021-02-27 NOTE — Progress Notes (Signed)
PROGRESS NOTE    [name]  TXM:[id number] DOB: Sep 21, 1965 DOA: 01/23/2021 PCP: Mirna Mires, MD    Brief Narrative:  55 y.o. F with DM, HTN, developmental delay who presented with decreased oral intake and "saying bizarre things".   In the ER, CT head unremarkable.  She was hypoglycemic and otherwise had normal chemistries and hemogram.  Admitted for evaluation of erratic behavior.  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 Ativan initiated during the hospitalization with significant improvement in patient's symptoms.  Plan is to discharge patient back home with her sister and continue Ativan as needed Because of lack of improvement/extremely poor oral intake during the hospitalization PEG tube which was placed on 8/29.  PEG can be continued in 4 to 6 weeks from date of insertion -Overnight events noted. Patient noted to have "staring spells" prompting Rapid Response. Head CT w/o contrast ordered and reviewed, negative. Pt tracks and quickly turns head to watch people enter room   Poor oral intake 2/2 fixed delusions/Malnutrition/Obesity PEG tube placed on 8/29 -can remove in 4 to 6 weeks after discharge Noted to be eating better-[name] had asked nursing staff to obtain patient's preferences in order that she will eat better.    Seizure disorder POA Continue keppra -dose lowered this admission   Physical deconditioning secondary to fixed delusion PT/OT aware of discharge plan to home.   Focus will be on stair navigation and increasing mobility-better able to participate now that psychiatric status is stabilized In process of attempting to arrange home health services.  Need to determine from sister how many stairs patient must navigate and whether or not patient can return to the day program while utilizing a rolling walker.  The sister works during the day and depends on the day program to manage the patient while she is at work.   Diabetes melitis 2 uncontrolled on long-term insulin CBGs stable off of long-acting insulin at this juncture Continue SSI as tolerated  Hypokalemia Hypomagnesemia Resolved     DVT prophylaxis: Lovenox subq Code Status: Full Family Communication: Pt in room, family not at bedside  Status is: Inpatient  Remains inpatient appropriate because:Unsafe d/c plan  Dispo: The patient is from: Home              Anticipated d/c is to: Home              Patient currently is not medically stable to d/c.   Difficult to place patient Yes   Consultants:  Neurology Psychiatry  Procedures:  EEG Long-term EEG  Antimicrobials: Anti-infectives (From admission, onward)    Start     Dose/Rate Route Frequency Ordered Stop   02/23/21 2200  valACYclovir (VALTREX) tablet 500 mg        500 mg Oral Daily at bedtime 02/23/21 1621     02/15/21 2200  valACYclovir (VALTREX) tablet 500 mg  Status:  Discontinued        500 mg Per Tube Daily at bedtime 02/14/21 1145 02/23/21 1621   02/14/21 0916  vancomycin (VANCOCIN) IVPB 1000  mg/200 mL premix        over 60 Minutes Intravenous Continuous PRN 02/14/21 0916 02/14/21 0916   01/31/21 2215  valACYclovir (VALTREX) tablet 500 mg  Status:  Discontinued        500 mg Per Tube Daily at bedtime 01/31/21 2119 02/14/21 1145   01/25/21 2200  valACYclovir (VALTREX) tablet 500 mg  Status:  Discontinued        500 mg Oral Daily at bedtime 01/25/21 0930 01/31/21 2119       Subjective: Without complaints this AM.  Overnight events noted  Objective: Vitals:   02/27/21 0000 02/27/21 0400 02/27/21 0740 02/27/21 1121  BP: (!) 145/81 (!) 151/82 129/62 132/69  Pulse: 96 98 82 81  Resp: 18 18 17 18   Temp: 98.1 F (36.7 C) 98.4 F (36.9 C) 98.1 F (36.7 C) 98.7 F (37.1 C)  TempSrc: Oral Oral Oral Axillary  SpO2: 98% 99% 99% 98%  Weight:        Intake/Output Summary (Last 24 hours) at 02/27/2021 1529 Last data filed at 02/27/2021 0742 Gross per 24 hour  Intake --  Output 1100 ml  Net -1100 ml   Filed Weights   02/19/21 0500 02/20/21 0432 02/22/21 0651  Weight: 73.7 kg 73.5 kg 71.6 kg    Examination: Respiratory system: normal chest rise, clear, no audible wheezing Cardiovascular system: regular rhythm, s1-s2  Data Reviewed: I have personally reviewed following labs and imaging studies  CBC: No results for input(s): WBC, NEUTROABS, HGB, HCT, MCV, PLT in the last 168 hours.  Basic Metabolic Panel: Recent Labs  Lab 02/27/21 0820  NA 134*  K 4.1  CL 101  CO2 25  GLUCOSE 243*  BUN 6  CREATININE 0.59  CALCIUM 9.7  MG 2.1    GFR: Estimated Creatinine Clearance: 71 mL/min (by C-G formula based on SCr of 0.59 mg/dL). Liver Function Tests: Recent Labs  Lab 02/27/21 0820  AST 36  ALT 48*  ALKPHOS 71  BILITOT 0.6  PROT 7.3  ALBUMIN 3.5    No results for input(s): LIPASE, AMYLASE in the last 168 hours. No results for input(s): AMMONIA in the last 168 hours. Coagulation Profile: No results for input(s): INR, PROTIME in the last 168 hours. Cardiac Enzymes: No results for input(s): CKTOTAL, CKMB, CKMBINDEX, TROPONINI in the last 168 hours. BNP (last 3 results) No results for input(s): PROBNP in the last 8760 hours. HbA1C: No results for input(s): HGBA1C in the last 72 hours. CBG: Recent Labs  Lab 02/26/21 1651 02/26/21 2121 02/27/21 0646 02/27/21 0809 02/27/21 1131  GLUCAP 240* 305* 294* 246* 203*    Lipid Profile: No results for input(s): CHOL, HDL, LDLCALC,  TRIG, CHOLHDL, LDLDIRECT in the last 72 hours. Thyroid Function Tests: No results for input(s): TSH, T4TOTAL, FREET4, T3FREE, THYROIDAB in the last 72 hours. Anemia Panel: No results for input(s): VITAMINB12, FOLATE, FERRITIN, TIBC, IRON, RETICCTPCT in the last 72 hours. Sepsis Labs: No results for input(s): PROCALCITON, LATICACIDVEN in the last 168 hours.  No results found for this or any previous visit (from the past 240 hour(s)).   Radiology Studies: CT HEAD WO CONTRAST (04/29/21)  Result Date: 02/27/2021 CLINICAL DATA:  55 year old female with unexplained altered mental status. EXAM: CT HEAD WITHOUT CONTRAST TECHNIQUE: Contiguous axial images were obtained from the base of the skull through the vertex without intravenous contrast. COMPARISON:  Brain MRI 01/24/2021.  Head CT 02/15/2021. FINDINGS: Brain: Normal cerebral volume. No midline shift, ventriculomegaly, mass effect, evidence of mass lesion,  intracranial hemorrhage or evidence of cortically based acute infarction. Gray-white matter differentiation is within normal limits throughout the brain. Partially empty sella. Vascular: No suspicious intracranial vascular hyperdensity. Skull: Stable hyperostosis. Sinuses/Orbits: Visualized paranasal sinuses and mastoids are stable and well aerated. Small right maxillary mucous retention cysts. Other: Visualized orbits and scalp soft tissues are within normal limits. IMPRESSION: Stable and negative noncontrast head CT. Electronically Signed   By: Odessa Fleming M.D.   On: 02/27/2021 09:37    Scheduled Meds:  benztropine  0.5 mg Oral Daily   enoxaparin (LOVENOX) injection  40 mg Subcutaneous Q24H   feeding supplement (GLUCERNA SHAKE)  237 mL Oral TID BM   free water  100 mL Per Tube Q8H   hydrocerin   Topical BID   insulin aspart  0-15 Units Subcutaneous TID WC   levETIRAcetam  1,000 mg Oral BID   multivitamin with minerals  1 tablet Oral Daily   valACYclovir  500 mg Oral QHS   Continuous Infusions:    LOS: 33 days   Rickey Barbara, MD [facility] Pager On Amion  If 7PM-7AM, please contact night-coverage 02/27/2021, 3:29 PM

## 2021-02-28 DIAGNOSIS — E876 Hypokalemia: Secondary | ICD-10-CM | POA: Diagnosis not present

## 2021-02-28 LAB — GLUCOSE, CAPILLARY
Glucose-Capillary: 196 mg/dL — ABNORMAL HIGH (ref 70–99)
Glucose-Capillary: 236 mg/dL — ABNORMAL HIGH (ref 70–99)
Glucose-Capillary: 393 mg/dL — ABNORMAL HIGH (ref 70–99)
Glucose-Capillary: 257 mg/dL — ABNORMAL HIGH (ref 70–99)

## 2021-02-28 NOTE — Evaluation (Addendum)
Speech Language Pathology Evaluation Patient Details Name: [name] MRN: [id number] DOB: 08-Jun-1966 Today's Date: 02/28/2021 Time: 8101-7510 SLP Time Calculation (min) (ACUTE ONLY): 15 min  Problem List:  Patient Active Problem List   Diagnosis Date Noted   Psychogenic loss of appetite    Intellectual delay    Poor appetite 01/26/2021   AMS (altered mental status) 01/25/2021   Constipation 01/24/2021   Bipolar disorder (HCC) 01/24/2021   Diabetes (HCC) 01/24/2021   Observed seizure-like activity (HCC) 10/31/2020   Seizure (HCC) 10/31/2020   Hypoglycemia 10/31/2020   Elevated LFTs 10/31/2020   Acute encephalopathy 10/30/2020   Change in mental status 10/29/2020   Intellectual disability    Confusion    Past Medical History:  Past Medical History:  Diagnosis Date   Diabetes mellitus without complication (HCC)    Hypertension    Immune deficiency disorder (HCC)    Mild mental retardation    Past Surgical History:  Past Surgical History:  Procedure Laterality Date   IR GASTROSTOMY TUBE MOD SED  02/14/2021   HPI:  55yo female admitted 01/23/21 with possible seizure and increased confusion x2wks. PMH: intellectual disability, DM2, HTN, BiPolar disorder. CT Head and CXR - no acute findings. PEG placed 02/14/21   Assessment / Plan / Recommendation Clinical Impression  The Mini-Mental State Examination (MMSE) was administered. Pt scored 21/30, confirming known cognitive deficits related to intellectual disability and bipolar disorder.   Pt continues to have difficulty with orientation, attention/calculation, following 3-step commands, reading and writing, problem solving and executive functions. Pt demonstrated good immediate and delayed recall, remembering 3/3 unrelated words with delay. Speech and Language skills appear to be intact. Pt's responses are variable, with some requiring over a minute to generate. Suspect pt's deficits are at or close to baseline. ST will sign off  at this time.    SLP Assessment  SLP Recommendation/Assessment: Patient does not need any further Speech Lanaguage Pathology Services SLP Visit Diagnosis: Cognitive communication deficit (R41.841)    Follow Up Recommendations  None          SLP Evaluation Cognition  Overall Cognitive Status: History of cognitive impairments - at baseline Arousal/Alertness: Awake/alert Orientation Level: Oriented to person;Oriented to place;Disoriented to time       Comprehension  Auditory Comprehension Overall Auditory Comprehension: Appears within functional limits for tasks assessed    Expression Expression Primary Mode of Expression: Verbal Verbal Expression Overall Verbal Expression: Impaired at baseline Written Expression Dominant Hand: Left   Oral / Motor  Oral Motor/Sensory Function Overall Oral Motor/Sensory Function: Within functional limits Motor Speech Overall Motor Speech: Appears within functional limits for tasks assessed   GO                   Branon Sabine B. Murvin Natal, [facility], CCC-SLP Speech Language Pathologist Office: [phone]  [name] 02/28/2021, 12:58 PM

## 2021-02-28 NOTE — Progress Notes (Signed)
[facility] PROGRESS NOTE  LAKESHIA DOHNER VOJ:[id number] DOB: [birth year] DOA: 01/23/2021 PCP: Mirna Mires, MD  Status: Remains inpatient appropriate because:Altered mental status, Unsafe d/c plan, IV treatments appropriate due to intensity of illness or inability to take PO, and Inpatient level of care appropriate due to severity of illness  Dispo: The patient is from: Home              Anticipated d/c is to: Home: Patient will have further psychological decline if she were placed in a skilled nursing facility for physical rehabilitation purposes              Patient currently is not medically stable to d/c.Marland Kitchen  Now that she has awakened need to ensure that patient can mobilize appropriately and navigate stairs before discharge home.   Difficult to place patient Yes   Level of care: Med-Surg  Code Status: Full Family Communication: Sister [name] 9/9 DVT prophylaxis: Lovenox COVID vaccination status: Unknown   HPI: 55 y.o. F with DM, HTN, developmental delay who presented with decreased oral intake and "saying bizarre things".   In the ER, CT head unremarkable.  She was hypoglycemic and otherwise had normal chemistries and hemogram.  Admitted for evaluation of erratic behavior.  Subjective: Awake but more withdrawn than she has been for the past several days.  She does track around the room and has limited interaction with conversation.  She did admit to me that she was sad and wanted to go home.  Objective: Vitals:   02/28/21 0011 02/28/21 0431  BP: (!) 142/80 (!) 135/93  Pulse: 100 81  Resp: 18 18  Temp: 97.6 F (36.4 C) 97.6 F (36.4 C)  SpO2: 100% 97%    Intake/Output Summary (Last 24 hours) at 02/28/2021 0821 Last data filed at 02/28/2021 0436 Gross per 24 hour  Intake --  Output 350 ml  Net -350 ml   Filed Weights   02/19/21 0500 02/20/21 0432 02/22/21 0651  Weight: 73.7 kg 73.5 kg 71.6 kg    Exam: Constitutional: NAD, calm, alert Respiratory: Lungs  clear, no increased work of breathing, normal pulse oximetry readings room air. Cardiac: S1-S2, no peripheral edema, normotensive, regular pulse Abdomen: PEG tube placed 8/29. LBM 9/12 abdomen soft nontender nondistended.  Has an appetite but has very narrow food preferences so many times does not eat food on her plate.  Has preference for PB&J sandwiches Neurologic: Cranial nerves II through XII are intact.  Patient moving all extremities equally with strength 4-5/5.  Sensation intact. Psychiatric: Awake but more withdrawn than last week.  Mitts to being sad because she remains in the hospital.  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 Ativan initiated during the hospitalization with significant improvement in patient's symptoms.  Plan is to discharge patient back home with her sister and continue Ativan as needed Because of lack of improvement/extremely poor oral intake during the hospitalization PEG tube which was placed on 8/29.  PEG can be continued in 4 to 6 weeks from date of insertion  Poor oral intake 2/2 fixed delusions/Malnutrition/Obesity PEG tube placed on 8/29 -can remove in 4 to 6 weeks after discharge Eating better-have asked nursing staff to obtain patient's preferences in order that she will eat better.  Last night she ate 100% of a PB&J sandwich when it was determined that is  her favorite meal.   Seizure disorder POA Continue keppra -dose lowered this admission  Physical deconditioning secondary to fixed delusion PT/OT aware of discharge plan to home.  Focus will be on stair navigation and increasing mobility-better able to participate now that psychiatric status is stabilized In process of attempting to arrange home health  services.  Need to determine from sister how many stairs patient must navigate and whether or not patient can return to the day program while utilizing a rolling walker.  The sister works during the day and depends on the day program to manage the patient while she is at work.   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  Long-acting insulin resumed.  Likely will need additional adjustments before discharge Continue SSI  Hypokalemia Hypomagnesemia Resolved         Data Reviewed: Basic Metabolic Panel: Recent Labs  Lab 02/27/21 0820 02/27/21 1744  NA 134*  --   K 4.1  --   CL 101  --   CO2 25  --   GLUCOSE 243* 342*  BUN 6  --   CREATININE 0.59  --   CALCIUM 9.7  --   MG 2.1  --    Liver Function Tests: Recent Labs  Lab 02/27/21 0820  AST 36  ALT 48*  ALKPHOS 71  BILITOT 0.6  PROT 7.3  ALBUMIN 3.5   No results for input(s): LIPASE, AMYLASE in the last 168 hours. No results for input(s): AMMONIA in the last 168 hours. CBC: No results for input(s): WBC, NEUTROABS, HGB, HCT, MCV, PLT in the last 168 hours.   CBG: Recent Labs  Lab 02/27/21 1619 02/27/21 1622 02/27/21 1703 02/27/21 2109 02/28/21 0658  GLUCAP >600* 433* 342* 277* 196*    Scheduled Meds:  benztropine  0.5 mg Oral Daily   enoxaparin (LOVENOX) injection  40 mg Subcutaneous Q24H   feeding supplement (GLUCERNA SHAKE)  237 mL Oral TID BM   free water  100 mL Per Tube Q8H   hydrocerin   Topical BID   insulin aspart  0-15 Units Subcutaneous TID WC   insulin glargine-yfgn  15 Units Subcutaneous Daily   levETIRAcetam  1,000 mg Oral BID   multivitamin with minerals  1 tablet Oral Daily   valACYclovir  500 mg Oral QHS      Principal Problem:   Acute encephalopathy Active Problems:   Hypoglycemia   Constipation   Bipolar disorder (HCC)   Diabetes (HCC)   AMS (altered mental status)   Poor appetite   Psychogenic loss  of appetite   Intellectual delay   Consultants: Neurology Psychiatry  Procedures: EEG Long-term EEG  Antibiotics: Valacyclovir 8/15 through 8/28   Time spent: 15 minutes    Junious Silk ANP  [facility] 7 am - 330 pm/M-F for direct patient care and secure chat Please refer to Amion for contact info 34  days

## 2021-02-28 NOTE — Progress Notes (Signed)
Inpatient Diabetes Program Recommendations  AACE/ADA: New Consensus Statement on Inpatient Glycemic Control   Target Ranges:  Prepandial:   less than 140 mg/dL      Peak postprandial:   less than 180 mg/dL (1-2 hours)      Critically ill patients:  140 - 180 mg/dL   Results for [name], [name] (MRN [id number]) as of 02/28/2021 10:10  Ref. Range 02/27/2021 08:09 02/27/2021 11:31 02/27/2021 16:19 02/27/2021 16:22 02/27/2021 17:03 02/27/2021 21:09 02/28/2021 06:58  Glucose-Capillary Latest Ref Range: 70 - 99 mg/dL 223 (H) 361 (H) >224 (HH) 433 (H) 342 (H) 277 (H) 196 (H)    Review of Glycemic Control  Current orders for Inpatient glycemic control: Semglee 15 units daily, Novolog 0-15 units TID with meals  Inpatient Diabetes Program Recommendations:    Insulin: Please consider adding Novolog 0-5 units QHS for bedtime correction. Noted Semglee to 15 units daily added 02/27/21. If post prandial glucose is consistently over 180 mg/dl, may want to order Novolog 3 units TID with meals for meal coverage if patient eats at least 50% of meals.  Thanks, Orlando Penner, RN, MSN, CDE Diabetes Coordinator Inpatient Diabetes Program [phone] (Team Pager from 8am to 5pm)

## 2021-02-28 NOTE — Plan of Care (Signed)
  Problem: Education: Goal: Knowledge of General Education information will improve Description: Including pain rating scale, medication(s)/side effects and non-pharmacologic comfort measures Outcome: Progressing   Problem: Health Behavior/Discharge Planning: Goal: Ability to manage health-related needs will improve Outcome: Progressing   Problem: Clinical Measurements: Goal: Ability to maintain clinical measurements within normal limits will improve Outcome: Progressing Goal: Will remain free from infection Outcome: Progressing Goal: Diagnostic test results will improve Outcome: Progressing Goal: Respiratory complications will improve Outcome: Progressing Goal: Cardiovascular complication will be avoided Outcome: Progressing   Problem: Activity: Goal: Risk for activity intolerance will decrease Outcome: Progressing   Problem: Nutrition: Goal: Adequate nutrition will be maintained Outcome: Progressing   Problem: Coping: Goal: Level of anxiety will decrease Outcome: Progressing   Problem: Elimination: Goal: Will not experience complications related to bowel motility Outcome: Progressing Goal: Will not experience complications related to urinary retention Outcome: Progressing   Problem: Pain Managment: Goal: General experience of comfort will improve Outcome: Progressing   Problem: Safety: Goal: Ability to remain free from injury will improve Outcome: Progressing   Problem: Skin Integrity: Goal: Risk for impaired skin integrity will decrease Outcome: Progressing   Problem: Education: Goal: Expressions of having a comfortable level of knowledge regarding the disease process will increase Outcome: Progressing   Problem: Coping: Goal: Ability to adjust to condition or change in health will improve Outcome: Progressing Goal: Ability to identify appropriate support needs will improve Outcome: Progressing   Problem: Health Behavior/Discharge Planning: Goal:  Compliance with prescribed medication regimen will improve Outcome: Progressing   Problem: Clinical Measurements: Goal: Complications related to the disease process, condition or treatment will be avoided or minimized Outcome: Progressing Goal: Diagnostic test results will improve Outcome: Progressing   Problem: Self-Concept: Goal: Level of anxiety will decrease Outcome: Progressing Goal: Ability to verbalize feelings about condition will improve Outcome: Progressing   Problem: Safety: Goal: Verbalization of understanding the information provided will improve Outcome: Progressing

## 2021-02-28 NOTE — Evaluation (Signed)
Clinical/Bedside Swallow Evaluation Patient Details  Name: [name] MRN: [id number] Date of Birth: [birth year]  Today's Date: 02/28/2021 Time: SLP Start Time (ACUTE ONLY): 1200 SLP Stop Time (ACUTE ONLY): 1215 SLP Time Calculation (min) (ACUTE ONLY): 15 min  Past Medical History:  Past Medical History:  Diagnosis Date   Diabetes mellitus without complication (HCC)    Hypertension    Immune deficiency disorder (HCC)    Mild mental retardation    Past Surgical History:  Past Surgical History:  Procedure Laterality Date   IR GASTROSTOMY TUBE MOD SED  02/14/2021   HPI:  55yo female admitted 01/23/21 with possible seizure and increased confusion x2wks. PMH: intellectual disability, DM2, HTN, BiPolar disorder. CT Head and CXR - no acute findings. PEG placed 02/14/21   Assessment / Plan / Recommendation Clinical Impression  Pt continues to present with normal oropharyngeal swallow function. Pt is able to self feed, and exhibits no overt s/s aspiration. RN reported pt has a tendency to pocket food. This appears to be variable, given mentation. Will continue current diet. Recommend checking oral cavity during/after meals to assure clearing. ST will sign off.  SLP Visit Diagnosis: Dysphagia, unspecified (R13.10)    Aspiration Risk  Mild aspiration risk    Diet Recommendation Regular;Thin liquid   Liquid Administration via: Cup;Straw Medication Administration: Whole meds with liquid Supervision: Patient able to self feed;Staff to assist with self feeding Compensations: Minimize environmental distractions;Slow rate;Small sips/bites Postural Changes: Remain upright for at least 30 minutes after po intake;Seated upright at 90 degrees    Other  Recommendations Oral Care Recommendations: Oral care BID   Follow up Recommendations None        Swallow Study   General Date of Onset: 01/23/21 HPI: 55yo female admitted 01/23/21 with possible seizure and increased confusion x2wks. PMH:  intellectual disability, DM2, HTN, BiPolar disorder. CT Head and CXR - no acute findings. PEG placed 02/14/21 Type of Study: Bedside Swallow Evaluation Previous Swallow Assessment: BSE 02/18/21 Diet Prior to this Study: Regular;Thin liquids Temperature Spikes Noted: No Respiratory Status: Room air History of Recent Intubation: No Behavior/Cognition: Alert;Cooperative;Pleasant mood Oral Cavity Assessment: Within Functional Limits Oral Cavity - Dentition: Adequate natural dentition Vision: Functional for self-feeding Self-Feeding Abilities: Able to feed self;Needs assist;Needs set up Patient Positioning: Upright in chair Baseline Vocal Quality: Normal Volitional Cough: Strong Volitional Swallow: Able to elicit    Oral/Motor/Sensory Function Overall Oral Motor/Sensory Function: Within functional limits   Ice Chips Ice chips: Not tested   Thin Liquid Thin Liquid: Within functional limits Presentation: Straw;Cup    Nectar Thick Nectar Thick Liquid: Not tested   Honey Thick Honey Thick Liquid: Not tested   Puree Puree: Within functional limits Presentation: Spoon   Solid     Solid: Within functional limits Presentation: Self Fed     Ichelle Harral B. Murvin Natal, [facility], CCC-SLP Speech Language Pathologist Office: [phone]  [name] 02/28/2021,12:48 PM

## 2021-02-28 NOTE — Progress Notes (Signed)
Physical Therapy Treatment Patient Details Name: [name] MRN: [id number] DOB: March 05, 1966 Today's Date: 02/28/2021   History of Present Illness Pt is a 55 y/o female admitted for neurological evaluation of suspected encephalopathy.  However, brain imaging was normal, EEG unremarkable, UA negative, urine drug screen negative, procalcitonin low, B12, ammonia, and TSH normal.  Acute metabolic encephalopathy was ruled out. Progressive mental decline over previous 2 days so another EEG and CT head performed 8/30 with normal results, no sign of seizures. Improving mental status 02/18/21. PMH: intellectual disability, bipolar disorder, seizure disorder, type II diabetes, hypertension, hyperlipidemia, immune deficiency disorder.    PT Comments    Pt progressing towards physical therapy goals. Upon initial entry to room, pt greeted therapist, however became quiet with a blank stare afterwards. Pt did not respond to questions, however would meet therapist's eyes briefly when her name was called. In an attempt to engage pt, linens gently pulled back and cued pt to sit up as we were going to walk in the hallway. Pt responded "okay" and began transitioning to EOB. She remained quiet, but was responding to questions and was more interactive throughout the rest of the session. She continues to mobilize well with the rollator, and feel it would be beneficial for her at d/c. Will continue to follow.   Recommendations for follow up therapy are one component of a multi-disciplinary discharge planning process, led by the attending physician.  Recommendations may be updated based on patient status, additional functional criteria and insurance authorization.  Follow Up Recommendations  Home health PT;Supervision/Assistance - 24 hour     Equipment Recommendations  Other (comment) (Rollator)    Recommendations for Other Services       Precautions / Restrictions Precautions Precautions: Fall Precaution  Comments: PEG tube Restrictions Weight Bearing Restrictions: No     Mobility  Bed Mobility Overal bed mobility: Modified Independent Bed Mobility: Supine to Sit           General bed mobility comments: Increased time to initiate transition. No response to initial cues to sit up, and then suddenly said "ok" and began moving. Therapist assisted with linen management.    Transfers Overall transfer level: Needs assistance Equipment used: 4-wheeled walker Transfers: Sit to/from Stand Sit to Stand: Supervision         General transfer comment: No assist required. Light supervision provided for safety.  Ambulation/Gait Ambulation/Gait assistance: Supervision;Modified independent (Device/Increase time) Gait Distance (Feet): 300 Feet Assistive device: 4-wheeled walker;None Gait Pattern/deviations: Decreased stride length;Step-through pattern;Narrow base of support;Trunk flexed;Drifts right/left Gait velocity: Decreased Gait velocity interpretation: 1.31 - 2.62 ft/sec, indicative of limited community ambulator General Gait Details: Light supervision with bouts of modified independence with rollator for support. Close supervision provided in room when pt ambulating without the rollator. No overt LOB noted.   Stairs Stairs: Yes Stairs assistance: Min guard Stair Management: Two rails;One rail Left;No rails;Alternating pattern;Forwards Number of Stairs: 5 (x2 - 10 total) General stair comments: pt ascended/descended 5 steps of varying heights (3" and 7") in PT gym x2 trials. Initially with 2 rails, then 1 rail, and then HHA only (min guard - no assist).   Wheelchair Mobility    Modified Rankin (Stroke Patients Only)       Balance Overall balance assessment: Needs assistance Sitting-balance support: Feet supported Sitting balance-Leahy Scale: Fair   Postural control: Posterior lean Standing balance support: No upper extremity supported;During functional activity Standing  balance-Leahy Scale: Fair  Cognition Arousal/Alertness: Awake/alert Behavior During Therapy: WFL for tasks assessed/performed Overall Cognitive Status: History of cognitive impairments - at baseline Area of Impairment: Memory;Safety/judgement;Awareness;Problem solving;Following commands                     Memory: Decreased recall of precautions;Decreased short-term memory Following Commands: Follows one step commands consistently;Follows one step commands with increased time Safety/Judgement: Decreased awareness of deficits;Decreased awareness of safety Awareness: Intellectual Problem Solving: Slow processing General Comments: Increased cues to      Exercises      General Comments        Pertinent Vitals/Pain Pain Assessment: No/denies pain Faces Pain Scale: No hurt Pain Intervention(s): Monitored during session    Home Living     Available Help at Discharge: Family;Available 24 hours/day Type of Home: House              Prior Function            PT Goals (current goals can now be found in the care plan section) Acute Rehab PT Goals Patient Stated Goal: None stated. Wants to go home with [name], sister. PT Goal Formulation: Patient unable to participate in goal setting Time For Goal Achievement: 03/03/21 Potential to Achieve Goals: Good Progress towards PT goals: Progressing toward goals    Frequency    Min 3X/week      PT Plan Current plan remains appropriate    Co-evaluation              AM-PAC PT "6 Clicks" Mobility   Outcome Measure  Help needed turning from your back to your side while in a flat bed without using bedrails?: None Help needed moving from lying on your back to sitting on the side of a flat bed without using bedrails?: None Help needed moving to and from a bed to a chair (including a wheelchair)?: A Little Help needed standing up from a chair using your arms (e.g., wheelchair  or bedside chair)?: A Little Help needed to walk in hospital room?: A Little Help needed climbing 3-5 steps with a railing? : A Little 6 Click Score: 20    End of Session Equipment Utilized During Treatment: Gait belt Activity Tolerance: Patient tolerated treatment well Patient left: in chair;with call bell/phone within reach;with chair alarm set Nurse Communication: Mobility status PT Visit Diagnosis: Other abnormalities of gait and mobility (R26.89);Other symptoms and signs involving the nervous system (R29.898)     Time: 1040-1108 PT Time Calculation (min) (ACUTE ONLY): 28 min  Charges:  $Gait Training: 23-37 mins                     Conni Slipper, PT, DPT Acute Rehabilitation Services Pager: [phone] Office: [phone]    Marylynn Pearson 02/28/2021, 12:59 PM

## 2021-03-01 ENCOUNTER — Inpatient Hospital Stay (HOSPITAL_COMMUNITY): Payer: Medicaid Other

## 2021-03-01 DIAGNOSIS — G9340 Encephalopathy, unspecified: Secondary | ICD-10-CM | POA: Diagnosis not present

## 2021-03-01 DIAGNOSIS — E876 Hypokalemia: Secondary | ICD-10-CM | POA: Diagnosis not present

## 2021-03-01 LAB — GLUCOSE, CAPILLARY
Glucose-Capillary: 245 mg/dL — ABNORMAL HIGH (ref 70–99)
Glucose-Capillary: 261 mg/dL — ABNORMAL HIGH (ref 70–99)
Glucose-Capillary: 301 mg/dL — ABNORMAL HIGH (ref 70–99)
Glucose-Capillary: 90 mg/dL (ref 70–99)
Glucose-Capillary: 242 mg/dL — ABNORMAL HIGH (ref 70–99)

## 2021-03-01 MED ORDER — INSULIN ASPART 100 UNIT/ML IJ SOLN
0.0000 [IU] | Freq: Three times a day (TID) | INTRAMUSCULAR | Status: DC
  Administered 2021-03-01: 11 [IU] via SUBCUTANEOUS
  Administered 2021-03-02 (×2): 5 [IU] via SUBCUTANEOUS
  Administered 2021-03-02: 3 [IU] via SUBCUTANEOUS
  Administered 2021-03-03: 5 [IU] via SUBCUTANEOUS
  Administered 2021-03-03: 8 [IU] via SUBCUTANEOUS
  Administered 2021-03-03: 11 [IU] via SUBCUTANEOUS
  Administered 2021-03-04: 8 [IU] via SUBCUTANEOUS
  Administered 2021-03-04: 7 [IU] via SUBCUTANEOUS
  Administered 2021-03-04: 5 [IU] via SUBCUTANEOUS
  Administered 2021-03-05 (×2): 3 [IU] via SUBCUTANEOUS
  Administered 2021-03-05: 8 [IU] via SUBCUTANEOUS
  Administered 2021-03-06: 2 [IU] via SUBCUTANEOUS
  Administered 2021-03-06: 5 [IU] via SUBCUTANEOUS
  Administered 2021-03-06: 3 [IU] via SUBCUTANEOUS
  Administered 2021-03-07: 11 [IU] via SUBCUTANEOUS
  Administered 2021-03-07: 8 [IU] via SUBCUTANEOUS
  Administered 2021-03-07: 3 [IU] via SUBCUTANEOUS
  Administered 2021-03-08: 5 [IU] via SUBCUTANEOUS
  Administered 2021-03-08: 8 [IU] via SUBCUTANEOUS
  Administered 2021-03-08: 2 [IU] via SUBCUTANEOUS
  Administered 2021-03-09: 5 [IU] via SUBCUTANEOUS
  Administered 2021-03-09: 3 [IU] via SUBCUTANEOUS
  Administered 2021-03-09: 8 [IU] via SUBCUTANEOUS
  Administered 2021-03-10: 2 [IU] via SUBCUTANEOUS
  Administered 2021-03-10: 3 [IU] via SUBCUTANEOUS
  Administered 2021-03-10 – 2021-03-11 (×2): 2 [IU] via SUBCUTANEOUS
  Administered 2021-03-11: 5 [IU] via SUBCUTANEOUS
  Administered 2021-03-12: 3 [IU] via SUBCUTANEOUS
  Administered 2021-03-12: 2 [IU] via SUBCUTANEOUS
  Administered 2021-03-12: 3 [IU] via SUBCUTANEOUS
  Administered 2021-03-13 (×2): 2 [IU] via SUBCUTANEOUS
  Administered 2021-03-13 – 2021-03-14 (×4): 3 [IU] via SUBCUTANEOUS
  Administered 2021-03-15 (×2): 2 [IU] via SUBCUTANEOUS
  Administered 2021-03-15 – 2021-03-16 (×3): 3 [IU] via SUBCUTANEOUS
  Administered 2021-03-16: 5 [IU] via SUBCUTANEOUS
  Administered 2021-03-17 (×2): 2 [IU] via SUBCUTANEOUS
  Administered 2021-03-17: 3 [IU] via SUBCUTANEOUS
  Administered 2021-03-18: 2 [IU] via SUBCUTANEOUS
  Administered 2021-03-19 – 2021-03-20 (×4): 3 [IU] via SUBCUTANEOUS
  Administered 2021-03-20: 5 [IU] via SUBCUTANEOUS
  Administered 2021-03-21: 2 [IU] via SUBCUTANEOUS
  Administered 2021-03-21: 3 [IU] via SUBCUTANEOUS
  Administered 2021-03-21: 5 [IU] via SUBCUTANEOUS
  Administered 2021-03-22 (×2): 2 [IU] via SUBCUTANEOUS
  Administered 2021-03-22 – 2021-03-24 (×5): 3 [IU] via SUBCUTANEOUS

## 2021-03-01 MED ORDER — LORAZEPAM 0.5 MG PO TABS
0.5000 mg | ORAL_TABLET | Freq: Once | ORAL | Status: AC
  Administered 2021-03-01: 0.5 mg via ORAL
  Filled 2021-03-01: qty 1

## 2021-03-01 MED ORDER — INSULIN GLARGINE-YFGN 100 UNIT/ML ~~LOC~~ SOLN
18.0000 [IU] | Freq: Every day | SUBCUTANEOUS | Status: DC
  Filled 2021-03-01: qty 0.18

## 2021-03-01 MED ORDER — INSULIN ASPART 100 UNIT/ML IJ SOLN
5.0000 [IU] | Freq: Three times a day (TID) | INTRAMUSCULAR | Status: DC
  Administered 2021-03-01 – 2021-03-04 (×9): 5 [IU] via SUBCUTANEOUS

## 2021-03-01 MED ORDER — INSULIN GLARGINE-YFGN 100 UNIT/ML ~~LOC~~ SOLN
20.0000 [IU] | Freq: Every day | SUBCUTANEOUS | Status: DC
  Administered 2021-03-02: 20 [IU] via SUBCUTANEOUS
  Filled 2021-03-01: qty 0.2

## 2021-03-01 MED ORDER — INSULIN ASPART 100 UNIT/ML IJ SOLN
0.0000 [IU] | Freq: Every day | INTRAMUSCULAR | Status: DC
  Administered 2021-03-01 – 2021-03-07 (×4): 2 [IU] via SUBCUTANEOUS
  Administered 2021-03-09: 3 [IU] via SUBCUTANEOUS

## 2021-03-01 NOTE — Progress Notes (Signed)
Inpatient Diabetes Program Recommendations  AACE/ADA: New Consensus Statement on Inpatient Glycemic Control   Target Ranges:  Prepandial:   less than 140 mg/dL      Peak postprandial:   less than 180 mg/dL (1-2 hours)      Critically ill patients:  140 - 180 mg/dL  Results for LOUNA, ROTHGEB (MRN [id number]) as of 03/01/2021 10:28  Ref. Range 03/01/2021 06:26 03/01/2021 08:07  Glucose-Capillary Latest Ref Range: 70 - 99 mg/dL 366 (H)  Novolog 5 units 261 (H)   Results for MONTINE, HIGHT (MRN [id number]) as of 03/01/2021 10:28  Ref. Range 02/28/2021 06:58 02/28/2021 12:14 02/28/2021 16:35 02/28/2021 21:18  Glucose-Capillary Latest Ref Range: 70 - 99 mg/dL 035 (H)  Novolog 3 units 236 (H)  Novolog 5 units  Semglee 15 units 393 (H)  Novolog 15 units 257 (H)    Review of Glycemic Control  Current orders for Inpatient glycemic control: Semglee 18 units daily, Novolog 0-15 units TID with meals, Novolog 0-5 units QHS   Inpatient Diabetes Program Recommendations:     Insulin: Please consider increasing Semglee to 20 units daily and ordering Novolog 5 units TID with meals for meal coverage if patient eats at least 50% of meals.  Thanks, Orlando Penner, RN, MSN, CDE Diabetes Coordinator Inpatient Diabetes Program [phone] (Team Pager from 8am to 5pm)

## 2021-03-01 NOTE — Procedures (Signed)
Patient Name: [name]  MRN: [id number]  Epilepsy Attending: Charlsie Quest  Referring Physician/Provider: Junious Silk, NP Date: 03/01/2021 Duration: 23.04 mins  Patient history: 55 year old female with history of seizures and altered mental status.  EEG to evaluate for seizures.  Level of alertness: Awake, asleep  AEDs during EEG study: Keppra, Ativan  Technical aspects: This EEG study was done with scalp electrodes positioned according to the 10-20 International system of electrode placement. Electrical activity was acquired at a sampling rate of 500Hz  and reviewed with a high frequency filter of 70Hz  and a low frequency filter of 1Hz . EEG data were recorded continuously and digitally stored.   Description: The posterior dominant rhythm consists of 10 Hz activity of moderate voltage (25-35 uV) seen predominantly in posterior head regions, symmetric and reactive to eye opening and eye closing. Sleep was characterized by vertex waves, sleep spindles (12 to 14 Hz), maximal frontocentral region.  Hyperventilation and photic stimulation were not performed.     IMPRESSION: This study is within normal limits. No seizures or epileptiform discharges were seen throughout the recording.  [name] 

## 2021-03-01 NOTE — Progress Notes (Signed)
Occupational Therapy Treatment Patient Details Name: [name] MRN: [id number] DOB: [birth year] Today's Date: 03/01/2021   History of present illness Pt is a 55 y/o female admitted for neurological evaluation of suspected encephalopathy.  However, brain imaging was normal, EEG unremarkable, UA negative, urine drug screen negative, procalcitonin low, B12, ammonia, and TSH normal.  Acute metabolic encephalopathy was ruled out. Progressive mental decline over previous 2 days so another EEG and CT head performed 8/30 with normal results, no sign of seizures. Improving mental status 02/18/21. PMH: intellectual disability, bipolar disorder, seizure disorder, type II diabetes, hypertension, hyperlipidemia, immune deficiency disorder.   OT comments  Discussed with OT/R [facility] on updating goals.  Patient appeared more lethargic on this date, requiring frequent cues to follow commands.  Patient was able to ambulate to sink for grooming but perform some aspects seated due to lethargic behavior.  Patient was returned to bed following grooming tasks and nursing notified of patient's performance.  Acute OT to continue to follow.    Recommendations for follow up therapy are one component of a multi-disciplinary discharge planning process, led by the attending physician.  Recommendations may be updated based on patient status, additional functional criteria and insurance authorization.    Follow Up Recommendations  Home health OT;Supervision/Assistance - 24 hour    Equipment Recommendations  3 in 1 bedside commode;Tub/shower seat    Recommendations for Other Services      Precautions / Restrictions Precautions Precautions: Fall Precaution Comments: PEG tube       Mobility Bed Mobility Overal bed mobility: Needs Assistance Bed Mobility: Supine to Sit;Sit to Supine     Supine to sit: HOB elevated;Min assist Sit to supine: HOB elevated;Min assist   General bed mobility comments: Patient  required increased time and assistance on this date due to lethargic    Transfers Overall transfer level: Needs assistance Equipment used: Rolling walker (2 wheeled) Transfers: Sit to/from Stand Sit to Stand: Min guard Stand pivot transfers: Min guard       General transfer comment: min guard on this date due to lethargic    Balance Overall balance assessment: Needs assistance Sitting-balance support: Feet supported Sitting balance-Leahy Scale: Fair Sitting balance - Comments: patient was able to maintain balance but therapist provided min guard due to lethargic Postural control: Posterior lean Standing balance support: Bilateral upper extremity supported Standing balance-Leahy Scale: Fair Standing balance comment: performed grooming standing at sink wit min guard for assistance due to lethargic behavior                           ADL either performed or assessed with clinical judgement   ADL Overall ADL's : Needs assistance/impaired     Grooming: Wash/dry hands;Wash/dry face;Oral care;Sitting;Standing Grooming Details (indicate cue type and reason): Patient performed some aspects of grooming seated due to unsteady balance and lethargic on this date                             Functional mobility during ADLs: Minimal assistance;Moderate assistance;Rolling walker General ADL Comments: Patient required more cues on this date due to level of arousal     Vision       Perception     Praxis      Cognition Arousal/Alertness: Lethargic Behavior During Therapy: Flat affect Overall Cognitive Status: History of cognitive impairments - at baseline Area of Impairment: Orientation;Attention;Memory;Following commands;Safety/judgement;Awareness  Orientation Level: Disoriented to;Person;Place;Time Current Attention Level: Sustained Memory: Decreased recall of precautions;Decreased short-term memory Following Commands: Follows one step  commands inconsistently;Follows one step commands with increased time Safety/Judgement: Decreased awareness of deficits;Decreased awareness of safety Awareness: Intellectual Problem Solving: Slow processing General Comments: Patient more lethargic on this date requiring frequent cues to follow commands        Exercises     Shoulder Instructions       General Comments      Pertinent Vitals/ Pain       Pain Assessment: No/denies pain  Home Living                                          Prior Functioning/Environment              Frequency  Min 2X/week        Progress Toward Goals  OT Goals(current goals can now be found in the care plan section)  Progress towards OT goals: Progressing toward goals  Acute Rehab OT Goals Patient Stated Goal: None stated. Wants to go home with [name], sister. OT Goal Formulation: With patient Time For Goal Achievement: 02/25/21 Potential to Achieve Goals: Good ADL Goals Pt Will Perform Grooming: with min guard assist;sitting Pt Will Perform Upper Body Bathing: with min assist;sitting Pt Will Perform Upper Body Dressing: with min assist;sitting Additional ADL Goal #1: Patient will be Min A of one for supine to sit to increase independence with upper body ADL  Plan Discharge plan remains appropriate    Co-evaluation                 AM-PAC OT "6 Clicks" Daily Activity     Outcome Measure   Help from another person eating meals?: None Help from another person taking care of personal grooming?: A Little Help from another person toileting, which includes using toliet, bedpan, or urinal?: A Lot Help from another person bathing (including washing, rinsing, drying)?: A Little Help from another person to put on and taking off regular upper body clothing?: A Little Help from another person to put on and taking off regular lower body clothing?: A Little 6 Click Score: 18    End of Session Equipment Utilized  During Treatment: Gait belt;Rolling walker  OT Visit Diagnosis: Other symptoms and signs involving cognitive function;Muscle weakness (generalized) (M62.81)   Activity Tolerance Patient limited by lethargy   Patient Left in bed;with call bell/phone within reach;with bed alarm set   Nurse Communication Mobility status;Other (comment) (informed nursing on patient level of arousal)        Time: 1119-1140 OT Time Calculation (min): 21 min  Charges: OT General Charges $OT Visit: 1 Visit OT Treatments $Self Care/Home Management : 8-22 mins  Alfonse Flavors, OTA   Devynn Hessler Jeannett Senior 03/01/2021, 11:52 AM

## 2021-03-01 NOTE — Progress Notes (Addendum)
[facility] PROGRESS NOTE  [name] XTK:[id number] DOB: 1966-06-11 DOA: 01/23/2021 PCP: Mirna Mires, MD  Status: Remains inpatient appropriate because:Altered mental status, Unsafe d/c plan, IV treatments appropriate due to intensity of illness or inability to take PO, and Inpatient level of care appropriate due to severity of illness  Dispo: The patient is from: Home              Anticipated d/c is to: Home: Patient will have further psychological decline if she were placed in a skilled nursing facility for physical rehabilitation purposes              Patient currently is not medically stable to d/c.Marland Kitchen  Now that she has awakened need to ensure that patient can mobilize appropriately and navigate stairs before discharge home.   Difficult to place patient Yes   Level of care: Med-Surg  Code Status: Full Family Communication: Sister [name] 9/9 DVT prophylaxis: Lovenox COVID vaccination status: Unknown   HPI: 55 y.o. F with DM, HTN, developmental delay who presented with decreased oral intake and "saying bizarre things".   In the ER, CT head unremarkable.  She was hypoglycemic and otherwise had normal chemistries and hgb  Admitted for evaluation of erratic behavior.  Subjective: Drowsy and appears to be dozing while sitting up attempting to eat breakfast.  She would awaken and briefly make contact and she did say hello.  Not at baseline mentation and not as cheerful as baseline.  Objective: Vitals:   03/01/21 0018 03/01/21 0429  BP: 133/76 (!) 135/91  Pulse: 91 86  Resp: 20 18  Temp: 98.8 F (37.1 C) 98 F (36.7 C)  SpO2: 98% 97%    Intake/Output Summary (Last 24 hours) at 03/01/2021 0737 Last data filed at 03/01/2021 0429 Gross per 24 hour  Intake 236 ml  Output 1250 ml  Net -1014 ml   Filed Weights   02/19/21 0500 02/20/21 0432 02/22/21 0651  Weight: 73.7 kg 73.5 kg 71.6 kg    Exam: Constitutional: NAD, calm, more drowsy than baseline Respiratory:  Sounds remain clear to auscultation, she is stable on room air without any increased work of breathing and pulse oximetry readings remain normal Cardiac: S1-S2, pulses regular, she remains normotensive, no peripheral edema Abdomen: PEG tube placed 8/29. LBM 9/12 abdomen soft nontender nondistended.  Due to drowsiness not eating well this morning. Neurologic: Cranial nerves II through XII are intact.  Patient moving all extremities equally with strength 4-5/5.  Sensation intact. 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 Ativan initiated during the hospitalization with significant improvement in patient's symptoms.  Plan is to discharge patient back home with her sister and continue Ativan as needed Because of lack of improvement/extremely poor oral intake during the hospitalization PEG tube which was placed on 8/29.  PEG can be continued in 4 to 6 weeks from date of insertion 9/13 for the past 2-3 days patient has been less responsive and more flat with affect and limited engagement without significant encouragement and stimulation.  Possibly redeveloping catatonia symptoms so we will give Ativan 0.5 mg x 1 but level of alertness and behaviors did not improve therefore will obtain EEG to rule out breakthrough seizures  Poor oral intake 2/2 fixed delusions/Malnutrition/Obesity  PEG tube placed on 8/29 -can remove in 4 to 6 weeks after discharge Eating better-have asked nursing staff to obtain patient's preferences in order that she will eat better.  Last night she ate 100% of a PB&J sandwich when it was determined that is her favorite meal.   Seizure  disorder POA Continue keppra -dose lowered this admission  Physical deconditioning secondary to fixed delusion PT/OT aware of discharge plan to home.  Focus will be on stair navigation and increasing mobility-better able to participate now that psychiatric status is stabilized She has Medicaid so was unable to receive home health services so plan to keep patient in hospital until she can safely navigate stairs.  Sister who she lives with is moving to a new apartment and the only unit available is on the top level with at least 10 stairs to navigate   Diabetes mellitus 2 uncontrolled on long-term insulin CBGs consistently greater than 200 therefore will increase Semglee to 20 units daily and add 5 units meal coverage TID AC Prior to admission patient was on Lantus 40 units HS, metformin and Amaryl Continue SSI  Hypokalemia Hypomagnesemia Resolved         Data Reviewed: Basic Metabolic Panel: Recent Labs  Lab 02/27/21 0820 02/27/21 1744  NA 134*  --   K 4.1  --   CL 101  --   CO2 25  --   GLUCOSE 243* 342*  BUN 6  --   CREATININE 0.59  --   CALCIUM 9.7  --   MG 2.1  --    Liver Function Tests: Recent Labs  Lab 02/27/21 0820  AST 36  ALT 48*  ALKPHOS 71  BILITOT 0.6  PROT 7.3  ALBUMIN 3.5   No results for input(s): LIPASE, AMYLASE in the last 168 hours. No results for input(s): AMMONIA in the last 168 hours. CBC: No results for input(s): WBC, NEUTROABS, HGB, HCT, MCV, PLT in the last 168 hours.   CBG: Recent Labs  Lab 02/28/21 0658 02/28/21 1214 02/28/21 1635 02/28/21 2118 03/01/21 0626  GLUCAP 196* 236* 393* 257* 245*    Scheduled Meds:  benztropine  0.5 mg Oral Daily   enoxaparin (LOVENOX) injection  40 mg Subcutaneous Q24H   feeding supplement (GLUCERNA SHAKE)  237 mL Oral TID BM   free water  100 mL Per Tube Q8H   hydrocerin   Topical BID   insulin aspart  0-15 Units Subcutaneous TID WC   insulin glargine-yfgn  15 Units Subcutaneous Daily    levETIRAcetam  1,000 mg Oral BID   multivitamin with minerals  1 tablet Oral Daily   valACYclovir  500 mg Oral QHS      Principal Problem:   Acute encephalopathy Active Problems:   Hypoglycemia   Constipation   Bipolar disorder (HCC)   Diabetes (HCC)   AMS (altered mental status)   Poor appetite   Psychogenic loss of appetite   Intellectual delay   Consultants: Neurology Psychiatry  Procedures: EEG Long-term EEG  Antibiotics: Valacyclovir 8/15 through 8/28   Time spent: 15 minutes    Junious Silk ANP  [facility] 7 am - 330 pm/M-F for direct patient care and secure chat Please refer to Amion for contact info 35  days

## 2021-03-01 NOTE — Progress Notes (Signed)
Nutrition Follow-up  DOCUMENTATION CODES:  Not applicable  INTERVENTION:  -Continue Glucerna Shake po TID, each supplement provides 220 kcal and 10 grams of protein -Continue MVI with minerals daily  NUTRITION DIAGNOSIS:  Inadequate oral intake related to lethargy/confusion as evidenced by meal completion < 25%, per patient/family report. -- ongoing  GOAL:  Patient will meet greater than or equal to 90% of their needs -- progressing  MONITOR:  TF tolerance, Weight trends, Labs, I & O's  REASON FOR ASSESSMENT:  Consult Enteral/tube feeding initiation and management  ASSESSMENT:  Pt with PMH significant of intellectual disability, bipolar disorder, seizure disorder, type 2 DM, HTN, HLD, immunodeficiency disorder presented to the ED for change in mental status as reported by patient's sister for the past couple of days at home. CT head, MRI brain, EEG, and other neurologic work-up have been negative. Abdominal x-ray showed moderate stool burden. Pt does have h/o constipation and was recommended by GI to take Linzess and Dulcolax, though pt has been noncompliant. Pt has outpatient colonoscopy scheduled.  8/15 Cortrak placed (gastric tip) 8/29 PEG placed   Pt has been less responsive over the last 2-3 days with a more flat affect and limited engagement without significant encouragement and stimulation. 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 1 dose Ativan given with no observed improvement, so EEG to be obtained to r/o breakthrough seizures.   Pt unable to have Home Health Service as she has Medicaid, so pt to remain hospitalized until she is able to safely navigate stairs per NP.   PO Intake: 0-100% x last 8 recorded meals (~37% average meal intake)  Per RN, pt doing well with Glucerna TID.  If PO intake continues to decline/does not improve, consider re-initiation of TF.   Medications: SSI, novolog, semglee, mvi with minerals, free water Q4H via tube Labs  reviewed.  CBGs: 236-393 x24 hours (Diabetes Coordinator following)  UOP: x24 hours I/O: +14L since admit  Diet Order:   Diet Order             Diet Carb Modified Fluid consistency: Thin; Room service appropriate? Yes  Diet effective now                  EDUCATION NEEDS:  Not appropriate for education at this time  Skin:  Skin Assessment: Reviewed RN Assessment  Last BM:  9/12  Height:  Ht Readings from Last 1 Encounters:  10/30/20 5\' 1"  (1.549 m)   Weight:  Wt Readings from Last 1 Encounters:  02/22/21 71.6 kg   BMI:  Body mass index is 29.83 kg/m.  Estimated Nutritional Needs:  Kcal:  1800-2000 Protein:  90-100 grams Fluid:  >1.8L/d    04/24/21, MS, RD, LDN (she/her/hers) RD pager number and weekend/on-call pager number located in Amion.

## 2021-03-01 NOTE — Progress Notes (Signed)
EEG complete - results pending 

## 2021-03-02 DIAGNOSIS — E876 Hypokalemia: Secondary | ICD-10-CM | POA: Diagnosis not present

## 2021-03-02 LAB — GLUCOSE, CAPILLARY
Glucose-Capillary: 210 mg/dL — ABNORMAL HIGH (ref 70–99)
Glucose-Capillary: 185 mg/dL — ABNORMAL HIGH (ref 70–99)
Glucose-Capillary: 314 mg/dL — ABNORMAL HIGH (ref 70–99)
Glucose-Capillary: 246 mg/dL — ABNORMAL HIGH (ref 70–99)
Glucose-Capillary: 219 mg/dL — ABNORMAL HIGH (ref 70–99)

## 2021-03-02 MED ORDER — INSULIN GLARGINE-YFGN 100 UNIT/ML ~~LOC~~ SOLN
20.0000 [IU] | Freq: Every day | SUBCUTANEOUS | Status: DC
  Administered 2021-03-03 – 2021-03-04 (×2): 20 [IU] via SUBCUTANEOUS
  Filled 2021-03-02 (×2): qty 0.2

## 2021-03-02 MED ORDER — INSULIN GLARGINE-YFGN 100 UNIT/ML ~~LOC~~ SOLN: 25.0000 [IU] | Freq: Every day | SUBCUTANEOUS | Status: DC

## 2021-03-02 NOTE — Progress Notes (Addendum)
PROGRESS NOTE    [name]  QQV:[id number] DOB: [birth year] DOA: 01/23/2021 PCP: Mirna Mires, MD    Brief Narrative:  55 y.o. F with DM, HTN, developmental delay who presented with decreased oral intake and "saying bizarre things".   In the ER, CT head unremarkable.  She was hypoglycemic and otherwise had normal chemistries and hemogram.  Admitted for evaluation of erratic behavior.  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 intake PEG tube was placed 8/29 and was started on tube feeds -Mental status and p.o. intake improving, PEG tube can be removed in 1 to 2 months if mental status remains stable -TOC, LL OS following for disposition   Poor oral intake 2/2 fixed delusions/Malnutrition/Obesity PEG tube placed on 8/29 -can remove in 4 to 6 weeks after discharge -P.o. intake improving, off tube feeds   Seizure disorder POA Continue keppra -dose lowered this admission   Physical deconditioning secondary to fixed delusion PT/OT aware of discharge plan to home.   -Mobility and alertness will need to improve for safe disposition, 24-hour supervision recommended   Diabetes melitis 2 uncontrolled on long-term insulin -CBGs elevated,  restarted on Semglee insulin 20 units, got a dose this morning, will monitor  Hypokalemia Hypomagnesemia Resolved     DVT prophylaxis: Lovenox subq Code Status: Full Family Communication: Pt in room, family not at bedside  Status is: Inpatient  Remains inpatient appropriate because:Unsafe d/c plan  Dispo: The patient is from: Home              Anticipated d/c is to: Home, 24-hour supervision recommended              Patient currently is not medically stable to d/c.   Difficult to place patient Yes   Consultants:  Neurology Psychiatry  Procedures:  EEG Long-term EEG  Antimicrobials: Anti-infectives (From admission, onward)    Start     Dose/Rate Route Frequency Ordered Stop   02/23/21 2200  valACYclovir (VALTREX) tablet 500 mg        500 mg Oral Daily at bedtime 02/23/21 1621     02/15/21 2200  valACYclovir (VALTREX) tablet 500 mg  Status:  Discontinued        500 mg Per Tube Daily at bedtime 02/14/21 1145 02/23/21 1621   02/14/21 0916  vancomycin (VANCOCIN) IVPB 1000 mg/200 mL premix        over 60 Minutes Intravenous Continuous PRN 02/14/21 0916 02/14/21 0916   01/31/21 2215  valACYclovir (VALTREX) tablet 500 mg  Status:  Discontinued        500 mg Per Tube Daily at bedtime 01/31/21 2119 02/14/21 1145   01/25/21 2200  valACYclovir (VALTREX) tablet 500 mg  Status:  Discontinued        500 mg Oral Daily at bedtime 01/25/21 0930 01/31/21 2119       Subjective: -No issues  overnight, eating better and more alert per staff  Objective: Vitals:   03/02/21 0031 03/02/21 0416 03/02/21 0725 03/02/21 1153  BP: (!) 106/93 121/80 122/61 (!) 115/50  Pulse: 94 79 89 80  Resp: 16 18 16 18   Temp: 98.1 F (36.7 C) 97.6 F (36.4 C) 98.5 F (36.9 C) 97.6 F (36.4 C)  TempSrc: Axillary Oral Oral Oral  SpO2: 99% 99% 99% 98%  Weight:        Intake/Output Summary (Last 24 hours) at 03/02/2021 1236 Last data filed at 03/02/2021 0417 Gross per 24 hour  Intake --  Output 1750  ml  Net -1750 ml   Filed Weights   02/19/21 0500 02/20/21 0432 02/22/21 0651  Weight: 73.7 kg 73.5 kg 71.6 kg    Examination: Gen: Pleasant middle-aged female sitting up in bed, awake alert, oriented to self and place, cognitive deficits noted, flat affect HEENT: no JVD Lungs: Clear CVS: S1S2/RRR Abd: soft, Non tender, non distended, BS present Extremities: No edema Skin: no new rashes on exposed skin   Data Reviewed: I have personally reviewed following labs and imaging studies  CBC: No results for input(s): WBC, NEUTROABS, HGB, HCT, MCV, PLT in the last 168 hours.  Basic Metabolic Panel: Recent Labs  Lab 02/27/21 0820 02/27/21 1744  NA 134*  --   K 4.1  --   CL 101  --   CO2 25  --   GLUCOSE 243* 342*  BUN 6  --   CREATININE 0.59  --   CALCIUM 9.7  --   MG 2.1  --    GFR: Estimated Creatinine Clearance: 71 mL/min (by C-G formula based on SCr of 0.59 mg/dL). Liver Function Tests: Recent Labs  Lab 02/27/21 0820  AST 36  ALT 48*  ALKPHOS 71  BILITOT 0.6  PROT 7.3  ALBUMIN 3.5   No results for input(s): LIPASE, AMYLASE in the last 168 hours. No results for input(s): AMMONIA in the last 168 hours. Coagulation Profile: No results for input(s): INR, PROTIME in the last 168 hours. Cardiac Enzymes: No results for input(s): CKTOTAL, CKMB, CKMBINDEX, TROPONINI in the last 168 hours. BNP (last 3 results) No results for input(s): PROBNP in the last 8760 hours. HbA1C: No results for input(s): HGBA1C in the last 72 hours. CBG: Recent Labs  Lab Mar 08, 2021 1149 March 08, 2021 1701 03-08-21 2108 03/02/21 0624 03/02/21 1155  GLUCAP 301* 90 242* 210* 185*   Lipid Profile: No results for input(s): CHOL, HDL, LDLCALC, TRIG, CHOLHDL, LDLDIRECT in the last 72 hours. Thyroid Function Tests: No results for input(s): TSH, T4TOTAL, FREET4, T3FREE, THYROIDAB in the last 72 hours. Anemia Panel: No results for input(s): VITAMINB12, FOLATE, FERRITIN, TIBC, IRON, RETICCTPCT in the  last 72 hours. Sepsis Labs: No results for input(s): PROCALCITON, LATICACIDVEN in the last 168 hours.  No results found for this or any previous visit (from the past 240 hour(s)).   Radiology Studies: EEG adult  Result Date: 03-08-2021 03/03/2021, MD     Mar 08, 2021  3:12 PM Patient Name: CHELBY SALATA MRN: [name] Epilepsy Attending: [id number] Referring Physician/Provider: Charlsie Quest, NP Date: 03-08-21 Duration: 23.04 mins Patient history: 55 year old female with history of seizures and altered mental status.  EEG to evaluate for seizures. Level of alertness: Awake, asleep AEDs during EEG study: Keppra, Ativan Technical aspects: This EEG study was done with scalp electrodes positioned according to the 10-20 International system of electrode placement. Electrical activity was acquired at a sampling rate of 500Hz   and reviewed with a high frequency filter of 70Hz  and a low frequency filter of 1Hz . EEG data were recorded continuously and digitally stored. Description: The posterior dominant rhythm consists of 10 Hz activity of moderate voltage (25-35 uV) seen predominantly in posterior head regions, symmetric and reactive to eye opening and eye closing. Sleep was characterized by vertex waves, sleep spindles (12 to 14 Hz), maximal frontocentral region.  Hyperventilation and photic stimulation were not performed.   IMPRESSION: This study is within normal limits. No seizures or epileptiform discharges were seen throughout the recording. Priyanka    Scheduled Meds:  benztropine  0.5 mg Oral Daily   enoxaparin (LOVENOX) injection  40 mg Subcutaneous Q24H   feeding supplement (GLUCERNA SHAKE)  237 mL Oral TID BM   free water  100 mL Per Tube Q8H   hydrocerin   Topical BID   insulin aspart  0-15 Units Subcutaneous TID WC   insulin aspart  0-5 Units Subcutaneous QHS   insulin aspart  5 Units Subcutaneous TID WC   [START ON 03/03/2021] insulin glargine-yfgn  20 Units Subcutaneous  Daily   levETIRAcetam  1,000 mg Oral BID   multivitamin with minerals  1 tablet Oral Daily   valACYclovir  500 mg Oral QHS   Continuous Infusions:   LOS: 36 days   Annabelle Harman, MD [facility]  03/02/2021, 12:36 PM

## 2021-03-02 NOTE — Progress Notes (Signed)
Physical Therapy Treatment Patient Details Name: [name] MRN: [id number] DOB: [birth year] Today's Date: 03/02/2021   History of Present Illness Pt is a 55 y/o female admitted for neurological evaluation of suspected encephalopathy.  However, brain imaging was normal, EEG unremarkable, UA negative, urine drug screen negative, procalcitonin low, B12, ammonia, and TSH normal.  Acute metabolic encephalopathy was ruled out. Progressive mental decline over previous 2 days so another EEG and CT head performed 8/30 with normal results, no sign of seizures. Improving mental status 02/18/21. PMH: intellectual disability, bipolar disorder, seizure disorder, type II diabetes, hypertension, hyperlipidemia, immune deficiency disorder.    PT Comments    Pt received in supine, agreeable to therapy session and with good participation and tolerance for hallway gait training and stair trial. Pt performed 15 steps with handrails and Supervision/min guard at most for stairs/gait, pt needing increased time to perform due to slow processing but no loss of balance. Pt performed hallway gait trials x2 with seated break on rollator after stair trial and emphasis on safety with device (use of brakes prior to standing/sitting down) and activity pacing. VSS on RA but pt seems to have more word finding difficulty today. Pt continues to benefit from PT services to progress toward functional mobility goals.    Recommendations for follow up therapy are one component of a multi-disciplinary discharge planning process, led by the attending physician.  Recommendations may be updated based on patient status, additional functional criteria and insurance authorization.  Follow Up Recommendations  Home health PT;Supervision/Assistance - 24 hour     Equipment Recommendations  Other (comment) (rollator (4 wheeled walker) with a seat)    Recommendations for Other Services       Precautions / Restrictions Precautions Precautions:  Fall Precaution Comments: PEG tube Restrictions Weight Bearing Restrictions: No     Mobility  Bed Mobility Overal bed mobility: Needs Assistance Bed Mobility: Supine to Sit;Sit to Supine     Supine to sit: HOB elevated;Min guard;Min assist Sit to supine: HOB elevated;Min assist   General bed mobility comments: Patient required increased time and assistance on this date due to lethargy, min guard initially but needs minA for anterior seated scooting to foot flat.    Transfers Overall transfer level: Needs assistance Equipment used: 4-wheeled walker Transfers: Sit to/from Stand Sit to Stand: Min guard         General transfer comment: from EOB to rollator, needs cues for hand placment and use of rollator brakes; increased time to perform  Ambulation/Gait Ambulation/Gait assistance: Supervision;Min guard Gait Distance (Feet): 300 Feet (116ft to stairs, seated break, then 377ft in hallway) Assistive device: 4-wheeled walker Gait Pattern/deviations: Decreased stride length;Step-through pattern;Narrow base of support;Trunk flexed;Drifts right/left Gait velocity: Decreased Gait velocity interpretation: <1.31 ft/sec, indicative of household ambulator General Gait Details: Light supervision and at times min guard more due to pt fatigue and poor attention to task, pt needs some increased cues for navigation and directions and slow processing compared with previous session, but pt able to perform gait with speed changes, looking up/down and turning in hallway without LOB using rollator.   Stairs Stairs: Yes Stairs assistance: Min guard;+2 safety/equipment Stair Management: Two rails;Step to pattern;Forwards Number of Stairs: 15 General stair comments: pt ascended/descended 5 steps of varying heights (3" and 7") in PT gym x3 trials. No LOB and increased time to perform, min verbal cues for safety; second person present for safety but not needing to physically assist.     Balance  Overall balance  assessment: Needs assistance Sitting-balance support: Feet supported Sitting balance-Leahy Scale: Fair Sitting balance - Comments: Supervision for static/dynamic sitting Postural control: Posterior lean Standing balance support: Bilateral upper extremity supported Standing balance-Leahy Scale: Fair Standing balance comment: pt able to switch between rollator and stair railing without LOB, min guard at most for safety mostly Supervision when using rollator and when static standing with UE support for hygiene assist                    Cognition Arousal/Alertness: Awake/alert (varying alertness, pt with more fatigue and slower processing toward end of session) Behavior During Therapy: Flat affect Overall Cognitive Status: History of cognitive impairments - at baseline Area of Impairment: Orientation;Attention;Memory;Following commands;Safety/judgement;Awareness                 Orientation Level: Disoriented to;Place;Time;Situation Current Attention Level: Sustained Memory: Decreased recall of precautions;Decreased short-term memory Following Commands: Follows one step commands inconsistently;Follows one step commands with increased time Safety/Judgement: Decreased awareness of deficits;Decreased awareness of safety Awareness: Intellectual Problem Solving: Slow processing;Requires verbal cues General Comments: Patient more lethargic on this date requiring frequent cues to follow commands and increased processing time, pt incontinent of bowels but did not indicate this to therapist, and seemed unaware. Pt needing totalA for clean-up.      Exercises      General Comments General comments (skin integrity, edema, etc.): pt incontinent of bowels (pt had recently been given bath/cleanup by RN/NT and RN notified) and needing totalA for peri-care standing at bedside due to cognitive deficit.      Pertinent Vitals/Pain Pain Assessment: No/denies pain Pain  Intervention(s): Monitored during session;Repositioned     PT Goals (current goals can now be found in the care plan section) Acute Rehab PT Goals Patient Stated Goal: None stated, pt enjoys walking around and performing stairs wtih therapist. PT Goal Formulation: Patient unable to participate in goal setting Time For Goal Achievement: 03/10/21 Potential to Achieve Goals: Good Progress towards PT goals: Progressing toward goals    Frequency    Min 3X/week      PT Plan Current plan remains appropriate       AM-PAC PT "6 Clicks" Mobility   Outcome Measure  Help needed turning from your back to your side while in a flat bed without using bedrails?: None Help needed moving from lying on your back to sitting on the side of a flat bed without using bedrails?: A Little Help needed moving to and from a bed to a chair (including a wheelchair)?: A Little Help needed standing up from a chair using your arms (e.g., wheelchair or bedside chair)?: A Little Help needed to walk in hospital room?: A Little Help needed climbing 3-5 steps with a railing? : A Little 6 Click Score: 19    End of Session Equipment Utilized During Treatment: Gait belt Activity Tolerance: Patient tolerated treatment well Patient left: in bed;with call bell/phone within reach;with bed alarm set;Other (comment) (pt sitting up with lunch tray in front of her) Nurse Communication: Mobility status;Other (comment) (pt will probably need help cutting up her food and encouragement to take bites; she also likes PB &J if she will not eat spaghetti and cheese) PT Visit Diagnosis: Other abnormalities of gait and mobility (R26.89);Other symptoms and signs involving the nervous system (P61.950)     Time: 9326-7124 PT Time Calculation (min) (ACUTE ONLY): 34 min  Charges:  $Gait Training: 8-22 mins $Therapeutic Activity: 8-22 mins  Florina Ou., PTA Acute Rehabilitation Services Pager: [phone] Office:  [phone]    Angus Palms 03/02/2021, 3:18 PM

## 2021-03-02 NOTE — Progress Notes (Signed)
Inpatient Diabetes Program Recommendations  AACE/ADA: New Consensus Statement on Inpatient Glycemic Control   Target Ranges:  Prepandial:   less than 140 mg/dL      Peak postprandial:   less than 180 mg/dL (1-2 hours)      Critically ill patients:  140 - 180 mg/dL  Results for LEXII, WALSH (MRN [id number]) as of 03/02/2021 11:32  Ref. Range 03/02/2021 06:24 03/02/2021 8:04 03/02/2021 10:41  Glucose-Capillary Latest Ref Range: 70 - 99 mg/dL 631 (H)  Novolog 5 units   Novolog 5 units     Semglee 20 units   Results for BLONNIE, MASKE (MRN [id number]) as of 03/02/2021 11:32  Ref. Range 03/01/2021 08:07 03/01/2021 11:49 03/01/2021 17:01 03/01/2021 21:08  Glucose-Capillary Latest Ref Range: 70 - 99 mg/dL 588 (H)  Novolog 5 units 301 (H)  Novolog 16 units 90  Novolog 5 units 242 (H)  Novolog 2 units    Review of Glycemic Control  Current orders for Inpatient glycemic control: Semglee 25 units daily, Novolog 0-15 units TID with meals, Novolog 0-5 units QHS, Novolog 5 units TID with meals  Inpatient Diabetes Program Recommendations:    Insulin: No Semglee given on 03/01/21 (charted as NOT GIVEN). Patient has already received Semglee 20 units today at 10:41 am. Recommend decreasing Semglee back down to 20 units daily.  Thanks, Orlando Penner, RN, MSN, CDE Diabetes Coordinator Inpatient Diabetes Program [phone] (Team Pager from 8am to 5pm)

## 2021-03-03 LAB — GLUCOSE, CAPILLARY
Glucose-Capillary: >600 mg/dL (ref 70–99)
Glucose-Capillary: 218 mg/dL — ABNORMAL HIGH (ref 70–99)
Glucose-Capillary: 273 mg/dL — ABNORMAL HIGH (ref 70–99)
Glucose-Capillary: 303 mg/dL — ABNORMAL HIGH (ref 70–99)
Glucose-Capillary: 218 mg/dL — ABNORMAL HIGH (ref 70–99)

## 2021-03-03 LAB — CBC
HCT: 37.4 % (ref 36.0–46.0)
Hemoglobin: 11.8 g/dL — ABNORMAL LOW (ref 12.0–15.0)
MCH: 29.3 pg (ref 26.0–34.0)
MCHC: 31.6 g/dL (ref 30.0–36.0)
MCV: 92.8 fL (ref 80.0–100.0)
Platelets: 349 10*3/uL (ref 150–400)
RBC: 4.03 MIL/uL (ref 3.87–5.11)
RDW: 13.1 % (ref 11.5–15.5)
WBC: 9.9 10*3/uL (ref 4.0–10.5)
nRBC: 0 % (ref 0.0–0.2)

## 2021-03-03 LAB — BASIC METABOLIC PANEL
Anion gap: 9 (ref 5–15)
BUN: 12 mg/dL (ref 6–20)
CO2: 26 mmol/L (ref 22–32)
Calcium: 9.6 mg/dL (ref 8.9–10.3)
Chloride: 101 mmol/L (ref 98–111)
Creatinine, Ser: 0.73 mg/dL (ref 0.44–1.00)
GFR, Estimated: >60 mL/min (ref >60)
Glucose, Bld: 201 mg/dL — ABNORMAL HIGH (ref 70–99)
Potassium: 4.3 mmol/L (ref 3.5–5.1)
Sodium: 136 mmol/L (ref 135–145)

## 2021-03-03 NOTE — Progress Notes (Signed)
Inpatient Diabetes Program Recommendations  AACE/ADA: New Consensus Statement on Inpatient Glycemic Control (2015)  Target Ranges:  Prepandial:   less than 140 mg/dL      Peak postprandial:   less than 180 mg/dL (1-2 hours)      Critically ill patients:  140 - 180 mg/dL  Results for [name], [name] (MRN [id number]) as of 03/03/2021 09:17  Ref. Range 03/02/2021 06:24 03/02/2021 11:55 03/02/2021 15:35 03/02/2021 17:08 03/02/2021 21:41  Glucose-Capillary Latest Ref Range: 70 - 99 mg/dL 932 (H)  10 units Novolog  185 (H)  8 units Novolog  20 units Semglee @1041   314 (H) 246 (H)  10 units Novolog  219 (H)  2 units Novolog   Results for [name], [name] (MRN [name]) as of 03/03/2021 09:17  Ref. Range 03/03/2021 06:48  Glucose-Capillary Latest Ref Range: 70 - 99 mg/dL 03/05/2021 (H)  10 units Novolog  20 units Semglee    Home DM Meds: Amaryl 2 mg daily       Novolog 1-7 units TID per SSI       Lantus 40 units QHS       Metformin 500 mg BID   Current Orders: Semglee 20 units Daily      Novolog Moderate Correction Scale/ SSI (0-15 units) TID AC + HS      Novolog 5 units TID with meals     MD- Please consider the following in-hospital insulin adjustments:  1. Increase Semglee to 25 units Daily Since 20 unit dose already given this AM, please also order Semglee 5 units X 1 dose to be given this AM as well   2. Increase Novolog Meal Coverage to 7 units TID with meals    --Will follow patient during hospitalization--  542 RN, MSN, CDE Diabetes Coordinator Inpatient Glycemic Control Team Team Pager: [phone] (8a-5p)

## 2021-03-03 NOTE — Progress Notes (Addendum)
CSW spoke with patient's sister [name] who states the patient will have constant supervision at home by either herself or her 56 year old son. [name] states the patient utilizes [facility] for transportation to the [employer]. [name] is agreeable to an outpatient PT appointment - RN CM will obtain appointment prior to discharge. CSW informed [name] of patient's progress with PT with ambulation throughout the unit and in the stairwell. [name] is agreeable to pick patient up at 10am on Saturday. [name] requesting thorough instructions be provided prior to discharge including a review of all medication changes and follow up appointments.  CSW informed MD of information.  Edwin Dada, MSW, LCSW Transitions of Care  Clinical Social Worker II [phone]

## 2021-03-03 NOTE — TOC Progression Note (Signed)
Transition of Care [facility]) - Progression Note    Patient Details  Name: ODILIA DAMICO MRN: [id number] Date of Birth: 02-Jul-1965  [facility] CM/SW Contact  Janae Bridgeman, RN Phone Number: 03/03/2021, 9:01 AM  Clinical Narrative:    Case management spoke with the therapy department and the patient is able to ambulate steps appropriately to be able to discharge home with the patient's sister.  I called and left a message with Shiela, CM at [employer] and asked that 3:1, rollator and shower seat be delivered to the patient's room.  Orders are noted for the dme supplies.  Outpatient rehabilitation orders were placed for outpatient follow up since CM was unable to establish home health agency to provide PT/OT at the home due to staffing constraints.  CM and MSW with DTP Team will continue to follow the patient for transitions of care needs.   Expected Discharge Plan: Home/Self Care Barriers to Discharge: Continued Medical Work up, Psych Bed not available  Expected Discharge Plan and Services Expected Discharge Plan: Home/Self Care In-house Referral: Clinical Social Work, PCP / Production designer, theatre/television/film Services: CM Consult Post Acute Care Choice: Resumption of Svcs/PTA Provider Living arrangements for the past 2 months: Single Family Home                 DME Arranged:  (pending need after PT/OT evaluation)                     Social Determinants of Health (SDOH) Interventions    Readmission Risk Interventions No flowsheet data found.

## 2021-03-03 NOTE — Progress Notes (Signed)
PT Cancellation Note  Patient Details Name: [name] MRN: [id number] DOB: 1966-01-19   Cancelled Treatment:    Reason Eval/Treat Not Completed: (P) Other (comment) (pt eating dinner with family assist, pt/family to notify nursing staff if she is agreeable to ambulate later in day.) Will continue efforts per PT POC as schedule permits.   Halil Rentz M Gerrod Maule 03/03/2021, 6:04 PM

## 2021-03-03 NOTE — Progress Notes (Signed)
Pt seen and examined -No changes from my note yesterday, patient with developmental delay admitted with behavioral issues, catatonia -Overall improving, has significant cognitive deficits -TOC/LLOS team following -Possible dispo plans for this weekend  Zannie Cove, MD

## 2021-03-03 NOTE — Progress Notes (Signed)
Occupational Therapy Treatment Patient Details Name: [name] MRN: [id number] DOB: 1965/09/25 Today's Date: 03/03/2021   History of present illness Pt is a 55 y/o female admitted for neurological evaluation of suspected encephalopathy.  However, brain imaging was normal, EEG unremarkable, UA negative, urine drug screen negative, procalcitonin low, B12, ammonia, and TSH normal.  Acute metabolic encephalopathy was ruled out. Progressive mental decline over previous 2 days so another EEG and CT head performed 8/30 with normal results, no sign of seizures. Improving mental status 02/18/21. PMH: intellectual disability, bipolar disorder, seizure disorder, type II diabetes, hypertension, hyperlipidemia, immune deficiency disorder.   OT comments  Patient in bed and appeared alert.  Patient seen to address bed mobility, self care and functional mobility.  Patient often required verbal and tactile cues to progress with self care, often washing only one area. Patient making good progress with OT treatment.  Patient left in recliner and nursing notified.  Acute OT to continue to follow.    Recommendations for follow up therapy are one component of a multi-disciplinary discharge planning process, led by the attending physician.  Recommendations may be updated based on patient status, additional functional criteria and insurance authorization.    Follow Up Recommendations  Home health OT;Supervision/Assistance - 24 hour    Equipment Recommendations  3 in 1 bedside commode;Tub/shower seat    Recommendations for Other Services      Precautions / Restrictions Precautions Precautions: Fall Precaution Comments: PEG tube       Mobility Bed Mobility Overal bed mobility: Needs Assistance Bed Mobility: Supine to Sit     Supine to sit: Marissa assist;HOB elevated     General bed mobility comments: required verbal and tactile cues    Transfers Overall transfer level: Needs assistance Equipment  used: Rolling walker (2 wheeled) Transfers: Sit to/from Stand Sit to Stand: Marissa guard Stand pivot transfers: Marissa guard       General transfer comment: Patient used rw for mobility in room with vcs for use    Balance Overall balance assessment: Needs assistance Sitting-balance support: Feet supported Sitting balance-Leahy Scale: Fair     Standing balance support: During functional activity;Single extremity supported Standing balance-Leahy Scale: Fair Standing balance comment: able to stand at sink for grooming and UB bathing                           ADL either performed or assessed with clinical judgement   ADL Overall ADL's : Needs assistance/impaired     Grooming: Wash/dry hands;Wash/dry face;Oral care;Supervision/safety;Marissa guard;Cueing for sequencing;Standing Grooming Details (indicate cue type and reason): required cues to move on to next step with grooming Upper Body Bathing: Supervision/ safety;Marissa guard;Standing;Cueing for sequencing Upper Body Bathing Details (indicate cue type and reason): performed standing at sink Lower Body Bathing: Supervison/ safety;Cueing for sequencing;Sitting/lateral leans Lower Body Bathing Details (indicate cue type and reason): bathed legs and feet sitting with tactile and vcs to progress     Lower Body Dressing: Supervision/safety;Cueing for sequencing;Sitting/lateral leans Lower Body Dressing Details (indicate cue type and reason): doffed and donned footwear             Functional mobility during ADLs: Marissa guard;Rolling walker General ADL Comments: verbal cues for walker use and safety     Vision       Perception     Praxis      Cognition Arousal/Alertness: Awake/alert Behavior During Therapy: Flat affect Overall Cognitive Status: History of cognitive impairments -  at baseline Area of Impairment: Orientation;Attention;Memory;Following commands;Safety/judgement;Awareness                 Orientation  Level: Disoriented to;Place;Time;Situation Current Attention Level: Sustained Memory: Decreased recall of precautions;Decreased short-term memory Following Commands: Follows one step commands inconsistently;Follows one step commands with increased time Safety/Judgement: Decreased awareness of deficits;Decreased awareness of safety Awareness: Intellectual Problem Solving: Slow processing;Requires verbal cues;Requires tactile cues General Comments: Patient cooperative to care, perseveration noted with washing and oral care        Exercises     Shoulder Instructions       General Comments      Pertinent Vitals/ Pain       Pain Assessment: No/denies pain  Home Living                                          Prior Functioning/Environment              Frequency  Marissa 2X/week        Progress Toward Goals  OT Goals(current goals can now be found in the care plan section)  Progress towards OT goals: Progressing toward goals  Acute Rehab OT Goals Patient Stated Goal: None stated, pt enjoys walking around and performing stairs wtih therapist. OT Goal Formulation: With patient Time For Goal Achievement: 02/25/21 Potential to Achieve Goals: Good ADL Goals Pt Will Perform Grooming: with Marissa guard assist;sitting Pt Will Perform Upper Body Bathing: with Marissa assist;sitting Pt Will Perform Lower Body Bathing: with supervision;sit to/from stand Pt Will Perform Upper Body Dressing: with Marissa assist;sitting Pt Will Perform Lower Body Dressing: with supervision;sit to/from stand Pt Will Transfer to Toilet: with supervision;ambulating Pt Will Perform Toileting - Clothing Manipulation and hygiene: with supervision;sitting/lateral leans;sit to/from stand Additional ADL Goal #1: Complete grooming task while standing at sink with S  Plan Discharge plan remains appropriate    Co-evaluation                 AM-PAC OT "6 Clicks" Daily Activity     Outcome  Measure   Help from another person eating meals?: None Help from another person taking care of personal grooming?: A Little   Help from another person bathing (including washing, rinsing, drying)?: A Little Help from another person to put on and taking off regular upper body clothing?: A Little Help from another person to put on and taking off regular lower body clothing?: A Little 6 Click Score: 16    End of Session Equipment Utilized During Treatment: Gait belt;Rolling walker  OT Visit Diagnosis: Other symptoms and signs involving cognitive function;Muscle weakness (generalized) (M62.81) Pain - part of body: Arm;Shoulder;Leg;Ankle and joints of foot;Knee   Activity Tolerance Patient tolerated treatment well   Patient Left in chair;with call bell/phone within reach;with chair alarm set   Nurse Communication Mobility status        Time: 7846-9629 OT Time Calculation (Marissa): 25 Marissa  Charges: OT General Charges $OT Visit: 1 Visit OT Treatments $Self Care/Home Management : 23-37 mins  Alfonse Flavors, OTA   [name] 03/03/2021, 10:59 AM

## 2021-03-04 DIAGNOSIS — E876 Hypokalemia: Secondary | ICD-10-CM | POA: Diagnosis not present

## 2021-03-04 LAB — GLUCOSE, CAPILLARY
Glucose-Capillary: 272 mg/dL — ABNORMAL HIGH (ref 70–99)
Glucose-Capillary: 224 mg/dL — ABNORMAL HIGH (ref 70–99)
Glucose-Capillary: 264 mg/dL — ABNORMAL HIGH (ref 70–99)
Glucose-Capillary: 87 mg/dL (ref 70–99)

## 2021-03-04 MED ORDER — INSULIN GLARGINE-YFGN 100 UNIT/ML ~~LOC~~ SOLN
30.0000 [IU] | Freq: Every day | SUBCUTANEOUS | Status: DC
  Administered 2021-03-05: 30 [IU] via SUBCUTANEOUS
  Filled 2021-03-04: qty 0.3

## 2021-03-04 MED ORDER — INSULIN ASPART 100 UNIT/ML IJ SOLN
7.0000 [IU] | Freq: Three times a day (TID) | INTRAMUSCULAR | Status: DC
  Administered 2021-03-04 – 2021-03-07 (×10): 7 [IU] via SUBCUTANEOUS

## 2021-03-04 NOTE — Progress Notes (Signed)
Physical Therapy Treatment Patient Details Name: [name] MRN: [id number] DOB: 06-10-1966 Today's Date: 03/04/2021   History of Present Illness Pt is a 55 y/o female admitted for neurological evaluation of suspected encephalopathy.  However, brain imaging was normal, EEG unremarkable, UA negative, urine drug screen negative, procalcitonin low, B12, ammonia, and TSH normal.  Acute metabolic encephalopathy was ruled out. Progressive mental decline over previous 2 days so another EEG and CT head performed 8/30 with normal results, no sign of seizures. Improving mental status 02/18/21. PMH: intellectual disability, bipolar disorder, seizure disorder, type II diabetes, hypertension, hyperlipidemia, immune deficiency disorder.    PT Comments    Session limited by decreased cognition and limited participation. Pt not verbalizing this session but attempted x2. When PT arrived, pt alert but initially not responding to voice. She appeared to be attempting to feed herself, with a spoon in her L hand near a bowl. [name] was covered in dropped food. Assisted with self-feeding but unable to get R or L hand up to mouth. Pt not opening mouth to attempts at therapist feeding her. We sat EOB to finish changing gown - minimal initiation noted for mobility. Max assist provided for all aspects of bed mobility, and we were unable to progress to OOB.  Pt was returned to the bed and she began intermittently following simple commands (squeeze my hand, lift your arm), and attempting to initiate some movement although not successfully.   Called pt's sister, [name] per CSW request and discussed pt's current functional status compared to when I personally saw her on Monday. [name] reports concern for being able to care for the patient if mental status continues to wax and wane. She asked if we could look for SNF level rehab outside of her preferred area as well, as she is willing to travel to go see her. CSW and MD updated after  session. If mental status continues to wax and wane, SNF level rehab would be the most appropriate, however if pt maintains improvement consistently, feel home with HHPT follow up and family support would be reasonable. Will continue to follow and assess for most appropriate d/c disposition recommendations.   Recommendations for follow up therapy are one component of a multi-disciplinary discharge planning process, led by the attending physician.  Recommendations may be updated based on patient status, additional functional criteria and insurance authorization.  Follow Up Recommendations  Home health PT;Supervision/Assistance - 24 hour (Tentative - family wants SNF)     Equipment Recommendations  Other (comment) (rollator (4 wheeled walker) with a seat)    Recommendations for Other Services       Precautions / Restrictions Precautions Precautions: Fall Precaution Comments: PEG tube Restrictions Weight Bearing Restrictions: No     Mobility  Bed Mobility Overal bed mobility: Needs Assistance Bed Mobility: Supine to Sit     Supine to sit: Max assist;HOB elevated Sit to supine: Max assist   General bed mobility comments: Multimodal cues. Pt appeared to initiate movement when cued to sit up, but was not able to scoot her hips or elevate trunk. Max assist for all aspects of bed mobility.    Transfers                 General transfer comment: Unable to attempt this session due to cognition  Ambulation/Gait                 Stairs         General stair comments: Unable to attempt  Wheelchair Mobility    Modified Rankin (Stroke Patients Only)       Balance Overall balance assessment: Needs assistance Sitting-balance support: Feet supported Sitting balance-Leahy Scale: Zero Sitting balance - Comments: Max assist required. R lateral lean                                    Cognition Arousal/Alertness: Awake/alert Behavior During  Therapy: Flat affect Overall Cognitive Status: History of cognitive impairments - at baseline Area of Impairment: Orientation;Attention;Memory;Following commands;Safety/judgement;Awareness;Problem solving                 Orientation Level: Disoriented to;Place;Time;Situation;Person Current Attention Level: Focused Memory: Decreased recall of precautions;Decreased short-term memory Following Commands: Follows one step commands inconsistently;Follows one step commands with increased time Safety/Judgement: Decreased awareness of deficits;Decreased awareness of safety Awareness: Intellectual Problem Solving: Slow processing;Requires verbal cues;Requires tactile cues;Decreased initiation;Difficulty sequencing General Comments: Not verbalizing at all today, only making noises when attempting to talk x2.      Exercises      General Comments        Pertinent Vitals/Pain Pain Assessment: Faces Faces Pain Scale: No hurt    Home Living                      Prior Function            PT Goals (current goals can now be found in the care plan section) Acute Rehab PT Goals Patient Stated Goal: Not stated PT Goal Formulation: Patient unable to participate in goal setting Time For Goal Achievement: 03/10/21 Potential to Achieve Goals: Good Progress towards PT goals: Not progressing toward goals - comment    Frequency    Min 3X/week      PT Plan Current plan remains appropriate    Co-evaluation              AM-PAC PT "6 Clicks" Mobility   Outcome Measure  Help needed turning from your back to your side while in a flat bed without using bedrails?: A Lot Help needed moving from lying on your back to sitting on the side of a flat bed without using bedrails?: A Lot Help needed moving to and from a bed to a chair (including a wheelchair)?: Total Help needed standing up from a chair using your arms (e.g., wheelchair or bedside chair)?: Total Help needed to walk  in hospital room?: Total Help needed climbing 3-5 steps with a railing? : Total 6 Click Score: 8    End of Session Equipment Utilized During Treatment: Gait belt Activity Tolerance: Patient tolerated treatment well Patient left: in bed;with call bell/phone within reach;with bed alarm set;Other (comment) (4 bedrails up for safety as HOB significantly raised and foot of bed lowered for chair position. RN notified) Nurse Communication: Mobility status;Other (comment) (General cognitive status; positioning in bed) PT Visit Diagnosis: Other abnormalities of gait and mobility (R26.89);Other symptoms and signs involving the nervous system (R29.898)     Time: 9323-5573 PT Time Calculation (min) (ACUTE ONLY): 25 min  Charges:  $Therapeutic Activity: 23-37 mins                     Conni Slipper, PT, DPT Acute Rehabilitation Services Pager: [phone] Office: [phone]    Marylynn Pearson 03/04/2021, 5:44 PM

## 2021-03-04 NOTE — Progress Notes (Signed)
Nutrition Follow-up  DOCUMENTATION CODES:  Not applicable  INTERVENTION:  -Continue Glucerna Shake po TID, each supplement provides 220 kcal and 10 grams of protein -Continue MVI with minerals daily  NUTRITION DIAGNOSIS:  Inadequate oral intake related to lethargy/confusion as evidenced by meal completion < 25%, per patient/family report. -- ongoing  GOAL:  Patient will meet greater than or equal to 90% of their needs -- progressing  MONITOR:  TF tolerance, Weight trends, Labs, I & O's  REASON FOR ASSESSMENT:  Consult Enteral/tube feeding initiation and management  ASSESSMENT:  Pt with PMH significant of intellectual disability, bipolar disorder, seizure disorder, type 2 DM, HTN, HLD, immunodeficiency disorder presented to the ED for change in mental status as reported by patient's sister for the past couple of days at home. CT head, MRI brain, EEG, and other neurologic work-up have been negative. Abdominal x-ray showed moderate stool burden. Pt does have h/o constipation and was recommended by GI to take Linzess and Dulcolax, though pt has been noncompliant. Pt has outpatient colonoscopy scheduled.  8/15 Cortrak placed (gastric tip) 8/29 PEG placed   Per MD, EEG unremarkable. Pt's levels of alertness continues to fluctuate and disposition remains unclear. Pt with flat affect today and provided no responses to RD questions. Fortunately, PO intake has improved again since last RD assessment with 100% completion of meals and good supplement consumption per RN. Recommend continue current nutrition plan of care. Per MD, PEG tube can be removed 4-6 weeks after discharge if oral intake remains stable/consistent.   PO Intake: 100% x 5 recorded meals since previous RD assessment  Medications: Glucerna po TID, SSI, novolog, semglee, mvi with minerals, free water Q8H via tube Labs reviewed.  CBGs: 218-303 x24 hours (Diabetes Coordinator following)  No UOP documented x 24 hours  Diet  Order:   Diet Order             Diet Carb Modified Fluid consistency: Thin; Room service appropriate? Yes  Diet effective now                  EDUCATION NEEDS:  Not appropriate for education at this time  Skin:  Skin Assessment: Reviewed RN Assessment  Last BM:  9/14  Height:  Ht Readings from Last 1 Encounters:  10/30/20 5\' 1"  (1.549 m)   Weight:  Wt Readings from Last 1 Encounters:  02/22/21 71.6 kg   BMI:  Body mass index is 29.83 kg/m.  Estimated Nutritional Needs:  Kcal:  1800-2000 Protein:  90-100 grams Fluid:  >1.8L/d    04/24/21, MS, RD, LDN (she/her/hers) RD pager number and weekend/on-call pager number located in Amion.

## 2021-03-04 NOTE — Progress Notes (Addendum)
PROGRESS NOTE    [name]  WUJ:[id number] DOB: [birth year] DOA: 01/23/2021 PCP: Mirna Mires, MD    Brief Narrative:  55 y.o. F with DM, HTN, developmental delay who presented with decreased oral intake and "saying bizarre things".   In the ER, CT head unremarkable.  She was hypoglycemic, rest of her work-up was unremarkable.  Admitted for evaluation of erratic behavior.  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 intake PEG tube was placed 8/29 and was started on tube feeds -Mental status still fluctuating, flat and not interactive today -TOC, LLOS following for disposition   Poor oral intake 2/2 fixed delusions/Malnutrition/Obesity PEG tube placed on 8/29 -can remove in 4 to 6 weeks after discharge if oral intake remains stable and consistent -P.o. intake improving, off tube feeds   Seizure disorder POA Continue keppra -dose lowered this admission   Physical deconditioning secondary to fixed delusion -PT OT following -Level of alertness continues to fluctuate, mobility reportedly improving -Dispo remains unclear, 24-hour supervision recommended   Diabetes melitis 2 uncontrolled on long-term insulin -CBGs elevated, restarted on Semglee insulin, dose  increased  Hypokalemia Hypomagnesemia Resolved     DVT prophylaxis: Lovenox subq Code Status: Full Family Communication: Pt in room, no family at bedside  Status is: Inpatient  Remains inpatient appropriate because:Unsafe d/c plan  Dispo: The patient is from: Home              Anticipated d/c is to: Home, 24-hour supervision recommended              Patient currently is not medically stable to d/c.   Difficult to place patient Yes   Consultants:  Neurology Psychiatry  Procedures:  EEG Long-term EEG  Antimicrobials: Anti-infectives (From admission, onward)    Start     Dose/Rate Route Frequency Ordered Stop   02/23/21 2200  valACYclovir (VALTREX) tablet 500 mg        500 mg Oral Daily at bedtime 02/23/21 1621     02/15/21 2200  valACYclovir (VALTREX) tablet 500 mg  Status:  Discontinued        500 mg Per Tube Daily at bedtime 02/14/21 1145 02/23/21 1621   02/14/21 0916  vancomycin (VANCOCIN) IVPB 1000 mg/200 mL premix        over 60 Minutes Intravenous Continuous PRN 02/14/21 0916 02/14/21 0916   01/31/21 2215  valACYclovir (VALTREX) tablet 500 mg  Status:  Discontinued        500 mg Per Tube Daily at bedtime 01/31/21 2119 02/14/21 1145   01/25/21 2200  valACYclovir (VALTREX) tablet 500 mg  Status:  Discontinued        500 mg Oral Daily at bedtime 01/25/21 0930 01/31/21 2119       Subjective: -More flat today, less interactive, did not answer any questions  Objective: Vitals:   03/03/21 0358 03/03/21 2000 03/03/21 2341 03/04/21 0427  BP: 130/81 Marland Kitchen)  154/77 (!) 141/87 (!) 163/81  Pulse: 88 (!) 101 98 91  Resp: 17 16 17 17   Temp: 97.6 F (36.4 C) 98 F (36.7 C) 99.2 F (37.3 C) 98.3 F (36.8 C)  TempSrc: Oral  Oral Oral  SpO2: 100% 98% 97% 98%  Weight:       No intake or output data in the 24 hours ending 03/04/21 1040  Filed Weights   02/19/21 0500 02/20/21 0432 02/22/21 0651  Weight: 73.7 kg 73.5 kg 71.6 kg    Examination: Gen: Middle-aged female  sitting up in bed, awake, flat affect, sitting with food in her mouth, pocketing without chewing or swallowing less alert today HEENT: No JVD CVS: S1-S2, regular rate rhythm Lungs: Clear bilaterally Abdomen: Soft, nontender, bowel sounds present Extremities: No edema  Skin: no new rashes on exposed skin   Data Reviewed: I have personally reviewed following labs and imaging studies  CBC: Recent Labs  Lab 03/03/21 0326  WBC 9.9  HGB 11.8*  HCT 37.4  MCV 92.8  PLT 349    Basic Metabolic Panel: Recent Labs  Lab 02/27/21 0820 02/27/21 1744 03/03/21 0326  NA 134*  --  136  K 4.1  --  4.3  CL 101  --  101  CO2 25  --  26  GLUCOSE 243* 342* 201*  BUN 6  --  12  CREATININE 0.59  --  0.73  CALCIUM 9.7  --  9.6  MG 2.1  --   --    GFR: Estimated Creatinine Clearance: 71 mL/min (by C-G formula based on SCr of 0.73 mg/dL). Liver Function Tests: Recent Labs  Lab 02/27/21 0820  AST 36  ALT 48*  ALKPHOS 71  BILITOT 0.6  PROT 7.3  ALBUMIN 3.5   No results for input(s): LIPASE, AMYLASE in the last 168 hours. No results for input(s): AMMONIA in the last 168 hours. Coagulation Profile: No results for input(s): INR, PROTIME in the last 168 hours. Cardiac Enzymes: No results for input(s): CKTOTAL, CKMB, CKMBINDEX, TROPONINI in the last 168 hours. BNP (last 3 results) No results for input(s): PROBNP in the last 8760 hours. HbA1C: No results for input(s): HGBA1C in the last 72 hours. CBG: Recent Labs  Lab 03/03/21 0648 03/03/21 1157 03/03/21 1625 03/03/21 2142 03/04/21 0629  GLUCAP 218* 273* 303* 218* 272*   Lipid Profile: No results for input(s): CHOL, HDL, LDLCALC, TRIG, CHOLHDL, LDLDIRECT in the last 72 hours. Thyroid Function Tests: No results for input(s): TSH, T4TOTAL, FREET4, T3FREE, THYROIDAB in the last 72 hours. Anemia Panel: No results for input(s): VITAMINB12, FOLATE, FERRITIN, TIBC, IRON, RETICCTPCT in the last 72 hours. Sepsis Labs: No results for  input(s): PROCALCITON, LATICACIDVEN in the last 168 hours.  No results found for this or any previous visit (from the past 240 hour(s)).   Radiology Studies: No results found.  Scheduled Meds:  benztropine  0.5 mg Oral Daily   enoxaparin (LOVENOX) injection  40 mg Subcutaneous Q24H   feeding supplement (GLUCERNA SHAKE)  237 mL Oral TID BM   free water  100 mL Per Tube Q8H   hydrocerin   Topical BID   insulin aspart  0-15 Units Subcutaneous TID WC   insulin aspart  0-5 Units Subcutaneous QHS   insulin aspart  7 Units Subcutaneous TID WC   [START ON 03/05/2021] insulin glargine-yfgn  30 Units Subcutaneous Daily   levETIRAcetam  1,000 mg Oral BID   multivitamin with minerals  1 tablet Oral Daily  valACYclovir  500 mg Oral QHS   Continuous Infusions:   LOS: 38 days   Zannie Cove, MD [facility]  03/04/2021, 10:40 AM

## 2021-03-04 NOTE — Progress Notes (Signed)
Inpatient Diabetes Program Recommendations  AACE/ADA: New Consensus Statement on Inpatient Glycemic Control (2015)  Target Ranges:  Prepandial:   less than 140 mg/dL      Peak postprandial:   less than 180 mg/dL (1-2 hours)      Critically ill patients:  140 - 180 mg/dL   Lab Results  Component Value Date   GLUCAP 272 (H) 03/04/2021   HGBA1C 6.4 (H) 01/23/2021    Review of Glycemic Control Results for [name], [name] (MRN [id number]) as of 03/04/2021 09:33  Ref. Range 03/03/2021 16:25 03/03/2021 21:42 03/04/2021 06:29  Glucose-Capillary Latest Ref Range: 70 - 99 mg/dL 144 (H) 818 (H) 563 (H)   Home DM Meds: Amaryl 2 mg daily                             Novolog 1-7 units TID per SSI                             Lantus 40 units QHS                             Metformin 500 mg BID     Current Orders: Semglee 20 units Daily                            Novolog Moderate Correction Scale/ SSI (0-15 units) TID AC + HS                            Novolog 5 units TID with meals       Consider the following in-hospital insulin adjustments:   1. Increase Semglee to 30 units Daily Since 20 unit dose already given this AM, please also order Semglee 5 units X 1 dose to be given this AM as well     2. Increase Novolog Meal Coverage to 7 units TID with meals  Thanks, Lujean Rave, MSN, RNC-OB Diabetes Coordinator [phone] (8a-5p)

## 2021-03-05 DIAGNOSIS — E876 Hypokalemia: Secondary | ICD-10-CM | POA: Diagnosis not present

## 2021-03-05 LAB — CBC
HCT: 39.8 % (ref 36.0–46.0)
Hemoglobin: 12.6 g/dL (ref 12.0–15.0)
MCH: 29 pg (ref 26.0–34.0)
MCHC: 31.7 g/dL (ref 30.0–36.0)
MCV: 91.7 fL (ref 80.0–100.0)
Platelets: 361 10*3/uL (ref 150–400)
RBC: 4.34 MIL/uL (ref 3.87–5.11)
RDW: 13.1 % (ref 11.5–15.5)
WBC: 10 10*3/uL (ref 4.0–10.5)
nRBC: 0 % (ref 0.0–0.2)

## 2021-03-05 LAB — BASIC METABOLIC PANEL
Anion gap: 8 (ref 5–15)
BUN: 9 mg/dL (ref 6–20)
CO2: 26 mmol/L (ref 22–32)
Calcium: 9.7 mg/dL (ref 8.9–10.3)
Chloride: 102 mmol/L (ref 98–111)
Creatinine, Ser: 0.7 mg/dL (ref 0.44–1.00)
GFR, Estimated: >60 mL/min (ref >60)
Glucose, Bld: 108 mg/dL — ABNORMAL HIGH (ref 70–99)
Potassium: 4 mmol/L (ref 3.5–5.1)
Sodium: 136 mmol/L (ref 135–145)

## 2021-03-05 LAB — GLUCOSE, CAPILLARY
Glucose-Capillary: 225 mg/dL — ABNORMAL HIGH (ref 70–99)
Glucose-Capillary: 183 mg/dL — ABNORMAL HIGH (ref 70–99)
Glucose-Capillary: 160 mg/dL — ABNORMAL HIGH (ref 70–99)
Glucose-Capillary: 271 mg/dL — ABNORMAL HIGH (ref 70–99)
Glucose-Capillary: 148 mg/dL — ABNORMAL HIGH (ref 70–99)

## 2021-03-05 MED ORDER — INSULIN GLARGINE-YFGN 100 UNIT/ML ~~LOC~~ SOLN
25.0000 [IU] | Freq: Every day | SUBCUTANEOUS | Status: DC
  Administered 2021-03-06 – 2021-03-07 (×2): 25 [IU] via SUBCUTANEOUS
  Filled 2021-03-05 (×3): qty 0.25

## 2021-03-05 NOTE — Progress Notes (Addendum)
CSW spoke with patient's sister [name] to discuss SNF placement - CSW explained the financial requirement if a SNF bed is located and [name] is agreeable to forfeit the patient's income for rehab purposes. [name] agreeable for CSW to explore facilities outside of [facility].  CSW faxed patient's clinicals out for review to obtain potential bed offers. CSW submitted clinical information to [facility] to obtain a PASSR number.  Edwin Dada, MSW, LCSW Transitions of Care  Clinical Social Worker II [phone]

## 2021-03-05 NOTE — Plan of Care (Signed)
  Problem: Education: Goal: Knowledge of General Education information will improve Description: Including pain rating scale, medication(s)/side effects and non-pharmacologic comfort measures Outcome: Progressing   Problem: Health Behavior/Discharge Planning: Goal: Ability to manage health-related needs will improve Outcome: Progressing   Problem: Clinical Measurements: Goal: Ability to maintain clinical measurements within normal limits will improve Outcome: Progressing Goal: Will remain free from infection Outcome: Progressing   Problem: Nutrition: Goal: Adequate nutrition will be maintained Outcome: Progressing   Problem: Education: Goal: Expressions of having a comfortable level of knowledge regarding the disease process will increase Outcome: Progressing   Problem: Coping: Goal: Ability to adjust to condition or change in health will improve Outcome: Progressing Goal: Ability to identify appropriate support needs will improve Outcome: Progressing   Problem: Health Behavior/Discharge Planning: Goal: Compliance with prescribed medication regimen will improve Outcome: Progressing   Problem: Medication: Goal: Risk for medication side effects will decrease Outcome: Progressing

## 2021-03-05 NOTE — Progress Notes (Addendum)
PROGRESS NOTE    [name]  WUJ:[id number] DOB: June 17, 1966 DOA: 01/23/2021 PCP: Mirna Mires, MD    Brief Narrative:  55 y.o. F with DM, HTN, developmental delay who presented with decreased oral intake and "saying bizarre things".   In the ER, CT head unremarkable.  She was hypoglycemic, rest of her work-up was unremarkable.  Admitted for evaluation of erratic behavior.  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 intake PEG tube was placed 8/29 and was started on tube feeds -Mental status still fluctuating, poorly interactive today, will attempt dose of p.o. Ativan this morning, if no improvement may need psych reevaluation in the next day or so   Poor oral intake 2/2 fixed delusions/Malnutrition/Obesity PEG tube placed on 8/29 -can remove in 4 to 6 weeks after discharge if oral intake remains stable and consistent -P.o. 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   Difficult to place patient Yes   Consultants:  Neurology Psychiatry  Procedures:  EEG Long-term EEG  Antimicrobials: Anti-infectives (From admission, onward)    Start     Dose/Rate Route Frequency Ordered Stop   02/23/21 2200  valACYclovir (VALTREX) tablet 500 mg        500 mg Oral Daily at bedtime 02/23/21 1621     02/15/21 2200  valACYclovir (VALTREX) tablet 500 mg  Status:  Discontinued        500 mg Per Tube Daily at bedtime 02/14/21 1145 02/23/21 1621   02/14/21 0916  vancomycin (VANCOCIN) IVPB 1000 mg/200 mL premix        over 60 Minutes Intravenous Continuous PRN 02/14/21 0916 02/14/21 0916   01/31/21 2215  valACYclovir (VALTREX) tablet 500 mg  Status:  Discontinued        500 mg Per Tube Daily at bedtime 01/31/21 2119 02/14/21 1145   01/25/21 2200  valACYclovir (VALTREX) tablet 500 mg  Status:  Discontinued        500 mg Oral Daily at bedtime 01/25/21 0930 01/31/21 2119       Subjective: -Less interactive, refusing food  Objective: Vitals:   03/04/21 2014 03/05/21 0059  03/05/21 0413 03/05/21 0825  BP: (!) 146/73 (!) 142/57 132/64 124/70  Pulse: 97 85 77 86  Resp: 18 10 18 16   Temp: 98.7 F (37.1 C) 97.6 F (36.4 C) 97.7 F (36.5 C) 98.6 F (37 C)  TempSrc: Oral Oral Oral Oral  SpO2: 100% 100% 100% 98%  Weight:        Intake/Output Summary (Last 24 hours) at 03/05/2021 1148 Last data filed at 03/05/2021 0953 Gross per 24 hour  Intake 240 ml  Output 1050 ml  Net -810 ml    Filed Weights    02/19/21 0500 02/20/21 0432 02/22/21 0651  Weight: 73.7 kg 73.5 kg 71.6 kg    Examination: Gen: Middle-aged female, sitting up in bed, awake, flat affect, less interactive today HEENT: No JVD CVS: S1-S2, regular rate rhythm lungs: Clear bilaterally Abdomen: Soft, nontender, bowel sounds present Extremities: No edema Neuro: Moves all extremities, increased muscle tone in upper extremities  Skin: no new rashes on exposed skin   Data Reviewed: I have personally reviewed following labs and imaging studies  CBC: Recent Labs  Lab 03/03/21 0326 03/05/21 0153  WBC 9.9 10.0  HGB 11.8* 12.6  HCT 37.4 39.8  MCV 92.8 91.7  PLT 349 361    Basic Metabolic Panel: Recent Labs  Lab 02/27/21 0820 02/27/21 1744 03/03/21 0326 03/05/21 0153  NA 134*  --  136 136  K 4.1  --  4.3 4.0  CL 101  --  101 102  CO2 25  --  26 26  GLUCOSE 243* 342* 201* 108*  BUN 6  --  12 9  CREATININE 0.59  --  0.73 0.70  CALCIUM 9.7  --  9.6 9.7  MG 2.1  --   --   --    GFR: Estimated Creatinine Clearance: 71 mL/min (by C-G formula based on SCr of 0.7 mg/dL). Liver Function Tests: Recent Labs  Lab 02/27/21 0820  AST 36  ALT 48*  ALKPHOS 71  BILITOT 0.6  PROT 7.3  ALBUMIN 3.5   No results for input(s): LIPASE, AMYLASE in the last 168 hours. No results for input(s): AMMONIA in the last 168 hours. Coagulation Profile: No results for input(s): INR, PROTIME in the last 168 hours. Cardiac Enzymes: No results for input(s): CKTOTAL, CKMB, CKMBINDEX, TROPONINI in the last 168 hours. BNP (last 3 results) No results for input(s): PROBNP in the last 8760 hours. HbA1C: No results for input(s): HGBA1C in the last 72 hours. CBG: Recent Labs  Lab 03/04/21 1238 03/04/21 1800 03/04/21 2142 03/05/21 0628 03/05/21 0632  GLUCAP 224* 264* 87 225* 183*   Lipid Profile: No results for input(s): CHOL, HDL, LDLCALC, TRIG, CHOLHDL, LDLDIRECT in the last 72 hours. Thyroid Function Tests: No results for  input(s): TSH, T4TOTAL, FREET4, T3FREE, THYROIDAB in the last 72 hours. Anemia Panel: No results for input(s): VITAMINB12, FOLATE, FERRITIN, TIBC, IRON, RETICCTPCT in the last 72 hours. Sepsis Labs: No results for input(s): PROCALCITON, LATICACIDVEN in the last 168 hours.  No results found for this or any previous visit (from the past 240 hour(s)).   Radiology Studies: No results found.  Scheduled Meds:  benztropine  0.5 mg Oral Daily   enoxaparin (LOVENOX) injection  40 mg Subcutaneous Q24H   feeding supplement (GLUCERNA SHAKE)  237 mL Oral TID BM   free water  100 mL Per Tube Q8H   hydrocerin   Topical BID   insulin aspart  0-15 Units Subcutaneous TID WC   insulin aspart  0-5 Units Subcutaneous QHS   insulin aspart  7 Units Subcutaneous TID WC   insulin glargine-yfgn  30 Units Subcutaneous Daily   levETIRAcetam  1,000 mg Oral BID   multivitamin with minerals  1 tablet Oral Daily   valACYclovir  500 mg Oral QHS   Continuous Infusions:   LOS: 39 days   Zannie Cove, MD [facility]  03/05/2021, 11:48 AM

## 2021-03-05 NOTE — Progress Notes (Signed)
This patient will require 30 days or less of rehab at a Skilled Nursing Facility.  Edwin Dada, MSW, LCSW Transitions of Care  Clinical Social Worker II [phone]

## 2021-03-06 DIAGNOSIS — E876 Hypokalemia: Secondary | ICD-10-CM | POA: Diagnosis not present

## 2021-03-06 LAB — CBC
HCT: 40.2 % (ref 36.0–46.0)
Hemoglobin: 12.9 g/dL (ref 12.0–15.0)
MCH: 29.3 pg (ref 26.0–34.0)
MCHC: 32.1 g/dL (ref 30.0–36.0)
MCV: 91.4 fL (ref 80.0–100.0)
Platelets: 352 10*3/uL (ref 150–400)
RBC: 4.4 MIL/uL (ref 3.87–5.11)
RDW: 13.2 % (ref 11.5–15.5)
WBC: 9.8 10*3/uL (ref 4.0–10.5)
nRBC: 0 % (ref 0.0–0.2)

## 2021-03-06 LAB — COMPREHENSIVE METABOLIC PANEL
ALT: 48 U/L — ABNORMAL HIGH (ref 0–44)
AST: 44 U/L — ABNORMAL HIGH (ref 15–41)
Albumin: 3.1 g/dL — ABNORMAL LOW (ref 3.5–5.0)
Alkaline Phosphatase: 73 U/L (ref 38–126)
Anion gap: 9 (ref 5–15)
BUN: 10 mg/dL (ref 6–20)
CO2: 26 mmol/L (ref 22–32)
Calcium: 9.5 mg/dL (ref 8.9–10.3)
Chloride: 101 mmol/L (ref 98–111)
Creatinine, Ser: 0.63 mg/dL (ref 0.44–1.00)
GFR, Estimated: >60 mL/min (ref >60)
Glucose, Bld: 128 mg/dL — ABNORMAL HIGH (ref 70–99)
Potassium: 4.3 mmol/L (ref 3.5–5.1)
Sodium: 136 mmol/L (ref 135–145)
Total Bilirubin: 0.4 mg/dL (ref 0.3–1.2)
Total Protein: 6.7 g/dL (ref 6.5–8.1)

## 2021-03-06 LAB — GLUCOSE, CAPILLARY
Glucose-Capillary: 129 mg/dL — ABNORMAL HIGH (ref 70–99)
Glucose-Capillary: 186 mg/dL — ABNORMAL HIGH (ref 70–99)
Glucose-Capillary: 226 mg/dL — ABNORMAL HIGH (ref 70–99)
Glucose-Capillary: 81 mg/dL (ref 70–99)

## 2021-03-06 NOTE — Progress Notes (Signed)
PROGRESS NOTE    [name]  POE:[id number] DOB: 10-12-1965 DOA: 01/23/2021 PCP: Mirna Mires, MD    Brief Narrative:  55 y.o. F with DM, HTN, developmental delay who presented with decreased oral intake and "saying bizarre things".   In the ER, CT head unremarkable.  She was hypoglycemic, rest of her work-up was unremarkable.  Admitted for evaluation of erratic behavior.  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 intake PEG tube was placed 8/29 and was started on tube feeds -Noted to have considerable fluctuation in mental status this weekend, was extremely flat, less responsive/interactive, unable to eat, seem to do better after getting Ativan yesterday, continue to monitor -TOC following   Poor oral intake 2/2 fixed delusions/Malnutrition/Obesity PEG tube placed on 8/29 -can remove in 4 to 6 weeks after discharge if oral intake remains stable and consistent -P.o. intake had been improving, off tube feeds   Seizure disorder POA Continue keppra -dose lowered this admission   Physical deconditioning secondary to fixed delusion -PT OT following -Level of alertness continues to fluctuate, mobility reportedly improving -Dispo remains unclear, 24-hour supervision recommended   Diabetes  melitis 2 uncontrolled on long-term insulin -CBGs elevated, restarted on Semglee insulin, dose increased  Hypokalemia Hypomagnesemia Resolved     DVT prophylaxis: Lovenox subq Code Status: Full Family Communication: Pt in room, no family at bedside  Status is: Inpatient  Remains inpatient appropriate because:Unsafe d/c plan  Dispo: The patient is from: Home              Anticipated d/c is to: SNF, anticipate need for long-term care              Patient currently is not medically stable to d/c.   Difficult to place patient Yes   Consultants:  Neurology Psychiatry  Procedures:  EEG Long-term EEG  Antimicrobials: Anti-infectives (From admission, onward)    Start     Dose/Rate Route Frequency Ordered Stop   02/23/21 2200  valACYclovir (VALTREX) tablet 500 mg        500 mg Oral Daily at bedtime 02/23/21 1621     02/15/21 2200  valACYclovir (VALTREX) tablet 500 mg  Status:  Discontinued        500 mg Per Tube Daily at bedtime 02/14/21 1145 02/23/21 1621   02/14/21 0916  vancomycin (VANCOCIN) IVPB 1000 mg/200 mL premix        over 60 Minutes Intravenous Continuous PRN 02/14/21 0916 02/14/21 0916   01/31/21 2215  valACYclovir (VALTREX) tablet 500 mg  Status:  Discontinued        500 mg Per Tube Daily at bedtime 01/31/21 2119 02/14/21 1145   01/25/21 2200  valACYclovir (VALTREX) tablet 500 mg  Status:  Discontinued        500 mg Oral Daily at bedtime 01/25/21 0930 01/31/21 2119       Subjective: -More awake and interactive today, was able to feed  herself with some assistance  Objective: Vitals:   03/05/21 2049 03/05/21 2341 03/06/21 0355 03/06/21 0857  BP: (!) 136/91 129/88 115/75 126/78  Pulse: 96 95 95 95  Resp: 18 16 20 18   Temp: 98.2 F (36.8 C) 97.7 F (36.5 C) 98 F (36.7 C) 97.7 F (36.5 C)  TempSrc: Oral Oral Oral Oral  SpO2: 100% 97% 100% 99%  Weight:        Intake/Output Summary (Last 24 hours) at 03/06/2021 1107 Last data filed at 03/05/2021  1733 Gross per 24 hour  Intake 500 ml  Output 1800 ml  Net -1300 ml    Filed Weights   02/19/21 0500 02/20/21 0432 02/22/21 0651  Weight: 73.7 kg 73.5 kg 71.6 kg    Examination: Gen: Middle-aged female sitting up in bed, awake, more alert and interactive today, mumbles yes CVs: S1S2/RRR Lungs: CTAB Abdomen: Soft, nontender pulses present  Extremities: No edema  Neuro: Moves all extremities, increased muscle tone in upper extremities  Skin: no new rashes on exposed skin   Data Reviewed: I have personally reviewed following labs and imaging studies  CBC: Recent Labs  Lab 03/03/21 0326 03/05/21 0153 03/06/21 0224  WBC 9.9 10.0 9.8  HGB 11.8* 12.6 12.9  HCT 37.4 39.8 40.2  MCV 92.8 91.7 91.4  PLT 349 361 352    Basic Metabolic Panel: Recent Labs  Lab 02/27/21 1744 03/03/21 0326 03/05/21 0153 03/06/21 0224  NA  --  136 136 136  K  --  4.3 4.0 4.3  CL  --  101 102 101  CO2  --  26 26 26   GLUCOSE 342* 201* 108* 128*  BUN  --  12 9 10   CREATININE  --  0.73 0.70 0.63  CALCIUM  --  9.6 9.7 9.5   GFR: Estimated Creatinine Clearance: 71 mL/min (by C-G formula based on SCr of 0.63 mg/dL). Liver Function Tests: Recent Labs  Lab 03/06/21 0224  AST 44*  ALT 48*  ALKPHOS 73  BILITOT 0.4  PROT 6.7  ALBUMIN 3.1*   No results for input(s): LIPASE, AMYLASE in the last 168 hours. No results for input(s): AMMONIA in the last 168 hours. Coagulation Profile: No results for input(s): INR, PROTIME in the last 168 hours. Cardiac Enzymes: No results for input(s): CKTOTAL, CKMB, CKMBINDEX, TROPONINI in the last 168 hours. BNP (last 3 results) No results for input(s): PROBNP in the last 8760 hours. HbA1C: No results for input(s): HGBA1C in the last 72 hours. CBG: Recent Labs  Lab 03/05/21 0632 03/05/21 1223 03/05/21 1539 03/05/21 2127 03/06/21 0650  GLUCAP 183* 160* 271* 148* 129*   Lipid Profile: No results for input(s): CHOL, HDL, LDLCALC, TRIG, CHOLHDL,  LDLDIRECT in the last 72 hours. Thyroid Function Tests: No results for input(s): TSH, T4TOTAL, FREET4, T3FREE, THYROIDAB in the last 72 hours. Anemia Panel: No results for input(s): VITAMINB12, FOLATE, FERRITIN, TIBC, IRON, RETICCTPCT in the last 72 hours. Sepsis Labs: No results for input(s): PROCALCITON, LATICACIDVEN in the last 168 hours.  No results found for this or any previous visit (from the past 240 hour(s)).   Radiology Studies: No results found.  Scheduled Meds:  benztropine  0.5 mg Oral Daily   enoxaparin (LOVENOX) injection  40 mg Subcutaneous Q24H   feeding supplement (GLUCERNA SHAKE)  237 mL Oral TID BM   free water  100 mL Per Tube Q8H   hydrocerin   Topical BID   insulin aspart  0-15 Units Subcutaneous TID WC  insulin aspart  0-5 Units Subcutaneous QHS   insulin aspart  7 Units Subcutaneous TID WC   insulin glargine-yfgn  25 Units Subcutaneous Daily   levETIRAcetam  1,000 mg Oral BID   multivitamin with minerals  1 tablet Oral Daily   valACYclovir  500 mg Oral QHS   Continuous Infusions:   LOS: 40 days   Zannie Cove, MD [facility]  03/06/2021, 11:07 AM

## 2021-03-06 NOTE — Progress Notes (Signed)
Physical Therapy Treatment Patient Details Name: [name] MRN: [id number] DOB: 10-May-1966 Today's Date: 03/06/2021   History of Present Illness Pt is a 55 y/o female admitted for neurological evaluation of suspected encephalopathy.  However, brain imaging was normal, EEG unremarkable, UA negative, urine drug screen negative, procalcitonin low, B12, ammonia, and TSH normal.  Acute metabolic encephalopathy was ruled out. Progressive mental decline over previous 2 days so another EEG and CT head performed 8/30 with normal results, no sign of seizures. Improving mental status 02/18/21. PMH: intellectual disability, bipolar disorder, seizure disorder, type II diabetes, hypertension, hyperlipidemia, immune deficiency disorder.    PT Comments    Pt admitted with above diagnosis. Pt was able to ambulate with min assist. Pt unaware of BM on arrival. Cleaned pt and then she was able to mobilize.  Will need 24 hour care due to cognitive deficits and needs min assist for gait due to impaired ability to attend to task as well as some balance issues. Will continue to follow pt.  Note that family requesting SNF. AGree pt will benefit.  Pt currently with functional limitations due to balance and endurance deficits. Pt will benefit from skilled PT to increase their independence and safety with mobility to allow discharge to the venue listed below.      Recommendations for follow up therapy are one component of a multi-disciplinary discharge planning process, led by the attending physician.  Recommendations may be updated based on patient status, additional functional criteria and insurance authorization.  Follow Up Recommendations  SNF;Supervision/Assistance - 24 hour (If SNF is denied, max HHPT,HHOT and HHaide if able)     Equipment Recommendations  Other (comment);3in1 (PT) (rollator (4 wheeled walker) with a seat, shower chair)    Recommendations for Other Services       Precautions / Restrictions  Precautions Precautions: Fall Precaution Comments: PEG tube Restrictions Weight Bearing Restrictions: No     Mobility  Bed Mobility Overal bed mobility: Needs Assistance Bed Mobility: Supine to Sit Rolling: Min guard Sidelying to sit: HOB elevated;Min guard       General bed mobility comments: Pt was able to come to EOB with command only.  Needed cues to slow down as she was somewhat impulsive    Transfers Overall transfer level: Needs assistance Equipment used: Rolling walker (2 wheeled) Transfers: Sit to/from Stand Sit to Stand: Min assist;From elevated surface Stand pivot transfers: Min assist       General transfer comment: Pt stood with min assist to steady only. Noted that pt lying in BM and purewick had leaked. Cleaned pt and transferred her to 3N1 to finish her BM.  She needed total assist to be cleaned and was unaware of BM. leans more to left in standing.  Ambulation/Gait Ambulation/Gait assistance: +2 safety/equipment;Min guard;Min assist Gait Distance (Feet): 250 Feet Assistive device: Rolling walker (2 wheeled) Gait Pattern/deviations: Decreased stride length;Step-through pattern;Narrow base of support;Trunk flexed;Drifts right/left Gait velocity: Decreased Gait velocity interpretation: <1.31 ft/sec, indicative of household ambulator General Gait Details: Min to  min guard more due to pt fatigue and poor attention to task, pt needs some increased cues for navigation and directions and slow processing.  Pt is safe wtih RW (rollator in room missing parts to adjust it). Needed cues for positioining in RW as well.   Stairs             Wheelchair Mobility    Modified Rankin (Stroke Patients Only)       Balance Overall balance assessment:  Needs assistance Sitting-balance support: Feet supported Sitting balance-Leahy Scale: Fair     Standing balance support: During functional activity;Single extremity supported;Bilateral upper extremity  supported Standing balance-Leahy Scale: Poor Standing balance comment: relies on at least 1 UE support                            Cognition Arousal/Alertness: Awake/alert Behavior During Therapy: Flat affect Overall Cognitive Status: History of cognitive impairments - at baseline Area of Impairment: Orientation;Attention;Memory;Following commands;Safety/judgement;Awareness;Problem solving                 Orientation Level: Disoriented to;Place;Time;Situation;Person Current Attention Level: Focused Memory: Decreased recall of precautions;Decreased short-term memory Following Commands: Follows one step commands consistently;Follows multi-step commands inconsistently Safety/Judgement: Decreased awareness of deficits;Decreased awareness of safety Awareness: Intellectual Problem Solving: Slow processing;Requires verbal cues;Requires tactile cues;Decreased initiation;Difficulty sequencing General Comments: Answered "yes" to most questions. Follows simple commands 90% of time.      Exercises      General Comments General comments (skin integrity, edema, etc.): Pt incontinent BM and urine.  Cleaned pt and asked nursign to replace purewick at end of session.      Pertinent Vitals/Pain Pain Assessment: No/denies pain    Home Living                      Prior Function            PT Goals (current goals can now be found in the care plan section) Acute Rehab PT Goals Patient Stated Goal: Not stated Progress towards PT goals: Progressing toward goals    Frequency    Min 3X/week      PT Plan Current plan remains appropriate    Co-evaluation              AM-PAC PT "6 Clicks" Mobility   Outcome Measure  Help needed turning from your back to your side while in a flat bed without using bedrails?: None Help needed moving from lying on your back to sitting on the side of a flat bed without using bedrails?: None Help needed moving to and from a  bed to a chair (including a wheelchair)?: A Little Help needed standing up from a chair using your arms (e.g., wheelchair or bedside chair)?: A Little Help needed to walk in hospital room?: A Little Help needed climbing 3-5 steps with a railing? : A Little 6 Click Score: 20    End of Session Equipment Utilized During Treatment: Gait belt Activity Tolerance: Patient tolerated treatment well Patient left: with call bell/phone within reach;in chair;with chair alarm set Nurse Communication: Mobility status PT Visit Diagnosis: Other abnormalities of gait and mobility (R26.89);Other symptoms and signs involving the nervous system (R29.898)     Time: 9622-2979 PT Time Calculation (min) (ACUTE ONLY): 21 min  Charges:  $Gait Training: 8-22 mins                     Hinata Diener M,PT Acute Rehab Services [phone] (pager)    Bevelyn Buckles 03/06/2021, 1:25 PM

## 2021-03-07 DIAGNOSIS — E876 Hypokalemia: Secondary | ICD-10-CM | POA: Diagnosis not present

## 2021-03-07 LAB — GLUCOSE, CAPILLARY
Glucose-Capillary: 182 mg/dL — ABNORMAL HIGH (ref 70–99)
Glucose-Capillary: 259 mg/dL — ABNORMAL HIGH (ref 70–99)
Glucose-Capillary: 312 mg/dL — ABNORMAL HIGH (ref 70–99)
Glucose-Capillary: 210 mg/dL — ABNORMAL HIGH (ref 70–99)
Glucose-Capillary: 234 mg/dL — ABNORMAL HIGH (ref 70–99)

## 2021-03-07 MED ORDER — METFORMIN HCL 500 MG PO TABS
500.0000 mg | ORAL_TABLET | Freq: Two times a day (BID) | ORAL | Status: DC
  Administered 2021-03-07 – 2021-03-25 (×35): 500 mg via ORAL
  Filled 2021-03-07 (×38): qty 1

## 2021-03-07 MED ORDER — LORATADINE 10 MG PO TABS
10.0000 mg | ORAL_TABLET | Freq: Every day | ORAL | Status: DC
  Administered 2021-03-07 – 2021-03-25 (×19): 10 mg via ORAL
  Filled 2021-03-07 (×19): qty 1

## 2021-03-07 MED ORDER — GLIMEPIRIDE 2 MG PO TABS
2.0000 mg | ORAL_TABLET | Freq: Every day | ORAL | Status: DC
  Administered 2021-03-08 – 2021-03-19 (×12): 2 mg via ORAL
  Filled 2021-03-07 (×14): qty 1

## 2021-03-07 MED ORDER — LORAZEPAM 0.5 MG PO TABS
0.5000 mg | ORAL_TABLET | Freq: Every day | ORAL | Status: DC
  Administered 2021-03-07 – 2021-03-25 (×19): 0.5 mg via ORAL
  Filled 2021-03-07 (×19): qty 1

## 2021-03-07 MED ORDER — RISPERIDONE 0.5 MG PO TABS
2.0000 mg | ORAL_TABLET | Freq: Every day | ORAL | Status: DC
  Administered 2021-03-07 – 2021-03-24 (×18): 2 mg via ORAL
  Filled 2021-03-07 (×18): qty 4

## 2021-03-07 NOTE — Plan of Care (Signed)
  Problem: Education: Goal: Knowledge of General Education information will improve Description: Including pain rating scale, medication(s)/side effects and non-pharmacologic comfort measures Outcome: Progressing   Problem: Health Behavior/Discharge Planning: Goal: Ability to manage health-related needs will improve Outcome: Progressing   Problem: Clinical Measurements: Goal: Ability to maintain clinical measurements within normal limits will improve Outcome: Progressing Goal: Will remain free from infection Outcome: Progressing   Problem: Activity: Goal: Risk for activity intolerance will decrease Outcome: Progressing   Problem: Nutrition: Goal: Adequate nutrition will be maintained Outcome: Progressing   Problem: Pain Managment: Goal: General experience of comfort will improve Outcome: Progressing   Problem: Safety: Goal: Ability to remain free from injury will improve Outcome: Progressing   Problem: Education: Goal: Expressions of having a comfortable level of knowledge regarding the disease process will increase Outcome: Progressing   Problem: Coping: Goal: Ability to adjust to condition or change in health will improve Outcome: Progressing Goal: Ability to identify appropriate support needs will improve Outcome: Progressing   Problem: Medication: Goal: Risk for medication side effects will decrease Outcome: Progressing   Problem: Safety: Goal: Verbalization of understanding the information provided will improve Outcome: Progressing

## 2021-03-07 NOTE — Progress Notes (Addendum)
[facility] PROGRESS NOTE  [name] ESL:[id number] DOB: [birth year] DOA: 01/23/2021 PCP: Mirna Mires, MD  Status: Remains inpatient appropriate because:Altered mental status, Unsafe d/c plan, IV treatments appropriate due to intensity of illness or inability to take PO, and Inpatient level of care appropriate due to severity of illness  Dispo: The patient is from: Home              Anticipated d/c is to: SNF: Unfortunately continues to have waxing and waning mental status and continues to require 24/7 care.  Sister requested SNF placement since she is unable to provide this care at home.              Patient currently is not medically stable to d/c.Marland Kitchen  Now that she has awakened need to ensure that patient can mobilize appropriately and navigate stairs before discharge home.   Difficult to place patient Yes   Level of care: Med-Surg  Code Status: Full Family Communication: Sister [name] 9/9 DVT prophylaxis: Lovenox COVID vaccination status: Unknown   HPI: 55 y.o. F with DM, HTN, developmental delay who presented with decreased oral intake and "saying bizarre things".   In the ER, CT head unremarkable.  She was hypoglycemic and otherwise had normal chemistries and hgb  Admitted for evaluation of erratic behavior.  Subjective: Patient awake.  Made continuous eye intact during my interaction with her but did not attempt to verbally respond until I was leaving the room.  Noted to be having some subtle twitching of right side of face below eye and around lips.  Objective: Vitals:   03/07/21 0257 03/07/21 0741  BP: 137/70 (!) 141/80  Pulse: 81 (!) 109  Resp: 17 14  Temp: 98.4 F (36.9 C) 98.4 F (36.9 C)  SpO2: 100% 96%    Intake/Output Summary (Last 24 hours) at 03/07/2021 0745 Last data filed at 03/07/2021 0000 Gross per 24 hour  Intake --  Output 800 ml  Net -800 ml   Filed Weights   02/19/21 0500 02/20/21 0432 02/22/21 0651  Weight: 73.7 kg 73.5 kg 71.6 kg     Exam: Constitutional: NAD, calm, flat affect initially but upon leaving room patient smiled and initiated conversation brief Respiratory: Lungs remain clear to auscultation, stable on room air no increased work of breathing with normal pulse oximetry reading Cardiac: Heart sounds S1-S2, regular pulse, she remains normotensive, no peripheral edema no JVD Abdomen: PEG tube placed 8/29. LBM 9/17 abdomen soft and nontender nondistended.  Ate about 80% of breakfast tray although it appears prior to my entering the room patient had fallen asleep with a spoon in her hand Neurologic: Cranial nerves II through XII are intact.  Patient moving all extremities equally with strength 4-5/5.  Sensation intact. Psychiatric: Awake and mostly alert with flat affect although did smile upon writer beginning to leave room and initiated some brief conversation.  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 Symptoms appear to have reemerged now the Ativan on as needed dosing only so after discussion with attending who stated patient's alertness and interaction did improve over the weekend with low-dose Ativan we have opted to begin Ativan 0.5 mg daily with first dose now. Because of lack of improvement/extremely poor oral intake during the hospitalization PEG tube which was placed on 8/29.  PEG can be discontinued in 4 to 6 weeks from date of insertion 9/19: Noted with continued waxing and waning mental status and although initially had been improving with mentation and ability to participate in therapies currently she is not able to return home with sister due to requirement of 24/7 care and sister has requested SNF placement  short-term. 9/19 we will go ahead and resume her preadmission Risperdal  Poor oral intake 2/2 fixed delusions/Malnutrition/Obesity PEG tube placed on 8/29 -can remove in 4 to 6 weeks after discharge Eating better-have asked nursing staff to obtain patient's preferences in order that she will eat better.  Last night she ate 100% of a PB&J sandwich when it was determined that is her favorite meal.   Seizure disorder POA Continue keppra -dose lowered at 1 point during this admission but currently back up to home dose of 1000 mg BID EEG last week without any acute seizure activity.  Patient with subtle facial twitching today as described above without any overt seizure activity-we will continue to follow  Physical deconditioning secondary to fixed delusion PT/OT aware of discharge plan to home.  Focus will be on stair navigation and increasing mobility-better able to participate after initial improvements with Ativan but has subsequently had decreased mentation and ability to participate with PT and now recommendation is for 24/7 care which the sister is unable to provide Hopeful with resumption of scheduled Ativan patient's ability to mobilize and participate with therapies will improve as well.   Diabetes mellitus 2 uncontrolled on long-term insulin Continue Semglee to 25 units daily and 7 units meal coverage TID AC Prior to admission patient was on Lantus 40 units HS (we are using Semglee ); she was also on metformin and Amaryl and these will be resumed over the next 24 hours Continue SSI  Hypokalemia Hypomagnesemia Resolved         Data Reviewed: Basic Metabolic Panel: Recent Labs  Lab 03/03/21 0326 03/05/21 0153 03/06/21 0224  NA 136 136 136  K 4.3 4.0 4.3  CL 101 102 101  CO2 26 26 26   GLUCOSE 201* 108* 128*  BUN 12 9 10   CREATININE 0.73 0.70 0.63  CALCIUM 9.6 9.7 9.5   Liver Function Tests: Recent Labs  Lab 03/06/21 0224  AST 44*  ALT 48*  ALKPHOS 73  BILITOT  0.4  PROT 6.7  ALBUMIN 3.1*   No results for input(s): LIPASE, AMYLASE in the last 168 hours. No results for input(s): AMMONIA in the last 168 hours. CBC: Recent Labs  Lab 03/03/21 0326 03/05/21 0153 03/06/21 0224  WBC 9.9 10.0 9.8  HGB 11.8* 12.6 12.9  HCT 37.4 39.8 40.2  MCV 92.8 91.7 91.4  PLT 349 361 352     CBG: Recent Labs  Lab 03/06/21 0650 03/06/21 1154 03/06/21 1604 03/06/21 2140 03/07/21 0611  GLUCAP 129* 186* 226* 81 182*    Scheduled Meds:  benztropine  0.5 mg Oral Daily   enoxaparin (LOVENOX) injection  40 mg Subcutaneous Q24H   feeding supplement (GLUCERNA SHAKE)  237 mL Oral TID BM   free water  100 mL Per Tube Q8H   hydrocerin   Topical BID   insulin aspart  0-15 Units Subcutaneous TID WC   insulin aspart  0-5 Units Subcutaneous QHS   insulin aspart  7 Units Subcutaneous TID WC   insulin glargine-yfgn  25 Units Subcutaneous Daily   levETIRAcetam  1,000 mg Oral BID   multivitamin with minerals  1 tablet Oral Daily  valACYclovir  500 mg Oral QHS      Principal Problem:   Acute encephalopathy Active Problems:   Hypoglycemia   Constipation   Bipolar disorder (HCC)   Diabetes (HCC)   AMS (altered mental status)   Poor appetite   Psychogenic loss of appetite   Intellectual delay   Consultants: Neurology Psychiatry  Procedures: EEG Long-term EEG  Antibiotics: Valacyclovir 8/15 through 8/28   Time spent: 15 minutes    Junious Silk ANP  [facility] 7 am - 330 pm/M-F for direct patient care and secure chat Please refer to Amion for contact info 41  days

## 2021-03-07 NOTE — Progress Notes (Signed)
Inpatient Rehabilitation Admissions Coordinator   Asked by SW, Madilyn Fireman, to assess patient for a possible [facility] admit. I met at bedside with patient as she was eating her lunch. Patient feeding herself, but non verbal. I contacted her sister, [name] by phone. She is her guardian. Patient has been living with her for close to 2 years and before that ,was at a group home for 13 years. Patient ambulated without AD, gave herself insulin, and required oversight for med management and some adls. She was verbal but over 2 months pta, [name] had noticed she was moving slower, and required some supervision assist for adls. Admitted due to not eating which was very unusual for her. Sister states she is far from her baseline with mobility and conversing with her. Sister reports she was on Risperdal, glimepiride, loratadine, metformin, insulin and Trazodone at home. Sister is requesting that her medication of pta be reviewed with what she is on now.Sister feels she needs 2 to 3 months of rehab prior to d/c home with her as before. Sister is a home health aide and began a new job 3 weeks ago, She plans to discuss with her employer a schedule that she and her son would be available to provide 24/7 supervision of patient at home. They are also moving to a second floor apartment soon. I discussed assessment with Arbie Cookey, SW and will follow up with [name] on Wednesday to further assess if caregiver supports would be available that could support a possible CIR admit rather than SNF. I updates Arbie Cookey, [place] of my assessment.  Danne Baxter, RN, MSN Rehab Admissions Coordinator [phone] 03/07/2021 2:13 PM

## 2021-03-07 NOTE — Progress Notes (Signed)
Occupational Therapy Treatment Patient Details Name: [name] MRN: [id number] DOB: 09/26/1965 Today's Date: 03/07/2021   History of present illness Pt is a 55 y/o female admitted for neurological evaluation of suspected encephalopathy.  However, brain imaging was normal, EEG unremarkable, UA negative, urine drug screen negative, procalcitonin low, B12, ammonia, and TSH normal.  Acute metabolic encephalopathy was ruled out. Progressive mental decline over previous 2 days so another EEG and CT head performed 8/30 with normal results, no sign of seizures. Improving mental status 02/18/21. PMH: intellectual disability, bipolar disorder, seizure disorder, type II diabetes, hypertension, hyperlipidemia, immune deficiency disorder.   OT comments  Patient seen by skilled OT to address functional mobility, grooming, and toilet transfers/hygiene. Patient had visitor present that aided in motivating patient.  Patient required assistance for guiding RW during mobility and supervision to min guard when standing for safety. Patient required verbal and tactile cues for sequencing for grooming and functional transfers.  Acute OT to continue to follow.    Recommendations for follow up therapy are one component of a multi-disciplinary discharge planning process, led by the attending physician.  Recommendations may be updated based on patient status, additional functional criteria and insurance authorization.    Follow Up Recommendations  SNF;Supervision/Assistance - 24 hour    Equipment Recommendations  3 in 1 bedside commode;Tub/shower seat    Recommendations for Other Services      Precautions / Restrictions Precautions Precautions: Fall Precaution Comments: PEG tube       Mobility Bed Mobility Overal bed mobility: Needs Assistance Bed Mobility: Supine to Sit Rolling: Min guard Sidelying to sit: HOB elevated;Min guard Supine to sit: Min assist     General bed mobility comments: vcs to get to  eob    Transfers Overall transfer level: Needs assistance Equipment used: Rolling walker (2 wheeled) Transfers: Sit to/from Stand Sit to Stand: Min guard;From elevated surface Stand pivot transfers: Min assist       General transfer comment: Patient was min assist due to assistance with RW    Balance Overall balance assessment: Needs assistance Sitting-balance support: Feet supported Sitting balance-Leahy Scale: Fair Sitting balance - Comments: no lob while sitting on eob   Standing balance support: No upper extremity supported;During functional activity Standing balance-Leahy Scale: Fair Standing balance comment: able to performed grooming activities using BUEs                           ADL either performed or assessed with clinical judgement   ADL Overall ADL's : Needs assistance/impaired     Grooming: Wash/dry hands;Wash/dry face;Oral care;Min guard;Standing;Minimal assistance Grooming Details (indicate cue type and reason): required min assist to load toothbrush and tactile and verbal cues for sequencing.                 Toilet Transfer: Min guard;BSC;Minimal assistance Toilet Transfer Details (indicate cue type and reason): BSC over toilet with vcs for safety Toileting- Clothing Manipulation and Hygiene: Maximal assistance;Sit to/from stand Toileting - Clothing Manipulation Details (indicate cue type and reason): Patient would not engage in toilet hygiene when prompted     Functional mobility during ADLs: Min guard;Rolling walker General ADL Comments: difficulty with sequencing on this date     Vision       Perception     Praxis      Cognition Arousal/Alertness: Awake/alert Behavior During Therapy: Flat affect Overall Cognitive Status: History of cognitive impairments - at baseline Area of Impairment: Orientation;Attention;Memory;Following  commands;Safety/judgement;Awareness;Problem solving                 Orientation Level:  Disoriented to;Place;Time;Situation;Person Current Attention Level: Focused Memory: Decreased recall of precautions;Decreased short-term memory Following Commands: Follows one step commands consistently;Follows multi-step commands inconsistently Safety/Judgement: Decreased awareness of deficits;Decreased awareness of safety Awareness: Intellectual Problem Solving: Slow processing;Requires verbal cues;Requires tactile cues;Decreased initiation;Difficulty sequencing General Comments: required tactile and vcs for grooming and toileting tasks        Exercises     Shoulder Instructions       General Comments      Pertinent Vitals/ Pain       Pain Assessment: No/denies pain  Home Living                                          Prior Functioning/Environment              Frequency  Min 2X/week        Progress Toward Goals  OT Goals(current goals can now be found in the care plan section)  Progress towards OT goals: Progressing toward goals  Acute Rehab OT Goals Patient Stated Goal: Not stated OT Goal Formulation: With patient Time For Goal Achievement: 02/25/21 Potential to Achieve Goals: Good ADL Goals Pt Will Perform Grooming: with min guard assist;sitting Pt Will Perform Upper Body Bathing: with min assist;sitting Pt Will Perform Lower Body Bathing: with supervision;sit to/from stand Pt Will Perform Upper Body Dressing: with min assist;sitting Pt Will Perform Lower Body Dressing: with supervision;sit to/from stand Pt Will Transfer to Toilet: with supervision;ambulating Pt Will Perform Toileting - Clothing Manipulation and hygiene: with supervision;sitting/lateral leans;sit to/from stand Additional ADL Goal #1: Complete grooming task while standing at sink with S  Plan Discharge plan remains appropriate    Co-evaluation                 AM-PAC OT "6 Clicks" Daily Activity     Outcome Measure   Help from another person eating meals?:  None Help from another person taking care of personal grooming?: A Little Help from another person toileting, which includes using toliet, bedpan, or urinal?: A Lot Help from another person bathing (including washing, rinsing, drying)?: A Little Help from another person to put on and taking off regular upper body clothing?: A Little Help from another person to put on and taking off regular lower body clothing?: A Little 6 Click Score: 18    End of Session Equipment Utilized During Treatment: Gait belt;Rolling walker  OT Visit Diagnosis: Other symptoms and signs involving cognitive function;Muscle weakness (generalized) (M62.81)   Activity Tolerance Patient tolerated treatment well   Patient Left in chair;with call bell/phone within reach;with chair alarm set   Nurse Communication Mobility status        Time: 2130-8657 OT Time Calculation (min): 35 min  Charges: OT General Charges $OT Visit: 1 Visit OT Treatments $Self Care/Home Management : 23-37 mins  Alfonse Flavors, OTA   Gurtha Picker Jeannett Senior 03/07/2021, 2:52 PM

## 2021-03-07 NOTE — Progress Notes (Signed)
Physical Therapy Treatment Patient Details Name: [name] MRN: [id number] DOB: [birth year] Today's Date: 03/07/2021   History of Present Illness Pt is a 55 y/o female admitted for neurological evaluation of suspected encephalopathy.  However, brain imaging was normal, EEG unremarkable, UA negative, urine drug screen negative, procalcitonin low, B12, ammonia, and TSH normal.  Acute metabolic encephalopathy was ruled out. Progressive mental decline over previous 2 days so another EEG and CT head performed 8/30 with normal results, no sign of seizures. Improving mental status 02/18/21. PMH: intellectual disability, bipolar disorder, seizure disorder, type II diabetes, hypertension, hyperlipidemia, immune deficiency disorder.    PT Comments    Pt progressing towards physical therapy goals. Was able to perform transfers and ambulation with gross min guard assist to min assist and rollator for support. Per chart review, noted CIR may be a possibility at d/c. Feel this patient would benefit from the multidisciplinary rehab available at CIR to maximize functional independence, safety, and decrease caregiver burden on sister when pt returns home.    Recommendations for follow up therapy are one component of a multi-disciplinary discharge planning process, led by the attending physician.  Recommendations may be updated based on patient status, additional functional criteria and insurance authorization.  Follow Up Recommendations  Supervision/Assistance - 24 hour;CIR     Equipment Recommendations  Other (comment);3in1 (PT) (rollator (4 wheeled walker) with a seat, shower chair)    Recommendations for Other Services       Precautions / Restrictions Precautions Precautions: Fall Precaution Comments: PEG tube Restrictions Weight Bearing Restrictions: No     Mobility  Bed Mobility Overal bed mobility: Needs Assistance Bed Mobility: Supine to Sit Rolling: Min guard Sidelying to sit: HOB  elevated;Min guard Supine to sit: Min assist     General bed mobility comments: Pt was received sitting up in recliner.    Transfers Overall transfer level: Needs assistance Equipment used: 4-wheeled walker Transfers: Sit to/from Stand Sit to Stand: Min guard;From elevated surface Stand pivot transfers: Min assist       General transfer comment: Increased time. No assist however close guard for safety.  Ambulation/Gait Ambulation/Gait assistance: Min guard;Min assist Gait Distance (Feet): 250 Feet Assistive device: 4-wheeled walker Gait Pattern/deviations: Decreased stride length;Step-through pattern;Narrow base of support;Trunk flexed;Drifts right/left Gait velocity: Decreased Gait velocity interpretation: 1.31 - 2.62 ft/sec, indicative of limited community ambulator General Gait Details: Assist for walker management to guide pt during directional tasks in the hallway. 1 LOB noted when turning to sit in the chair and trying to step around the rollator instead of turning with the rollator.   Stairs             Wheelchair Mobility    Modified Rankin (Stroke Patients Only)       Balance Overall balance assessment: Needs assistance Sitting-balance support: Feet supported Sitting balance-Leahy Scale: Fair Sitting balance - Comments: no lob while sitting on eob Postural control: Posterior lean Standing balance support: No upper extremity supported;During functional activity Standing balance-Leahy Scale: Fair Standing balance comment: able to performed grooming activities using BUEs                            Cognition Arousal/Alertness: Awake/alert Behavior During Therapy: Flat affect Overall Cognitive Status: History of cognitive impairments - at baseline Area of Impairment: Orientation;Attention;Memory;Following commands;Safety/judgement;Awareness;Problem solving                 Orientation Level: Disoriented  to;Place;Time;Situation;Person Current Attention  Level: Focused Memory: Decreased recall of precautions;Decreased short-term memory Following Commands: Follows one step commands consistently;Follows multi-step commands inconsistently Safety/Judgement: Decreased awareness of deficits;Decreased awareness of safety Awareness: Intellectual Problem Solving: Slow processing;Requires verbal cues;Requires tactile cues;Decreased initiation;Difficulty sequencing General Comments: Responding only "yeah" to questions throughout session. Following commands with increased time.      Exercises      General Comments        Pertinent Vitals/Pain Pain Assessment: Faces Faces Pain Scale: No hurt Pain Intervention(s): Monitored during session    Home Living                      Prior Function            PT Goals (current goals can now be found in the care plan section) Acute Rehab PT Goals Patient Stated Goal: Not stated PT Goal Formulation: Patient unable to participate in goal setting Time For Goal Achievement: 03/10/21 Potential to Achieve Goals: Good Progress towards PT goals: Progressing toward goals    Frequency    Min 3X/week      PT Plan Discharge plan needs to be updated    Co-evaluation              AM-PAC PT "6 Clicks" Mobility   Outcome Measure  Help needed turning from your back to your side while in a flat bed without using bedrails?: None Help needed moving from lying on your back to sitting on the side of a flat bed without using bedrails?: None Help needed moving to and from a bed to a chair (including a wheelchair)?: A Little Help needed standing up from a chair using your arms (e.g., wheelchair or bedside chair)?: A Little Help needed to walk in hospital room?: A Little Help needed climbing 3-5 steps with a railing? : A Little 6 Click Score: 20    End of Session Equipment Utilized During Treatment: Gait belt Activity Tolerance: Patient  tolerated treatment well Patient left: with call bell/phone within reach;in chair;with chair alarm set Nurse Communication: Mobility status PT Visit Diagnosis: Other abnormalities of gait and mobility (R26.89);Other symptoms and signs involving the nervous system (H96.222)     Time: 9798-9211 PT Time Calculation (min) (ACUTE ONLY): 22 min  Charges:  $Gait Training: 8-22 mins                     Conni Slipper, PT, DPT Acute Rehabilitation Services Pager: [phone] Office: [phone]    Marylynn Pearson 03/07/2021, 3:49 PM

## 2021-03-07 NOTE — Progress Notes (Addendum)
2pm: CSW requested NP place med reconciliation consult for patient as per the patient's sister's report the medications she is currently taking are far different than her home medications.  12:30pm: CSW spoke with Britta Mccreedy of [facility] to discuss patient - Britta Mccreedy to contact sister regarding goals of care.  Edwin Dada, MSW, LCSW Transitions of Care  Clinical Social Worker II [phone]

## 2021-03-08 DIAGNOSIS — G9340 Encephalopathy, unspecified: Secondary | ICD-10-CM | POA: Diagnosis not present

## 2021-03-08 LAB — GLUCOSE, CAPILLARY
Glucose-Capillary: 210 mg/dL — ABNORMAL HIGH (ref 70–99)
Glucose-Capillary: 228 mg/dL — ABNORMAL HIGH (ref 70–99)
Glucose-Capillary: 288 mg/dL — ABNORMAL HIGH (ref 70–99)
Glucose-Capillary: 145 mg/dL — ABNORMAL HIGH (ref 70–99)
Glucose-Capillary: 139 mg/dL — ABNORMAL HIGH (ref 70–99)

## 2021-03-08 MED ORDER — INSULIN ASPART 100 UNIT/ML IJ SOLN
8.0000 [IU] | Freq: Three times a day (TID) | INTRAMUSCULAR | Status: DC
  Administered 2021-03-08 – 2021-03-25 (×49): 8 [IU] via SUBCUTANEOUS

## 2021-03-08 MED ORDER — INSULIN GLARGINE-YFGN 100 UNIT/ML ~~LOC~~ SOLN
30.0000 [IU] | Freq: Every day | SUBCUTANEOUS | Status: DC
  Administered 2021-03-08 – 2021-03-25 (×17): 30 [IU] via SUBCUTANEOUS
  Filled 2021-03-08 (×18): qty 0.3

## 2021-03-08 NOTE — Progress Notes (Signed)
CSW spoke with Beverlyn Roux of [employer] who states she will complete the level 2 review either this afternoon or tomorrow.  Edwin Dada, MSW, LCSW Transitions of Care  Clinical Social Worker II [phone]

## 2021-03-08 NOTE — Progress Notes (Signed)
PROGRESS NOTE    [name]  WIO:[id number] DOB: [birth year] DOA: 01/23/2021 PCP: Mirna Mires, MD    Brief Narrative:  55 y.o. F with DM, HTN, developmental delay who presented with decreased oral intake and "saying bizarre things".   In the ER, CT head unremarkable.  She was hypoglycemic, rest of her work-up was unremarkable.  Admitted for evaluation of erratic behavior.  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 intake PEG tube was placed 8/29 and was started on tube feeds -Noted to have considerable fluctuation in mental status this past weekend, was extremely flat, less responsive/interactive, unable to eat, seem to do better after getting Ativan on Sunday, now started on low-dose Ativan every morning, monitor for sedation, stable today -TOC following for disposition   Poor oral intake 2/2 fixed delusions/Malnutrition/Obesity PEG tube placed on 8/29 -can remove in 4 to 6 weeks after discharge if oral intake remains stable and consistent -P.o. intake had been improving, off tube feeds   Seizure disorder POA Continue keppra    Physical deconditioning secondary to fixed delusion -PT OT following -Level of alertness continues to fluctuate, mobility reportedly improving -Dispo remains  unclear, 24-hour supervision recommended   Diabetes melitis 2 uncontrolled on long-term insulin -CBGs elevated, restarted on Semglee insulin, dose increased to 30 units, increase Premeal NovoLog  Hypokalemia Hypomagnesemia Resolved     DVT prophylaxis: Lovenox subq Code Status: Full Family Communication: Pt in room, no family at bedside  Status is: Inpatient  Remains inpatient appropriate because:Unsafe d/c plan  Dispo: The patient is from: Home              Anticipated d/c is to: SNF, anticipate need for long-term care              Patient currently is not medically stable to d/c.   Difficult to place patient Yes   Consultants:  Neurology Psychiatry  Procedures:  EEG Long-term EEG  Antimicrobials: Anti-infectives (From admission, onward)    Start     Dose/Rate Route Frequency Ordered Stop   02/23/21 2200  valACYclovir (VALTREX) tablet 500 mg        500 mg Oral Daily at bedtime 02/23/21 1621     02/15/21 2200  valACYclovir (VALTREX) tablet 500 mg  Status:  Discontinued        500 mg Per Tube Daily at bedtime 02/14/21 1145 02/23/21 1621   02/14/21 0916  vancomycin (VANCOCIN) IVPB 1000 mg/200 mL premix        over 60 Minutes Intravenous Continuous PRN 02/14/21 0916 02/14/21 0916   01/31/21 2215  valACYclovir (VALTREX) tablet 500 mg  Status:  Discontinued        500 mg Per Tube Daily at bedtime 01/31/21 2119 02/14/21 1145   01/25/21 2200  valACYclovir (VALTREX) tablet 500 mg  Status:  Discontinued        50 0 mg Oral Daily at bedtime 01/25/21 0930 01/31/21 2119  Subjective: -Awake and alert today, mumbles yeah, sitting up in bed eating breakfast  Objective: Vitals:   03/08/21 0835 03/08/21 0935 03/08/21 1000 03/08/21 1144  BP: 125/76 128/78 128/76 109/76  Pulse: (!) 128 99  100  Resp: 16 16  16   Temp: 98.2 F (36.8 C) 98.7 F (37.1 C) 98.3 F (36.8 C) 98.7 F (37.1 C)  TempSrc: Axillary Oral Oral Oral  SpO2: 97% 98% 98% 99%  Weight:         Intake/Output Summary (Last 24 hours) at 03/08/2021 1359 Last data filed at 03/08/2021 03/10/2021 Gross per 24 hour  Intake 420 ml  Output 3600 ml  Net -3180 ml    Filed Weights   02/19/21 0500 02/20/21 0432 02/22/21 0651  Weight: 73.7 kg 73.5 kg 71.6 kg    Examination: Gen: Pleasant middle-aged female sitting up in bed, awake and alert, more interactive today, mumbles yes/yeah  CVS: S1-S2, regular rate rhythm Lungs: Clear bilaterally Abdomen: Soft, nontender, bowel sounds present Extremities: No edema Neuro: Moves all extremities, increased muscle tone in upper extremities  Skin: no new rashes on exposed skin   Data Reviewed: I have personally reviewed following labs and imaging studies  CBC: Recent Labs  Lab 03/03/21 0326 03/05/21 0153 03/06/21 0224  WBC 9.9 10.0 9.8  HGB 11.8* 12.6 12.9  HCT 37.4 39.8 40.2  MCV 92.8 91.7 91.4  PLT 349 361 352    Basic Metabolic Panel: Recent Labs  Lab 03/03/21 0326 03/05/21 0153 03/06/21 0224  NA 136 136 136  K 4.3 4.0 4.3  CL 101 102 101  CO2 26 26 26   GLUCOSE 201* 108* 128*  BUN 12 9 10   CREATININE 0.73 0.70 0.63  CALCIUM 9.6 9.7 9.5   GFR: Estimated Creatinine Clearance: 71 mL/min (by C-G formula based on SCr of 0.63 mg/dL). Liver Function Tests: Recent Labs  Lab 03/06/21 0224  AST 44*  ALT 48*  ALKPHOS 73  BILITOT 0.4  PROT 6.7  ALBUMIN 3.1*   No results for input(s): LIPASE, AMYLASE in the last 168 hours. No results for input(s): AMMONIA in the last 168 hours. Coagulation Profile: No results for input(s): INR, PROTIME in the last 168 hours. Cardiac Enzymes: No results for input(s): CKTOTAL, CKMB, CKMBINDEX, TROPONINI in the last 168 hours. BNP (last 3 results) No results for input(s): PROBNP in the last 8760 hours. HbA1C: No results for input(s): HGBA1C in the last 72 hours. CBG: Recent Labs  Lab 03/07/21 2104 03/07/21 2129 03/08/21 0616 03/08/21 0630 03/08/21 1146  GLUCAP 234* 210* 210* 228*  288*   Lipid Profile: No results for input(s): CHOL, HDL, LDLCALC, TRIG, CHOLHDL, LDLDIRECT in the last 72 hours. Thyroid Function Tests: No results for input(s): TSH, T4TOTAL, FREET4, T3FREE, THYROIDAB in the last 72 hours. Anemia Panel: No results for input(s): VITAMINB12, FOLATE, FERRITIN, TIBC, IRON, RETICCTPCT in the last 72 hours. Sepsis Labs: No results for input(s): PROCALCITON, LATICACIDVEN in the last 168 hours.  No results found for this or any previous visit (from the past 240 hour(s)).   Radiology Studies: No results found.  Scheduled Meds:  benztropine  0.5 mg Oral Daily   enoxaparin (LOVENOX) injection  40 mg Subcutaneous Q24H   feeding supplement (GLUCERNA SHAKE)  237 mL Oral TID BM   free water  100 mL Per Tube Q8H   glimepiride  2 mg Oral Q breakfast   hydrocerin   Topical BID   insulin aspart  0-15 Units Subcutaneous TID WC   insulin  aspart  0-5 Units Subcutaneous QHS   insulin aspart  8 Units Subcutaneous TID WC   insulin glargine-yfgn  30 Units Subcutaneous Daily   levETIRAcetam  1,000 mg Oral BID   loratadine  10 mg Oral Daily   LORazepam  0.5 mg Oral QAC breakfast   metFORMIN  500 mg Oral BID WC   multivitamin with minerals  1 tablet Oral Daily   risperiDONE  2 mg Oral QHS   valACYclovir  500 mg Oral QHS   Continuous Infusions:   LOS: 42 days   Zannie Cove, MD [facility]  03/08/2021, 1:59 PM

## 2021-03-08 NOTE — Progress Notes (Signed)
Physical Therapy Treatment Patient Details Name: [name] MRN: [id number] DOB: February 12, 1966 Today's Date: 03/08/2021   History of Present Illness Pt is a 55 y/o female admitted for neurological evaluation of suspected encephalopathy.  However, brain imaging was normal, EEG unremarkable, UA negative, urine drug screen negative, procalcitonin low, B12, ammonia, and TSH normal.  Acute metabolic encephalopathy was ruled out. Progressive mental decline over previous 2 days so another EEG and CT head performed 8/30 with normal results, no sign of seizures. Improving mental status 02/18/21. PMH: intellectual disability, bipolar disorder, seizure disorder, type II diabetes, hypertension, hyperlipidemia, immune deficiency disorder.    PT Comments    Pt received in supine, alert and agreeable to therapy session, pt unable to state anything other than "yeah" but participatory as able. Pt HR elevated to 130's bpm with exertion (resting HR ~100 bpm) but no other acute s/sx distress and SpO2 WFL.  Pt performed transfers and gait progression with minA to min guard assist, needing frequent safety cues for proper use of rollator (brakes/hand placement). Pt with decreased environmental awareness/directional navigation and needs multimodal cues for turning in hallway. Pt continues to benefit from PT services to progress toward functional mobility goals.   Recommendations for follow up therapy are one component of a multi-disciplinary discharge planning process, led by the attending physician.  Recommendations may be updated based on patient status, additional functional criteria and insurance authorization.  Follow Up Recommendations  Supervision/Assistance - 24 hour;CIR     Equipment Recommendations  Other (comment);3in1 (PT) (rollator (4 wheeled walker) with a seat, shower chair)    Recommendations for Other Services       Precautions / Restrictions Precautions Precautions: Fall Precaution Comments: PEG  tube Restrictions Weight Bearing Restrictions: No     Mobility  Bed Mobility Overal bed mobility: Needs Assistance Bed Mobility: Supine to Sit Rolling: Min guard Sidelying to sit: HOB elevated;Min guard       General bed mobility comments: increased time, use of bed features    Transfers Overall transfer level: Needs assistance Equipment used: None Transfers: Sit to/from [employer] Sit to Stand: Min guard;Min assist Stand pivot transfers: Min guard       General transfer comment: Increased time. No assist however close guard for safety. Pt pivoted to her R from EOB without AD and no LOB, but utilized rollator for room/hallway gait; up to minA for stand>sit to lower toilet due to decreased coordination  Ambulation/Gait Ambulation/Gait assistance: Min guard Gait Distance (Feet): 100 Feet (156ft, seated break, 145ft x2 with standing break) Assistive device: 4-wheeled walker Gait Pattern/deviations: Decreased stride length;Step-through pattern;Narrow base of support;Trunk flexed;Drifts right/left Gait velocity: Decreased   General Gait Details: Min guard for safety, pt needs reminders and some tactile cues for use of brakes, no overt LOB and fair use of AD but needs verbal and tactile cues for directional navigation today.   Stairs Stairs: Yes Stairs assistance: Min guard;+2 safety/equipment Stair Management: Two rails;Step to pattern;Forwards Number of Stairs: 13 General stair comments: cues for safety/sequencing, pt at times skipping bottom step and turning around prior to performing, needs min guard/tactile cues to turn back around and perform final step; no overt LOB   Wheelchair Mobility    Modified Rankin (Stroke Patients Only)       Balance Overall balance assessment: Needs assistance Sitting-balance support: Feet supported Sitting balance-Leahy Scale: Fair Sitting balance - Comments: no lob while sitting on eob Postural control: Posterior  lean Standing balance support: No upper extremity  supported;During functional activity Standing balance-Leahy Scale: Fair Standing balance comment: able to perform grooming activities using BUEs but pt needs tactile cues for leaning forward to reach when standing toward soap at sink - no LOB however once leaning/reaching ~3-5" outside BOS                            Cognition Arousal/Alertness: Awake/alert Behavior During Therapy: Flat affect Overall Cognitive Status: History of cognitive impairments - at baseline Area of Impairment: Orientation;Attention;Memory;Following commands;Safety/judgement;Awareness;Problem solving                 Orientation Level: Disoriented to;Place;Time;Situation;Person Current Attention Level: Focused Memory: Decreased recall of precautions;Decreased short-term memory Following Commands: Follows one step commands consistently;Follows multi-step commands inconsistently Safety/Judgement: Decreased awareness of deficits;Decreased awareness of safety Awareness: Intellectual Problem Solving: Slow processing;Requires verbal cues;Requires tactile cues;Decreased initiation;Difficulty sequencing General Comments: Responding only "yeah" to questions throughout session. Following commands with increased time. Attempted to elicit a "no" response and give pt two choices and pt having difficulty stating "no" but says "...yeah?". Unable to assess orientation due to this.      Exercises      General Comments General comments (skin integrity, edema, etc.): HR elevated to 139 bpm with exertion (HR 130 bpm after resting ~2 mins in chair), SpO2 98-99% on RA      Pertinent Vitals/Pain Pain Assessment: No/denies pain Faces Pain Scale: No hurt Pain Intervention(s): Monitored during session;Repositioned    Home Living                      Prior Function            PT Goals (current goals can now be found in the care plan section) Acute Rehab  PT Goals Patient Stated Goal: Not stated PT Goal Formulation: Patient unable to participate in goal setting Time For Goal Achievement: 03/10/21 Potential to Achieve Goals: Good Progress towards PT goals: Progressing toward goals    Frequency    Min 3X/week      PT Plan Current plan remains appropriate    Co-evaluation              AM-PAC PT "6 Clicks" Mobility   Outcome Measure  Help needed turning from your back to your side while in a flat bed without using bedrails?: None Help needed moving from lying on your back to sitting on the side of a flat bed without using bedrails?: None Help needed moving to and from a bed to a chair (including a wheelchair)?: A Little Help needed standing up from a chair using your arms (e.g., wheelchair or bedside chair)?: A Little Help needed to walk in hospital room?: A Little Help needed climbing 3-5 steps with a railing? : A Little 6 Click Score: 20    End of Session Equipment Utilized During Treatment: Gait belt Activity Tolerance: Patient tolerated treatment well Patient left: with call bell/phone within reach;in chair;with chair alarm set Nurse Communication: Mobility status (RN agreeable to pt up in chair) PT Visit Diagnosis: Other abnormalities of gait and mobility (R26.89);Other symptoms and signs involving the nervous system (R29.898)     Time: 1517-6160 PT Time Calculation (min) (ACUTE ONLY): 26 min  Charges:  $Gait Training: 8-22 mins $Therapeutic Activity: 8-22 mins                     Albin Duckett P., PTA Acute Rehabilitation Services Pager: [phone] Office: [phone]  Dorathy Kinsman Al Bracewell 03/08/2021, 12:29 PM

## 2021-03-08 NOTE — Progress Notes (Signed)
Inpatient Diabetes Program Recommendations  AACE/ADA: New Consensus Statement on Inpatient Glycemic Control (2015)  Target Ranges:  Prepandial:   less than 140 mg/dL      Peak postprandial:   less than 180 mg/dL (1-2 hours)      Critically ill patients:  140 - 180 mg/dL   Lab Results  Component Value Date   GLUCAP 228 (H) 03/08/2021   HGBA1C 6.4 (H) 01/23/2021    Review of Glycemic Control Results for [name], [name] (MRN [id number]) as of 03/08/2021 09:25  Ref. Range 03/07/2021 21:04 03/07/2021 21:29 03/08/2021 06:16 03/08/2021 06:30  Glucose-Capillary Latest Ref Range: 70 - 99 mg/dL 217 (H) 981 (H) 025 (H) 228 (H)   Home DM Meds: Amaryl 2 mg daily                             Novolog 1-7 units TID per SSI                             Lantus 40 units QHS                             Metformin 500 mg BID     Current Orders: Semglee 30 units Daily                            Novolog Moderate Correction Scale/ SSI (0-15 units) TID AC + HS                            Novolog 8 units TID with meals       Metformin 500 mg BID       Amaryl 2 mg QD       Consider the following in-hospital insulin adjustments:   Increase Semglee to 35 units Daily and increase Novolog to 10 units TID (Assuming patient is consuming >50% of meals).      Thanks, Lujean Rave, MSN, RNC-OB Diabetes Coordinator [phone] (8a-5p)

## 2021-03-09 DIAGNOSIS — G9340 Encephalopathy, unspecified: Secondary | ICD-10-CM | POA: Diagnosis not present

## 2021-03-09 LAB — GLUCOSE, CAPILLARY
Glucose-Capillary: 170 mg/dL — ABNORMAL HIGH (ref 70–99)
Glucose-Capillary: 272 mg/dL — ABNORMAL HIGH (ref 70–99)
Glucose-Capillary: 215 mg/dL — ABNORMAL HIGH (ref 70–99)
Glucose-Capillary: 271 mg/dL — ABNORMAL HIGH (ref 70–99)

## 2021-03-09 NOTE — Progress Notes (Signed)
[facility] PROGRESS NOTE  BERGEN MELLE OZH:[id number] DOB: [birth year] DOA: 01/23/2021 PCP: Mirna Mires, MD  Status: Remains inpatient appropriate because:Altered mental status, Unsafe d/c plan, IV treatments appropriate due to intensity of illness or inability to take PO, and Inpatient level of care appropriate due to severity of illness  Dispo: The patient is from: Home              Anticipated d/c is to: SNF: Unfortunately continues to have waxing and waning mental status and continues to require 24/7 care.  Sister requested SNF placement since she is unable to provide this care at home.              Patient currently is not medically stable to d/c.Marland Kitchen  Now that she has awakened need to ensure that patient can mobilize appropriately and navigate stairs before discharge home.   Difficult to place patient Yes   Level of care: Med-Surg  Code Status: Full Family Communication: Sister [name] 9/21-made aware that recent altered mentation related to reemergence of catatonia which is resolving with the addition of benzodiazepine. DVT prophylaxis: Lovenox COVID vaccination status: Unknown   HPI: 55 y.o. F with DM, HTN, developmental delay who presented with decreased oral intake and "saying bizarre things".   In the ER, CT head unremarkable.  She was hypoglycemic and otherwise had normal chemistries and hgb  Admitted for evaluation of erratic behavior.  Subjective: Patient alert, making appropriate eye contact, smiling and engaging in conversation when spoken to.  Objective: Vitals:   03/09/21 0000 03/09/21 0332  BP: 120/75 130/79  Pulse: (!) 105 (!) 104  Resp: 18 20  Temp: 98 F (36.7 C) 98 F (36.7 C)  SpO2: 98% 99%    Intake/Output Summary (Last 24 hours) at 03/09/2021 0739 Last data filed at 03/09/2021 0017 Gross per 24 hour  Intake 100 ml  Output 1400 ml  Net -1300 ml   Filed Weights   02/20/21 0432 02/22/21 0651 03/09/21 0612  Weight: 73.5 kg 71.6 kg 71.7 kg     Exam: Constitutional: NAD, calm, initially flat but upon being spoken to began to smile and interact appropriately Respiratory: Lungs clear, stable on room air, no increased work of breathing.  Normal pulse oximetry Cardiac: S1-S2, regular pulse, she remains normotensive Abdomen: PEG tube site unremarkable and currently not used except for flushes LBM 9/19 abdomen soft and nontender nondistended.  Ate about 80% of breakfast tray although it appears prior to my entering the room patient had fallen asleep with a spoon in her hand Neurologic: Cranial nerves II through XII are intact.  Patient moving all extremities equally with strength 4-5/5.  Sensation intact. Psychiatric: Awake and and alert, interactive in conversation and smiling.  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 Because of lack of improvement/extremely poor oral intake PEG tube was placed on 8/29.  PEG can be discontinued in 4 to 6 weeks from date of insertion As of 9/21 patient's mentation has improved considerably after resuming Ativan 0.5 mg daily.  It is suspected her recent change in mentation was related to recurrent catatonia Continue Risperdal  Poor oral intake 2/2 fixed delusions/Malnutrition/Obesity PEG tube placed on 8/29 -can remove in 4 to 6 weeks after discharge Continue oral diet-expect improvement in intake now that catatonia symptoms improved  Seizure disorder POA Continue keppra Recent EEG unremarkable.    Physical deconditioning  PT/OT aware of discharge plan to home.  Focus will be on stair navigation and increasing mobility Patient now significantly improved with mentation will be able to work more  appropriately with therapy We will also place order to walk patient in halls so nursing staff can assist with mobilization   Diabetes mellitus 2 uncontrolled on long-term insulin Continue Semglee to 25 units daily and 7 units meal coverage TID AC Prior to admission patient was on Lantus 40 units HS (we are using Semglee ) Continue metformin and Amaryl  Continue SSI  Hypokalemia Hypomagnesemia Resolved         Data Reviewed: Basic Metabolic Panel: Recent Labs  Lab 03/03/21 0326 03/05/21 0153 03/06/21 0224  NA 136 136 136  K 4.3 4.0 4.3  CL 101 102 101  CO2 26 26 26   GLUCOSE 201* 108* 128*  BUN 12 9 10   CREATININE 0.73 0.70 0.63  CALCIUM 9.6 9.7 9.5   Liver Function Tests: Recent Labs  Lab 03/06/21 0224  AST 44*  ALT 48*  ALKPHOS 73  BILITOT 0.4  PROT 6.7  ALBUMIN 3.1*   No results for input(s): LIPASE, AMYLASE in the last 168 hours. No results for input(s): AMMONIA in the last 168 hours. CBC: Recent Labs  Lab 03/03/21 0326 03/05/21 0153 03/06/21 0224  WBC 9.9 10.0 9.8  HGB 11.8* 12.6 12.9  HCT 37.4 39.8 40.2  MCV 92.8 91.7 91.4  PLT 349 361 352     CBG: Recent Labs  Lab 03/08/21 0630 03/08/21 1146 03/08/21 1609 03/08/21 2128 03/09/21 0635  GLUCAP 228* 288* 145* 139* 170*    Scheduled Meds:  benztropine  0.5 mg Oral Daily   enoxaparin (LOVENOX) injection  40 mg Subcutaneous Q24H   feeding supplement (GLUCERNA SHAKE)  237 mL Oral TID BM   free water  100 mL Per Tube Q8H   glimepiride  2 mg Oral Q breakfast   hydrocerin   Topical BID   insulin aspart  0-15 Units Subcutaneous TID WC   insulin aspart  0-5 Units Subcutaneous QHS   insulin aspart  8 Units Subcutaneous TID WC   insulin glargine-yfgn  30 Units Subcutaneous Daily   levETIRAcetam  1,000 mg Oral BID   loratadine  10 mg Oral Daily   LORazepam  0.5 mg Oral QAC breakfast   metFORMIN  500 mg Oral BID WC   multivitamin with minerals  1 tablet Oral Daily   risperiDONE  2 mg Oral  QHS   valACYclovir  500 mg Oral QHS      Principal Problem:   Acute encephalopathy Active Problems:   Hypoglycemia   Constipation   Bipolar disorder (HCC)   Diabetes (HCC)   AMS (altered mental status)   Poor appetite   Psychogenic loss of appetite   Intellectual delay   Consultants: Neurology Psychiatry  Procedures: EEG Long-term EEG  Antibiotics: Valacyclovir 8/15 through 8/28   Time spent: 15 minutes    9/15 ANP  [facility] 7 am - 330 pm/M-F for direct patient care and secure chat Please refer to Amion for contact info 43  days

## 2021-03-09 NOTE — Plan of Care (Signed)
  Problem: Education: Goal: Expressions of having a comfortable level of knowledge regarding the disease process will increase Outcome: Progressing   Problem: Coping: Goal: Ability to adjust to condition or change in health will improve Outcome: Progressing Goal: Ability to identify appropriate support needs will improve Outcome: Progressing   Problem: Health Behavior/Discharge Planning: Goal: Compliance with prescribed medication regimen will improve Outcome: Progressing   Problem: Medication: Goal: Risk for medication side effects will decrease Outcome: Progressing   Problem: Clinical Measurements: Goal: Complications related to the disease process, condition or treatment will be avoided or minimized Outcome: Progressing Goal: Diagnostic test results will improve Outcome: Progressing   Problem: Self-Concept: Goal: Level of anxiety will decrease Outcome: Progressing Goal: Ability to verbalize feelings about condition will improve Outcome: Progressing   

## 2021-03-10 DIAGNOSIS — G9340 Encephalopathy, unspecified: Secondary | ICD-10-CM | POA: Diagnosis not present

## 2021-03-10 LAB — COMPREHENSIVE METABOLIC PANEL
ALT: 38 U/L (ref 0–44)
AST: 43 U/L — ABNORMAL HIGH (ref 15–41)
Albumin: 3 g/dL — ABNORMAL LOW (ref 3.5–5.0)
Alkaline Phosphatase: 73 U/L (ref 38–126)
Anion gap: 8 (ref 5–15)
BUN: 8 mg/dL (ref 6–20)
CO2: 25 mmol/L (ref 22–32)
Calcium: 9.4 mg/dL (ref 8.9–10.3)
Chloride: 101 mmol/L (ref 98–111)
Creatinine, Ser: 0.72 mg/dL (ref 0.44–1.00)
GFR, Estimated: >60 mL/min (ref >60)
Glucose, Bld: 247 mg/dL — ABNORMAL HIGH (ref 70–99)
Potassium: 4 mmol/L (ref 3.5–5.1)
Sodium: 134 mmol/L — ABNORMAL LOW (ref 135–145)
Total Bilirubin: 0.6 mg/dL (ref 0.3–1.2)
Total Protein: 6.7 g/dL (ref 6.5–8.1)

## 2021-03-10 LAB — GLUCOSE, CAPILLARY
Glucose-Capillary: 137 mg/dL — ABNORMAL HIGH (ref 70–99)
Glucose-Capillary: 132 mg/dL — ABNORMAL HIGH (ref 70–99)
Glucose-Capillary: 174 mg/dL — ABNORMAL HIGH (ref 70–99)
Glucose-Capillary: 108 mg/dL — ABNORMAL HIGH (ref 70–99)

## 2021-03-10 MED ORDER — BENZOCAINE 10 % MT GEL
Freq: Four times a day (QID) | OROMUCOSAL | Status: DC | PRN
  Filled 2021-03-10: qty 9

## 2021-03-10 NOTE — Progress Notes (Signed)
Physical Therapy Progress Note  Assessment: Pt progressing towards physical therapy goals. Sister, [name], present at beginning of session and we discussed family's plan/goals. [name] states she would like to see more consistent progress as pt was waxing and waning more last week but is open to CIR. Overall pt was happy and motivated to work with PT this date. From a PT standpoint, feel pt would benefit from continued multidisciplinary therapies at the CIR level. Will continue to follow.     03/10/21 1311  PT Visit Information  Last PT Received On 03/10/21  Assistance Needed +1  History of Present Illness Pt is a 55 y/o female admitted for neurological evaluation of suspected encephalopathy.  However, brain imaging was normal, EEG unremarkable, UA negative, urine drug screen negative, procalcitonin low, B12, ammonia, and TSH normal.  Acute metabolic encephalopathy was ruled out. Progressive mental decline over previous 2 days so another EEG and CT head performed 8/30 with normal results, no sign of seizures. Improving mental status 02/18/21. PMH: intellectual disability, bipolar disorder, seizure disorder, type II diabetes, hypertension, hyperlipidemia, immune deficiency disorder.  Subjective Data  Patient Stated Goal Not stated  Precautions  Precautions Fall  Precaution Comments PEG tube  Restrictions  Weight Bearing Restrictions No  Other Position/Activity Restrictions Increased alertness and very interactive  Pain Assessment  Pain Assessment Faces  Faces Pain Scale 0  Pain Intervention(s) Monitored during session  Cognition  Arousal/Alertness Awake/alert  Behavior During Therapy Flat affect  Overall Cognitive Status Impaired/Different from baseline  Area of Impairment Orientation;Attention;Memory;Following commands;Safety/judgement;Awareness;Problem solving  Orientation Level Disoriented to;Place;Situation (Oriented to self and year. Knows her sister, [name] who was present at beginning of  session.)  Current Attention Level Focused  Memory Decreased recall of precautions;Decreased short-term memory  Following Commands Follows one step commands consistently;Follows multi-step commands inconsistently;Follows one step commands with increased time  Safety/Judgement Decreased awareness of deficits;Decreased awareness of safety  Awareness Intellectual  Problem Solving Slow processing;Requires verbal cues;Requires tactile cues;Decreased initiation;Difficulty sequencing  General Comments More conversant today.  Bed Mobility  Overal bed mobility Needs Assistance  Bed Mobility Supine to Sit  Supine to sit Supervision  General bed mobility comments Pt excited to get up with therapy - began attempting to sit up and scoot between bed rails that were up. She was able to slow down with cues and wait for me to get the railings down, purewick removed, etc. No assist physically to transition to EOB.  Transfers  Overall transfer level Needs assistance  Equipment used None;4-wheeled walker  Transfers Sit to/from Stand  Sit to Stand Supervision  General transfer comment Multimodal cues to initiate power-up to full stand. No assist required.  Ambulation/Gait  Ambulation/Gait assistance Supervision;Min assist  Assistive device 4-wheeled walker  Gait Pattern/deviations Decreased stride length;Step-through pattern;Narrow base of support;Trunk flexed;Drifts right/left  General Gait Details Decreased awareness of other people/equipment in the hall and occasional assist to keep pt from walking into a group of people, stepping in front of transport with moving a bed, and hitting equipment in the hall with the rollator.  Gait velocity Decreased  Gait velocity interpretation 1.31 - 2.62 ft/sec, indicative of limited community ambulator  Balance  Overall balance assessment Needs assistance  Sitting-balance support Feet supported  Sitting balance-Leahy Scale Fair  Sitting balance - Comments no lob while  sitting on eob  Postural control Posterior lean  Standing balance support No upper extremity supported;During functional activity  Standing balance-Leahy Scale Fair  Standing balance comment able to perform grooming activities  using BUEs but pt needs tactile cues for leaning forward to reach when standing toward soap at sink - no LOB however once leaning/reaching ~3-5" outside BOS  PT - End of Session  Equipment Utilized During Treatment Gait belt  Activity Tolerance Patient tolerated treatment well  Patient left with call bell/phone within reach;in chair;with chair alarm set  Nurse Communication Mobility status   PT - Assessment/Plan  PT Plan Current plan remains appropriate  PT Visit Diagnosis Other abnormalities of gait and mobility (R26.89);Other symptoms and signs involving the nervous system (R29.898)  PT Frequency (ACUTE ONLY) Min 3X/week  Follow Up Recommendations Supervision/Assistance - 24 hour;CIR  PT equipment Other (comment);3in1 (PT) (rollator (4 wheeled walker) with a seat, shower chair)  AM-PAC PT "6 Clicks" Mobility Outcome Measure (Version 2)  Help needed turning from your back to your side while in a flat bed without using bedrails? 4  Help needed moving from lying on your back to sitting on the side of a flat bed without using bedrails? 4  Help needed moving to and from a bed to a chair (including a wheelchair)? 3  Help needed standing up from a chair using your arms (e.g., wheelchair or bedside chair)? 3  Help needed to walk in hospital room? 3  Help needed climbing 3-5 steps with a railing?  3  6 Click Score 20  Consider Recommendation of Discharge To: Home with no services  Progressive Mobility  What is the highest level of mobility based on the progressive mobility assessment? Level 5 (Walks with assist in room/hall) - Balance while stepping forward/back and can walk in room with assist - Complete  Mobility Ambulated with assistance in hallway  PT Goal Progression   Progress towards PT goals Progressing toward goals  Acute Rehab PT Goals  PT Goal Formulation With family  Time For Goal Achievement 03/24/21  Potential to Achieve Goals Good  PT Time Calculation  PT Start Time (ACUTE ONLY) 1103  PT Stop Time (ACUTE ONLY) 1130  PT Time Calculation (min) (ACUTE ONLY) 27 min  PT General Charges  $$ ACUTE PT VISIT 1 Visit  PT Treatments  $Gait Training 23-37 mins   Conni Slipper, PT, DPT Acute Rehabilitation Services Pager: [phone] Office: [phone]

## 2021-03-10 NOTE — Progress Notes (Signed)
Nutrition Follow-up  DOCUMENTATION CODES:   Not applicable  INTERVENTION:   -Continue Glucerna Shake po TID, each supplement provides 220 kcal and 10 grams of protein  -Continue MVI with minerals daily  NUTRITION DIAGNOSIS:   Inadequate oral intake related to lethargy/confusion as evidenced by meal completion < 25%, per patient/family report.  Ongoing  GOAL:   Patient will meet greater than or equal to 90% of their needs  Progressing   MONITOR:   PO intake, Supplement acceptance, Labs, Weight trends, Skin, I & O's  REASON FOR ASSESSMENT:   Consult Enteral/tube feeding initiation and management  ASSESSMENT:   Pt with PMH significant of intellectual disability, bipolar disorder, seizure disorder, type 2 DM, HTN, HLD, immunodeficiency disorder presented to the ED for change in mental status as reported by patient's sister for the past couple of days at home. CT head, MRI brain, EEG, and other neurologic work-up have been negative. Abdominal x-ray showed moderate stool burden. Pt does have h/o constipation and was recommended by GI to take Linzess and Dulcolax, though pt has been noncompliant. Pt has outpatient colonoscopy scheduled.  Reviewed I/O's: -1.8 L x 24 hours and-10.9 L since 02/24/21  UOP: 2.6 L x 24 hours  Pt sleeping soundly at time of visit.   Observed breakfast tray; pt consumed 100%. Noted meal completions 100%. Pt is also taking Glucerna supplements.   Reviewed wt hx; wt has been stable over the past month.   Medications reviewed and include ativan and keppra.   Lab Results  Component Value Date   HGBA1C 6.4 (H) 01/23/2021   PTA DM medications are .   Labs reviewed: Na: 134, CBGS: 132-271 (inpatient orders for glycemic control are 500 mg metformin BID, 2 mg glimepiride daily at breakfast, 0-15 units insulin aspart TID with meals, 0-5 units insulin aspart daily at bedtime, 8 units insulin aspart TID with meals, and 30 units inuslin glargine-yfgn daily).     NUTRITION - FOCUSED PHYSICAL EXAM:  Flowsheet Row Most Recent Value  Orbital Region No depletion  Upper Arm Region No depletion  Thoracic and Lumbar Region No depletion  Buccal Region No depletion  Temple Region No depletion  Clavicle Bone Region No depletion  Clavicle and Acromion Bone Region No depletion  Scapular Bone Region No depletion  Dorsal Hand No depletion  Patellar Region No depletion  Anterior Thigh Region No depletion  Posterior Calf Region No depletion  Edema (RD Assessment) Mild  Hair Reviewed  Eyes Reviewed  Mouth Reviewed  Skin Reviewed  Nails Reviewed       Diet Order:   Diet Order             Diet Carb Modified Fluid consistency: Thin; Room service appropriate? Yes  Diet effective now                   EDUCATION NEEDS:   Not appropriate for education at this time  Skin:  Skin Assessment: Reviewed RN Assessment  Last BM:  03/09/21  Height:   Ht Readings from Last 1 Encounters:  10/30/20 5\' 1"  (1.549 m)    Weight:   Wt Readings from Last 1 Encounters:  03/09/21 71.7 kg   BMI:  Body mass index is 29.87 kg/m.  Estimated Nutritional Needs:   Kcal:  1800-2000  Protein:  90-100 grams  Fluid:  >1.8L/d    03/11/21, RD, LDN, CDCES Registered Dietitian II Certified Diabetes Care and Education Specialist Please refer to [facility] for RD and/or RD on-call/weekend/after  hours pager

## 2021-03-10 NOTE — Progress Notes (Signed)
Occupational Therapy Treatment Patient Details Name: [name] MRN: [id number] DOB: [birth year] Today's Date: 03/10/2021   History of present illness Pt is a 55 y/o female admitted for neurological evaluation of suspected encephalopathy.  However, brain imaging was normal, EEG unremarkable, UA negative, urine drug screen negative, procalcitonin low, B12, ammonia, and TSH normal.  Acute metabolic encephalopathy was ruled out. Progressive mental decline over previous 2 days so another EEG and CT head performed 8/30 with normal results, no sign of seizures. Improving mental status 02/18/21. PMH: intellectual disability, bipolar disorder, seizure disorder, type II diabetes, hypertension, hyperlipidemia, immune deficiency disorder.   OT comments  Patient awake and almost presenting as manic this date.  Decreased command following and poor safety.  Patient wanting to walk in the halls after lower body ADL/stand grooming.  Patient pushing 4WRW at a fast steady pace, bumping into objects in the hall, and not wanting to follow commands for returning to her room.  Patient pulling against gait belt and needing to be calmed prior to returning to her bed.  CIR is being considered, but OT can continue to follow in the acute setting.   Recommendations for follow up therapy are one component of a multi-disciplinary discharge planning process, led by the attending physician.  Recommendations may be updated based on patient status, additional functional criteria and insurance authorization.    Follow Up Recommendations  CIR    Equipment Recommendations  3 in 1 bedside commode;Tub/shower seat    Recommendations for Other Services      Precautions / Restrictions Precautions Precautions: Fall Precaution Comments: PEG tube Restrictions Weight Bearing Restrictions: No Other Position/Activity Restrictions: Increased alertness and very interactive       Mobility Bed Mobility Overal bed mobility: Needs  Assistance Bed Mobility: Sit to Supine     Supine to sit: Supervision Sit to supine: Supervision   General bed mobility comments: Pt excited to get up with therapy - began attempting to sit up and scoot between bed rails that were up. She was able to slow down with cues and wait for me to get the railings down, purewick removed, etc. No assist physically to transition to EOB. Patient Response: Impulsive  Transfers Overall transfer level: Needs assistance Equipment used: 4-wheeled walker Transfers: Sit to/from Stand Sit to Stand: Supervision Stand pivot transfers: Supervision       General transfer comment: safety cueing    Balance Overall balance assessment: Needs assistance Sitting-balance support: Feet supported Sitting balance-Leahy Scale: Fair Sitting balance - Comments: no lob while sitting on eob Postural control: Posterior lean Standing balance support: Bilateral upper extremity supported Standing balance-Leahy Scale: Fair Standing balance comment: able to perform grooming activities using BUEs but pt needs tactile cues for leaning forward to reach when standing toward soap at sink - no LOB however once leaning/reaching ~3-5" outside BOS                           ADL either performed or assessed with clinical judgement   ADL Overall ADL's : Needs assistance/impaired     Grooming: Wash/dry hands;Wash/dry face;Standing;Supervision/safety               Lower Body Dressing: Supervision/safety;Cueing for sequencing;Sitting/lateral leans Lower Body Dressing Details (indicate cue type and reason): doffed and donned footwear Toilet Transfer: Supervision/safety;Regular Toilet;RW Toilet Transfer Details (indicate cue type and reason): decreased safety and cueing needing to [facility] RW in the bathroom  Functional mobility during ADLs: Supervision/safety;Rolling walker General ADL Comments: decreased safety noted                        Cognition Arousal/Alertness: Awake/alert Behavior During Therapy: Flat affect Overall Cognitive Status: History of cognitive impairments - at baseline Area of Impairment: Orientation;Attention;Memory;Following commands;Safety/judgement;Awareness;Problem solving                 Orientation Level: Disoriented to;Place;Situation (Oriented to self and year. Knows her sister, [name] who was present at beginning of session.) Current Attention Level: Focused Memory: Decreased recall of precautions;Decreased short-term memory Following Commands: Follows one step commands consistently;Follows multi-step commands inconsistently;Follows one step commands with increased time Safety/Judgement: Decreased awareness of deficits;Decreased awareness of safety Awareness: Intellectual Problem Solving: Slow processing;Requires verbal cues;Requires tactile cues;Decreased initiation;Difficulty sequencing General Comments: probably at her baseline for cognition and safety.        Exercises Exercises: Other exercises Other Exercises Other Exercises: standing BLE AROM: hip flexion x10 reps Other Exercises: STS x3 reps for strengthening   Shoulder Instructions       General Comments      Pertinent Vitals/ Pain       Pain Assessment: No/denies pain Faces Pain Scale: No hurt Pain Intervention(s): Monitored during session  Home Living     Available Help at Discharge: Family;Friend(s);Available 24 hours/day Type of Home: Apartment                              Lives With: Other (Comment) (sister)    Prior Functioning/Environment              Frequency           Progress Toward Goals  OT Goals(current goals can now be found in the care plan section)     Acute Rehab OT Goals Patient Stated Goal: Not stated OT Goal Formulation: With patient Time For Goal Achievement: 03/15/21 Potential to Achieve Goals: Good ADL Goals Pt Will Perform Lower Body Bathing: with  set-up;sit to/from stand Pt Will Perform Lower Body Dressing: with set-up;sit to/from stand Pt Will Transfer to Toilet: with modified independence;regular height toilet;ambulating Pt Will Perform Toileting - Clothing Manipulation and hygiene: with set-up;sit to/from stand;sitting/lateral leans Additional ADL Goal #1: Grooming task standing at sink with setup and no VC's for sequencing.  Plan Discharge plan remains appropriate    Co-evaluation                 AM-PAC OT "6 Clicks" Daily Activity     Outcome Measure   Help from another person eating meals?: None Help from another person taking care of personal grooming?: A Little Help from another person toileting, which includes using toliet, bedpan, or urinal?: A Little Help from another person bathing (including washing, rinsing, drying)?: A Little Help from another person to put on and taking off regular upper body clothing?: A Little Help from another person to put on and taking off regular lower body clothing?: A Little 6 Click Score: 19    End of Session Equipment Utilized During Treatment: Gait belt;Rolling walker  OT Visit Diagnosis: Other symptoms and signs involving cognitive function;Muscle weakness (generalized) (M62.81)   Activity Tolerance Patient tolerated treatment well   Patient Left in bed;with call bell/phone within reach;with bed alarm set;with nursing/sitter in room   Nurse Communication Mobility status        Time: 2440-1027 OT Time Calculation (min): 14 min  Charges: OT General Charges $OT Visit: 1 Visit OT Treatments $Self Care/Home Management : 8-22 mins  03/10/2021  RP, OTR/L  Acute Rehabilitation Services  Office:  [phone]   Suzanna Obey 03/10/2021, 3:09 PM

## 2021-03-10 NOTE — Plan of Care (Signed)

## 2021-03-10 NOTE — Evaluation (Addendum)
Speech Language Pathology Evaluation Patient Details Name: [name] MRN: [id number] DOB: 09/04/1965 Today's Date: 03/10/2021 Time: 1035-1100 SLP Time Calculation (min) (ACUTE ONLY): 25 min  Problem List:  Patient Active Problem List   Diagnosis Date Noted   Psychogenic loss of appetite    Intellectual delay    Poor appetite 01/26/2021   AMS (altered mental status) 01/25/2021   Constipation 01/24/2021   Bipolar disorder (HCC) 01/24/2021   Diabetes (HCC) 01/24/2021   Observed seizure-like activity (HCC) 10/31/2020   Seizure (HCC) 10/31/2020   Hypoglycemia 10/31/2020   Elevated LFTs 10/31/2020   Acute encephalopathy 10/30/2020   Change in mental status 10/29/2020   Intellectual disability    Confusion    Past Medical History:  Past Medical History:  Diagnosis Date   Diabetes mellitus without complication (HCC)    Hypertension    Immune deficiency disorder (HCC)    Mild mental retardation    Past Surgical History:  Past Surgical History:  Procedure Laterality Date   IR GASTROSTOMY TUBE MOD SED  02/14/2021   HPI:  55 y.o. female with medical history significant of intellectual disability, bipolar disorder, seizure disorder, type II diabetes, hypertension, hyperlipidemia, immune deficiency disorder presenting to the ED on 01/23/21 for evaluation of altered mental status.  Patient's sister reported that she has not been eating for the past few days and has been saying things which are bizarre.  Reported slurred speech since yesterday.  Also reporting abdominal pain and no bowel movement in 2 weeks despite being given bisacodyl.  No witnessed seizure.  In the ED, slightly tachycardic on arrival, heart rate subsequently improved.  No fever.  Labs showing WBC 9.2, hemoglobin 13.7, platelet count 442.  Sodium 138, potassium 3.5, chloride 102, bicarb 23, BUN 11, creatinine 0.9, glucose 81.  Lipase and LFTs normal.  UA pending.  Head CT negative for acute finding.  MRI on 01/24/21  indicated No acute intracranial pathology or epileptogenic focus identified.  Chest x-ray showing no active disease. ST evaluated for BSE and s/o previously this hospitalization; SLE ordered to assess current mentation/speech.   Assessment / Plan / Recommendation Clinical Impression  Pt seen for speech/language evaluation with overall delay in processing of information with increased processing time/gestures and mod multimodal cues provided for increased processing of simple information.  Pt exhibited slow speech pattern in conjunction with delayed processing (mildly dysarthric, 75% intelligible) with anomia present as convergent/divergent and confrontational naming tasks all impacted during simple naming tasks.  Repetition of words-phrases with 80% accuracy, but this decreased to 30% accuracy with longer sentences.  Pt oriented x3 with min cues provided for sentence completion (ie: "the year is".Marland Kitchen); pt able to provide place with F:2 choices during orientation task.  Sister stated pt's "thinking and speaking" have been impacted overall and would appreciate ST f/u while in acute setting for recent changes in baseline with potential for OP SLP f/u prn at home.  ST will f/u briefly while in acute setting for cog/linguistic and speech changes.  Thank you for this consult.    SLP Assessment  SLP Recommendation/Assessment: Patient needs continued Speech Language Pathology Services SLP Visit Diagnosis: Dysarthria and anarthria (R47.1);Cognitive communication deficit (R41.841)    Recommendations for follow up therapy are one component of a multi-disciplinary discharge planning process, led by the attending physician.  Recommendations may be updated based on patient status, additional functional criteria and insurance authorization.    Follow Up Recommendations  Outpatient SLP    Frequency and Duration min 1  x/week  1 week      SLP Evaluation Cognition  Overall Cognitive Status: Impaired/Different from  baseline Arousal/Alertness: Awake/alert Orientation Level: Oriented to person;Oriented to situation;Disoriented to place;Oriented to time (needed some cueing from SLP for word retrieval) Year: 2022 Month: September Day of Week: Incorrect Attention: Sustained Focused Attention: Appears intact Sustained Attention: Impaired Sustained Attention Impairment: Verbal basic;Functional basic Memory: Impaired Memory Impairment: Retrieval deficit;Decreased short term memory Decreased Short Term Memory: Verbal basic;Functional basic Awareness: Impaired Behaviors: Perseveration       Comprehension  Auditory Comprehension Overall Auditory Comprehension: Impaired at baseline Yes/No Questions: Within Functional Limits Commands: Impaired Two Step Basic Commands: 50-74% accurate Conversation: Simple Interfering Components: Processing speed;Working memory EffectiveTechniques: Repetition;Extra processing time;Visual/Gestural cues Visual Recognition/Discrimination Discrimination: Not tested Reading Comprehension Reading Status: Not tested    Expression Expression Primary Mode of Expression: Verbal Verbal Expression Overall Verbal Expression: Impaired Level of Generative/Spontaneous Verbalization: Sentence Repetition: Impaired Level of Impairment: Sentence level Naming: Impairment Responsive: 76-100% accurate Confrontation: Within functional limits Convergent: 25-49% accurate Divergent: 25-49% accurate Non-Verbal Means of Communication: Not applicable Other Verbal Expression Comments: Pt's processing of information/anomia impacting overall communication Written Expression Dominant Hand: Left Written Expression: Not tested   Oral / Motor  Oral Motor/Sensory Function Overall Oral Motor/Sensory Function: Within functional limits Motor Speech Overall Motor Speech: Appears within functional limits for tasks assessed Respiration: Within functional limits Phonation: Normal Resonance: Within  functional limits Intelligibility: Intelligibility reduced (min dysarthria noted within speech/dysfluencies) Word: 50-74% accurate Phrase: 50-74% accurate Sentence: 50-74% accurate Conversation: 25-49% accurate Motor Planning: Witnin functional limits Motor Speech Errors: Not applicable                       Tressie Stalker, M.S., CCC-SLP 03/10/2021, 12:39 PM

## 2021-03-10 NOTE — Progress Notes (Signed)
[facility] PROGRESS NOTE  [name] STM:[id number] DOB: September 25, 1965 DOA: 01/23/2021 PCP: Marissa Mires, MD  Status: Remains inpatient appropriate because:Altered mental status, Unsafe d/c plan, IV treatments appropriate due to intensity of illness or inability to take PO, and Inpatient level of care appropriate due to severity of illness  Dispo: The patient is from: Home              Anticipated d/c is to: SNF: Unfortunately continues to have waxing and waning mental status and continues to require 24/7 care.  Sister requested SNF placement since she is unable to provide this care at home.              Patient currently is not medically stable to d/c.Marland Kitchen  Now that she has awakened need to ensure that patient can mobilize appropriately and navigate stairs before discharge home.   Difficult to place patient Yes   Level of care: Med-Surg  Code Status: Full Family Communication: Sister [name] 9/21-made aware that recent altered mentation related to reemergence of catatonia which is resolving with the addition of benzodiazepine. DVT prophylaxis: Lovenox COVID vaccination status: Unknown   HPI: 55 y.o. F with DM, HTN, developmental delay who presented with decreased oral intake and "saying bizarre things".   In the ER, CT head unremarkable.  She was hypoglycemic and otherwise had normal chemistries and hgb  Admitted for evaluation of erratic behavior.  Subjective: Patient alert and smiling.  Laughing at jokes.  Objective: Vitals:   03/10/21 0018 03/10/21 0339  BP: 111/71 110/76  Pulse: (!) 105 (!) 109  Resp: 17 16  Temp: (!) 97.4 F (36.3 C) 98 F (36.7 C)  SpO2: 99% 99%    Intake/Output Summary (Last 24 hours) at 03/10/2021 0748 Last data filed at 03/10/2021 0700 Gross per 24 hour  Intake 800 ml  Output 2900 ml  Net -2100 ml   Filed Weights   02/20/21 0432 02/22/21 0651 03/09/21 0612  Weight: 73.5 kg 71.6 kg 71.7 kg    Exam: Constitutional: NAD, calm, alert and  very interactive today Respiratory: Bilateral lung sounds are clear to auscultation, stable on room air, no increased work of breathing. Cardiac: S1-S2 on auscultation, pulse remains regular, skin warm and dry with excellent capillary refill, normotensive Abdomen: PEG tube site unremarkable and currently not used except for flushes LBM 9/21 abdomen soft and nontender  Neurologic: Cranial nerves II through XII are intact.  Patient moving all extremities equally with strength 4-5/5.  Sensation intact. Psychiatric: Awake and and alert, interactive in conversation and smiling.  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 Because of lack of improvement/extremely poor oral intake PEG tube was placed on 8/29.   As of 9/21 patient's mentation has improved considerably after resuming Ativan 0.5 mg daily.  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    Physical deconditioning  PT/OT aware of discharge plan to home.  Focus will be on stair navigation and increasing mobility Patient now significantly improved with mentation will  be able to work more appropriately with therapy We will also place order to walk patient in halls so nursing staff can assist with mobilization   Diabetes mellitus 2 uncontrolled on long-term insulin Continue Semglee to 25 units daily and 7 units meal  coverage TID AC Prior to admission patient was on Lantus 40 units HS (we are using Semglee ) Continue metformin and Amaryl  Continue SSI  Hypokalemia Hypomagnesemia Resolved         Data Reviewed: Basic Metabolic Panel: Recent Labs  Lab 03/05/21 0153 03/06/21 0224  NA 136 136  K 4.0 4.3  CL 102 101  CO2 26 26  GLUCOSE 108* 128*  BUN 9 10  CREATININE 0.70 0.63  CALCIUM 9.7 9.5   Liver Function Tests: Recent Labs  Lab 03/06/21 0224  AST 44*  ALT 48*  ALKPHOS 73  BILITOT 0.4  PROT 6.7  ALBUMIN 3.1*   No results for input(s): LIPASE, AMYLASE in the last 168 hours. No results for input(s): AMMONIA in the last 168 hours. CBC: Recent Labs  Lab 03/05/21 0153 03/06/21 0224  WBC 10.0 9.8  HGB 12.6 12.9  HCT 39.8 40.2  MCV 91.7 91.4  PLT 361 352     CBG: Recent Labs  Lab 03/09/21 0635 03/09/21 1206 03/09/21 1553 03/09/21 2102 03/10/21 0619  GLUCAP 170* 272* 215* 271* 137*    Scheduled Meds:  benztropine  0.5 mg Oral Daily   enoxaparin (LOVENOX) injection  40 mg Subcutaneous Q24H   feeding supplement (GLUCERNA SHAKE)  237 mL Oral TID BM   free water  100 mL Per Tube Q8H   glimepiride  2 mg Oral Q breakfast   hydrocerin   Topical BID   insulin aspart  0-15 Units Subcutaneous TID WC   insulin aspart  0-5 Units Subcutaneous QHS   insulin aspart  8 Units Subcutaneous TID WC   insulin glargine-yfgn  30 Units Subcutaneous Daily   levETIRAcetam  1,000 mg Oral BID   loratadine  10 mg Oral Daily   LORazepam  0.5 mg Oral QAC breakfast   metFORMIN  500 mg Oral BID WC   multivitamin with minerals  1 tablet Oral Daily   risperiDONE  2 mg Oral QHS   valACYclovir  500 mg Oral QHS      Principal Problem:   Acute encephalopathy Active Problems:   Hypoglycemia   Constipation   Bipolar disorder (HCC)   Diabetes (HCC)   AMS (altered mental status)   Poor appetite   Psychogenic loss of appetite   Intellectual  delay   Consultants: Neurology Psychiatry  Procedures: EEG Long-term EEG  Antibiotics: Valacyclovir 8/15 through 8/28   Time spent: 15 minutes    Junious Silk ANP  [facility] 7 am - 330 pm/M-F for direct patient care and secure chat Please refer to Amion for contact info 44  days

## 2021-03-10 NOTE — Progress Notes (Signed)
Inpatient Diabetes Program Recommendations  AACE/ADA: New Consensus Statement on Inpatient Glycemic Control (2015)  Target Ranges:  Prepandial:   less than 140 mg/dL      Peak postprandial:   less than 180 mg/dL (1-2 hours)      Critically ill patients:  140 - 180 mg/dL   Lab Results  Component Value Date   GLUCAP 137 (H) 03/10/2021   HGBA1C 6.4 (H) 01/23/2021    Review of Glycemic Control Results for [name], [name] (MRN [id number]) as of 03/10/2021 09:39  Ref. Range 03/09/2021 12:06 03/09/2021 15:53 03/09/2021 21:02 03/10/2021 06:19  Glucose-Capillary Latest Ref Range: 70 - 99 mg/dL 629 (H) 528 (H) 413 (H) 137 (H)   Home DM Meds: Amaryl 2 mg daily                             Novolog 1-7 units TID per SSI                             Lantus 40 units QHS                             Metformin 500 mg BID     Current Orders: Semglee 30 units Daily                            Novolog Moderate Correction Scale/ SSI (0-15 units) TID AC + HS                            Novolog 8 units TID with meals                             Metformin 500 mg BID                             Amaryl 2 mg QD       Consider the following in-hospital insulin adjustments:   Increase Semglee to 35 units Daily and increase Novolog to 12 units TID (Assuming patient is consuming >50% of meals).  Thanks, Lujean Rave, MSN, RNC-OB Diabetes Coordinator [phone] (8a-5p)

## 2021-03-11 DIAGNOSIS — G9340 Encephalopathy, unspecified: Secondary | ICD-10-CM | POA: Diagnosis not present

## 2021-03-11 LAB — GLUCOSE, CAPILLARY
Glucose-Capillary: 101 mg/dL — ABNORMAL HIGH (ref 70–99)
Glucose-Capillary: 149 mg/dL — ABNORMAL HIGH (ref 70–99)
Glucose-Capillary: 223 mg/dL — ABNORMAL HIGH (ref 70–99)
Glucose-Capillary: 160 mg/dL — ABNORMAL HIGH (ref 70–99)

## 2021-03-11 NOTE — Progress Notes (Signed)
[facility] PROGRESS NOTE  [name] ERD:[id number] DOB: [birth year] DOA: 01/23/2021 PCP: Mirna Mires, MD  Status: Remains inpatient appropriate because:Altered mental status, Unsafe d/c plan, IV treatments appropriate due to intensity of illness or inability to take PO, and Inpatient level of care appropriate due to severity of illness  Dispo: The patient is from: Home              Anticipated d/c is to: CIR: Last week patient had a relapse with her catatonia but has improved with resumption of Ativan.  PT/OT now recommending CIR              Patient currently is not medically stable to d/c.Marland Kitchen  Now that she has awakened need to ensure that patient can mobilize appropriately and navigate stairs before discharge home.   Difficult to place patient Yes   Level of care: Med-Surg  Code Status: Full Family Communication: Sister [name] 9/21-made aware that recent altered mentation related to reemergence of catatonia which is resolving with the addition of benzodiazepine. DVT prophylaxis: Lovenox COVID vaccination status: Unknown   HPI: 55 y.o. F with DM, HTN, developmental delay who presented with decreased oral intake and "saying bizarre things".   In the ER, CT head unremarkable.  She was hypoglycemic and otherwise had normal chemistries and hgb  Admitted for evaluation of erratic behavior.  Subjective: Alert.  Very talkative today.  Objective: Vitals:   03/11/21 0017 03/11/21 0445  BP: 104/78 111/78  Pulse: (!) 105 99  Resp: 15 16  Temp: 97.9 F (36.6 C) 98 F (36.7 C)  SpO2: 99% 99%    Intake/Output Summary (Last 24 hours) at 03/11/2021 0742 Last data filed at 03/11/2021 0445 Gross per 24 hour  Intake 1157 ml  Output 2105 ml  Net -948 ml   Filed Weights   02/20/21 0432 02/22/21 0651 03/09/21 0612  Weight: 73.5 kg 71.6 kg 71.7 kg    Exam: Constitutional: NAD, calm, alert  Respiratory: Lung sounds are clear to auscultation, normal respiratory pattern, room  air Cardiac: Normal heart sounds S1-S2, skin warm and dry, normotensive Abdomen: PEG tube site unremarkable and currently not used except for flushes LBM 9/21 abdomen soft and nontender  Neurologic: Cranial nerves II through XII are intact.  Patient moving all extremities equally with strength 4-5/5.  Sensation intact. Psychiatric: Awake and and alert, interactive in conversation and smiling.  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   Continue preadmission Risperdal  Poor oral intake 2/2 fixed delusions/Malnutrition/Obesity Resolved PEG tube placed on 8/29 -can remove in 4 to 6 weeks after discharge   Seizure disorder POA Continue keppra Recent EEG unremarkable.    Physical deconditioning  PT/OT aware of discharge plan to home.  Focus will be on stair navigation and increasing mobility Patient now significantly improved with mentation will be able to work more appropriately with therapy We will also place order to walk patient in halls so nursing staff can assist with mobilization   Diabetes mellitus 2 uncontrolled on long-term insulin Continue Semglee and SSI Prior to admission patient was on Lantus 40 units HS (we are using Semglee ) Continue metformin and Amaryl   Hypokalemia Hypomagnesemia Resolved         Data Reviewed:  Basic Metabolic Panel: Recent Labs  Lab 03/05/21 0153 03/06/21 0224 03/10/21 0826  NA 136 136 134*  K 4.0 4.3 4.0  CL 102 101 101  CO2 26 26 25   GLUCOSE 108* 128* 247*  BUN 9 10 8   CREATININE 0.70 0.63 0.72  CALCIUM 9.7 9.5 9.4   Liver Function Tests: Recent Labs  Lab 03/06/21 0224  03/10/21 0826  AST 44* 43*  ALT 48* 38  ALKPHOS 73 73  BILITOT 0.4 0.6  PROT 6.7 6.7  ALBUMIN 3.1* 3.0*   No results for input(s): LIPASE, AMYLASE in the last 168 hours. No results for input(s): AMMONIA in the last 168 hours. CBC: Recent Labs  Lab 03/05/21 0153 03/06/21 0224  WBC 10.0 9.8  HGB 12.6 12.9  HCT 39.8 40.2  MCV 91.7 91.4  PLT 361 352     CBG: Recent Labs  Lab 03/10/21 0619 03/10/21 1137 03/10/21 1546 03/10/21 2130 03/11/21 0619  GLUCAP 137* 132* 174* 108* 101*    Scheduled Meds:  enoxaparin (LOVENOX) injection  40 mg Subcutaneous Q24H   feeding supplement (GLUCERNA SHAKE)  237 mL Oral TID BM   free water  100 mL Per Tube Q8H   glimepiride  2 mg Oral Q breakfast   hydrocerin   Topical BID   insulin aspart  0-15 Units Subcutaneous TID WC   insulin aspart  0-5 Units Subcutaneous QHS   insulin aspart  8 Units Subcutaneous TID WC   insulin glargine-yfgn  30 Units Subcutaneous Daily   levETIRAcetam  1,000 mg Oral BID   loratadine  10 mg Oral Daily   LORazepam  0.5 mg Oral QAC breakfast   metFORMIN  500 mg Oral BID WC   multivitamin with minerals  1 tablet Oral Daily   risperiDONE  2 mg Oral QHS   valACYclovir  500 mg Oral QHS      Principal Problem:   Acute encephalopathy Active Problems:   Hypoglycemia   Constipation   Bipolar disorder (HCC)   Diabetes (HCC)   AMS (altered mental status)   Poor appetite   Psychogenic loss of appetite   Intellectual delay   Consultants: Neurology Psychiatry  Procedures: EEG Long-term EEG  Antibiotics: Valacyclovir 8/15 through 8/28   Time spent: 15 minutes    9/15 ANP  [facility] 7 am - 330 pm/M-F for direct patient care and secure chat Please refer to Amion for contact info 45  days

## 2021-03-11 NOTE — Plan of Care (Signed)
  Problem: Education: Goal: Knowledge of General Education information will improve Description: Including pain rating scale, medication(s)/side effects and non-pharmacologic comfort measures Outcome: Progressing   Problem: Clinical Measurements: Goal: Ability to maintain clinical measurements within normal limits will improve Outcome: Progressing Goal: Will remain free from infection Outcome: Progressing Goal: Diagnostic test results will improve Outcome: Progressing Goal: Respiratory complications will improve Outcome: Progressing Goal: Cardiovascular complication will be avoided Outcome: Progressing   Problem: Safety: Goal: Verbalization of understanding the information provided will improve Outcome: Progressing   

## 2021-03-12 DIAGNOSIS — G9340 Encephalopathy, unspecified: Secondary | ICD-10-CM | POA: Diagnosis not present

## 2021-03-12 LAB — GLUCOSE, CAPILLARY
Glucose-Capillary: 142 mg/dL — ABNORMAL HIGH (ref 70–99)
Glucose-Capillary: 189 mg/dL — ABNORMAL HIGH (ref 70–99)
Glucose-Capillary: 162 mg/dL — ABNORMAL HIGH (ref 70–99)
Glucose-Capillary: 113 mg/dL — ABNORMAL HIGH (ref 70–99)

## 2021-03-12 LAB — BASIC METABOLIC PANEL
Anion gap: 8 (ref 5–15)
BUN: 12 mg/dL (ref 6–20)
CO2: 27 mmol/L (ref 22–32)
Calcium: 9.8 mg/dL (ref 8.9–10.3)
Chloride: 100 mmol/L (ref 98–111)
Creatinine, Ser: 0.79 mg/dL (ref 0.44–1.00)
GFR, Estimated: >60 mL/min (ref >60)
Glucose, Bld: 220 mg/dL — ABNORMAL HIGH (ref 70–99)
Potassium: 4.1 mmol/L (ref 3.5–5.1)
Sodium: 135 mmol/L (ref 135–145)

## 2021-03-12 NOTE — Progress Notes (Signed)
IV team placed IV but pt pulled it, pt does not have IV meds at the moment, may need mittens but would agitate her, will put STAT to IV team in case IV will be needed.

## 2021-03-12 NOTE — Plan of Care (Signed)
  Problem: Education: Goal: Knowledge of General Education information will improve Description: Including pain rating scale, medication(s)/side effects and non-pharmacologic comfort measures Outcome: Progressing   Problem: Health Behavior/Discharge Planning: Goal: Ability to manage health-related needs will improve Outcome: Progressing   Problem: Clinical Measurements: Goal: Ability to maintain clinical measurements within normal limits will improve Outcome: Progressing Goal: Will remain free from infection Outcome: Progressing Goal: Diagnostic test results will improve Outcome: Progressing Goal: Respiratory complications will improve Outcome: Progressing Goal: Cardiovascular complication will be avoided Outcome: Progressing   Problem: Activity: Goal: Risk for activity intolerance will decrease Outcome: Progressing   Problem: Nutrition: Goal: Adequate nutrition will be maintained Outcome: Progressing   Problem: Pain Managment: Goal: General experience of comfort will improve Outcome: Progressing   Problem: Safety: Goal: Ability to remain free from injury will improve Outcome: Progressing   Problem: Safety: Goal: Non-violent Restraint(s) Outcome: Progressing   Problem: Education: Goal: Expressions of having a comfortable level of knowledge regarding the disease process will increase Outcome: Progressing   Problem: Clinical Measurements: Goal: Complications related to the disease process, condition or treatment will be avoided or minimized Outcome: Progressing Goal: Diagnostic test results will improve Outcome: Progressing   Problem: Safety: Goal: Verbalization of understanding the information provided will improve Outcome: Progressing

## 2021-03-12 NOTE — Progress Notes (Addendum)
PROGRESS NOTE    KENLEY RETTINGER  SHF:[id number] DOB: 05-Jun-1966 DOA: 01/23/2021 PCP: Mirna Mires, MD   Brief Narrative: 55 year old with past medical history significant for DM, HTN, developmental delay who presented with decreased oral intake and "saying  bizarre things" In the ER, CT head unremarkable.  She was hypoglycemic and otherwise had normal chemistry and hemoglobin.  Admitted for evaluation of erratic behavior.  Assessment & Plan:   Principal Problem:   Acute encephalopathy Active Problems:   Hypoglycemia   Constipation   Bipolar disorder (HCC)   Diabetes (HCC)   AMS (altered mental status)   Poor appetite   Psychogenic loss of appetite   Intellectual delay  1-intellectual, developmental delay with fixed delusions, suspected catatonia: Patient admitted with encephalopathy symptoms and behavior.  Work-up unremarkable. Psych consulted, diagnosed with catatonia that resolved with Ativan. Because of lack of improvement, extremely poor oral intake PEG tube was placed 8/29. Continue With Risperdal and Ativan.  Poor oral intake, secondary to fixed delusions, malnutrition, Peg Tube placed 8/29  Seizure disorder POA: Continue with Keppra EEG unremarkable  Physical deconditioning: Patient mental status has improved, she will need to work with PT and OT.   Diabetes type 2 uncontrolled: Continue with Semglee and sliding scale  Hypokalemia, hypomagnesemia resolved  Suspect will need SNF    Nutrition Problem: Inadequate oral intake Etiology: lethargy/confusion    Signs/Symptoms: meal completion < 25%, per patient/family report    Interventions: Glucerna shake, MVI  Estimated body mass index is 29.87 kg/m as calculated from the following:   Height as of 10/30/20: 5\' 1"  (1.549 m).   Weight as of this encounter: 71.7 kg.   DVT prophylaxis: Lovenox Code Status: Full Code.  Disposition: Difficult to place.      Consultants:   Neurology Psychiatry  Procedures:  EEG Long-term EEG      Subjective: She is alert, recognize friend on Photos. Denies pain.   Objective: Vitals:   03/11/21 1644 03/11/21 2006 03/11/21 2347 03/12/21 0350  BP: 118/64 131/79 113/74 116/85  Pulse: (!) 106 (!) 108 100 (!) 109  Resp: 16 19 17 17   Temp: 98.6 F (37 C) (!) 97.5 F (36.4 C) 98.6 F (37 C) 99.7 F (37.6 C)  TempSrc: Oral Oral Oral Oral  SpO2: 100% 98% 100% 99%  Weight:        Intake/Output Summary (Last 24 hours) at 03/12/2021 0720 Last data filed at 03/11/2021 2348 Gross per 24 hour  Intake --  Output 1850 ml  Net -1850 ml   Filed Weights   02/20/21 0432 02/22/21 0651 03/09/21 0612  Weight: 73.5 kg 71.6 kg 71.7 kg    Examination:  General exam: Appears calm and comfortable  Respiratory system: Clear to auscultation. Respiratory effort normal. Cardiovascular system: S1 & S2 heard, RRR.  Gastrointestinal system: Abdomen is nondistended, soft and nontender. No organomegaly or masses felt. Normal bowel sounds heard. Central nervous system: Alert follows command Extremities: Symmetric 5 x 5 power.    Data Reviewed: I have personally reviewed following labs and imaging studies  CBC: Recent Labs  Lab 03/06/21 0224  WBC 9.8  HGB 12.9  HCT 40.2  MCV 91.4  PLT 352   Basic Metabolic Panel: Recent Labs  Lab 03/06/21 0224 03/10/21 0826  NA 136 134*  K 4.3 4.0  CL 101 101  CO2 26 25  GLUCOSE 128* 247*  BUN 10 8  CREATININE 0.63 0.72  CALCIUM 9.5 9.4   GFR: Estimated Creatinine Clearance: 71.2 mL/min (  by C-G formula based on SCr of 0.72 mg/dL). Liver Function Tests: Recent Labs  Lab 03/06/21 0224 03/10/21 0826  AST 44* 43*  ALT 48* 38  ALKPHOS 73 73  BILITOT 0.4 0.6  PROT 6.7 6.7  ALBUMIN 3.1* 3.0*   No results for input(s): LIPASE, AMYLASE in the last 168 hours. No results for input(s): AMMONIA in the last 168 hours. Coagulation Profile: No results for input(s): INR, PROTIME in  the last 168 hours. Cardiac Enzymes: No results for input(s): CKTOTAL, CKMB, CKMBINDEX, TROPONINI in the last 168 hours. BNP (last 3 results) No results for input(s): PROBNP in the last 8760 hours. HbA1C: No results for input(s): HGBA1C in the last 72 hours. CBG: Recent Labs  Lab 03/11/21 0619 03/11/21 1129 03/11/21 1658 03/11/21 2105 03/12/21 0629  GLUCAP 101* 149* 223* 160* 142*   Lipid Profile: No results for input(s): CHOL, HDL, LDLCALC, TRIG, CHOLHDL, LDLDIRECT in the last 72 hours. Thyroid Function Tests: No results for input(s): TSH, T4TOTAL, FREET4, T3FREE, THYROIDAB in the last 72 hours. Anemia Panel: No results for input(s): VITAMINB12, FOLATE, FERRITIN, TIBC, IRON, RETICCTPCT in the last 72 hours. Sepsis Labs: No results for input(s): PROCALCITON, LATICACIDVEN in the last 168 hours.  No results found for this or any previous visit (from the past 240 hour(s)).       Radiology Studies: No results found.      Scheduled Meds:  enoxaparin (LOVENOX) injection  40 mg Subcutaneous Q24H   feeding supplement (GLUCERNA SHAKE)  237 mL Oral TID BM   free water  100 mL Per Tube Q8H   glimepiride  2 mg Oral Q breakfast   hydrocerin   Topical BID   insulin aspart  0-15 Units Subcutaneous TID WC   insulin aspart  0-5 Units Subcutaneous QHS   insulin aspart  8 Units Subcutaneous TID WC   insulin glargine-yfgn  30 Units Subcutaneous Daily   levETIRAcetam  1,000 mg Oral BID   loratadine  10 mg Oral Daily   LORazepam  0.5 mg Oral QAC breakfast   metFORMIN  500 mg Oral BID WC   multivitamin with minerals  1 tablet Oral Daily   risperiDONE  2 mg Oral QHS   valACYclovir  500 mg Oral QHS   Continuous Infusions:   LOS: 46 days    Time spent: 35 minutes.     Alba Cory, MD [facility]   If 7PM-7AM, please contact night-coverage [email or web address]  03/12/2021, 7:20 AM

## 2021-03-13 DIAGNOSIS — G9340 Encephalopathy, unspecified: Secondary | ICD-10-CM | POA: Diagnosis not present

## 2021-03-13 LAB — GLUCOSE, CAPILLARY
Glucose-Capillary: 145 mg/dL — ABNORMAL HIGH (ref 70–99)
Glucose-Capillary: 170 mg/dL — ABNORMAL HIGH (ref 70–99)
Glucose-Capillary: 139 mg/dL — ABNORMAL HIGH (ref 70–99)
Glucose-Capillary: 173 mg/dL — ABNORMAL HIGH (ref 70–99)

## 2021-03-13 MED ORDER — METOPROLOL SUCCINATE ER 25 MG PO TB24
12.5000 mg | ORAL_TABLET | Freq: Every day | ORAL | Status: DC
  Administered 2021-03-14 – 2021-03-25 (×12): 12.5 mg via ORAL
  Filled 2021-03-13 (×12): qty 1

## 2021-03-13 NOTE — Progress Notes (Addendum)
PROGRESS NOTE    [name]  ZJQ:[id number] DOB: July 15, 1965 DOA: 01/23/2021 PCP: Mirna Mires, MD   Brief Narrative: 55 year old with past medical history significant for DM, HTN, developmental delay who presented with decreased oral intake and "saying  bizarre things" In the ER, CT head unremarkable.  She was hypoglycemic and otherwise had normal chemistry and hemoglobin.  Admitted for evaluation of erratic behavior.  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   Work-up unremarkable. Psych consulted, diagnosed with catatonia that resolved with Ativan. Because of lack of improvement, extremely poor oral intake PEG tube was placed 8/29. Continue With Risperdal and Ativan.  Poor oral intake, secondary to fixed delusions, malnutrition, Peg Tube placed 8/29  Seizure disorder POA: Continue with Keppra. EEG unremarkable  Physical deconditioning: Patient mental status has improved, she will need to work with PT and OT. Plan for CIR admission. Awaiting bed.   Diabetes type 2 uncontrolled: Continue with Semglee and sliding scale  Hypokalemia, hypomagnesemia resolved Tachycardia:  She has had tachycardia on and off through hospitalization.  Electrolytes yesterday normal.  CBC has been stable.  Will start low dose metoprolol.  She has been on DVT prophylaxis. Denies chest pain.   Plan for CIR    Nutrition Problem: Inadequate oral intake Etiology: lethargy/confusion    Signs/Symptoms: meal completion < 25%, per patient/family report    Interventions: Glucerna shake, MVI  Estimated body mass index is 29.87 kg/m  as calculated from the following:   Height as of 10/30/20: 5\' 1"  (1.549 m).   Weight as of this encounter: 71.7 kg.   DVT prophylaxis: Lovenox Code Status: Full Code.  Disposition: CIR     Consultants:  Neurology Psychiatry  Procedures:  EEG Long-term EEG      Subjective: She is alert, no new complaints.  Objective: Vitals:   03/12/21 2021 03/13/21 0019 03/13/21 0355 03/13/21 0841  BP: 121/73 113/81 115/76 (!) 119/55  Pulse: (!) 110 (!) 110 (!) 107 (!) 103  Resp: 17 14 18 20   Temp: 98.3 F (36.8 C) 98.4 F (36.9 C) 98.1 F (36.7 C) 97.7 F (36.5 C)  TempSrc: Oral Oral Oral Oral  SpO2: 98% 97% 100% 99%  Weight:        Intake/Output Summary (Last 24 hours) at 03/13/2021 1101 Last data filed at 03/13/2021 03/15/2021 Gross per 24 hour  Intake 951 ml  Output 2850 ml  Net -1899 ml    Filed Weights   02/20/21 0432 02/22/21 0651 03/09/21 0612  Weight: 73.5 kg 71.6 kg 71.7 kg    Examination:  General exam: NAD Respiratory system: CTA Cardiovascular system: S 1, S 2 RRR Gastrointestinal system: BS present, oft, nt Central nervous system: alert, follows command Extremities: Symmetric power    Data Reviewed: I have personally reviewed following labs and imaging studies  CBC: No results for input(s): WBC, NEUTROABS, HGB, HCT, MCV, PLT in the last 168 hours.  Basic Metabolic Panel: Recent Labs  Lab 03/10/21 0826 03/12/21 0814  NA 134* 135  K 4.0 4.1  CL 101 100  CO2 25 27  GLUCOSE 247* 220*  BUN 8 12  CREATININE 0.72 0.79  CALCIUM 9.4  9.8    GFR: Estimated Creatinine Clearance: 71.2 mL/min (by C-G formula based on SCr of 0.79 mg/dL). Liver Function Tests: Recent Labs  Lab 03/10/21 0826  AST 43*  ALT 38  ALKPHOS 73  BILITOT 0.6  PROT 6.7  ALBUMIN 3.0*    No results for input(s): LIPASE, AMYLASE in the last 168 hours. No results for input(s): AMMONIA in the last 168 hours. Coagulation Profile: No results for input(s): INR, PROTIME in the  last 168 hours. Cardiac Enzymes: No results for input(s): CKTOTAL, CKMB, CKMBINDEX, TROPONINI in the last 168 hours. BNP (last 3 results) No results for input(s): PROBNP in the last 8760 hours. HbA1C: No results for input(s): HGBA1C in the last 72 hours. CBG: Recent Labs  Lab 03/12/21 0629 03/12/21 1147 03/12/21 1635 03/12/21 2108 03/13/21 0603  GLUCAP 142* 189* 162* 113* 145*    Lipid Profile: No results for input(s): CHOL, HDL, LDLCALC, TRIG, CHOLHDL, LDLDIRECT in the last 72 hours. Thyroid Function Tests: No results for input(s): TSH, T4TOTAL, FREET4, T3FREE, THYROIDAB in the last 72 hours. Anemia Panel: No results for input(s): VITAMINB12, FOLATE, FERRITIN, TIBC, IRON, RETICCTPCT in the last 72 hours. Sepsis Labs: No results for input(s): PROCALCITON, LATICACIDVEN in the last 168 hours.  No results found for this or any previous visit (from the past 240 hour(s)).       Radiology Studies: No results found.      Scheduled Meds:  enoxaparin (LOVENOX) injection  40 mg Subcutaneous Q24H   feeding supplement (GLUCERNA SHAKE)  237 mL Oral TID BM   free water  100 mL Per Tube Q8H   glimepiride  2 mg Oral Q breakfast   hydrocerin   Topical BID   insulin aspart  0-15 Units Subcutaneous TID WC   insulin aspart  0-5 Units Subcutaneous QHS   insulin aspart  8 Units Subcutaneous TID WC   insulin glargine-yfgn  30 Units Subcutaneous Daily   levETIRAcetam  1,000 mg Oral BID   loratadine  10 mg Oral Daily   LORazepam  0.5 mg Oral QAC breakfast   metFORMIN  500 mg Oral BID WC   multivitamin with minerals  1 tablet Oral Daily   risperiDONE  2 mg Oral QHS   valACYclovir  500 mg Oral QHS   Continuous Infusions:   LOS: 47 days    Time spent: 35 minutes.     Alba Cory, MD [facility]   If 7PM-7AM, please contact night-coverage [email or web address]  03/13/2021, 11:01 AM

## 2021-03-13 NOTE — Progress Notes (Signed)
Mews score yellow, MD aware, EKG ordered, patient not in distress, Mews escalated.

## 2021-03-14 DIAGNOSIS — G9340 Encephalopathy, unspecified: Secondary | ICD-10-CM | POA: Diagnosis not present

## 2021-03-14 LAB — CBC
HCT: 38.4 % (ref 36.0–46.0)
Hemoglobin: 12.4 g/dL (ref 12.0–15.0)
MCH: 29.7 pg (ref 26.0–34.0)
MCHC: 32.3 g/dL (ref 30.0–36.0)
MCV: 91.9 fL (ref 80.0–100.0)
Platelets: 361 10*3/uL (ref 150–400)
RBC: 4.18 MIL/uL (ref 3.87–5.11)
RDW: 13.2 % (ref 11.5–15.5)
WBC: 10.9 10*3/uL — ABNORMAL HIGH (ref 4.0–10.5)
nRBC: 0 % (ref 0.0–0.2)

## 2021-03-14 LAB — GLUCOSE, CAPILLARY
Glucose-Capillary: 163 mg/dL — ABNORMAL HIGH (ref 70–99)
Glucose-Capillary: 178 mg/dL — ABNORMAL HIGH (ref 70–99)
Glucose-Capillary: 173 mg/dL — ABNORMAL HIGH (ref 70–99)

## 2021-03-14 NOTE — Progress Notes (Signed)
SLP Cancellation Note  Patient Details Name: [name] MRN: [id number] DOB: 1966-01-22   Cancelled treatment:       Reason Eval/Treat Not Completed: Patient at procedure or test/unavailable. Attempted to provide treatment for cognitive linguistic deficits, however, pt is currently unavailable. Will continue efforts as schedule permits.  Daaiel Starlin B. Murvin Natal, [facility], CCC-SLP Speech Language Pathologist Office: [phone]  [name] 03/14/2021, 9:43 AM

## 2021-03-14 NOTE — Progress Notes (Signed)
Inpatient Rehabilitation Admissions Coordinator   Updated by Physical therapy of progress with her mobility. She is not in need of a CIR admit at this current functional level. Acute team and TOC made aware. We will sign off at this time.  Ottie Glazier, RN, MSN Rehab Admissions Coordinator [phone] 03/14/2021 10:55 AM

## 2021-03-14 NOTE — Progress Notes (Signed)
Occupational Therapy Treatment Patient Details Name: [name] MRN: [id number] DOB: 11/26/1965 Today's Date: 03/14/2021   History of present illness Pt is a 55 y/o female admitted for neurological evaluation of suspected encephalopathy.  However, brain imaging was normal, EEG unremarkable, UA negative, urine drug screen negative, procalcitonin low, B12, ammonia, and TSH normal.  Acute metabolic encephalopathy was ruled out. Progressive mental decline over previous 2 days so another EEG and CT head performed 8/30 with normal results, no sign of seizures. Improving mental status 02/18/21. PMH: intellectual disability, bipolar disorder, seizure disorder, type II diabetes, hypertension, hyperlipidemia, immune deficiency disorder.   OT comments  STAR OT session:  Patient performed bed mobility, LB dressing, self standing at sink, and transfers with RW.  Patient required frequent cues for safety, pacing self with RW and sequencing.  Patient would benefit from further OT under STAR program   Recommendations for follow up therapy are one component of a multi-disciplinary discharge planning process, led by the attending physician.  Recommendations may be updated based on patient status, additional functional criteria and insurance authorization.    Follow Up Recommendations  CIR    Equipment Recommendations  3 in 1 bedside commode;Tub/shower seat    Recommendations for Other Services      Precautions / Restrictions Precautions Precautions: Fall Precaution Comments: PEG tube Restrictions Other Position/Activity Restrictions: Increased alertness and very interactive       Mobility Bed Mobility Overal bed mobility: Needs Assistance Bed Mobility: Supine to Sit     Supine to sit: Supervision     General bed mobility comments: pt requires some limited assist to untangle feet from covers, pt with improved patience with waiting for removal of Purewick and putting bed rail down before getting  to EoB today    Transfers Overall transfer level: Needs assistance Equipment used: None;4-wheeled walker Transfers: Sit to/from Stand Sit to Stand: Supervision         General transfer comment: Multimodal cues to initiate power-up to full stand. No assist required from bed. Increased cuing for safe use of Rollator,especially in brake usage to insure safety    Balance Overall balance assessment: Needs assistance Sitting-balance support: Feet supported Sitting balance-Leahy Scale: Fair Sitting balance - Comments: slightly unsteady while donning socks in seated   Standing balance support: No upper extremity supported;During functional activity Standing balance-Leahy Scale: Fair Standing balance comment: able to perform grooming tasks at sink without UE support                           ADL either performed or assessed with clinical judgement   ADL Overall ADL's : Needs assistance/impaired     Grooming: Wash/dry hands;Wash/dry face;Oral care;Supervision/safety;Standing Grooming Details (indicate cue type and reason): vcs for sequencing Upper Body Bathing: Supervision/ safety;Min guard;Standing;Cueing for sequencing Upper Body Bathing Details (indicate cue type and reason): cues for sequecing Lower Body Bathing: Sitting/lateral leans;Sit to/from stand;Supervison/ safety Lower Body Bathing Details (indicate cue type and reason): bathed feed seated and perineal care standing     Lower Body Dressing: Supervision/safety;Cueing for sequencing;Sitting/lateral leans Lower Body Dressing Details (indicate cue type and reason): doffed and donned footwear             Functional mobility during ADLs: Supervision/safety;Rolling walker General ADL Comments: cues for safety and pacing self     Vision       Perception     Praxis      Cognition Arousal/Alertness: Awake/alert Behavior During  Therapy: Flat affect Overall Cognitive Status: Impaired/Different from  baseline Area of Impairment: Orientation;Attention;Memory;Following commands;Safety/judgement;Awareness;Problem solving                 Orientation Level: Disoriented to;Place;Situation Current Attention Level: Focused Memory: Decreased recall of precautions;Decreased short-term memory Following Commands: Follows one step commands consistently;Follows multi-step commands inconsistently;Follows one step commands with increased time Safety/Judgement: Decreased awareness of deficits;Decreased awareness of safety Awareness: Intellectual Problem Solving: Slow processing;Requires verbal cues;Requires tactile cues;Decreased initiation;Difficulty sequencing General Comments: able to answer questions, and carry on limited conversation, however communication is limited when participating in a motor task        Exercises     Shoulder Instructions       General Comments      Pertinent Vitals/ Pain       Pain Assessment: No/denies pain Faces Pain Scale: No hurt  Home Living                                          Prior Functioning/Environment              Frequency  Min 2X/week        Progress Toward Goals  OT Goals(current goals can now be found in the care plan section)  Progress towards OT goals: Progressing toward goals  Acute Rehab OT Goals Patient Stated Goal: Not stated OT Goal Formulation: With patient Time For Goal Achievement: 03/15/21 Potential to Achieve Goals: Good ADL Goals Pt Will Perform Grooming: with min guard assist;sitting Pt Will Perform Upper Body Bathing: with min assist;sitting Pt Will Perform Lower Body Bathing: with set-up;sit to/from stand Pt Will Perform Upper Body Dressing: with min assist;sitting Pt Will Perform Lower Body Dressing: with set-up;sit to/from stand Pt Will Transfer to Toilet: with modified independence;regular height toilet;ambulating Pt Will Perform Toileting - Clothing Manipulation and hygiene: with  set-up;sit to/from stand;sitting/lateral leans Additional ADL Goal #1: Grooming task standing at sink with setup and no VC's for sequencing.  Plan Discharge plan remains appropriate    Co-evaluation    PT/OT/SLP Co-Evaluation/Treatment: Yes Reason for Co-Treatment: Complexity of the patient's impairments (multi-system involvement)   OT goals addressed during session: ADL's and self-care      AM-PAC OT "6 Clicks" Daily Activity     Outcome Measure   Help from another person eating meals?: None Help from another person taking care of personal grooming?: A Little Help from another person toileting, which includes using toliet, bedpan, or urinal?: A Little Help from another person bathing (including washing, rinsing, drying)?: A Little Help from another person to put on and taking off regular upper body clothing?: A Little Help from another person to put on and taking off regular lower body clothing?: A Little 6 Click Score: 19    End of Session Equipment Utilized During Treatment: Gait belt;Rolling walker  OT Visit Diagnosis: Other symptoms and signs involving cognitive function;Muscle weakness (generalized) (M62.81) Pain - part of body: Arm;Shoulder;Leg;Ankle and joints of foot;Knee   Activity Tolerance Patient tolerated treatment well   Patient Left in chair;with call bell/phone within reach;with chair alarm set   Nurse Communication Mobility status        Time: 2993-7169 OT Time Calculation (min): 46 min  Charges: OT General Charges $OT Visit: 1 Visit OT Treatments $Self Care/Home Management : 8-22 mins  Alfonse Flavors, OTA   Dewain Penning 03/14/2021, 3:18 PM

## 2021-03-14 NOTE — Progress Notes (Signed)
Physical Therapy Treatment Patient Details Name: [name] MRN: [id number] DOB: 09-16-1965 Today's Date: 03/14/2021   History of Present Illness Pt is a 55 y/o female admitted for neurological evaluation of suspected encephalopathy.  However, brain imaging was normal, EEG unremarkable, UA negative, urine drug screen negative, procalcitonin low, B12, ammonia, and TSH normal.  Acute metabolic encephalopathy was ruled out. Progressive mental decline over previous 2 days so another EEG and CT head performed 8/30 with normal results, no sign of seizures. Improving mental status 02/18/21. PMH: intellectual disability, bipolar disorder, seizure disorder, type II diabetes, hypertension, hyperlipidemia, immune deficiency disorder.    PT Comments    STAR PT session: Focus of session determining goals of care and discharge planning. Pt supine in bed on entry singing along to [name] Eager to get out of bed and work with therapy. Pt with improved patience in allowing for set up before getting to EoB, which she was able to perform with supervision. Pt also able to transfers and ambulate with mainly supervision level assist, occasional min A for steadying with dynamic tasks. Pt continues to be limited in safe mobility in environments in which her attention is divided, decreasing her safety awareness. Pt also with difficulty in carryover of tasks like setting the Rollator breaks for sit<>stand and use of seat for rest breaks. Safe discharge plan would be for home with 24 hour supervision. Pt would also benefit from the socialization she was receiving at adult day care. PT will continue to work with patient on stair climbing to get into her second story apartment.    Recommendations for follow up therapy are one component of a multi-disciplinary discharge planning process, led by the attending physician.  Recommendations may be updated based on patient status, additional functional criteria and insurance  authorization.  Follow Up Recommendations  Supervision/Assistance - 24 hour;No PT follow up     Equipment Recommendations  Other (comment);3in1 (PT) (rollator (4 wheeled walker) with a seat, shower chair)       Precautions / Restrictions Precautions Precautions: Fall Precaution Comments: PEG tube Restrictions Other Position/Activity Restrictions: Increased alertness and very interactive     Mobility  Bed Mobility Overal bed mobility: Needs Assistance Bed Mobility: Supine to Sit     Supine to sit: Supervision     General bed mobility comments: pt requires some limited assist to untangle feet from covers, pt with improved patience with waiting for removal of Purewick and putting bed rail down before getting to EoB today    Transfers Overall transfer level: Needs assistance Equipment used: None;4-wheeled walker Transfers: Sit to/from Stand Sit to Stand: Supervision         General transfer comment: Multimodal cues to initiate power-up to full stand. No assist required from bed. Increased cuing for safe use of Rollator,especially in brake usage to insure safety  Ambulation/Gait Ambulation/Gait assistance: Supervision;Min assist Gait Distance (Feet): 500 Feet (1x seated rest break on Rollator) Assistive device: 4-wheeled walker Gait Pattern/deviations: Decreased stride length;Step-through pattern;Narrow base of support;Trunk flexed;Drifts right/left Gait velocity: Decreased Gait velocity interpretation: 1.31 - 2.62 ft/sec, indicative of limited community ambulator General Gait Details: Decreased awareness of other people/equipment in the hall and occasional assist to keep pt from walking into a group of people, stepping in front of transport with moving a bed, and hitting equipment in the hall with the rollator.       Balance Overall balance assessment: Needs assistance Sitting-balance support: Feet supported Sitting balance-Leahy Scale: Fair Sitting balance -  Comments: slightly unsteady while donning socks in seated   Standing balance support: No upper extremity supported;During functional activity Standing balance-Leahy Scale: Fair Standing balance comment: able to perform grooming tasks at sink without UE support                            Cognition Arousal/Alertness: Awake/alert Behavior During Therapy: Flat affect Overall Cognitive Status: Impaired/Different from baseline Area of Impairment: Orientation;Attention;Memory;Following commands;Safety/judgement;Awareness;Problem solving                 Orientation Level: Disoriented to;Place;Situation (Oriented to self, month and year) Current Attention Level: Focused Memory: Decreased recall of precautions;Decreased short-term memory Following Commands: Follows one step commands consistently;Follows multi-step commands inconsistently;Follows one step commands with increased time Safety/Judgement: Decreased awareness of deficits;Decreased awareness of safety Awareness: Intellectual Problem Solving: Slow processing;Requires verbal cues;Requires tactile cues;Decreased initiation;Difficulty sequencing General Comments: able to answer questions, and carry on limited conversation, however communication is limited when participating in a motor task         General Comments General comments (skin integrity, edema, etc.): VSS on RA, pt with difficulty with dual tasking, pt very concentrated on toothbrushing unable to indicate need for urination, resulting in incontinence      Pertinent Vitals/Pain Pain Assessment: No/denies pain Faces Pain Scale: No hurt     PT Goals (current goals can now be found in the care plan section) Acute Rehab PT Goals Patient Stated Goal: Not stated PT Goal Formulation: With family Time For Goal Achievement: 03/24/21 Potential to Achieve Goals: Good Progress towards PT goals: Progressing toward goals    Frequency    Min 5X/week      PT  Plan Current plan remains appropriate    Co-evaluation PT/OT/SLP Co-Evaluation/Treatment: Yes Reason for Co-Treatment: Complexity of the patient's impairments (multi-system involvement) PT goals addressed during session: Mobility/safety with mobility OT goals addressed during session: ADL's and self-care      AM-PAC PT "6 Clicks" Mobility   Outcome Measure  Help needed turning from your back to your side while in a flat bed without using bedrails?: None Help needed moving from lying on your back to sitting on the side of a flat bed without using bedrails?: None Help needed moving to and from a bed to a chair (including a wheelchair)?: A Little Help needed standing up from a chair using your arms (e.g., wheelchair or bedside chair)?: A Little Help needed to walk in hospital room?: A Little Help needed climbing 3-5 steps with a railing? : A Little 6 Click Score: 20    End of Session Equipment Utilized During Treatment: Gait belt Activity Tolerance: Patient tolerated treatment well Patient left: with call bell/phone within reach;in chair;with chair alarm set Nurse Communication: Mobility status PT Visit Diagnosis: Other abnormalities of gait and mobility (R26.89);Other symptoms and signs involving the nervous system (G29.528)     Time: 4132-4401 PT Time Calculation (min) (ACUTE ONLY): 46 min  Charges:  $Gait Training: 8-22 mins $Therapeutic Activity: 8-22 mins                     Hari Casaus B. Beverely Risen PT, DPT Acute Rehabilitation Services Pager [phone] Office [phone]    Elon Alas Fleet 03/14/2021, 11:03 AM

## 2021-03-14 NOTE — Progress Notes (Signed)
[facility] PROGRESS NOTE  SAVANA SPINA WUJ:[id number] DOB: Mar 21, 1966 DOA: 01/23/2021 PCP: Mirna Mires, MD  Status: Remains inpatient appropriate because:Altered mental status, Unsafe d/c plan, IV treatments appropriate due to intensity of illness or inability to take PO, and Inpatient level of care appropriate due to severity of illness  Dispo: The patient is from: Home              Anticipated d/c is to: Home: Sister who states she can provide 24/7 care with assistance of family              Patient currently is not medically stable to d/c.Marland Kitchen  Now that she has awakened need to ensure that patient can mobilize appropriately and navigate stairs before discharge home.   Difficult to place patient Yes   Level of care: Med-Surg  Code Status: Full Family Communication: Sister [name] 9/21-made aware that recent altered mentation related to reemergence of catatonia which is resolving with the addition of benzodiazepine. DVT prophylaxis: Lovenox COVID vaccination status: Unknown   HPI: 55 y.o. F with DM, HTN, developmental delay who presented with decreased oral intake and "saying bizarre things".   In the ER, CT head unremarkable.  She was hypoglycemic and otherwise had normal chemistries and hgb  Admitted for evaluation of erratic behavior.  Subjective: Awake and alert.  Donn Pierini music is playing in the background.  Patient is singing to the song that is playing  Objective: Vitals:   03/14/21 0000 03/14/21 0352  BP: 127/70 132/82  Pulse: (!) 110 (!) 108  Resp: 20 18  Temp: 97.7 F (36.5 C) 98.2 F (36.8 C)  SpO2: 100% 96%    Intake/Output Summary (Last 24 hours) at 03/14/2021 0743 Last data filed at 03/13/2021 2100 Gross per 24 hour  Intake 1305 ml  Output 1000 ml  Net 305 ml   Filed Weights   02/20/21 0432 02/22/21 0651 03/09/21 0612  Weight: 73.5 kg 71.6 kg 71.7 kg    Exam: Constitutional: NAD, alert  Respiratory: Anterior lung sounds clear to  auscultation, no increased work of breathing, room air Cardiac: Skin warm and dry, pulses regular, heart sounds S1-S2, normotensive Abdomen: PEG tube site unremarkable -LBM 9/25 abdomen soft and nontender  Neurologic: Cranial nerves II through XII are intact.  Patient moving all extremities equally with strength 4-5/5.  Sensation intact. Psychiatric: Awake and and alert, interactive in conversation and smiling.  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    Physical deconditioning  PT/OT aware of discharge plan to home.  Focus will be on stair navigation and increasing mobility Patient now significantly improved with mentation will be able to work more appropriately with therapy Patient continues to make progress but given ongoing impulsivity with ambulation, difficulty with following commands and lack of awareness of incontinence she is no longer considered as a candidate for CIR.  Plan is to discharge home with sister once patient is safe to do so PT/OT will focus on stair ambulation   Diabetes mellitus 2 uncontrolled on long-term insulin Continue Semglee and SSI Prior  to admission patient was on Lantus 40 units HS (we are using Semglee ) Continue metformin and Amaryl   Hypokalemia Hypomagnesemia Resolved         Data Reviewed: Basic Metabolic Panel: Recent Labs  Lab 03/10/21 0826 03/12/21 0814  NA 134* 135  K  4.0 4.1  CL 101 100  CO2 25 27  GLUCOSE 247* 220*  BUN 8 12  CREATININE 0.72 0.79  CALCIUM 9.4 9.8   Liver Function Tests: Recent Labs  Lab 03/10/21 0826  AST 43*  ALT 38  ALKPHOS 73  BILITOT 0.6  PROT 6.7  ALBUMIN 3.0*   No results for input(s): LIPASE, AMYLASE in the last 168 hours. No results for input(s): AMMONIA in the last 168 hours. CBC: No results for input(s): WBC, NEUTROABS, HGB, HCT, MCV, PLT in the last 168 hours.    CBG: Recent Labs  Lab 03/13/21 0603 03/13/21 1243 03/13/21 1636 03/13/21 2130 03/14/21 0622  GLUCAP 145* 170* 139* 173* 163*    Scheduled Meds:  enoxaparin (LOVENOX) injection  40 mg Subcutaneous Q24H   feeding supplement (GLUCERNA SHAKE)  237 mL Oral TID BM   free water  100 mL Per Tube Q8H   glimepiride  2 mg Oral Q breakfast   hydrocerin   Topical BID   insulin aspart  0-15 Units Subcutaneous TID WC   insulin aspart  0-5 Units Subcutaneous QHS   insulin aspart  8 Units Subcutaneous TID WC   insulin glargine-yfgn  30 Units Subcutaneous Daily   levETIRAcetam  1,000 mg Oral BID   loratadine  10 mg Oral Daily   LORazepam  0.5 mg Oral QAC breakfast   metFORMIN  500 mg Oral BID WC   metoprolol succinate  12.5 mg Oral Daily   multivitamin with minerals  1 tablet Oral Daily   risperiDONE  2 mg Oral QHS   valACYclovir  500 mg Oral QHS      Principal Problem:   Acute encephalopathy Active Problems:   Hypoglycemia   Constipation   Bipolar disorder (HCC)   Diabetes (HCC)   AMS (altered mental status)   Poor appetite   Psychogenic loss of appetite   Intellectual delay   Consultants: Neurology Psychiatry  Procedures: EEG Long-term EEG  Antibiotics: Valacyclovir 8/15 through 8/28   Time spent: 15 minutes    Junious Silk ANP  [facility] 7 am - 330 pm/M-F for direct patient care and secure chat Please refer to Amion for contact info 48  days

## 2021-03-14 NOTE — Progress Notes (Signed)
Occupational Therapy Treatment Patient Details Name: [name] MRN: [id number] DOB: 06/09/1966 Today's Date: 03/14/2021   History of present illness     OT comments  [facility] OT session:  Patient performed bed mobility, LB dressing, self standing at sink, and transfers with RW.  Patient required frequent cues for safety, pacing self with RW and sequencing.  Patient would benefit from further OT under [facility] program.    Recommendations for follow up therapy are one component of a multi-disciplinary discharge planning process, led by the attending physician.  Recommendations may be updated based on patient status, additional functional criteria and insurance authorization.    Follow Up Recommendations       Equipment Recommendations       Recommendations for Other Services      Precautions / Restrictions         Mobility Bed Mobility                    Transfers                      Balance                                           ADL either performed or assessed with clinical judgement   ADL                                               Vision       Perception     Praxis      Cognition                                                Exercises     Shoulder Instructions       General Comments      Pertinent Vitals/ Pain          Home Living                                          Prior Functioning/Environment              Frequency           Progress Toward Goals  OT Goals(current goals can now be found in the care plan section)        Plan      Co-evaluation                 AM-PAC OT "6 Clicks" Daily Activity     Outcome Measure                    End of Session        Activity Tolerance     Patient Left     Nurse Communication          Time:  -     Charges:    Alfonse Flavors, OTA   Dewain Penning 03/14/2021, 2:55 PM

## 2021-03-15 LAB — GLUCOSE, CAPILLARY
Glucose-Capillary: 146 mg/dL — ABNORMAL HIGH (ref 70–99)
Glucose-Capillary: 201 mg/dL — ABNORMAL HIGH (ref 70–99)
Glucose-Capillary: 133 mg/dL — ABNORMAL HIGH (ref 70–99)
Glucose-Capillary: 126 mg/dL — ABNORMAL HIGH (ref 70–99)
Glucose-Capillary: 196 mg/dL — ABNORMAL HIGH (ref 70–99)

## 2021-03-15 NOTE — Progress Notes (Addendum)
[facility] PROGRESS NOTE  [name] CWC:[id number] DOB: 10-24-1965 DOA: 01/23/2021 PCP: Mirna Mires, MD  Status: Remains inpatient appropriate because:Altered mental status, Unsafe d/c plan, IV treatments appropriate due to intensity of illness or inability to take PO, and Inpatient level of care appropriate due to severity of illness  Dispo: The patient is from: Home              Anticipated d/c is to: Home: Sister who states she can provide 24/7 care with assistance of family              Patient currently is not medically stable to d/c.Marland Kitchen  Now that she has awakened need to ensure that patient can mobilize appropriately and navigate stairs before discharge home.   Difficult to place patient Yes   Level of care: Med-Surg  Code Status: Full Family Communication: Sister [name] 9/27 updated on significant improvements in therapy/mobility.  Updated that therapist would like for her to attend a therapy session if possible.   DVT prophylaxis: Lovenox COVID vaccination status: Unknown   HPI: 55 y.o. F with DM, HTN, developmental delay who presented with decreased oral intake and "saying bizarre things".   In the ER, CT head unremarkable.  She was hypoglycemic and otherwise had normal chemistries and hgb  Admitted for evaluation of erratic behavior.  Subjective: Sitting up in chair watching TV.  Pleasant affect.  Very interactive.  Objective: Vitals:   03/15/21 0036 03/15/21 0447  BP: 134/81 131/84  Pulse: (!) 108 (!) 104  Resp: 18 18  Temp: 98 F (36.7 C) 98 F (36.7 C)  SpO2: 100% 99%    Intake/Output Summary (Last 24 hours) at 03/15/2021 7616 Last data filed at 03/14/2021 2000 Gross per 24 hour  Intake 2020 ml  Output 1 ml  Net 2019 ml   Filed Weights   02/20/21 0432 02/22/21 0651 03/09/21 0612  Weight: 73.5 kg 71.6 kg 71.7 kg    Exam: Constitutional: NAD, alert and sitting up in chair Respiratory: Lung sounds CTA, no increased work of breathing, no cough,  stable on room Cardiac: Normotensive, S1-S2, no peripheral edema, regular pulse Abdomen: PEG tube site unremarkable -LBM 9/26 abdomen soft and nontender  Neurologic: Cranial nerves II through XII are intact.  Patient moving all extremities equally with strength 4-5/5.  Sensation intact. Psychiatric: Awake and and alert, interactive in conversation and smiling.    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  The earliest the tube can be removed is 10/10   Seizure disorder POA Continue keppra Recent EEG unremarkable.    Physical deconditioning  PT/OT aware of discharge plan to home.  Focus will be on stair navigation and increasing mobility Patient now able to navigate up to 19 stairs with minimal assist.  Still needs 24/7 care in regards to impulsivity.  Therapy has requested sister come observe a therapy session before she is discharged.   Diabetes mellitus 2 uncontrolled on long-term insulin Continue Semglee and SSI Prior to admission patient was on Lantus 40 units HS (we are using Semglee ) Continue metformin and Amaryl  Hypokalemia Hypomagnesemia Resolved         Data Reviewed: Basic Metabolic Panel: Recent Labs  Lab 03/10/21 0826 03/12/21 0814  NA 134* 135  K 4.0 4.1  CL 101 100  CO2 25 27  GLUCOSE 247* 220*  BUN 8 12  CREATININE 0.72 0.79  CALCIUM 9.4 9.8   Liver Function Tests: Recent Labs  Lab 03/10/21 0826  AST 43*  ALT 38   ALKPHOS 73  BILITOT 0.6  PROT 6.7  ALBUMIN 3.0*   No results for input(s): LIPASE, AMYLASE in the last 168 hours. No results for input(s): AMMONIA in the last 168 hours. CBC: Recent Labs  Lab 03/14/21 0718  WBC 10.9*  HGB 12.4  HCT 38.4  MCV 91.9  PLT 361      CBG: Recent Labs  Lab 03/13/21 1636 03/13/21 2130 03/14/21 0622 03/14/21 1218 03/14/21 1654  GLUCAP 139* 173* 163* 178* 173*    Scheduled Meds:  enoxaparin (LOVENOX) injection  40 mg Subcutaneous Q24H   feeding supplement (GLUCERNA SHAKE)  237 mL Oral TID BM   free water  100 mL Per Tube Q8H   glimepiride  2 mg Oral Q breakfast   hydrocerin   Topical BID   insulin aspart  0-15 Units Subcutaneous TID WC   insulin aspart  0-5 Units Subcutaneous QHS   insulin aspart  8 Units Subcutaneous TID WC   insulin glargine-yfgn  30 Units Subcutaneous Daily   levETIRAcetam  1,000 mg Oral BID   loratadine  10 mg Oral Daily   LORazepam  0.5 mg Oral QAC breakfast   metFORMIN  500 mg Oral BID WC   metoprolol succinate  12.5 mg Oral Daily   multivitamin with minerals  1 tablet Oral Daily   risperiDONE  2 mg Oral QHS   valACYclovir  500 mg Oral QHS      Principal Problem:   Acute encephalopathy Active Problems:   Hypoglycemia   Constipation   Bipolar disorder (HCC)   Diabetes (HCC)   AMS (altered mental status)   Poor appetite   Psychogenic loss of appetite   Intellectual delay   Consultants: Neurology Psychiatry  Procedures: EEG Long-term EEG  Antibiotics: Valacyclovir 8/15 through 8/28   Time spent: 15 minutes    Junious Silk ANP  [facility] 7 am - 330 pm/M-F for direct patient care and secure chat Please refer to Amion for contact info 49  days

## 2021-03-15 NOTE — Progress Notes (Signed)
Physical Therapy Treatment Patient Details Name: [name] MRN: [id number] DOB: 12/10/1965 Today's Date: 03/15/2021   History of Present Illness Pt is a 55 y/o female admitted for neurological evaluation of suspected encephalopathy.  However, brain imaging was normal, EEG unremarkable, UA negative, urine drug screen negative, procalcitonin low, B12, ammonia, and TSH normal.  Acute metabolic encephalopathy was ruled out. Progressive mental decline over previous 2 days so another EEG and CT head performed 8/30 with normal results, no sign of seizures. Improving mental status 02/18/21. PMH: intellectual disability, bipolar disorder, seizure disorder, type II diabetes, hypertension, hyperlipidemia, immune deficiency disorder.    PT Comments    Pt sitting up in recliner on entry, eager to work with therapy. Pt with improved carryover of knowing she needs to do something with the brakes on Rollator before she gets up, however needs cuing for actual brake engagement. Pt able to ambulate in hallways with better safety awareness and minor cuing for obstacle avoidance. Pt then able to enter stair well and ascend/descend 19 steps with minA for steadying. After seated rest break able to perform a second time. Seated rest break and then able to ambulate back to room. Pt was able to tell therapist she is in room 20 and then was able to find it on her own. Pt is making excellent progress towards her goals physically, however continue to recommend 24 hour supervision for safety. Pt will continue to follow acutely to work towards discharge home.   Recommendations for follow up therapy are one component of a multi-disciplinary discharge planning process, led by the attending physician.  Recommendations may be updated based on patient status, additional functional criteria and insurance authorization.  Follow Up Recommendations  Supervision/Assistance - 24 hour;No PT follow up     Equipment Recommendations  Other  (comment);3in1 (PT) (rollator (4 wheeled walker) with a seat, shower chair)       Precautions / Restrictions Precautions Precautions: Fall Precaution Comments: PEG tube Restrictions Other Position/Activity Restrictions: Increased alertness and very interactive     Mobility  Bed Mobility               General bed mobility comments: OOB in recliner on entry    Transfers Overall transfer level: Needs assistance Equipment used: 4-wheeled walker Transfers: Sit to/from Stand Sit to Stand: Supervision         General transfer comment: continue to work on safety with [employer], for sit to stand,  Ambulation/Gait Ambulation/Gait assistance: Multimedia programmer (Feet): 600 Feet Assistive device: 4-wheeled walker Gait Pattern/deviations: Decreased stride length;Step-through pattern;Trunk flexed;Drifts right/left Gait velocity: Decreased Gait velocity interpretation: 1.31 - 2.62 ft/sec, indicative of limited community ambulator General Gait Details: pt with better navigation in hallway, continues to need cues for safety with navigation   Stairs Stairs: Yes Stairs assistance: +2 safety/equipment;Min assist Stair Management: Two rails;Step to pattern;Forwards Number of Stairs: 19 (x2) General stair comments: slow, steady ascent/descent of 19 steps with bilateral handrails. able to perform x2 with seated rest break in between         Balance Overall balance assessment: Needs assistance Sitting-balance support: Feet supported Sitting balance-Leahy Scale: Fair     Standing balance support: No upper extremity supported;During functional activity Standing balance-Leahy Scale: Fair Standing balance comment: able to perform grooming tasks at sink without UE support                            Cognition Arousal/Alertness:  Awake/alert Behavior During Therapy: Flat affect Overall Cognitive Status: Impaired/Different from baseline Area of  Impairment: Orientation;Attention;Memory;Following commands;Safety/judgement;Awareness;Problem solving                 Orientation Level: Disoriented to;Place;Situation Current Attention Level: Focused Memory: Decreased recall of precautions;Decreased short-term memory Following Commands: Follows one step commands consistently;Follows multi-step commands inconsistently;Follows one step commands with increased time Safety/Judgement: Decreased awareness of deficits;Decreased awareness of safety Awareness: Intellectual Problem Solving: Slow processing;Requires verbal cues;Requires tactile cues;Decreased initiation;Difficulty sequencing General Comments: continues to have difficulty with dual tasking, impoved carry over with Rollator safety         General Comments General comments (skin integrity, edema, etc.): VSS on RA      Pertinent Vitals/Pain Pain Assessment: No/denies pain Faces Pain Scale: No hurt     PT Goals (current goals can now be found in the care plan section) Acute Rehab PT Goals Patient Stated Goal: Not stated PT Goal Formulation: With family Time For Goal Achievement: 03/24/21 Potential to Achieve Goals: Good Progress towards PT goals: Progressing toward goals    Frequency    Min 5X/week      PT Plan Current plan remains appropriate       AM-PAC PT "6 Clicks" Mobility   Outcome Measure  Help needed turning from your back to your side while in a flat bed without using bedrails?: None Help needed moving from lying on your back to sitting on the side of a flat bed without using bedrails?: None Help needed moving to and from a bed to a chair (including a wheelchair)?: A Little Help needed standing up from a chair using your arms (e.g., wheelchair or bedside chair)?: A Little Help needed to walk in hospital room?: A Little Help needed climbing 3-5 steps with a railing? : A Little 6 Click Score: 20    End of Session Equipment Utilized During  Treatment: Gait belt Activity Tolerance: Patient tolerated treatment well Patient left: Other (comment) (standing at sink with OT performing grooming) Nurse Communication: Mobility status PT Visit Diagnosis: Other abnormalities of gait and mobility (R26.89);Other symptoms and signs involving the nervous system (I96.789)     Time: 3810-1751 PT Time Calculation (min) (ACUTE ONLY): 37 min  Charges:  $Gait Training: 23-37 mins                     Kregg Cihlar B. Beverely Risen PT, DPT Acute Rehabilitation Services Pager [phone] Office [phone]    Elon Alas Fleet 03/15/2021, 10:12 AM

## 2021-03-15 NOTE — Plan of Care (Signed)

## 2021-03-15 NOTE — Progress Notes (Signed)
Occupational Therapy Treatment Patient Details Name: [name] MRN: [id number] DOB: 1966/02/27 Today's Date: 03/15/2021   History of present illness Pt is a 55 y/o female admitted for neurological evaluation of suspected encephalopathy.  However, brain imaging was normal, EEG unremarkable, UA negative, urine drug screen negative, procalcitonin low, B12, ammonia, and TSH normal.  Acute metabolic encephalopathy was ruled out. Progressive mental decline over previous 2 days so another EEG and CT head performed 8/30 with normal results, no sign of seizures. Improving mental status 02/18/21. PMH: intellectual disability, bipolar disorder, seizure disorder, type II diabetes, hypertension, hyperlipidemia, immune deficiency disorder.   OT comments  STAR program OT session: Performed mobility with rollator walker and vcs for safety, pacing, and locking brakes.  Patient performed grooming and UB bathing standing at sink with vcs for sequencing and staying on tasks. Patient performed toilet transfer with min assist and vcs to use rails in bathroom. Patient assisted with toilet hygiene but required assistance to complete.  Patient doffed socks and washed feet and applied lotion with vcs for sequencing. Patient to continue with STAR program.    Recommendations for follow up therapy are one component of a multi-disciplinary discharge planning process, led by the attending physician.  Recommendations may be updated based on patient status, additional functional criteria and insurance authorization.    Follow Up Recommendations  Other (comment) (Currently on STAR program)    Equipment Recommendations  3 in 1 bedside commode;Tub/shower seat    Recommendations for Other Services      Precautions / Restrictions Precautions Precautions: Fall Precaution Comments: PEG tube       Mobility Bed Mobility               General bed mobility comments: OOB and with PT upon entry and left in recliner     Transfers Overall transfer level: Needs assistance Equipment used: 4-wheeled walker Transfers: Sit to/from Stand Sit to Stand: Supervision Stand pivot transfers: Supervision       General transfer comment: requires cues to use brakes    Balance Overall balance assessment: Needs assistance Sitting-balance support: Feet supported Sitting balance-Leahy Scale: Fair Sitting balance - Comments: slightly unsteady while donning socks in seated   Standing balance support: No upper extremity supported;During functional activity Standing balance-Leahy Scale: Fair Standing balance comment: one extremity supported during toilet hygiene                           ADL either performed or assessed with clinical judgement   ADL Overall ADL's : Needs assistance/impaired     Grooming: Wash/dry hands;Wash/dry face;Oral care;Supervision/safety;Standing Grooming Details (indicate cue type and reason): vcs for sequencing Upper Body Bathing: Supervision/ safety;Min guard;Standing;Cueing for sequencing Upper Body Bathing Details (indicate cue type and reason): cues for sequecing         Lower Body Dressing: Supervision/safety;Cueing for sequencing;Sitting/lateral leans Lower Body Dressing Details (indicate cue type and reason): doffed and donned footwear Toilet Transfer: Min guard;Cueing for Health and safety inspector Details (indicate cue type and reason): verbal cues to use rails with transfer Toileting- Clothing Manipulation and Hygiene: Moderate assistance Toileting - Clothing Manipulation Details (indicate cue type and reason): patient performed toilet hygiene with assitance to complete     Functional mobility during ADLs: Supervision/safety;Rolling walker General ADL Comments: cues for safety and pacing self     Vision       Perception     Praxis      Cognition Arousal/Alertness:  Awake/alert Behavior During Therapy: Flat affect Overall Cognitive Status:  Impaired/Different from baseline Area of Impairment: Orientation;Attention;Memory;Following commands;Safety/judgement;Awareness;Problem solving                 Orientation Level: Disoriented to;Place;Situation Current Attention Level: Focused Memory: Decreased recall of precautions;Decreased short-term memory Following Commands: Follows one step commands consistently;Follows multi-step commands inconsistently;Follows one step commands with increased time Safety/Judgement: Decreased awareness of deficits;Decreased awareness of safety Awareness: Intellectual Problem Solving: Slow processing;Requires verbal cues;Requires tactile cues;Decreased initiation;Difficulty sequencing General Comments: requires cues for sequencing and staying on tasks        Exercises     Shoulder Instructions       General Comments      Pertinent Vitals/ Pain       Pain Assessment: Faces Faces Pain Scale: No hurt  Home Living                                          Prior Functioning/Environment              Frequency  Min 2X/week        Progress Toward Goals  OT Goals(current goals can now be found in the care plan section)  Progress towards OT goals: Progressing toward goals  Acute Rehab OT Goals Patient Stated Goal: Not stated OT Goal Formulation: With patient Time For Goal Achievement: 03/15/21 Potential to Achieve Goals: Good ADL Goals Pt Will Perform Grooming: with min guard assist;sitting Pt Will Perform Upper Body Bathing: with min assist;sitting Pt Will Perform Lower Body Bathing: with set-up;sit to/from stand Pt Will Perform Upper Body Dressing: with min assist;sitting Pt Will Perform Lower Body Dressing: with set-up;sit to/from stand Pt Will Transfer to Toilet: with modified independence;regular height toilet;ambulating Pt Will Perform Toileting - Clothing Manipulation and hygiene: with set-up;sit to/from stand;sitting/lateral leans Additional ADL  Goal #1: Grooming task standing at sink with setup and no VC's for sequencing.  Plan Discharge plan remains appropriate    Co-evaluation                 AM-PAC OT "6 Clicks" Daily Activity     Outcome Measure   Help from another person eating meals?: None Help from another person taking care of personal grooming?: A Little Help from another person toileting, which includes using toliet, bedpan, or urinal?: A Little Help from another person bathing (including washing, rinsing, drying)?: A Little Help from another person to put on and taking off regular upper body clothing?: A Little Help from another person to put on and taking off regular lower body clothing?: A Little 6 Click Score: 19    End of Session Equipment Utilized During Treatment: Gait belt (4 wheel walker)  OT Visit Diagnosis: Other symptoms and signs involving cognitive function;Muscle weakness (generalized) (M62.81) Pain - part of body: Arm;Shoulder;Leg;Ankle and joints of foot;Knee   Activity Tolerance Patient tolerated treatment well   Patient Left in chair;with call bell/phone within reach;with chair alarm set   Nurse Communication Mobility status        Time: 2841-3244 OT Time Calculation (min): 45 min  Charges: OT General Charges $OT Visit: 1 Visit OT Treatments $Self Care/Home Management : 23-37 mins $Therapeutic Activity: 8-22 mins  Alfonse Flavors, OTA   Dewain Penning 03/15/2021, 3:48 PM

## 2021-03-15 NOTE — Progress Notes (Signed)
Speech Language Pathology Treatment: Cognitive-Linquistic  Patient Details Name: [name] MRN: [id number] DOB: May 06, 1966 Today's Date: 03/15/2021 Time: 1155-1209 SLP Time Calculation (min) (ACUTE ONLY): 14 min  Assessment / Plan / Recommendation Clinical Impression  Pt alert upright in chair upon SLP arrival for skilled cognitive-linguistic treatment. Pt continues to exhibit difficulty with recalling orientation information in regards to time and place. She knows she is in the hospital, but was unable to identify current state or city despite choice cues. She independently identified current month and provided year given choice cues. Pt's meal tray arrived during session and she was able to name all objects/meal items (confrontational naming) without difficulty. When provided a description of various objects (responsive naming) that she might need during mealtime, she verbally identified them independently as well. Will continue to follow pt for motor speech and cognitive treatment during acute stay.    HPI HPI: 55 y.o. female with medical history significant of intellectual disability, bipolar disorder, seizure disorder, type II diabetes, hypertension, hyperlipidemia, immune deficiency disorder presenting to the ED on 01/23/21 for evaluation of altered mental status.  Patient's sister reported that she has not been eating for the past few days and has been saying things which are bizarre.  Reported slurred speech since yesterday.  Also reporting abdominal pain and no bowel movement in 2 weeks despite being given bisacodyl.  No witnessed seizure.  In the ED, slightly tachycardic on arrival, heart rate subsequently improved.  No fever.  Labs showing WBC 9.2, hemoglobin 13.7, platelet count 442.  Sodium 138, potassium 3.5, chloride 102, bicarb 23, BUN 11, creatinine 0.9, glucose 81.  Lipase and LFTs normal.  UA pending.  Head CT negative for acute finding.  Chest x-ray showing no active disease.   Abdominal x-ray showing moderate stool burden and nonobstructive bowel gas pattern.  Her blood glucose dropped to 61 in the ED and she was given IV dextrose; ST evaluated for BSE and s/o previously this hospitalization; SLE ordered to assess current mentation/speech.      SLP Plan  Continue with current plan of care      Recommendations for follow up therapy are one component of a multi-disciplinary discharge planning process, led by the attending physician.  Recommendations may be updated based on patient status, additional functional criteria and insurance authorization.    Recommendations                   Follow up Recommendations: Outpatient SLP SLP Visit Diagnosis: Cognitive communication deficit (J00.938) Plan: Continue with current plan of care       GO               Avie Echevaria, MA, CCC-SLP Acute Rehabilitation Services Office Number: [phone]  Paulette Blanch  03/15/2021, 1:03 PM

## 2021-03-16 DIAGNOSIS — G9340 Encephalopathy, unspecified: Secondary | ICD-10-CM | POA: Diagnosis not present

## 2021-03-16 LAB — GLUCOSE, CAPILLARY
Glucose-Capillary: 122 mg/dL — ABNORMAL HIGH (ref 70–99)
Glucose-Capillary: 187 mg/dL — ABNORMAL HIGH (ref 70–99)
Glucose-Capillary: 210 mg/dL — ABNORMAL HIGH (ref 70–99)
Glucose-Capillary: 179 mg/dL — ABNORMAL HIGH (ref 70–99)
Glucose-Capillary: 165 mg/dL — ABNORMAL HIGH (ref 70–99)

## 2021-03-16 MED ORDER — POLYETHYLENE GLYCOL 3350 17 G PO PACK: 17.0000 g | PACK | Freq: Every day | ORAL | Status: DC

## 2021-03-16 MED ORDER — SENNOSIDES-DOCUSATE SODIUM 8.6-50 MG PO TABS
1.0000 | ORAL_TABLET | Freq: Two times a day (BID) | ORAL | Status: DC
  Administered 2021-03-16 – 2021-03-25 (×19): 1 via ORAL
  Filled 2021-03-16 (×19): qty 1

## 2021-03-16 MED ORDER — SENNOSIDES-DOCUSATE SODIUM 8.6-50 MG PO TABS: 1.0000 | ORAL_TABLET | Freq: Every day | ORAL | Status: DC

## 2021-03-16 MED ORDER — POLYETHYLENE GLYCOL 3350 17 G PO PACK
17.0000 g | PACK | Freq: Two times a day (BID) | ORAL | Status: DC
  Administered 2021-03-16 – 2021-03-25 (×17): 17 g via ORAL
  Filled 2021-03-16 (×18): qty 1

## 2021-03-16 NOTE — Progress Notes (Addendum)
Occupational Therapy Treatment Patient Details Name: [name] MRN: [id number] DOB: Oct 27, 1965 Today's Date: 03/16/2021   History of present illness Pt is a 55 y/o female admitted for neurological evaluation of suspected encephalopathy.  However, brain imaging was normal, EEG unremarkable, UA negative, urine drug screen negative, procalcitonin low, B12, ammonia, and TSH normal.  Acute metabolic encephalopathy was ruled out. Progressive mental decline over previous 2 days so another EEG and CT head performed 8/30 with normal results, no sign of seizures. Improving mental status 02/18/21. PMH: intellectual disability, bipolar disorder, seizure disorder, type II diabetes, hypertension, hyperlipidemia, immune deficiency disorder.   OT comments  Patient received in recliner and was eager to perform self care at sink.  Patient continues to require vcs for sequencing for ADLs but is able to initiate more without cueing. Addressed rollator use and safety with locking brakes, reaching back to sit, and pushing up to stand. Patient to continue in [facility] program.    Recommendations for follow up therapy are one component of a multi-disciplinary discharge planning process, led by the attending physician.  Recommendations may be updated based on patient status, additional functional criteria and insurance authorization.    Follow Up Recommendations     Home Health OT  Equipment Recommendations  3 in 1 bedside commode;Tub/shower seat    Recommendations for Other Services      Precautions / Restrictions Precautions Precautions: Fall Precaution Comments: PEG tube Restrictions Weight Bearing Restrictions: No Other Position/Activity Restrictions: Increased alertness and very interactive       Mobility Bed Mobility Overal bed mobility: Modified Independent             General bed mobility comments: up in recliner    Transfers Overall transfer level: Needs assistance Equipment used:  4-wheeled walker Transfers: Sit to/from Stand Sit to Stand: Supervision         General transfer comment: vc for pushing up from chair adn losking/unlocking brakes on rollator    Balance Overall balance assessment: Needs assistance Sitting-balance support: Feet supported Sitting balance-Leahy Scale: Good     Standing balance support: No upper extremity supported;During functional activity Standing balance-Leahy Scale: Fair Standing balance comment: able to perform self care tasks standing at sink                           ADL either performed or assessed with clinical judgement   ADL Overall ADL's : Needs assistance/impaired     Grooming: Wash/dry hands;Wash/dry face;Oral care;Applying deodorant;Cueing for sequencing;Standing Grooming Details (indicate cue type and reason): less frequent cueing required Upper Body Bathing: Supervision/ safety;Min guard;Standing;Cueing for sequencing   Lower Body Bathing: Sitting/lateral leans;Sit to/from stand;Supervison/ safety Lower Body Bathing Details (indicate cue type and reason): bathed feed seated and perineal care standing Upper Body Dressing : Minimal assistance;Cueing for sequencing;Sitting Upper Body Dressing Details (indicate cue type and reason): donned gown seated     Toilet Transfer: Min guard;Cueing for Administrator, sports Details (indicate cue type and reason): verbal cues for rail use and safety Toileting- Clothing Manipulation and Hygiene: Minimal assistance;Cueing for sequencing Toileting - Clothing Manipulation Details (indicate cue type and reason): performed while standing     Functional mobility during ADLs: Supervision/safety;Min guard General ADL Comments: Requires mod VC for problem solving and task completion when standing at sink for grooming and bathing; no physical assist needed; No LOB noted     Vision       Perception  Praxis      Cognition Arousal/Alertness:  Awake/alert Behavior During Therapy: Flat affect Overall Cognitive Status: No family/caregiver present to determine baseline cognitive functioning Area of Impairment: Orientation;Attention;Memory;Following commands;Safety/judgement;Awareness;Problem solving                 Orientation Level: Disoriented to;Place;Situation Current Attention Level: Focused Memory: Decreased recall of precautions;Decreased short-term memory Following Commands: Follows one step commands consistently;Follows multi-step commands inconsistently;Follows one step commands with increased time Safety/Judgement: Decreased awareness of deficits;Decreased awareness of safety Awareness: Intellectual Problem Solving: Slow processing General Comments: increased following directions, required less frequent cues for sequecing with self care.        Exercises     Shoulder Instructions       General Comments VSS on RA, pt bed pad noted to have BM, pt reports she needs to go and was able to walk to bathroom without assist, she sat on toilet for approx 5 min and was noted to have large stool lodged in rectum, NP in room attempted disimpaction    Pertinent Vitals/ Pain       Pain Assessment: Faces Faces Pain Scale: No hurt  Home Living                                          Prior Functioning/Environment              Frequency  Min 5X/week        Progress Toward Goals  OT Goals(current goals can now be found in the care plan section)  Progress towards OT goals: Goals met and updated - see care plan  Acute Rehab OT Goals Patient Stated Goal: to brush her teeth OT Goal Formulation: With patient Time For Goal Achievement: 03/29/21 Potential to Achieve Goals: Good ADL Goals Pt Will Perform Grooming: with min guard assist;sitting Pt Will Perform Upper Body Bathing: with min assist;sitting Pt Will Perform Lower Body Bathing: with supervision;sit to/from stand Pt Will Perform  Upper Body Dressing: with min assist;sitting Pt Will Perform Lower Body Dressing: with supervision;sit to/from stand Pt Will Transfer to Toilet: with modified independence Pt Will Perform Toileting - Clothing Manipulation and hygiene: with supervision;sitting/lateral leans Additional ADL Goal #1: Pt will complete grooming tasks in standing position at sink level with min vc Additional ADL Goal #2: Regis Bill will verbalize understanding of how to assist pt with ADL tasks to maximize safety and funcitonal level of independence Additional ADL Goal #3: Staff/caregiver will implement bowel/bladder program, toileting pt q 2 hrs when awake to increase awareness of bowel/bladder control adn reduce frequencies of incontinence episodes.  Plan Discharge plan remains appropriate;Frequency needs to be updated    Co-evaluation                 AM-PAC OT "6 Clicks" Daily Activity     Outcome Measure   Help from another person eating meals?: None Help from another person taking care of personal grooming?: A Little Help from another person toileting, which includes using toliet, bedpan, or urinal?: A Little Help from another person bathing (including washing, rinsing, drying)?: A Little Help from another person to put on and taking off regular upper body clothing?: A Little Help from another person to put on and taking off regular lower body clothing?: A Little 6 Click Score: 19    End of Session Equipment Utilized During Treatment: Other (comment) (rollator walker)  OT Visit Diagnosis: Other symptoms and signs involving cognitive function;Muscle weakness (generalized) (M62.81)   Activity Tolerance Patient tolerated treatment well   Patient Left in chair;with call bell/phone within reach;with chair alarm set   Nurse Communication Other (comment) (discussed bowel and bladder program)        Time: 4604-7998 OT Time Calculation (min): 31 min  Charges: OT General Charges $OT Visit: 1 Visit OT  Treatments $Self Care/Home Management : 8-22 mins $Therapeutic Activity: 8-22 mins  Lodema Hong, OTA   Roslin Norwood Alexis Goodell 03/16/2021, 10:41 AM

## 2021-03-16 NOTE — Progress Notes (Signed)
[facility] PROGRESS NOTE  ERIONA KINCHEN PRF:[id number] DOB: [birth year] DOA: 01/23/2021 PCP: Mirna Mires, MD  Status: Remains inpatient appropriate because:Altered mental status, Unsafe d/c plan, IV treatments appropriate due to intensity of illness or inability to take PO, and Inpatient level of care appropriate due to severity of illness  Dispo: The patient is from: Home              Anticipated d/c is to: Home: Sister who states she can provide 24/7 care with assistance of family              Patient currently is not medically stable to d/c.Marland Kitchen  Now that she has awakened need to ensure that patient can mobilize appropriately and navigate stairs before discharge home.   Difficult to place patient Yes   Level of care: Med-Surg  Code Status: Full Family Communication: Sister [name] 9/27 updated on significant improvements in therapy/mobility.  Updated that therapist would like for her to attend a therapy session if possible.   DVT prophylaxis: Lovenox COVID vaccination status: Unknown   HPI: 55 y.o. F with DM, HTN, developmental delay who presented with decreased oral intake and "saying bizarre things".   In the ER, CT head unremarkable.  She was hypoglycemic and otherwise had normal chemistries and hgb  Admitted for evaluation of erratic behavior.  Subjective: Alert.  Aware of need to have bowel movement.  Taken to bathroom but unable to pass bowel movement.  When checked by therapy staff she was noted to have significant rectal impaction.  Attempted to disimpact her and I got a small to moderate volume of stool out but the stool was too soft and there was a significant amount left in the rectal vault  Objective: Vitals:   03/15/21 2311 03/16/21 0313  BP: 122/78 124/73  Pulse: (!) 109 94  Resp: 18 18  Temp: 98.7 F (37.1 C) 98 F (36.7 C)  SpO2: 99% 99%    Intake/Output Summary (Last 24 hours) at 03/16/2021 0733 Last data filed at 03/16/2021 0440 Gross per 24 hour   Intake 2014 ml  Output 2700 ml  Net -686 ml   Filed Weights   02/20/21 0432 02/22/21 0651 03/09/21 0612  Weight: 73.5 kg 71.6 kg 71.7 kg    Exam: Constitutional: NAD, alert and ambulating with therapist to bathroom with Rollator Respiratory: Clear to auscultation, no increased work of breathing while walking, stable on room air Cardiac: S1-S2, no peripheral edema, regular pulse and normotensive Abdomen: PEG tube site unremarkable -LBM 9/29 abdomen soft and nontender -rectal exam reveals significant amount of stool in rectal vault.  Due to softness of stool unable to disimpact completely. Neurologic: Cranial nerves II through XII are intact.  Patient moving all extremities equally with strength 4-5/5.  Sensation intact. Psychiatric: Awake and and alert, interactive in conversation and smiling.    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    Physical deconditioning  PT/OT aware of discharge plan to home.  Focus will be  on stair navigation and increasing mobility Patient now able to navigate up to 19 stairs with minimal assist.  Still needs 24/7 care in regards to impulsivity.   Sister unable to come observe patient ambulating due to work responsibilities therefore I videoed the patient ambulating and navigating the stairs and sent as a text message to  sister's cell phone-I confirmed cell phone number before sending and I have previously spoken to the sister on this phone and have confirmed her identity and this phone number.   Diabetes mellitus 2 uncontrolled on long-term insulin Continue Semglee and SSI Prior to admission patient was on Lantus 40 units HS (we are using Semglee ) Continue metformin and Amaryl   Hypokalemia Hypomagnesemia Resolved         Data Reviewed: Basic Metabolic Panel: Recent Labs  Lab 03/10/21 0826 03/12/21 0814  NA 134* 135  K 4.0 4.1  CL 101 100  CO2 25 27  GLUCOSE 247* 220*  BUN 8 12  CREATININE 0.72 0.79  CALCIUM 9.4 9.8   Liver Function Tests: Recent Labs  Lab 03/10/21 0826  AST 43*  ALT 38  ALKPHOS 73  BILITOT 0.6  PROT 6.7  ALBUMIN 3.0*   No results for input(s): LIPASE, AMYLASE in the last 168 hours. No results for input(s): AMMONIA in the last 168 hours. CBC: Recent Labs  Lab 03/14/21 0718  WBC 10.9*  HGB 12.4  HCT 38.4  MCV 91.9  PLT 361      CBG: Recent Labs  Lab 03/15/21 0729 03/15/21 0808 03/15/21 1212 03/15/21 1642 03/15/21 2108  GLUCAP 146* 201* 133* 126* 196*    Scheduled Meds:  enoxaparin (LOVENOX) injection  40 mg Subcutaneous Q24H   feeding supplement (GLUCERNA SHAKE)  237 mL Oral TID BM   free water  100 mL Per Tube Q8H   glimepiride  2 mg Oral Q breakfast   hydrocerin   Topical BID   insulin aspart  0-15 Units Subcutaneous TID WC   insulin aspart  0-5 Units Subcutaneous QHS   insulin aspart  8 Units Subcutaneous TID WC   insulin glargine-yfgn  30 Units Subcutaneous Daily   levETIRAcetam  1,000 mg Oral BID   loratadine  10 mg Oral Daily   LORazepam  0.5 mg Oral QAC breakfast   metFORMIN  500 mg Oral BID WC   metoprolol succinate  12.5 mg Oral Daily   multivitamin with minerals  1 tablet Oral Daily   risperiDONE  2 mg Oral QHS   valACYclovir  500 mg Oral QHS      Principal Problem:   Acute encephalopathy Active Problems:    Hypoglycemia   Constipation   Bipolar disorder (HCC)   Diabetes (HCC)   AMS (altered mental status)   Poor appetite   Psychogenic loss of appetite   Intellectual delay   Consultants: Neurology Psychiatry  Procedures: EEG Long-term EEG  Antibiotics: Valacyclovir 8/15 through 8/28   Time spent: 15 minutes    Junious Silk ANP  [facility] 7 am - 330 pm/M-F for direct patient care and secure chat Please refer to Amion for contact info 50  days

## 2021-03-16 NOTE — Progress Notes (Signed)
Physical Therapy Treatment Patient Details Name: [name] MRN: [id number] DOB: 01-02-1966 Today's Date: 03/16/2021   History of Present Illness Pt is a 55 y/o female admitted for neurological evaluation of suspected encephalopathy.  However, brain imaging was normal, EEG unremarkable, UA negative, urine drug screen negative, procalcitonin low, B12, ammonia, and TSH normal.  Acute metabolic encephalopathy was ruled out. Progressive mental decline over previous 2 days so another EEG and CT head performed 8/30 with normal results, no sign of seizures. Improving mental status 02/18/21. PMH: intellectual disability, bipolar disorder, seizure disorder, type II diabetes, hypertension, hyperlipidemia, immune deficiency disorder.    PT Comments    STAR PT session: Junious Silk, NP present to video pt's progress to send to sister to reinforce the pt's current level of function and appropriateness for discharge home. Pt is currently mod I for bed mobility and supervision for transfers from various surfaces and ambulation of over 1000 feet with safe use of Rollator. Pt continues to need minor cuing for safe navigation. Pt able to ascend and descend 19 steps with contact guard assist for safety only. Pt also exhibiting better short term memory, when asked orientation questions able to answer appropriately with minor help from sign in room. Pt also able to follow command for future action when ambulating pt asked to ambulate to a target and stop and she was able to accomplish 2/3 times. PT hopeful for PEG tube removal and safe discharge to home and her family in the next couple of days.     Recommendations for follow up therapy are one component of a multi-disciplinary discharge planning process, led by the attending physician.  Recommendations may be updated based on patient status, additional functional criteria and insurance authorization.  Follow Up Recommendations  Supervision/Assistance - 24 hour;No PT  follow up     Equipment Recommendations  Other (comment);3in1 (PT) (rollator (4 wheeled walker) with a seat, shower chair)       Precautions / Restrictions Precautions Precautions: Fall Precaution Comments: PEG tube Restrictions Weight Bearing Restrictions: No Other Position/Activity Restrictions: Increased alertness and very interactive     Mobility  Bed Mobility Overal bed mobility: Modified Independent             General bed mobility comments: able to come to EoB once, purewick removed    Transfers Overall transfer level: Needs assistance Equipment used: 4-wheeled walker Transfers: Sit to/from Stand Sit to Stand: Supervision         General transfer comment: supervision for transfers from bed, toilet and Rollator  Ambulation/Gait Ambulation/Gait assistance: Supervision Gait Distance (Feet): 1000 Feet Assistive device: 4-wheeled walker Gait Pattern/deviations: Step-through pattern;Shuffle Gait velocity: Decreased Gait velocity interpretation: 1.31 - 2.62 ft/sec, indicative of limited community ambulator General Gait Details: overall supervision for ambulation, continues to require minor cuing for problem solving, improved command follow   Stairs Stairs: Yes Stairs assistance: Min guard Stair Management: Two rails;Forwards;Alternating pattern Number of Stairs: 19 General stair comments: slow, steady ascent/descent with step over step pattern       Balance Overall balance assessment: Needs assistance Sitting-balance support: Feet supported Sitting balance-Leahy Scale: Fair     Standing balance support: No upper extremity supported;During functional activity Standing balance-Leahy Scale: Fair Standing balance comment: able to perform grooming tasks at sink without UE support                            Cognition Arousal/Alertness: Awake/alert Behavior During Therapy: Flat  affect (more animated today) Overall Cognitive Status:  Impaired/Different from baseline Area of Impairment: Orientation;Attention;Memory;Following commands;Safety/judgement;Awareness;Problem solving                 Orientation Level:  (pt able to use sign in room to clarify) Current Attention Level: Focused Memory: Decreased recall of precautions;Decreased short-term memory Following Commands: Follows one step commands consistently;Follows multi-step commands inconsistently;Follows one step commands with increased time Safety/Judgement: Decreased awareness of deficits;Decreased awareness of safety Awareness: Intellectual Problem Solving: Slow processing;Requires verbal cues;Requires tactile cues;Decreased initiation;Difficulty sequencing General Comments: pt's memory is improving, with very minor cuing for brake management with Rollator, also able to remember to stop on target 25 feet away, 2/3 times         General Comments General comments (skin integrity, edema, etc.): VSS on RA, pt bed pad noted to have BM, pt reports she needs to go and was able to walk to bathroom without assist, she sat on toilet for approx 5 min and was noted to have large stool lodged in rectum, NP in room attempted disimpaction      Pertinent Vitals/Pain Pain Assessment: Faces Faces Pain Scale: No hurt     PT Goals (current goals can now be found in the care plan section) Acute Rehab PT Goals Patient Stated Goal: Not stated PT Goal Formulation: With family Time For Goal Achievement: 03/24/21 Potential to Achieve Goals: Good Progress towards PT goals: Progressing toward goals    Frequency    Min 5X/week      PT Plan Current plan remains appropriate       AM-PAC PT "6 Clicks" Mobility   Outcome Measure  Help needed turning from your back to your side while in a flat bed without using bedrails?: None Help needed moving from lying on your back to sitting on the side of a flat bed without using bedrails?: None Help needed moving to and from a  bed to a chair (including a wheelchair)?: A Little Help needed standing up from a chair using your arms (e.g., wheelchair or bedside chair)?: A Little Help needed to walk in hospital room?: A Little Help needed climbing 3-5 steps with a railing? : A Little 6 Click Score: 20    End of Session Equipment Utilized During Treatment: Gait belt Activity Tolerance: Patient tolerated treatment well Patient left: in chair;with call bell/phone within reach;with chair alarm set Nurse Communication: Mobility status PT Visit Diagnosis: Other abnormalities of gait and mobility (R26.89);Other symptoms and signs involving the nervous system (X32.440)     Time: 1027-2536 PT Time Calculation (min) (ACUTE ONLY): 41 min  Charges:  $Gait Training: 23-37 mins $Therapeutic Activity: 8-22 mins                     Aum Caggiano B. Beverely Risen PT, DPT Acute Rehabilitation Services Pager [phone] Office [phone]    Elon Alas [facility] 03/16/2021, 9:32 AM

## 2021-03-16 NOTE — Progress Notes (Signed)
Nutrition Follow-up  DOCUMENTATION CODES:  Not applicable  INTERVENTION:  Please obtain new measured weight  -Continue Glucerna Shake po TID, each supplement provides 220 kcal and 10 grams of protein -Continue MVI with minerals daily  NUTRITION DIAGNOSIS:  Inadequate oral intake related to lethargy/confusion as evidenced by meal completion < 25%, per patient/family report. -- ongoing  GOAL:  Patient will meet greater than or equal to 90% of their needs -- progressing  MONITOR:  PO intake, Supplement acceptance, Labs, Weight trends, Skin, I & O's  REASON FOR ASSESSMENT:  Consult Enteral/tube feeding initiation and management  ASSESSMENT:  Pt with PMH significant of intellectual disability, bipolar disorder, seizure disorder, type 2 DM, HTN, HLD, immunodeficiency disorder presented to the ED for change in mental status as reported by patient's sister for the past couple of days at home. CT head, MRI brain, EEG, and other neurologic work-up have been negative. Abdominal x-ray showed moderate stool burden. Pt does have h/o constipation and was recommended by GI to take Linzess and Dulcolax, though pt has been noncompliant. Pt has outpatient colonoscopy scheduled.  8/15 Cortrak placed (gastric tip) 8/29 PEG placed   Pt's mentation has significantly improved. PO intake has also significantly improved. Pt has consumed 90-100% of last 8 documented meals and done well with oral nutrition supplements per RN (Glucerna TID). Pt in need of BM; NP aware and made attempt at disimpaction today. NP able to remove small to moderate volume of soft stool but noted significant amount of stool left in the rectum. NP ordered soapsuds enema x1, increased Miralax frequency, and increased senna docusate frequency.   Medications: glimepiride, SSI, novolog, semglee, glucophage, mvi with minerals, free water Q8H via tube, miralax, senokot-s Labs reviewed.  CBGs: 122-210 x24 hours (Diabetes Coordinator  following)  Weight needs to be updated as no weight taken since 9/21  UOP: 2.7L x24 hours I/O: +1.2L since admit  Diet Order:   Diet Order             Diet Carb Modified Fluid consistency: Thin; Room service appropriate? Yes  Diet effective now                  EDUCATION NEEDS:  Not appropriate for education at this time  Skin:  Skin Assessment: Reviewed RN Assessment  Last BM:  9/27  Height:  Ht Readings from Last 1 Encounters:  10/30/20 5\' 1"  (1.549 m)   Weight:  Wt Readings from Last 1 Encounters:  03/09/21 71.7 kg   BMI:  Body mass index is 29.87 kg/m.  Estimated Nutritional Needs:  Kcal:  1800-2000 Protein:  90-100 grams Fluid:  >1.8L/d    03/11/21, MS, RD, LDN (she/her/hers) RD pager number and weekend/on-call pager number located in Amion.

## 2021-03-16 NOTE — Progress Notes (Signed)
Occupational Therapy Treatment Patient Details Name: [name] MRN: [id number] DOB: August 30, 1965 Today's Date: 03/16/2021   History of present illness Pt is a 55 y/o female admitted for neurological evaluation of suspected encephalopathy.  However, brain imaging was normal, EEG unremarkable, UA negative, urine drug screen negative, procalcitonin low, B12, ammonia, and TSH normal.  Acute metabolic encephalopathy was ruled out. Progressive mental decline over previous 2 days so another EEG and CT head performed 8/30 with normal results, no sign of seizures. Improving mental status 02/18/21. PMH: intellectual disability, bipolar disorder, seizure disorder, type II diabetes, hypertension, hyperlipidemia, immune deficiency disorder.   OT comments  Pt seen in collaboration with COTA to assess progress and update goals. Pt is making good progress toward planned DC home with assistance of family. Unsure if pt was incontinent at baseline - discussed with COTA. If not, pt would benefit from from implementing a B/B program with staff and caregiver. If eligible, pt would benefit from follow up [place] to help transition home, will continue to follow acutely.    Recommendations for follow up therapy are one component of a multi-disciplinary discharge planning process, led by the attending physician.  Recommendations may be updated based on patient status, additional functional criteria and insurance authorization.    Follow Up Recommendations  Other (comment) (Supervision with mobility and ADL tasks) HHOT   Equipment Recommendations  3 in 1 bedside commode;Tub/shower seat    Recommendations for Other Services      Precautions / Restrictions Precautions Precautions: Fall Precaution Comments: PEG tube Restrictions Weight Bearing Restrictions: No Other Position/Activity Restrictions: Increased alertness and very interactive       Mobility Bed Mobility  Transfers Overall transfer level: Needs  assistance Equipment used: 4-wheeled walker Transfers: Sit to/from Stand Sit to Stand: Supervision         General transfer comment: vc for pushing up from chair and locking/unlocking brakes on rollator    Balance Overall balance assessment: Needs assistance Sitting-balance support: Feet supported Sitting balance-Leahy Scale: Good     Standing balance support: No upper extremity supported;During functional activity Standing balance-Leahy Scale: Fair Standing balance comment: able to perform grooming tasks at sink without UE support                           ADL either performed or assessed with clinical judgement   ADL                                         General ADL Comments: Requires mod VC for problem solving and task completion when stadning at sink for grooming adn bathing; no physical assist needed; No LOB noted     Vision       Perception     Praxis      Cognition Arousal/Alertness: Awake/alert Behavior During Therapy: Flat affect Overall Cognitive Status: No family/caregiver present to determine baseline cognitive functioning Area of Impairment: Orientation;Attention;Memory;Following commands;Safety/judgement;Awareness;Problem solving                 Orientation Level:  (pt able to use sign in room to clarify) Current Attention Level: Focused Memory: Decreased recall of precautions;Decreased short-term memory Following Commands: Follows one step commands consistently;Follows multi-step commands inconsistently;Follows one step commands with increased time Safety/Judgement: Decreased awareness of deficits;Decreased awareness of safety Awareness: Intellectual Problem Solving: Slow processing General Comments: Intellectual Disability at  baseline; follows 1 step commands consistently however requires vc for problem solving; unaware of being incontienent; decreased executive level skills        Exercises     Shoulder  Instructions       General Comments     Pertinent Vitals/ Pain       Pain Assessment: Faces Faces Pain Scale: No hurt  Home Living                                          Prior Functioning/Environment              Frequency  Min 5X/week (STAR program)        Progress Toward Goals  OT Goals(current goals can now be found in the care plan section)  Progress towards OT goals: Goals met and updated - see care plan  Acute Rehab OT Goals Patient Stated Goal: to brush her teeth OT Goal Formulation: With patient Time For Goal Achievement: 03/29/21 Potential to Achieve Goals: Good ADL Goals Pt Will Perform Grooming: with min guard assist;sitting Pt Will Perform Upper Body Bathing: with min assist;sitting Pt Will Perform Lower Body Bathing: with supervision;sit to/from stand Pt Will Perform Upper Body Dressing: with min assist;sitting Pt Will Perform Lower Body Dressing: with supervision;sit to/from stand Pt Will Transfer to Toilet: with modified independence Pt Will Perform Toileting - Clothing Manipulation and hygiene: with supervision;sitting/lateral leans Additional ADL Goal #1: Pt will complete grooming tasks in standing position at sink level with min vc Additional ADL Goal #2: Regis Bill will verbalize understanding of how to assist pt with ADL tasks to maximize safety and funcitonal level of independence Additional ADL Goal #3: Staff/caregiver will implement bowel/bladder program, toileting pt q 2 hrs when awake to increase awareness of bowel/bladder control adn reduce frequencies of incontinence episodes.  Plan Discharge plan remains appropriate;Frequency needs to be updated    Co-evaluation                 AM-PAC OT "6 Clicks" Daily Activity     Outcome Measure   Help from another person eating meals?: None Help from another person taking care of personal grooming?: A Little Help from another person toileting, which includes using toliet,  bedpan, or urinal?: A Little Help from another person bathing (including washing, rinsing, drying)?: A Little Help from another person to put on and taking off regular upper body clothing?: A Little Help from another person to put on and taking off regular lower body clothing?: A Little 6 Click Score: 19    End of Session Equipment Utilized During Treatment:  (rollator)  OT Visit Diagnosis: Other symptoms and signs involving cognitive function;Muscle weakness (generalized) (M62.81)   Activity Tolerance Patient tolerated treatment well   Patient Left Other (comment) (at sink with COTA)   Nurse Communication Other (comment) (COTA to communicate with nsg staff regarding bowe/bladder program)        Time: 7782-4235 OT Time Calculation (min): 10 min  Charges: OT General Charges $OT Visit: 1 Visit OT Treatments $Self Care/Home Management : 8-22 mins  Maurie Boettcher, OT/L   Acute OT Clinical Specialist [place] Pager [phone] Office [phone]   [facility] 03/16/2021, 10:15 AM

## 2021-03-17 DIAGNOSIS — G9340 Encephalopathy, unspecified: Secondary | ICD-10-CM | POA: Diagnosis not present

## 2021-03-17 LAB — GLUCOSE, CAPILLARY
Glucose-Capillary: 140 mg/dL — ABNORMAL HIGH (ref 70–99)
Glucose-Capillary: 199 mg/dL — ABNORMAL HIGH (ref 70–99)
Glucose-Capillary: 91 mg/dL (ref 70–99)
Glucose-Capillary: 139 mg/dL — ABNORMAL HIGH (ref 70–99)
Glucose-Capillary: 133 mg/dL — ABNORMAL HIGH (ref 70–99)

## 2021-03-17 NOTE — Progress Notes (Signed)
Occupational Therapy Treatment Patient Details Name: [name] MRN: [id number] DOB: [birth year] Today's Date: 03/17/2021   History of present illness Pt is a 55 y/o female admitted for neurological evaluation of suspected encephalopathy.  However, brain imaging was normal, EEG unremarkable, UA negative, urine drug screen negative, procalcitonin low, B12, ammonia, and TSH normal.  Acute metabolic encephalopathy was ruled out. Progressive mental decline over previous 2 days so another EEG and CT head performed 8/30 with normal results, no sign of seizures. Improving mental status 02/18/21. PMH: intellectual disability, bipolar disorder, seizure disorder, type II diabetes, hypertension, hyperlipidemia, immune deficiency disorder.   OT comments  Patient received in bed and asking to get up.  Patient ambulated to bathroom for toileting and required vcs for safety and min guard assist. Patient assisted with toilet hygiene and donned pull up brief with min assist.  Patient performed self care standing at sink with supervision to min guard assist for safety and cues for sequencing. Patient made frequent toilet visits and soiled brief.  Nursing notified on toileting. Patient to continue with STAR program to address self care and safety.    Recommendations for follow up therapy are one component of a multi-disciplinary discharge planning process, led by the attending physician.  Recommendations may be updated based on patient status, additional functional criteria and insurance authorization.    Follow Up Recommendations  Home health OT    Equipment Recommendations  3 in 1 bedside commode;Tub/shower seat    Recommendations for Other Services      Precautions / Restrictions Precautions Precautions: Fall Precaution Comments: PEG tube       Mobility Bed Mobility Overal bed mobility: Modified Independent             General bed mobility comments: able to perform bed mobility with vcs for  intiation    Transfers Overall transfer level: Needs assistance Equipment used: 4-wheeled walker Transfers: Sit to/from Stand Sit to Stand: Supervision;Min guard Stand pivot transfers: Supervision;Min guard       General transfer comment: min guard for tolet transfers and for safety    Balance Overall balance assessment: Needs assistance Sitting-balance support: Feet supported Sitting balance-Leahy Scale: Good Sitting balance - Comments: cleaned feet and donned footwear without loss of balance   Standing balance support: No upper extremity supported;During functional activity Standing balance-Leahy Scale: Fair Standing balance comment: able to perform self care tasks standing at sink                           ADL either performed or assessed with clinical judgement   ADL Overall ADL's : Needs assistance/impaired     Grooming: Wash/dry hands;Wash/dry face;Oral care;Applying deodorant;Supervision/safety;Cueing for sequencing;Standing;Min guard Grooming Details (indicate cue type and reason): able to initiate most tasks, required vcs for next step Upper Body Bathing: Supervision/ safety;Min guard;Standing;Cueing for sequencing   Lower Body Bathing: Sitting/lateral leans;Sit to/from stand;Supervison/ safety Lower Body Bathing Details (indicate cue type and reason): supervision, min guard when standing, bathed feet seated Upper Body Dressing : Minimal assistance;Standing Upper Body Dressing Details (indicate cue type and reason): donned gown seated Lower Body Dressing: Minimal assistance Lower Body Dressing Details (indicate cue type and reason): min assist to donn pull up brief Toilet Transfer: Min guard;Cueing for Health and safety inspector Details (indicate cue type and reason): verbal cues for rail use and safety Toileting- Clothing Manipulation and Hygiene: Minimal assistance;Cueing for sequencing Toileting - Clothing Manipulation Details (indicate cue  type  and reason): performed while standing     Functional mobility during ADLs: Supervision/safety;Min guard General ADL Comments: continues to require vcs for safety and sequencing     Vision       Perception     Praxis      Cognition Arousal/Alertness: Awake/alert Behavior During Therapy: Flat affect Overall Cognitive Status: No family/caregiver present to determine baseline cognitive functioning Area of Impairment: Orientation;Attention;Memory;Following commands;Safety/judgement;Awareness;Problem solving                 Orientation Level: Disoriented to;Place;Situation Current Attention Level: Focused Memory: Decreased recall of precautions;Decreased short-term memory Following Commands: Follows one step commands consistently;Follows multi-step commands inconsistently;Follows one step commands with increased time Safety/Judgement: Decreased awareness of deficits;Decreased awareness of safety Awareness: Intellectual Problem Solving: Slow processing General Comments: able to follow commands more consistantly        Exercises     Shoulder Instructions       General Comments      Pertinent Vitals/ Pain       Pain Assessment: No/denies pain  Home Living                                          Prior Functioning/Environment              Frequency  Min 5X/week        Progress Toward Goals  OT Goals(current goals can now be found in the care plan section)  Progress towards OT goals: Progressing toward goals  Acute Rehab OT Goals Patient Stated Goal: to get up OT Goal Formulation: With patient Time For Goal Achievement: 03/29/21 Potential to Achieve Goals: Good ADL Goals Pt Will Perform Grooming: with min guard assist;sitting Pt Will Perform Upper Body Bathing: with min assist;sitting Pt Will Perform Lower Body Bathing: with supervision;sit to/from stand Pt Will Perform Upper Body Dressing: with min assist;sitting Pt Will  Perform Lower Body Dressing: with supervision;sit to/from stand Pt Will Transfer to Toilet: with modified independence Pt Will Perform Toileting - Clothing Manipulation and hygiene: with supervision;sitting/lateral leans Additional ADL Goal #1: Pt will complete grooming tasks in standing position at sink level with min vc Additional ADL Goal #2: [name] will verbalize understanding of how to assist pt with ADL tasks to maximize safety and funcitonal level of independence Additional ADL Goal #3: Staff/caregiver will implement bowel/bladder program, toileting pt q 2 hrs when awake to increase awareness of bowel/bladder control adn reduce frequencies of incontinence episodes.  Plan Discharge plan remains appropriate;Frequency needs to be updated    Co-evaluation                 AM-PAC OT "6 Clicks" Daily Activity     Outcome Measure   Help from another person eating meals?: None Help from another person taking care of personal grooming?: A Little Help from another person toileting, which includes using toliet, bedpan, or urinal?: A Little Help from another person bathing (including washing, rinsing, drying)?: A Little Help from another person to put on and taking off regular upper body clothing?: A Little Help from another person to put on and taking off regular lower body clothing?: A Little 6 Click Score: 19    End of Session Equipment Utilized During Treatment: Other (comment) (rollator walker)  OT Visit Diagnosis: Other symptoms and signs involving cognitive function;Muscle weakness (generalized) (M62.81) Pain - part of body: Arm;Shoulder;Leg;Ankle and joints  of foot;Knee   Activity Tolerance Patient tolerated treatment well   Patient Left in chair;with call bell/phone within reach;with chair alarm set   Nurse Communication Other (comment) (discussed multiple BMs and need for toileting)        Time: 8921-1941 OT Time Calculation (min): 46 min  Charges: OT General  Charges $OT Visit: 1 Visit OT Treatments $Self Care/Home Management : 38-52 mins  Alfonse Flavors, OTA   Dewain Penning 03/17/2021, 8:33 AM

## 2021-03-17 NOTE — Progress Notes (Signed)
[facility] PROGRESS NOTE  [name] YSA:[id number] DOB: [birth year] DOA: 01/23/2021 PCP: Mirna Mires, MD  Status: Remains inpatient appropriate because:Altered mental status, Unsafe d/c plan, IV treatments appropriate due to intensity of illness or inability to take PO, and Inpatient level of care appropriate due to severity of illness  Dispo: The patient is from: Home              Anticipated d/c is to: Home: Sister who states she can provide 24/7 care with assistance of family-still attempting to get in contact with sister to confirm 24/7 care is definitely in place              Patient currently is not medically stable to d/c.Marland Kitchen  Now that she has awakened need to ensure that patient can mobilize appropriately and navigate stairs before discharge home.   Difficult to place patient Yes   Level of care: Med-Surg  Code Status: Full Family Communication: Sister [name]-9/26 and 9/27 secure text messages as well as videos of patient participating in therapy sent to sister.  She did not reply. DVT prophylaxis: Lovenox COVID vaccination status: Unknown   HPI: 55 y.o. F with DM, HTN, developmental delay who presented with decreased oral intake and "saying bizarre things".   In the ER, CT head unremarkable.  She was hypoglycemic and otherwise had normal chemistries and hgb  Admitted for evaluation of erratic behavior.  Subjective: Sitting up in chair.  Confirms she got soapsuds enema and had a lot of bowel movements afterwards.  Objective: Vitals:   03/17/21 0003 03/17/21 0427  BP: 117/73 113/66  Pulse: 99 96  Resp: 19 18  Temp: 97.8 F (36.6 C) (!) 97.4 F (36.3 C)  SpO2: 99% 99%    Intake/Output Summary (Last 24 hours) at 03/17/2021 0748 Last data filed at 03/16/2021 1630 Gross per 24 hour  Intake 650 ml  Output 400 ml  Net 250 ml   Filed Weights   02/20/21 0432 02/22/21 0651 03/09/21 0612  Weight: 73.5 kg 71.6 kg 71.7 kg    Exam: Constitutional: NAD,  alert Respiratory: Clear to auscultation, no increased work of breathing, stable on room air Cardiac: S1-S2, no peripheral edema, regular pulse and normotensive Abdomen: PEG tube site unremarkable -LBM 9/29 abdomen soft and nontender  Neurologic: Cranial nerves II through XII are intact.  Patient moving all extremities equally with strength 4-5/5.  Sensation intact. 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    Physical deconditioning  PT/OT aware of discharge plan to home.  Focus will be on stair navigation and increasing mobility Patient now able to navigate up to 19 stairs with minimal assist.  Still needs 24/7 care in regards to impulsivity.     Diabetes mellitus 2 uncontrolled on long-term insulin Continue Semglee and SSI Prior to admission patient was on Lantus 40 units HS (we are using Semglee ) Continue metformin and Amaryl  Hypokalemia Hypomagnesemia Resolved         Data Reviewed: Basic Metabolic Panel: Recent Labs  Lab 03/10/21 0826 03/12/21 0814  NA 134* 135  K 4.0 4.1  CL 101 100  CO2 25 27  GLUCOSE 247* 220*  BUN 8 12  CREATININE 0.72 0.79  CALCIUM 9.4 9.8   Liver Function Tests: Recent Labs  Lab 03/10/21 0826  AST 43*  ALT  38  ALKPHOS 73  BILITOT 0.6  PROT 6.7  ALBUMIN 3.0*   No results for input(s): LIPASE, AMYLASE in the last 168 hours. No results for input(s): AMMONIA in the last 168 hours. CBC: Recent Labs  Lab 03/14/21 0718  WBC 10.9*  HGB 12.4  HCT 38.4  MCV 91.9  PLT 361      CBG: Recent Labs  Lab 03/16/21 0753 03/16/21 1150 03/16/21 1529 03/16/21 2049 03/17/21 0623  GLUCAP 187* 210* 179* 165* 140*    Scheduled Meds:  enoxaparin (LOVENOX) injection  40 mg Subcutaneous Q24H   feeding supplement (GLUCERNA SHAKE)  237 mL Oral TID BM   free water  100 mL Per Tube Q8H   glimepiride  2 mg Oral Q breakfast   hydrocerin   Topical BID   insulin aspart  0-15 Units Subcutaneous TID WC   insulin aspart  0-5 Units Subcutaneous QHS   insulin aspart  8 Units Subcutaneous TID WC   insulin glargine-yfgn  30 Units Subcutaneous Daily   levETIRAcetam  1,000 mg Oral BID   loratadine  10 mg Oral Daily   LORazepam  0.5 mg Oral QAC breakfast   metFORMIN  500 mg Oral BID WC   metoprolol succinate  12.5 mg Oral Daily   multivitamin with minerals  1 tablet Oral Daily   polyethylene glycol  17 g Oral BID   risperiDONE  2 mg Oral QHS   senna-docusate  1 tablet Oral BID   valACYclovir  500 mg Oral QHS      Principal Problem:   Acute encephalopathy Active Problems:   Hypoglycemia   Constipation   Bipolar disorder (HCC)   Diabetes (HCC)   AMS (altered mental status)   Poor appetite   Psychogenic loss of appetite   Intellectual delay   Consultants: Neurology Psychiatry  Procedures: EEG Long-term EEG  Antibiotics: Valacyclovir 8/15 through 8/28   Time spent: 15 minutes    Junious Silk ANP  [facility] 7 am - 330 pm/M-F for direct patient care and secure chat Please refer to Amion for contact info 51  days

## 2021-03-17 NOTE — Progress Notes (Signed)
Speech Language Pathology Treatment: Cognitive-Linquistic  Patient Details Name: [name] MRN: [id number] DOB: [birth year] Today's Date: 03/17/2021 Time: 7322-0254 SLP Time Calculation (min) (ACUTE ONLY): 23 min  Assessment / Plan / Recommendation Clinical Impression  Pt seen for cognitive/linguistic treatment for aphasia, specifically responsive/confrontational naming with pt able to recall items within a simple category with min phonemic/semantic cues given and 80% accuracy obtained.    Pt's overall processing of information improved as she responded to personal information and orientation questions more readily this session. She independently oriented herself with environmental cues (whiteboard) within room to voice time/date.  Pt able to complete word fluency task with min cueing for category with increased number of responses from prior session.  Confrontational naming with 90% accuracy for simple environmental items.  Pt's articulation improved as well as dysarthria appears to be resolved within simple conversational tasks and judged to be 90% accurate overall.  Pt appearing to be close to baseline level of functioning.  ST will f/u with family and determine if further tx required in acute setting.    HPI HPI: 55 y.o. female with medical history significant of intellectual disability, bipolar disorder, seizure disorder, type II diabetes, hypertension, hyperlipidemia, immune deficiency disorder presenting to the ED on 01/23/21 for evaluation of altered mental status.  Patient's sister reported that she has not been eating for the past few days and has been saying things which are bizarre.  Reported slurred speech since yesterday.  Also reporting abdominal pain and no bowel movement in 2 weeks despite being given bisacodyl.  No witnessed seizure.  In the ED, slightly tachycardic on arrival, heart rate subsequently improved.  No fever.  Labs showing WBC 9.2, hemoglobin 13.7, platelet count 442.   Sodium 138, potassium 3.5, chloride 102, bicarb 23, BUN 11, creatinine 0.9, glucose 81.  Lipase and LFTs normal.  UA pending.  Head CT negative for acute finding.  Chest x-ray showing no active disease.  Abdominal x-ray showing moderate stool burden and nonobstructive bowel gas pattern.  Her blood glucose dropped to 61 in the ED and she was given IV dextrose; ST evaluated for BSE and s/o previously this hospitalization; SLE ordered to assess current mentation/speech.      SLP Plan  Continue with current plan of care      Recommendations for follow up therapy are one component of a multi-disciplinary discharge planning process, led by the attending physician.  Recommendations may be updated based on patient status, additional functional criteria and insurance authorization.    Recommendations                   Follow up Recommendations: Other (comment) (TBD) SLP Visit Diagnosis: Aphasia (R47.01);Cognitive communication deficit (R41.841) Plan: Continue with current plan of care                       Tressie Stalker, M.S., CCC-SLP  03/17/2021, 11:12 AM

## 2021-03-17 NOTE — Progress Notes (Signed)
Physical Therapy Treatment Patient Details Name: [name] MRN: [id number] DOB: 03/17/1966 Today's Date: 03/17/2021   History of Present Illness Pt is a 55 y/o female admitted for neurological evaluation of suspected encephalopathy.  However, brain imaging was normal, EEG unremarkable, UA negative, urine drug screen negative, procalcitonin low, B12, ammonia, and TSH normal.  Acute metabolic encephalopathy was ruled out. Progressive mental decline over previous 2 days so another EEG and CT head performed 8/30 with normal results, no sign of seizures. Improving mental status 02/18/21. PMH: intellectual disability, bipolar disorder, seizure disorder, type II diabetes, hypertension, hyperlipidemia, immune deficiency disorder.    PT Comments    STAR PT session: Pt continues to improve with endurance, strength and cognition working with various rehab disciplines. Pt is mod I for bed mobility and supervision for transfers and long distance ambulation. Pt able to work on dual tasking while walking looking for room numbers with maximal cuing. Pt cued for returning to room after having bout of gas to try to use bathroom. Pt successful with bowel movement and urination prior to return to ambulation. Pt resumed ambulation with use of Rollator for safety with seated rest breaks. Pt with visitors in room on return, assisted in set up for lunch. D/c plans remain appropriate. PT will see for STAR program again tomorrow and then reassess for return to regular PT frequency given high level of function.    Recommendations for follow up therapy are one component of a multi-disciplinary discharge planning process, led by the attending physician.  Recommendations may be updated based on patient status, additional functional criteria and insurance authorization.  Follow Up Recommendations  Supervision/Assistance - 24 hour;No PT follow up     Equipment Recommendations  Other (comment);3in1 (PT) (rollator (4 wheeled  walker) with a seat, shower chair)       Precautions / Restrictions Precautions Precautions: Fall Precaution Comments: PEG tube Restrictions Weight Bearing Restrictions: No     Mobility  Bed Mobility Overal bed mobility: Modified Independent             General bed mobility comments: able to come to EoB without assist    Transfers Overall transfer level: Needs assistance Equipment used: 4-wheeled walker Transfers: Sit to/from Stand Sit to Stand: Supervision         General transfer comment: supervision for transfers from bed, BSC and Rollator  Ambulation/Gait Ambulation/Gait assistance: Supervision Gait Distance (Feet): 1000 Feet Assistive device: 4-wheeled walker;None Gait Pattern/deviations: Step-through pattern;Shuffle Gait velocity: Decreased Gait velocity interpretation: 1.31 - 2.62 ft/sec, indicative of limited community ambulator General Gait Details: overall supervision for ambulation, continues to require minor cuing for problem solving, improved command follow, practiced 2x seated rest breaks able to ambulate in room without AD and fair balance.         Balance Overall balance assessment: Needs assistance Sitting-balance support: Feet supported Sitting balance-Leahy Scale: Good     Standing balance support: No upper extremity supported;During functional activity Standing balance-Leahy Scale: Fair Standing balance comment: able to perform grooming tasks at sink without UE support                            Cognition Arousal/Alertness: Awake/alert Behavior During Therapy: Flat affect Overall Cognitive Status: Impaired/Different from baseline Area of Impairment: Orientation;Attention;Memory;Following commands;Safety/judgement;Awareness;Problem solving                 Orientation Level:  (pt able to use sign in room to clarify) Current  Attention Level: Focused Memory: Decreased recall of precautions;Decreased short-term  memory Following Commands: Follows one step commands consistently;Follows multi-step commands inconsistently;Follows one step commands with increased time Safety/Judgement: Decreased awareness of deficits;Decreased awareness of safety Awareness: Intellectual Problem Solving: Slow processing;Requires verbal cues;Requires tactile cues;Decreased initiation;Difficulty sequencing General Comments: continues to have better command follow and some carryover for safety with Rollator         General Comments General comments (skin integrity, edema, etc.): VSS on RA, pt with bout of gas, requested pt return to room to try to use bathroom given multiple instances of incontinence of bowels today. Pt able to urinate and had small BM. Pt requires assist for throrough pericare. able to wash hands independently      Pertinent Vitals/Pain Pain Assessment: No/denies pain Faces Pain Scale: No hurt     PT Goals (current goals can now be found in the care plan section) Acute Rehab PT Goals Patient Stated Goal: Not stated PT Goal Formulation: With family Time For Goal Achievement: 03/24/21 Potential to Achieve Goals: Good Progress towards PT goals: Progressing toward goals    Frequency    Min 5X/week      PT Plan Current plan remains appropriate       AM-PAC PT "6 Clicks" Mobility   Outcome Measure  Help needed turning from your back to your side while in a flat bed without using bedrails?: None Help needed moving from lying on your back to sitting on the side of a flat bed without using bedrails?: None Help needed moving to and from a bed to a chair (including a wheelchair)?: A Little Help needed standing up from a chair using your arms (e.g., wheelchair or bedside chair)?: A Little Help needed to walk in hospital room?: A Little Help needed climbing 3-5 steps with a railing? : A Little 6 Click Score: 20    End of Session Equipment Utilized During Treatment: Gait belt Activity  Tolerance: Patient tolerated treatment well Patient left: in chair;with call bell/phone within reach;with chair alarm set;with family/visitor present;Other (comment) (set up for lunch) Nurse Communication: Mobility status PT Visit Diagnosis: Other abnormalities of gait and mobility (R26.89);Other symptoms and signs involving the nervous system (O70.962)     Time: 8366-2947 PT Time Calculation (min) (ACUTE ONLY): 34 min  Charges:                        [name] PT, DPT Acute Rehabilitation Services Pager [phone] Office [phone]    [name] [facility] 03/17/2021, 12:47 PM

## 2021-03-18 DIAGNOSIS — G9340 Encephalopathy, unspecified: Secondary | ICD-10-CM | POA: Diagnosis not present

## 2021-03-18 LAB — GLUCOSE, CAPILLARY
Glucose-Capillary: 102 mg/dL — ABNORMAL HIGH (ref 70–99)
Glucose-Capillary: 112 mg/dL — ABNORMAL HIGH (ref 70–99)
Glucose-Capillary: 139 mg/dL — ABNORMAL HIGH (ref 70–99)
Glucose-Capillary: 60 mg/dL — ABNORMAL LOW (ref 70–99)
Glucose-Capillary: 83 mg/dL (ref 70–99)

## 2021-03-18 NOTE — Progress Notes (Signed)
[facility] PROGRESS NOTE  [name] KGU:[id number] DOB: 03-02-1966 DOA: 01/23/2021 PCP: Mirna Mires, MD  Status: Remains inpatient appropriate because:Altered mental status, Unsafe d/c plan, IV treatments appropriate due to intensity of illness or inability to take PO, and Inpatient level of care appropriate due to severity of illness  Dispo: The patient is from: Home              Anticipated d/c is to: Home: Sister who states she can provide 24/7 care with assistance of family-still attempting to get in contact with sister to confirm 24/7 care is definitely in place              Patient currently is not medically stable to d/c.Marland Kitchen  She has improved significantly with mobility but is not eligible for home health services therefore will benefit from continued STAR/aggressive therapies prior to discharge   Difficult to place patient Yes   Level of care: Med-Surg  Code Status: Full Family Communication: Sister [name]-9/26 and 9/27 secure text messages as well as videos of patient participating in therapy sent to sister.  She did not reply. DVT prophylaxis: Lovenox COVID vaccination status: Unknown   HPI: 55 y.o. F with DM, HTN, developmental delay who presented with decreased oral intake and "saying bizarre things".   In the ER, CT head unremarkable.  She was hypoglycemic and otherwise had normal chemistries and hgb  Admitted for evaluation of erratic behavior.  Subjective: Awake and alert and playing with her stuffed goat while sitting in the bed.  No complaints.  Objective: Vitals:   03/17/21 2304 03/18/21 0433  BP: 128/90 101/61  Pulse: (!) 104 (!) 107  Resp: 17 15  Temp: 98.5 F (36.9 C) 98.4 F (36.9 C)  SpO2: 99% 96%    Intake/Output Summary (Last 24 hours) at 03/18/2021 0800 Last data filed at 03/18/2021 6283 Gross per 24 hour  Intake 240 ml  Output 950 ml  Net -710 ml   Filed Weights   02/20/21 0432 02/22/21 0651 03/09/21 0612  Weight: 73.5 kg 71.6 kg  71.7 kg    Exam: Constitutional: NAD, alert Respiratory: Clear to auscultation stable on room air with normal oximetry Cardiac: S1-S2, no peripheral edema, regular pulse and normotensive Abdomen: PEG tube site unremarkable -LBM 9/29 abdomen soft and nontender  Neurologic: Cranial nerves II through XII are intact.  Patient moving all extremities equally with strength 4-5/5.  Sensation intact. 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    Physical deconditioning  PT/OT aware of discharge plan to home.  Focus will be on stair navigation and increasing mobility Patient now able to navigate up to 19 stairs with minimal assist.  Still needs 24/7 care in regards to impulsivity.   Also some of her mobility and executive functioning skills need to improve before discharge noting she is not eligible for home health services because she has Medicaid   Diabetes  mellitus 2 uncontrolled on long-term insulin Continue Semglee and SSI Prior to admission patient was on Lantus 40 units HS (we are using Semglee ) Continue metformin and Amaryl   Hypokalemia Hypomagnesemia Resolved         Data Reviewed: Basic Metabolic Panel: Recent Labs  Lab 03/12/21 0814  NA 135  K  4.1  CL 100  CO2 27  GLUCOSE 220*  BUN 12  CREATININE 0.79  CALCIUM 9.8   Liver Function Tests: No results for input(s): AST, ALT, ALKPHOS, BILITOT, PROT, ALBUMIN in the last 168 hours.  No results for input(s): LIPASE, AMYLASE in the last 168 hours. No results for input(s): AMMONIA in the last 168 hours. CBC: Recent Labs  Lab 03/14/21 0718  WBC 10.9*  HGB 12.4  HCT 38.4  MCV 91.9  PLT 361      CBG: Recent Labs  Lab 03/17/21 1130 03/17/21 1544 03/17/21 1808 03/17/21 2125 03/18/21 0622  GLUCAP 199* 91 139* 133* 102*    Scheduled Meds:  enoxaparin (LOVENOX) injection  40 mg Subcutaneous Q24H   feeding supplement (GLUCERNA SHAKE)  237 mL Oral TID BM   free water  100 mL Per Tube Q8H   glimepiride  2 mg Oral Q breakfast   hydrocerin   Topical BID   insulin aspart  0-15 Units Subcutaneous TID WC   insulin aspart  0-5 Units Subcutaneous QHS   insulin aspart  8 Units Subcutaneous TID WC   insulin glargine-yfgn  30 Units Subcutaneous Daily   levETIRAcetam  1,000 mg Oral BID   loratadine  10 mg Oral Daily   LORazepam  0.5 mg Oral QAC breakfast   metFORMIN  500 mg Oral BID WC   metoprolol succinate  12.5 mg Oral Daily   multivitamin with minerals  1 tablet Oral Daily   polyethylene glycol  17 g Oral BID   risperiDONE  2 mg Oral QHS   senna-docusate  1 tablet Oral BID   valACYclovir  500 mg Oral QHS      Principal Problem:   Acute encephalopathy Active Problems:   Hypoglycemia   Constipation   Bipolar disorder (HCC)   Diabetes (HCC)   AMS (altered mental status)   Poor appetite   Psychogenic loss of appetite   Intellectual delay   Consultants: Neurology Psychiatry  Procedures: EEG Long-term EEG  Antibiotics: Valacyclovir 8/15 through 8/28   Time spent: 15 minutes    Junious Silk ANP  [facility] 7 am - 330 pm/M-F for direct patient care and secure chat Please refer to Amion for contact info 52  days

## 2021-03-18 NOTE — Progress Notes (Signed)
Occupational Therapy Treatment Patient Details Name: [name] MRN: [id number] DOB: 05-Dec-1965 Today's Date: 03/18/2021   History of present illness Pt is a 55 y/o female admitted for neurological evaluation of suspected encephalopathy.  However, brain imaging was normal, EEG unremarkable, UA negative, urine drug screen negative, procalcitonin low, B12, ammonia, and TSH normal.  Acute metabolic encephalopathy was ruled out. Progressive mental decline over previous 2 days so another EEG and CT head performed 8/30 with normal results, no sign of seizures. Improving mental status 02/18/21. PMH: intellectual disability, bipolar disorder, seizure disorder, type II diabetes, hypertension, hyperlipidemia, immune deficiency disorder.   OT comments  Patient seen by skilled OT to address functional mobility, safety, grooming, and toilet transfers and hygiene. Patient continues to require frequent cues for safety/locking brakes and sequencing. Patient continues to make good progress with OT and will continue to be seen by acute OT.    Recommendations for follow up therapy are one component of a multi-disciplinary discharge planning process, led by the attending physician.  Recommendations may be updated based on patient status, additional functional criteria and insurance authorization.    Follow Up Recommendations  Home health OT    Equipment Recommendations  3 in 1 bedside commode;Tub/shower seat    Recommendations for Other Services      Precautions / Restrictions Precautions Precautions: Fall Precaution Comments: PEG tube Restrictions Weight Bearing Restrictions: No       Mobility Bed Mobility Overal bed mobility: Modified Independent             General bed mobility comments: up in recliner    Transfers Overall transfer level: Needs assistance Equipment used: 4-wheeled walker Transfers: Sit to/from Stand Sit to Stand: Supervision Stand pivot transfers: Supervision;Min  guard       General transfer comment: requires cues for safety with toilet transfers and cues to use rollator correctly    Balance Overall balance assessment: Needs assistance Sitting-balance support: Feet supported Sitting balance-Leahy Scale: Good     Standing balance support: No upper extremity supported;During functional activity Standing balance-Leahy Scale: Fair Standing balance comment: able to stand for toileting                           ADL either performed or assessed with clinical judgement   ADL Overall ADL's : Needs assistance/impaired     Grooming: Wash/dry hands;Wash/dry face;Supervision/safety;Standing Grooming Details (indicate cue type and reason): continues to require vcs for sequecing                 Toilet Transfer: Min guard;Cueing for Health and safety inspector Details (indicate cue type and reason): verbal cues for rail use and safety Toileting- Clothing Manipulation and Hygiene: Minimal assistance;Cueing for sequencing Toileting - Clothing Manipulation Details (indicate cue type and reason): requires assistance to complete       General ADL Comments: needs reminding to use bathroom     Vision       Perception     Praxis      Cognition Arousal/Alertness: Awake/alert Behavior During Therapy: Flat affect Overall Cognitive Status: Impaired/Different from baseline Area of Impairment: Orientation;Attention;Memory;Following commands;Safety/judgement;Awareness;Problem solving                 Orientation Level: Disoriented to;Place;Situation Current Attention Level: Focused Memory: Decreased recall of precautions;Decreased short-term memory Following Commands: Follows one step commands consistently;Follows multi-step commands inconsistently;Follows one step commands with increased time Safety/Judgement: Decreased awareness of deficits;Decreased awareness of safety Awareness: Intellectual  Problem Solving: Slow  processing;Requires verbal cues;Requires tactile cues;Decreased initiation;Difficulty sequencing General Comments: requires cues to use bathroom        Exercises     Shoulder Instructions       General Comments VSS on RA, pt with successful use of bathroom, but requires cuing after ambulation as she does not feel like she needs to go    Pertinent Vitals/ Pain       Pain Assessment: No/denies pain Faces Pain Scale: No hurt  Home Living                                          Prior Functioning/Environment              Frequency           Progress Toward Goals  OT Goals(current goals can now be found in the care plan section)  Progress towards OT goals: Progressing toward goals  Acute Rehab OT Goals Patient Stated Goal: Not stated OT Goal Formulation: With patient Time For Goal Achievement: 03/29/21 Potential to Achieve Goals: Good ADL Goals Pt Will Perform Grooming: with min guard assist;sitting Pt Will Perform Upper Body Bathing: with min assist;sitting Pt Will Perform Lower Body Bathing: with supervision;sit to/from stand Pt Will Perform Upper Body Dressing: with min assist;sitting Pt Will Perform Lower Body Dressing: with supervision;sit to/from stand Pt Will Transfer to Toilet: with modified independence Pt Will Perform Toileting - Clothing Manipulation and hygiene: with supervision;sitting/lateral leans Additional ADL Goal #1: Pt will complete grooming tasks in standing position at sink level with min vc Additional ADL Goal #2: [name] will verbalize understanding of how to assist pt with ADL tasks to maximize safety and funcitonal level of independence Additional ADL Goal #3: Staff/caregiver will implement bowel/bladder program, toileting pt q 2 hrs when awake to increase awareness of bowel/bladder control adn reduce frequencies of incontinence episodes.  Plan Discharge plan remains appropriate;Frequency needs to be updated     Co-evaluation                 AM-PAC OT "6 Clicks" Daily Activity     Outcome Measure   Help from another person eating meals?: None Help from another person taking care of personal grooming?: A Little Help from another person toileting, which includes using toliet, bedpan, or urinal?: A Little Help from another person bathing (including washing, rinsing, drying)?: A Little Help from another person to put on and taking off regular upper body clothing?: A Little Help from another person to put on and taking off regular lower body clothing?: A Little 6 Click Score: 19    End of Session Equipment Utilized During Treatment: Rolling walker  OT Visit Diagnosis: Other symptoms and signs involving cognitive function;Muscle weakness (generalized) (M62.81)   Activity Tolerance Patient tolerated treatment well   Patient Left in chair;with call bell/phone within reach;with chair alarm set   Nurse Communication Mobility status        Time: 8338-2505 OT Time Calculation (min): 30 min  Charges: OT General Charges $OT Visit: 1 Visit OT Treatments $Self Care/Home Management : 23-37 mins  Alfonse Flavors, OTA   Natayah Warmack Jeannett Senior 03/18/2021, 4:05 PM

## 2021-03-18 NOTE — Progress Notes (Signed)
Hypoglycemic Event  CBG: 60 at 2208  Treatment: 8 oz juice/soda, crackers, glucerna drink  Symptoms: None  Follow-up CBG: Time:2234 CBG Result:83  Possible Reasons for Event: Unknown  Comments/MD notified:Dr Nolene Ebbs, Trayveon Beckford Efe

## 2021-03-18 NOTE — Progress Notes (Signed)
Physical Therapy Treatment Patient Details Name: [name] MRN: [id number] DOB: 03-Nov-1965 Today's Date: 03/18/2021   History of Present Illness Pt is a 55 y/o female admitted for neurological evaluation of suspected encephalopathy.  However, brain imaging was normal, EEG unremarkable, UA negative, urine drug screen negative, procalcitonin low, B12, ammonia, and TSH normal.  Acute metabolic encephalopathy was ruled out. Progressive mental decline over previous 2 days so another EEG and CT head performed 8/30 with normal results, no sign of seizures. Improving mental status 02/18/21. PMH: intellectual disability, bipolar disorder, seizure disorder, type II diabetes, hypertension, hyperlipidemia, immune deficiency disorder.    PT Comments    [facility] PT session: Pt supine in bed with lunch tray over her. Pt reports she is finished with her lunch. Pt eager to walk with therapy, and begins to get up from bed. When she pulls Purewick out from between her legs found to be soiled with stool. When asked pt reports she did not know she had gone to the bathroom. Pt requires max A for cleaning up prior to ambulation. Pt is mod I for bed mobility, supervision for transfers and ambulation and min A for ascent/descent of 19 steps. Pt with increased distraction today, less carryover of safety with RW and increased cuing for navigation. Pt is likely at her baseline level of cognition with some days better than others. Pt mobility has vastly improved over the week. Based on assessment today of mobility, pt to be returned to regular frequency of rehab services PT 3x/wk (MWF), OT (T,TH) 2x/wk.      Recommendations for follow up therapy are one component of a multi-disciplinary discharge planning process, led by the attending physician.  Recommendations may be updated based on patient status, additional functional criteria and insurance authorization.  Follow Up Recommendations  Supervision/Assistance - 24 hour;No PT  follow up     Equipment Recommendations  Other (comment);3in1 (PT) (rollator (4 wheeled walker) with a seat, shower chair)       Precautions / Restrictions Precautions Precautions: Fall Precaution Comments: PEG tube Restrictions Weight Bearing Restrictions: No     Mobility  Bed Mobility Overal bed mobility: Modified Independent             General bed mobility comments: up in recliner    Transfers Overall transfer level: Needs assistance Equipment used: 4-wheeled walker Transfers: Sit to/from Stand Sit to Stand: Supervision Stand pivot transfers: Supervision;Min guard       General transfer comment: requires cues for safety with toilet transfers and cues to use rollator correctly  Ambulation/Gait Ambulation/Gait assistance: Supervision Gait Distance (Feet): 2000 Feet Assistive device: 4-wheeled walker;None Gait Pattern/deviations: Step-through pattern;Shuffle Gait velocity: Decreased Gait velocity interpretation: 1.31 - 2.62 ft/sec, indicative of limited community ambulator General Gait Details: overall supervision for ambulation, continues to require cuing for problem solving, pt with increased distraction today requiring increased cuing for command follow, especially with directions.   Stairs Stairs: Yes Stairs assistance: Min assist Stair Management: Two rails;Forwards Number of Stairs: 19 General stair comments: light min A for safety, requires increased cuing for use of bilateral handrails,          Balance Overall balance assessment: Needs assistance Sitting-balance support: Feet supported Sitting balance-Leahy Scale: Good     Standing balance support: No upper extremity supported;During functional activity Standing balance-Leahy Scale: Fair Standing balance comment: able to stand for toileting  Cognition Arousal/Alertness: Awake/alert Behavior During Therapy: Flat affect Overall Cognitive Status:  Impaired/Different from baseline Area of Impairment: Orientation;Attention;Memory;Following commands;Safety/judgement;Awareness;Problem solving                 Orientation Level: Disoriented to;Place;Situation Current Attention Level: Focused Memory: Decreased recall of precautions;Decreased short-term memory Following Commands: Follows one step commands consistently;Follows multi-step commands inconsistently;Follows one step commands with increased time Safety/Judgement: Decreased awareness of deficits;Decreased awareness of safety Awareness: Intellectual Problem Solving: Slow processing;Requires verbal cues;Requires tactile cues;Decreased initiation;Difficulty sequencing General Comments: requires cues to use bathroom         General Comments General comments (skin integrity, edema, etc.): VSS on RA, pt with successful use of bathroom, but requires cuing after ambulation as she does not feel like she needs to go      Pertinent Vitals/Pain Pain Assessment: No/denies pain Faces Pain Scale: No hurt     PT Goals (current goals can now be found in the care plan section) Acute Rehab PT Goals Patient Stated Goal: Not stated PT Goal Formulation: With family Time For Goal Achievement: 03/24/21 Potential to Achieve Goals: Good Progress towards PT goals: Progressing toward goals    Frequency    Min 3X/week      PT Plan Current plan remains appropriate       AM-PAC PT "6 Clicks" Mobility   Outcome Measure  Help needed turning from your back to your side while in a flat bed without using bedrails?: None Help needed moving from lying on your back to sitting on the side of a flat bed without using bedrails?: None Help needed moving to and from a bed to a chair (including a wheelchair)?: A Little Help needed standing up from a chair using your arms (e.g., wheelchair or bedside chair)?: A Little Help needed to walk in hospital room?: A Little Help needed climbing 3-5 steps  with a railing? : A Little 6 Click Score: 20    End of Session Equipment Utilized During Treatment: Gait belt Activity Tolerance: Patient tolerated treatment well Patient left: Other (comment) (with OT finishing toileting) Nurse Communication: Mobility status PT Visit Diagnosis: Other abnormalities of gait and mobility (R26.89);Other symptoms and signs involving the nervous system (G83.662)     Time: 9476-5465 PT Time Calculation (min) (ACUTE ONLY): 38 min  Charges:  $Gait Training: 38-52 mins                     Idrissa Beville B. Beverely Risen PT, DPT Acute Rehabilitation Services Pager [phone] Office [phone]    Elon Alas Fleet 03/18/2021, 4:09 PM

## 2021-03-19 DIAGNOSIS — G9340 Encephalopathy, unspecified: Secondary | ICD-10-CM | POA: Diagnosis not present

## 2021-03-19 LAB — GLUCOSE, CAPILLARY
Glucose-Capillary: 100 mg/dL — ABNORMAL HIGH (ref 70–99)
Glucose-Capillary: 193 mg/dL — ABNORMAL HIGH (ref 70–99)
Glucose-Capillary: 161 mg/dL — ABNORMAL HIGH (ref 70–99)
Glucose-Capillary: 190 mg/dL — ABNORMAL HIGH (ref 70–99)

## 2021-03-19 NOTE — Plan of Care (Signed)
  Problem: Education: Goal: Knowledge of General Education information will improve Description: Including pain rating scale, medication(s)/side effects and non-pharmacologic comfort measures Outcome: Progressing   Problem: Health Behavior/Discharge Planning: Goal: Ability to manage health-related needs will improve Outcome: Progressing   Problem: Clinical Measurements: Goal: Ability to maintain clinical measurements within normal limits will improve Outcome: Progressing Goal: Will remain free from infection Outcome: Progressing Goal: Diagnostic test results will improve Outcome: Progressing Goal: Respiratory complications will improve Outcome: Progressing Goal: Cardiovascular complication will be avoided Outcome: Progressing   Problem: Activity: Goal: Risk for activity intolerance will decrease Outcome: Progressing   Problem: Nutrition: Goal: Adequate nutrition will be maintained Outcome: Progressing   Problem: Coping: Goal: Level of anxiety will decrease Outcome: Progressing   Problem: Elimination: Goal: Will not experience complications related to bowel motility Outcome: Progressing Goal: Will not experience complications related to urinary retention Outcome: Progressing   Problem: Pain Managment: Goal: General experience of comfort will improve Outcome: Progressing   Problem: Safety: Goal: Ability to remain free from injury will improve Outcome: Progressing   Problem: Skin Integrity: Goal: Risk for impaired skin integrity will decrease Outcome: Progressing   Problem: Safety: Goal: Non-violent Restraint(s) Outcome: Progressing   Problem: Education: Goal: Expressions of having a comfortable level of knowledge regarding the disease process will increase Outcome: Progressing   Problem: Coping: Goal: Ability to adjust to condition or change in health will improve Outcome: Progressing Goal: Ability to identify appropriate support needs will  improve Outcome: Progressing   Problem: Health Behavior/Discharge Planning: Goal: Compliance with prescribed medication regimen will improve Outcome: Progressing   Problem: Medication: Goal: Risk for medication side effects will decrease Outcome: Progressing   Problem: Clinical Measurements: Goal: Complications related to the disease process, condition or treatment will be avoided or minimized Outcome: Progressing Goal: Diagnostic test results will improve Outcome: Progressing   Problem: Safety: Goal: Verbalization of understanding the information provided will improve Outcome: Progressing   Problem: Self-Concept: Goal: Level of anxiety will decrease Outcome: Progressing Goal: Ability to verbalize feelings about condition will improve Outcome: Progressing   

## 2021-03-19 NOTE — Progress Notes (Signed)
[facility]  PROGRESS NOTE  CHISTINE [name] WVP:[id number] DOB: 1965-07-29 DOA: 01/23/2021 PCP: Mirna Mires, MD   Brief HPI:   55 year old female with history of diabetes mellitus, hypertension, developmental delay presented with decreased oral intake and saying bizarre things.  In the ED CT head was unremarkable, she was hypoglycemic.  She was admitted for evaluation of erratic behavior.    Subjective   Patient seen and examined, denies any complaints.   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 -EEG unremarkable -Psychiatry was consulted, diagnosed with catatonia -Resolved with Ativan -Continue Risperdal  Constipation -Resolved with soapsuds enema -Continue Senokot/docusate twice daily -Continue MiraLAX twice daily  Poor oral intake/malnutrition -Resolved -PEG tube placed on 8/29 -Tube can be removed in 6 to 8 weeks per IR  Seizure disorder -Continue Keppra -EEG is unremarkable        Scheduled medications:    enoxaparin (LOVENOX) injection  40 mg Subcutaneous Q24H   feeding supplement (GLUCERNA SHAKE)  237 mL Oral TID BM   free water  100 mL Per Tube Q8H   hydrocerin   Topical BID   insulin aspart  0-15 Units Subcutaneous TID WC   insulin aspart  0-5 Units Subcutaneous QHS   insulin aspart  8 Units Subcutaneous TID WC   insulin glargine-yfgn  30 Units Subcutaneous Daily   levETIRAcetam  1,000 mg Oral BID   loratadine  10 mg Oral Daily   LORazepam  0.5 mg Oral QAC breakfast   metFORMIN  500 mg Oral BID WC   metoprolol succinate  12.5 mg Oral Daily   multivitamin with minerals  1 tablet Oral Daily   polyethylene glycol  17 g Oral BID   risperiDONE  2 mg Oral QHS   senna-docusate  1 tablet Oral BID   valACYclovir  500 mg Oral QHS     Data Reviewed:   CBG:  Recent Labs  Lab 03/18/21 1720 03/18/21 2208 03/18/21 2234 03/19/21 0630 03/19/21 1219  GLUCAP 139* 60* 83 100* 193*    SpO2:  99 % O2 Flow Rate (L/min): 2 L/min    Vitals:   03/19/21 0434 03/19/21 0802 03/19/21 1220 03/19/21 1230  BP: 123/75 114/70 (!) 111/56   Pulse: 92 93 98   Resp: 16 18 18    Temp: 98 F (36.7 C) 97.9 F (36.6 C) (!) 97.5 F (36.4 C) 97.7 F (36.5 C)  TempSrc: Oral Oral Oral Oral  SpO2: 100% 100% 99%   Weight:         Intake/Output Summary (Last 24 hours) at 03/19/2021 1619 Last data filed at 03/19/2021 0543 Gross per 24 hour  Intake --  Output 550 ml  Net -550 ml    09/29 1901 - 10/01 0700 In: -  Out: 1150 [Urine:550; Drains:600]  Filed Weights   02/20/21 0432 02/22/21 0651 03/09/21 0612  Weight: 73.5 kg 71.6 kg 71.7 kg    Data Reviewed: Basic Metabolic Panel: No results for input(s): NA, K, CL, CO2, GLUCOSE, BUN, CREATININE, CALCIUM, MG, PHOS in the last 168 hours. Liver Function Tests: No results for input(s): AST, ALT, ALKPHOS, BILITOT, PROT, ALBUMIN in the last 168 hours. No results for input(s): LIPASE, AMYLASE in the last 168 hours. No results for input(s): AMMONIA in the last 168 hours. CBC: Recent Labs  Lab 03/14/21 0718  WBC 10.9*  HGB 12.4  HCT 38.4  MCV 91.9  PLT 361   Cardiac Enzymes: No results for input(s): CKTOTAL, CKMB, CKMBINDEX, TROPONINI in  the last 168 hours. BNP (last 3 results) Recent Labs    11/01/20 0405 11/02/20 0350 11/03/20 0155  BNP 15.0 8.6 6.0    ProBNP (last 3 results) No results for input(s): PROBNP in the last 8760 hours.  CBG: Recent Labs  Lab 03/18/21 1720 03/18/21 2208 03/18/21 2234 03/19/21 0630 03/19/21 1219  GLUCAP 139* 60* 83 100* 193*       Radiology Reports  No results found.     Antibiotics: Anti-infectives (From admission, onward)    Start     Dose/Rate Route Frequency Ordered Stop   02/23/21 2200  valACYclovir (VALTREX) tablet 500 mg        500 mg Oral Daily at bedtime 02/23/21 1621     02/15/21 2200  valACYclovir (VALTREX) tablet 500 mg  Status:  Discontinued        500 mg Per Tube  Daily at bedtime 02/14/21 1145 02/23/21 1621   02/14/21 0916  vancomycin (VANCOCIN) IVPB 1000 mg/200 mL premix        over 60 Minutes Intravenous Continuous PRN 02/14/21 0916 02/14/21 0916   01/31/21 2215  valACYclovir (VALTREX) tablet 500 mg  Status:  Discontinued        500 mg Per Tube Daily at bedtime 01/31/21 2119 02/14/21 1145   01/25/21 2200  valACYclovir (VALTREX) tablet 500 mg  Status:  Discontinued        500 mg Oral Daily at bedtime 01/25/21 0930 01/31/21 2119         DVT prophylaxis: Lovenox  Code Status: Full code  Family Communication: No family at bedside   Consultants: Neurology Psychiatry  Procedures: EEG Long-term EEG    Objective    Physical Examination:   General-appears in no acute distress Heart-S1-S2, regular, no murmur auscultated Lungs-clear to auscultation bilaterally, no wheezing or crackles auscultated Abdomen-soft, nontender, no organomegaly Extremities-no edema in the lower extremities Neuro-alert, oriented to self, communicating, no focal deficit noted  Status is: Inpatient  Dispo: The patient is from: Home              Anticipated d/c is to: Home              Anticipated d/c date is: 03/23/2021              Patient currently not medically stable for discharge    COVID-19 Labs  No results for input(s): DDIMER, FERRITIN, LDH, CRP in the last 72 hours.  Lab Results  Component Value Date   SARSCOV2NAA NEGATIVE 01/24/2021   SARSCOV2NAA NEGATIVE 10/30/2020   SARSCOV2NAA NEGATIVE 10/28/2020         No results found for this or any previous visit (from the past 240 hour(s)).  Meredeth Ide   [facility] If 7PM-7AM, please contact night-coverage at [email or web address], Office  [phone]   03/19/2021, 4:19 PM  LOS: 53 days

## 2021-03-20 DIAGNOSIS — G9340 Encephalopathy, unspecified: Secondary | ICD-10-CM | POA: Diagnosis not present

## 2021-03-20 DIAGNOSIS — R4182 Altered mental status, unspecified: Secondary | ICD-10-CM | POA: Diagnosis not present

## 2021-03-20 LAB — GLUCOSE, CAPILLARY
Glucose-Capillary: 158 mg/dL — ABNORMAL HIGH (ref 70–99)
Glucose-Capillary: 207 mg/dL — ABNORMAL HIGH (ref 70–99)
Glucose-Capillary: 161 mg/dL — ABNORMAL HIGH (ref 70–99)
Glucose-Capillary: 125 mg/dL — ABNORMAL HIGH (ref 70–99)

## 2021-03-20 NOTE — Plan of Care (Signed)
  Problem: Education: Goal: Knowledge of General Education information will improve Description: Including pain rating scale, medication(s)/side effects and non-pharmacologic comfort measures Outcome: Progressing   Problem: Health Behavior/Discharge Planning: Goal: Ability to manage health-related needs will improve Outcome: Progressing   Problem: Clinical Measurements: Goal: Ability to maintain clinical measurements within normal limits will improve Outcome: Progressing Goal: Will remain free from infection Outcome: Progressing Goal: Diagnostic test results will improve Outcome: Progressing Goal: Respiratory complications will improve Outcome: Progressing Goal: Cardiovascular complication will be avoided Outcome: Progressing   Problem: Activity: Goal: Risk for activity intolerance will decrease Outcome: Progressing   Problem: Nutrition: Goal: Adequate nutrition will be maintained Outcome: Progressing   Problem: Coping: Goal: Level of anxiety will decrease Outcome: Progressing   Problem: Elimination: Goal: Will not experience complications related to bowel motility Outcome: Progressing Goal: Will not experience complications related to urinary retention Outcome: Progressing   Problem: Pain Managment: Goal: General experience of comfort will improve Outcome: Progressing   Problem: Safety: Goal: Ability to remain free from injury will improve Outcome: Progressing   Problem: Skin Integrity: Goal: Risk for impaired skin integrity will decrease Outcome: Progressing   Problem: Safety: Goal: Non-violent Restraint(s) Outcome: Progressing   Problem: Education: Goal: Expressions of having a comfortable level of knowledge regarding the disease process will increase Outcome: Progressing   Problem: Coping: Goal: Ability to adjust to condition or change in health will improve Outcome: Progressing Goal: Ability to identify appropriate support needs will  improve Outcome: Progressing   Problem: Health Behavior/Discharge Planning: Goal: Compliance with prescribed medication regimen will improve Outcome: Progressing   Problem: Medication: Goal: Risk for medication side effects will decrease Outcome: Progressing

## 2021-03-20 NOTE — Progress Notes (Signed)
[facility]  PROGRESS NOTE  [name] EGB:[id number] DOB: May 27, 1966 DOA: 01/23/2021 PCP: Mirna Mires, MD   Brief HPI:   55 year old female with history of diabetes mellitus, hypertension, developmental delay presented with decreased oral intake and saying bizarre things.  In the ED CT head was unremarkable, she was hypoglycemic.  She was admitted for evaluation of erratic behavior.    Subjective   Patient seen, no new complaints.   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 -EEG unremarkable -Psychiatry was consulted, diagnosed with catatonia -Resolved with Ativan -Continue Risperdal  Constipation -Resolved with soapsuds enema -Continue Senokot/docusate twice daily -Continue MiraLAX twice daily  Poor oral intake/malnutrition -Resolved -PEG tube placed on 8/29 -Tube can be removed in 6 to 8 weeks per IR  Seizure disorder -Continue Keppra -EEG is unremarkable        Scheduled medications:    enoxaparin (LOVENOX) injection  40 mg Subcutaneous Q24H   feeding supplement (GLUCERNA SHAKE)  237 mL Oral TID BM   free water  100 mL Per Tube Q8H   hydrocerin   Topical BID   insulin aspart  0-15 Units Subcutaneous TID WC   insulin aspart  0-5 Units Subcutaneous QHS   insulin aspart  8 Units Subcutaneous TID WC   insulin glargine-yfgn  30 Units Subcutaneous Daily   levETIRAcetam  1,000 mg Oral BID   loratadine  10 mg Oral Daily   LORazepam  0.5 mg Oral QAC breakfast   metFORMIN  500 mg Oral BID WC   metoprolol succinate  12.5 mg Oral Daily   multivitamin with minerals  1 tablet Oral Daily   polyethylene glycol  17 g Oral BID   risperiDONE  2 mg Oral QHS   senna-docusate  1 tablet Oral BID   valACYclovir  500 mg Oral QHS     Data Reviewed:   CBG:  Recent Labs  Lab 03/19/21 1219 03/19/21 1623 03/19/21 2137 03/20/21 0618 03/20/21 1204  GLUCAP 193* 161* 190* 158* 207*    SpO2: 98 % O2 Flow  Rate (L/min): 2 L/min    Vitals:   03/20/21 0319 03/20/21 0757 03/20/21 0937 03/20/21 1202  BP: 122/84 115/62 126/73 104/61  Pulse: 93 95 90 92  Resp: 14 18  20   Temp: 97.8 F (36.6 C) (!) 97.5 F (36.4 C)  97.8 F (36.6 C)  TempSrc: Oral Oral  Oral  SpO2: 98% 99%  98%  Weight:         Intake/Output Summary (Last 24 hours) at 03/20/2021 1303 Last data filed at 03/20/2021 0847 Gross per 24 hour  Intake 120 ml  Output 1250 ml  Net -1130 ml    09/30 1901 - 10/02 0700 In: -  Out: 1800 [Urine:1800]  Filed Weights   02/20/21 0432 02/22/21 0651 03/09/21 0612  Weight: 73.5 kg 71.6 kg 71.7 kg    Data Reviewed: Basic Metabolic Panel: No results for input(s): NA, K, CL, CO2, GLUCOSE, BUN, CREATININE, CALCIUM, MG, PHOS in the last 168 hours. Liver Function Tests: No results for input(s): AST, ALT, ALKPHOS, BILITOT, PROT, ALBUMIN in the last 168 hours. No results for input(s): LIPASE, AMYLASE in the last 168 hours. No results for input(s): AMMONIA in the last 168 hours. CBC: Recent Labs  Lab 03/14/21 0718  WBC 10.9*  HGB 12.4  HCT 38.4  MCV 91.9  PLT 361   Cardiac Enzymes: No results for input(s): CKTOTAL, CKMB, CKMBINDEX, TROPONINI in the last 168 hours. BNP (last  3 results) Recent Labs    11/01/20 0405 11/02/20 0350 11/03/20 0155  BNP 15.0 8.6 6.0    ProBNP (last 3 results) No results for input(s): PROBNP in the last 8760 hours.  CBG: Recent Labs  Lab 03/19/21 1219 03/19/21 1623 03/19/21 2137 03/20/21 0618 03/20/21 1204  GLUCAP 193* 161* 190* 158* 207*       Radiology Reports  No results found.     Antibiotics: Anti-infectives (From admission, onward)    Start     Dose/Rate Route Frequency Ordered Stop   02/23/21 2200  valACYclovir (VALTREX) tablet 500 mg        500 mg Oral Daily at bedtime 02/23/21 1621     02/15/21 2200  valACYclovir (VALTREX) tablet 500 mg  Status:  Discontinued        500 mg Per Tube Daily at bedtime 02/14/21 1145  02/23/21 1621   02/14/21 0916  vancomycin (VANCOCIN) IVPB 1000 mg/200 mL premix        over 60 Minutes Intravenous Continuous PRN 02/14/21 0916 02/14/21 0916   01/31/21 2215  valACYclovir (VALTREX) tablet 500 mg  Status:  Discontinued        500 mg Per Tube Daily at bedtime 01/31/21 2119 02/14/21 1145   01/25/21 2200  valACYclovir (VALTREX) tablet 500 mg  Status:  Discontinued        500 mg Oral Daily at bedtime 01/25/21 0930 01/31/21 2119         DVT prophylaxis: Lovenox  Code Status: Full code  Family Communication: No family at bedside   Consultants: Neurology Psychiatry  Procedures: EEG Long-term EEG    Objective    Physical Examination:   General-appears in no acute distress Heart-S1-S2, regular, no murmur auscultated Lungs-clear to auscultation bilaterally, no wheezing or crackles auscultated Abdomen-soft, nontender, no organomegaly Extremities-no edema in the lower extremities Neuro-alert, oriented to self only  Status is: Inpatient  Dispo: The patient is from: Home              Anticipated d/c is to: Home              Anticipated d/c date is: 03/23/2021              Patient currently not medically stable for discharge    COVID-19 Labs  No results for input(s): DDIMER, FERRITIN, LDH, CRP in the last 72 hours.  Lab Results  Component Value Date   SARSCOV2NAA NEGATIVE 01/24/2021   SARSCOV2NAA NEGATIVE 10/30/2020   SARSCOV2NAA NEGATIVE 10/28/2020         No results found for this or any previous visit (from the past 240 hour(s)).  Meredeth Ide   [facility] If 7PM-7AM, please contact night-coverage at [email or web address], Office  [phone]   03/20/2021, 1:03 PM  LOS: 54 days

## 2021-03-21 DIAGNOSIS — G9340 Encephalopathy, unspecified: Secondary | ICD-10-CM | POA: Diagnosis not present

## 2021-03-21 LAB — GLUCOSE, CAPILLARY
Glucose-Capillary: 146 mg/dL — ABNORMAL HIGH (ref 70–99)
Glucose-Capillary: 223 mg/dL — ABNORMAL HIGH (ref 70–99)
Glucose-Capillary: 173 mg/dL — ABNORMAL HIGH (ref 70–99)
Glucose-Capillary: 141 mg/dL — ABNORMAL HIGH (ref 70–99)

## 2021-03-21 NOTE — Progress Notes (Signed)
Physical Therapy Treatment Patient Details Name: [name] MRN: [id number] DOB: Jun 04, 1966 Today's Date: 03/21/2021   History of Present Illness Pt is a 55 y/o female admitted for neurological evaluation of suspected encephalopathy.  However, brain imaging was normal, EEG unremarkable, UA negative, urine drug screen negative, procalcitonin low, B12, ammonia, and TSH normal.  Acute metabolic encephalopathy was ruled out. Progressive mental decline over previous 2 days so another EEG and CT head performed 8/30 with normal results, no sign of seizures. Improving mental status 02/18/21. PMH: intellectual disability, bipolar disorder, seizure disorder, type II diabetes, hypertension, hyperlipidemia, immune deficiency disorder.    PT Comments    Pt supine in bed on entry, eager to tell therapist what she ate for lunch. PT requests pt use bathroom prior to ambulation even though pt states she does not have to go. Pt urinated and then was able to wash hands and brush teeth with supervision. Pt ambulates in hallway with supervision and can follow commands as long as she is not distracted. Pt happy to sit up in recliner and listen to [name].   Recommendations for follow up therapy are one component of a multi-disciplinary discharge planning process, led by the attending physician.  Recommendations may be updated based on patient status, additional functional criteria and insurance authorization.  Follow Up Recommendations  Supervision/Assistance - 24 hour;No PT follow up     Equipment Recommendations  Other (comment);3in1 (PT) (rollator (4 wheeled walker) with a seat, shower chair)       Precautions / Restrictions Precautions Precautions: Fall Precaution Comments: PEG tube Restrictions Weight Bearing Restrictions: No     Mobility  Bed Mobility Overal bed mobility: Modified Independent             General bed mobility comments: able to come to EoB without assist     Transfers Overall transfer level: Needs assistance Equipment used: 4-wheeled walker Transfers: Sit to/from Stand Sit to Stand: Supervision         General transfer comment: supervision for transfers from bed, and Rollator  Ambulation/Gait Ambulation/Gait assistance: Supervision Gait Distance (Feet): 950 Feet Assistive device: 4-wheeled walker;None Gait Pattern/deviations: Step-through pattern;Shuffle Gait velocity: Decreased Gait velocity interpretation: 1.31 - 2.62 ft/sec, indicative of limited community ambulator General Gait Details: pt continues to require instructions for safety, however better command follow          Balance Overall balance assessment: Needs assistance Sitting-balance support: Feet supported Sitting balance-[name]: Good     Standing balance support: No upper extremity supported;During functional activity Standing balance-[name]: Fair Standing balance comment: able to stand to wash hands and brush teeth without outside assist                            Cognition Arousal/Alertness: Awake/alert Behavior During Therapy: Flat affect Overall Cognitive Status: Impaired/Different from baseline Area of Impairment: Orientation;Attention;Memory;Following commands;Safety/judgement;Awareness;Problem solving                 Orientation Level:  (pt able to use sign in room to clarify) Current Attention Level: Focused Memory: Decreased recall of precautions;Decreased short-term memory Following Commands: Follows one step commands consistently;Follows multi-step commands inconsistently;Follows one step commands with increased time Safety/Judgement: Decreased awareness of deficits;Decreased awareness of safety Awareness: Intellectual Problem Solving: Slow processing;Requires verbal cues;Requires tactile cues;Decreased initiation;Difficulty sequencing General Comments: pt with increased command follow and carryover of instructions,  unable to dual task  General Comments General comments (skin integrity, edema, etc.): VSS on RA, pt with abdominal binder in place, likely to support PEG tube? pt able to use restroom prior to ambulation      Pertinent Vitals/Pain Pain Assessment: No/denies pain Faces Pain [name]: No hurt     PT Goals (current goals can now be found in the care plan section) Acute Rehab PT Goals Patient Stated Goal: Not stated PT Goal Formulation: With family Time For Goal Achievement: 03/24/21 Potential to Achieve Goals: Good Progress towards PT goals: Progressing toward goals    Frequency    Min 3X/week      PT Plan Current plan remains appropriate       AM-PAC PT "6 Clicks" Mobility   Outcome Measure  Help needed turning from your back to your side while in a flat bed without using bedrails?: None Help needed moving from lying on your back to sitting on the side of a flat bed without using bedrails?: None Help needed moving to and from a bed to a chair (including a wheelchair)?: A Little Help needed standing up from a chair using your arms (e.g., wheelchair or bedside chair)?: A Little Help needed to walk in hospital room?: A Little Help needed climbing 3-5 steps with a railing? : A Little 6 Click Score: 20    End of Session Equipment Utilized During Treatment: Gait belt Activity Tolerance: Patient tolerated treatment well   Nurse Communication: Mobility status PT Visit Diagnosis: Other abnormalities of gait and mobility (R26.89);Other symptoms and signs involving the nervous system (M25.003)     Time: 7048-8891 PT Time Calculation (min) (ACUTE ONLY): 26 min  Charges:  $Therapeutic Exercise: 8-22 mins $Therapeutic Activity: 8-22 mins                     Jashun Puertas B. Beverely Risen PT, DPT Acute Rehabilitation Services Pager [phone] Office [phone]    Elon Alas Fleet 03/21/2021, 1:52 PM

## 2021-03-21 NOTE — Plan of Care (Signed)

## 2021-03-21 NOTE — Progress Notes (Addendum)
[facility] PROGRESS NOTE  [name] NID:[id number] DOB: February 11, 1966 DOA: 01/23/2021 PCP: Marissa Mires, MD  Status: Remains inpatient appropriate because:Altered mental status, Unsafe d/c plan, IV treatments appropriate due to intensity of illness or inability to take PO, and Inpatient level of care appropriate due to severity of illness  Dispo: The patient is from: Home              Anticipated d/c is to: Home: Sister who states she can provide 24/7 care with assistance of family-still attempting to get in contact with sister to confirm 24/7 care is definitely in place              Patient currently is not medically stable to d/c.Marland Kitchen  She has improved significantly with mobility but is not eligible for home health services therefore will benefit from continued STAR/aggressive therapies prior to discharge   Difficult to place patient Yes   Level of care: Med-Surg  Code Status: Full Family Communication: Sister [name]-10/3.  Updated that current discharge plan is for patient to discharge home with outpatient PT on 10/7 DVT prophylaxis: Lovenox COVID vaccination status: Unknown   HPI: 55 y.o. F with DM, HTN, developmental delay who presented with decreased oral intake and "saying bizarre things".   In the ER, CT head unremarkable.  She was hypoglycemic and otherwise had normal chemistries and hgb  Admitted for evaluation of erratic behavior.  Subjective: Patient awake and listening to [employer] music.  Hopeful for discharge home Friday  Objective: Vitals:   03/21/21 0428 03/21/21 0732  BP: 119/64 111/69  Pulse: 92 (!) 105  Resp:  18  Temp:  98.3 F (36.8 C)  SpO2:  99%    Intake/Output Summary (Last 24 hours) at 03/21/2021 0800 Last data filed at 03/21/2021 0036 Gross per 24 hour  Intake 457 ml  Output 550 ml  Net -93 ml   Filed Weights   02/20/21 0432 02/22/21 0651 03/09/21 0612  Weight: 73.5 kg 71.6 kg 71.7 kg    Exam: Constitutional: NAD,  alert Respiratory: CTA, normal respiratory effort, room air Cardiac: S1-S2, regular pulse and normotensive Abdomen: PEG tube site unremarkable -LBM 9/29 abdomen soft and nontender  Neurologic: Cranial nerves II through XII are intact.  Patient moving all extremities equally with strength 4-5/5.  Sensation intact. 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 Patient admitted with encephalopathy symptoms and behavioral change with all imaging, metabolic work-up and EEG unremarkable Psychiatry consulted -diagnosed with catatonia that resolved with Ativan Continue preadmission Risperdal  Significant constipation with large volume stool in rectal vault -Soapsuds enema x1 with significant results -Continue senna docusate to BID 9/29 -Continue MiraLAX to bid on 9/20  Poor oral intake 2/2 fixed delusions/Malnutrition/Obesity Resolved PEG tube placed on 8/29 -tube can be removed in 6-8 weeks per IR-   Seizure disorder POA Continue keppra Recent EEG unremarkable.    Physical deconditioning  PT/OT aware of discharge plan to home.  Focus will be on stair navigation and increasing mobility Patient now able to navigate up to 19 stairs with minimal assist.  Still needs 24/7 care in regards to impulsivity.   Also some of her mobility and executive functioning skills need to improve before discharge noting she is not eligible for home health services because she has Medicaid   Diabetes mellitus 2 uncontrolled on long-term insulin Continue Semglee and SSI Prior to admission patient was on Lantus 40 units  HS (we are using Semglee ) Continue metformin and Amaryl   Hypokalemia Hypomagnesemia Resolved         Data Reviewed: Basic Metabolic Panel: No results for input(s): NA, K, CL, CO2, GLUCOSE, BUN, CREATININE, CALCIUM, MG, PHOS in the last 168 hours.  Liver Function  Tests: No results for input(s): AST, ALT, ALKPHOS, BILITOT, PROT, ALBUMIN in the last 168 hours.  No results for input(s): LIPASE, AMYLASE in the last 168 hours. No results for input(s): AMMONIA in the last 168 hours. CBC: No results for input(s): WBC, NEUTROABS, HGB, HCT, MCV, PLT in the last 168 hours.     CBG: Recent Labs  Lab 03/20/21 0618 03/20/21 1204 03/20/21 1608 03/20/21 2123 03/21/21 0629  GLUCAP 158* 207* 161* 125* 146*    Scheduled Meds:  enoxaparin (LOVENOX) injection  40 mg Subcutaneous Q24H   feeding supplement (GLUCERNA SHAKE)  237 mL Oral TID BM   free water  100 mL Per Tube Q8H   hydrocerin   Topical BID   insulin aspart  0-15 Units Subcutaneous TID WC   insulin aspart  0-5 Units Subcutaneous QHS   insulin aspart  8 Units Subcutaneous TID WC   insulin glargine-yfgn  30 Units Subcutaneous Daily   levETIRAcetam  1,000 mg Oral BID   loratadine  10 mg Oral Daily   LORazepam  0.5 mg Oral QAC breakfast   metFORMIN  500 mg Oral BID WC   metoprolol succinate  12.5 mg Oral Daily   multivitamin with minerals  1 tablet Oral Daily   polyethylene glycol  17 g Oral BID   risperiDONE  2 mg Oral QHS   senna-docusate  1 tablet Oral BID   valACYclovir  500 mg Oral QHS      Principal Problem:   Acute encephalopathy Active Problems:   Hypoglycemia   Constipation   Bipolar disorder (HCC)   Diabetes (HCC)   AMS (altered mental status)   Poor appetite   Psychogenic loss of appetite   Intellectual delay   Consultants: Neurology Psychiatry  Procedures: EEG Long-term EEG  Antibiotics: Valacyclovir 8/15 through 8/28   Time spent: 15 minutes    [name] ANP  [facility] 7 am - 330 pm/M-F for direct patient care and secure chat Please refer to Amion for contact info 55  days

## 2021-03-22 DIAGNOSIS — G9340 Encephalopathy, unspecified: Secondary | ICD-10-CM | POA: Diagnosis not present

## 2021-03-22 LAB — GLUCOSE, CAPILLARY
Glucose-Capillary: 140 mg/dL — ABNORMAL HIGH (ref 70–99)
Glucose-Capillary: 144 mg/dL — ABNORMAL HIGH (ref 70–99)
Glucose-Capillary: 164 mg/dL — ABNORMAL HIGH (ref 70–99)
Glucose-Capillary: 149 mg/dL — ABNORMAL HIGH (ref 70–99)

## 2021-03-22 NOTE — Progress Notes (Addendum)
Physical Therapy Treatment Patient Details Name: [name] MRN: [id number] DOB: 23-Jun-1965 Today's Date: 03/22/2021   History of Present Illness Pt is a 55 y/o female admitted for neurological evaluation of suspected encephalopathy.  However, brain imaging was normal, EEG unremarkable, UA negative, urine drug screen negative, procalcitonin low, B12, ammonia, and TSH normal.  Acute metabolic encephalopathy was ruled out. Progressive mental decline over previous 2 days so another EEG and CT head performed 8/30 with normal results, no sign of seizures. Improving mental status 02/18/21. PMH: intellectual disability, bipolar disorder, seizure disorder, type II diabetes, hypertension, hyperlipidemia, immune deficiency disorder.    PT Comments    Pt received in recliner, agreeable to therapy session and with good participation in gait progression with rollator and stair negotiation. Emphasis on some higher level balance tasks although pt having some difficulty following instructions with Dynamic Gait Index tasks due to cognition. Pt continues to demonstrate balance and endurance improvements with use of rollator for hallway gait tasks and performed x20 steps with single rail and supervision/no physical support needed. Pt remained up in chair with tray table set up for dinner at end of session and pt agreeable to eat dinner, chair alarm on for safety. Pt continues to benefit from PT services to progress toward functional mobility goals.   Recommendations for follow up therapy are one component of a multi-disciplinary discharge planning process, led by the attending physician.  Recommendations may be updated based on patient status, additional functional criteria and insurance authorization.  Follow Up Recommendations  Supervision/Assistance - 24 hour;No PT follow up     Equipment Recommendations  Other (comment);3in1 (PT) (rollator with a seat, shower chair)    Recommendations for Other Services        Precautions / Restrictions Precautions Precautions: Fall Precaution Comments: PEG tube Restrictions Weight Bearing Restrictions: No     Mobility  Bed Mobility      General bed mobility comments: pt received in chair    Transfers Overall transfer level: Needs assistance Equipment used: 4-wheeled walker Transfers: Sit to/from Stand Sit to Stand: Supervision         General transfer comment: min guard for toilet transfer for safety, pt not reaching for rail although instructed to.  Ambulation/Gait Ambulation/Gait assistance: Supervision Gait Distance (Feet): 1000 Feet Assistive device: 4-wheeled walker Gait Pattern/deviations: Step-through pattern;Shuffle Gait velocity: Decreased Gait velocity interpretation: 1.31 - 2.62 ft/sec, indicative of limited community ambulator General Gait Details: good use of rollator, pt at times not follow directional cues; able to turn head and look up/down and perform slow pivot without LOB. pt having difficulty following some instructions for DGI tasks   Stairs Stairs: Yes Stairs assistance: Supervision Stair Management: Alternating pattern;Forwards;One rail Right Number of Stairs: 25 General stair comments: alternating pattern, pt steady with single rail, no LOB or buckling; VSS throughout      Balance Overall balance assessment: Needs assistance Sitting-balance support: Feet supported Sitting balance-Leahy Scale: Good     Standing balance support: No upper extremity supported;During functional activity Standing balance-Leahy Scale: Fair Standing balance comment: min guard in narrow spaces where rollator will not fit in room with no AD and Supervision using rollator.     Pt scored 12/24 on 6 out of 8 Dynamic Gait Index tasks (unable to follow instructions for stepping around obstacles and stepping over object), but pt's score would indicate increased fall risk even if pt scored "normal" on other two items, so continue to  recommend 24hr supervision and assist at home  for safety.                      Cognition Arousal/Alertness: Awake/alert Behavior During Therapy: Flat affect Overall Cognitive Status: Impaired/Different from baseline Area of Impairment: Orientation;Attention;Memory;Following commands;Safety/judgement;Awareness;Problem solving                 Orientation Level: Disoriented to;Place;Situation Current Attention Level: Focused Memory: Decreased recall of precautions;Decreased short-term memory Following Commands: Follows one step commands consistently;Follows multi-step commands inconsistently;Follows one step commands with increased time Safety/Judgement: Decreased awareness of deficits;Decreased awareness of safety Awareness: Intellectual Problem Solving: Slow processing;Requires verbal cues;Requires tactile cues;Decreased initiation;Difficulty sequencing General Comments: min cues for safety especially in narrow spaces/getting into bathroom; pt at times ignoring directional cues, unclear if due to pt not understanding instructions?      Exercises      General Comments General comments (skin integrity, edema, etc.): HR 80's-100 bpm and SpO2 99% on RA with exertion      Pertinent Vitals/Pain Pain Assessment: No/denies pain    Home Living                      Prior Function            PT Goals (current goals can now be found in the care plan section) Acute Rehab PT Goals Patient Stated Goal: Not stated PT Goal Formulation: With family Time For Goal Achievement: 03/24/21 Progress towards PT goals: Progressing toward goals    Frequency    Min 3X/week      PT Plan Current plan remains appropriate    Co-evaluation              AM-PAC PT "6 Clicks" Mobility   Outcome Measure  Help needed turning from your back to your side while in a flat bed without using bedrails?: None Help needed moving from lying on your back to sitting on the side of  a flat bed without using bedrails?: None Help needed moving to and from a bed to a chair (including a wheelchair)?: A Little Help needed standing up from a chair using your arms (e.g., wheelchair or bedside chair)?: A Little Help needed to walk in hospital room?: A Little Help needed climbing 3-5 steps with a railing? : A Little 6 Click Score: 20    End of Session Equipment Utilized During Treatment: Gait belt Activity Tolerance: Patient tolerated treatment well Patient left: in chair;with call bell/phone within reach;with chair alarm set Nurse Communication: Mobility status PT Visit Diagnosis: Other abnormalities of gait and mobility (R26.89);Other symptoms and signs involving the nervous system (R29.898)     Time: 3818-2993 PT Time Calculation (min) (ACUTE ONLY): 22 min  Charges:  $Gait Training: 8-22 mins                     Marili Vader P., PTA Acute Rehabilitation Services Pager: [phone] Office: [phone]    Dorathy Kinsman Jerusha Reising 03/22/2021, 6:05 PM

## 2021-03-22 NOTE — TOC Transition Note (Signed)
Transition of Care [facility]) - CM/SW Discharge Note   Patient Details  Name: [name] MRN: [id number] Date of Birth: 1965/08/11  Transition of Care [facility]) CM/SW Contact:  Janae Bridgeman, RN Phone Number: 03/22/2021, 8:24 AM   Clinical Narrative:    Case management spoke with Junious Silk, NP and plans are to discharge the patient home with the patient's sister on Friday, 03/25/2021.  The patient is ambulating with a rolling walker with a seat at this time.  DME was delivered to the hospital room weeks ago for family including rolling walker with seat, 3:1 and tub seat.  CM was unable to establish home health PT due to staffing shortages with home health agencies and referral placed for outpatient PT at [facility] for continued physical conditioning and ambulation safety.  Edwin Dada, MSW asked that referral be placed for PCS services versus CAP services for respite assistance for the patient's sister.  Referral request for CAP  program was filled out and faxed to [facility] Medicaid office to [phone].  CM called and confirmed that Dr. Mirna Mires is current PCP and appointment reminder placed for PCP follow up appointment in the next 7-10 days for family to schedule.  CM and MSW with DTP Team will continue to follow the patient for discharge needs for home on 03/25/2021.    Final next level of care: OP Rehab Barriers to Discharge: Barriers Resolved   Patient Goals and CMS Choice Patient states their goals for this hospitalization and ongoing recovery are:: Patient verbalizes that she wants to discharge home with her sister. CMS Medicare.gov Compare Post Acute Care list provided to:: Legal Guardian Choice offered to / list presented to : Sibling  Discharge Placement                       Discharge Plan and Services In-house Referral: Clinical Social Work Discharge Planning Services: CM Consult Post Acute Care Choice: Durable Medical  Equipment          DME Arranged: 3-N-1, Shower stool, Walker rolling with seat DME Agency: [employer]                  Social Determinants of Health (SDOH) Interventions     Readmission Risk Interventions No flowsheet data found.

## 2021-03-22 NOTE — Progress Notes (Signed)
Speech Language Pathology Treatment: Cognitive-Linquistic  Patient Details Name: [name] MRN: [id number] DOB: July 02, 1965 Today's Date: 03/22/2021 Time: 5015-8682 SLP Time Calculation (min) (ACUTE ONLY): 9 min  Assessment / Plan / Recommendation Clinical Impression  Pt was seen for cognitive therapy. Prior pt notes report aphasia; pt exhibited largely intact expressive/receptive language this date. Pt with intellectual disability at baseline; family not present to discuss prior level of function. Pt discussed TV show, plans for discharge, and was oriented to person, place, and time (month). Pt disoriented to date (10/3 for 10/4) and required 2 verbal cues from SLP to reorient. Pt is functional for current venue and SLP will d/c and current level of functioning is perceived to be somewhere close to baseline. All further speech therapy needs can be addressed at next venue.    HPI HPI: Pt is a 55 y/o female admitted for neurological evaluation of suspected encephalopathy.  However, brain imaging was normal, EEG unremarkable, UA negative, urine drug screen negative, procalcitonin low, B12, ammonia, and TSH normal.  Acute metabolic encephalopathy was ruled out. Progressive mental decline over previous 2 days so another EEG and CT head performed 8/30 with normal results, no sign of seizures. Improving mental status 02/18/21. PMH: intellectual disability, bipolar disorder, seizure disorder, type II diabetes, hypertension, hyperlipidemia, immune deficiency disorder.      SLP Plan  All goals met      Recommendations for follow up therapy are one component of a multi-disciplinary discharge planning process, led by the attending physician.  Recommendations may be updated based on patient status, additional functional criteria and insurance authorization.    Recommendations                   Oral Care Recommendations: Oral care BID Follow up Recommendations: 24 hour supervision/assistance SLP  Visit Diagnosis: Cognitive communication deficit (B74.935) Plan: All goals met       GO             Dewitt Rota, SLP-Student    Dewitt Rota  03/22/2021, 2:53 PM

## 2021-03-22 NOTE — Progress Notes (Signed)
Occupational Therapy Treatment Patient Details Name: [name] MRN: [id number] DOB: 11-28-1965 Today's Date: 03/22/2021   History of present illness Pt is a 55 y/o female admitted for neurological evaluation of suspected encephalopathy.  However, brain imaging was normal, EEG unremarkable, UA negative, urine drug screen negative, procalcitonin low, B12, ammonia, and TSH normal.  Acute metabolic encephalopathy was ruled out. Progressive mental decline over previous 2 days so another EEG and CT head performed 8/30 with normal results, no sign of seizures. Improving mental status 02/18/21. PMH: intellectual disability, bipolar disorder, seizure disorder, type II diabetes, hypertension, hyperlipidemia, immune deficiency disorder.   OT comments  Patient seen by skilled OT to address toilet transfer, toilet hygiene, grooming/bathing standing at sink, and dressing. Patient is requiring fewer cues if setup is provided and is oriented to positioning of items.  Patient was able to donn T-shirt, shorts, and socks with supervision. Patient making good progress with OT treatment.    Recommendations for follow up therapy are one component of a multi-disciplinary discharge planning process, led by the attending physician.  Recommendations may be updated based on patient status, additional functional criteria and insurance authorization.    Follow Up Recommendations  Home health OT    Equipment Recommendations  3 in 1 bedside commode;Tub/shower seat    Recommendations for Other Services      Precautions / Restrictions Precautions Precautions: Fall Precaution Comments: PEG tube       Mobility Bed Mobility Overal bed mobility: Modified Independent             General bed mobility comments: patient able to perform bed mobility with intiation cue    Transfers Overall transfer level: Needs assistance Equipment used: 4-wheeled walker Transfers: Sit to/from Stand Sit to Stand:  Supervision Stand pivot transfers: Supervision;Min guard       General transfer comment: min guard for toilet transfer for safety    Balance Overall balance assessment: Needs assistance Sitting-balance support: Feet supported Sitting balance-Leahy Scale: Good     Standing balance support: No upper extremity supported;During functional activity Standing balance-Leahy Scale: Fair Standing balance comment: stood at sink for self care                           ADL either performed or assessed with clinical judgement   ADL Overall ADL's : Needs assistance/impaired     Grooming: Wash/dry hands;Wash/dry face;Oral care;Applying deodorant;Supervision/safety;Cueing for sequencing;Standing Grooming Details (indicate cue type and reason): requiered no cues for brushing teeth, required cue to apply deodorant Upper Body Bathing: Supervision/ safety;Standing Upper Body Bathing Details (indicate cue type and reason): performed own sequencing Lower Body Bathing: Sitting/lateral leans;Sit to/from stand;Supervison/ safety Lower Body Bathing Details (indicate cue type and reason): cues to complete Upper Body Dressing : Supervision/safety;Sitting Upper Body Dressing Details (indicate cue type and reason): donned T-shirt Lower Body Dressing: Minimal assistance Lower Body Dressing Details (indicate cue type and reason): donned pullup brief, shorts, and socks with assistance to pull up brief Toilet Transfer: Min guard;Cueing for Health and safety inspector Details (indicate cue type and reason): verbal cues for rail use and safety Toileting- Clothing Manipulation and Hygiene: Minimal assistance;Cueing for sequencing Toileting - Clothing Manipulation Details (indicate cue type and reason): requires assistance to complete     Functional mobility during ADLs: Supervision/safety;Min guard General ADL Comments: cues for safety     Vision       Perception     Praxis  Cognition Arousal/Alertness: Awake/alert Behavior During Therapy: Flat affect Overall Cognitive Status: Impaired/Different from baseline Area of Impairment: Orientation;Attention;Memory;Following commands;Safety/judgement;Awareness;Problem solving                 Orientation Level: Disoriented to;Place;Situation Current Attention Level: Focused Memory: Decreased recall of precautions;Decreased short-term memory Following Commands: Follows one step commands consistently;Follows multi-step commands inconsistently;Follows one step commands with increased time Safety/Judgement: Decreased awareness of deficits;Decreased awareness of safety Awareness: Intellectual Problem Solving: Slow processing;Requires verbal cues;Requires tactile cues;Decreased initiation;Difficulty sequencing General Comments: required fewer cues during self care        Exercises     Shoulder Instructions       General Comments      Pertinent Vitals/ Pain       Pain Assessment: No/denies pain  Home Living                                          Prior Functioning/Environment              Frequency  Min 2X/week        Progress Toward Goals  OT Goals(current goals can now be found in the care plan section)  Progress towards OT goals: Progressing toward goals  Acute Rehab OT Goals Patient Stated Goal: Not stated OT Goal Formulation: With patient Time For Goal Achievement: 03/29/21 Potential to Achieve Goals: Good ADL Goals Pt Will Perform Grooming: with min guard assist;sitting Pt Will Perform Upper Body Bathing: with min assist;sitting Pt Will Perform Lower Body Bathing: with supervision;sit to/from stand Pt Will Perform Upper Body Dressing: with min assist;sitting Pt Will Perform Lower Body Dressing: with supervision;sit to/from stand Pt Will Transfer to Toilet: with modified independence Pt Will Perform Toileting - Clothing Manipulation and hygiene: with  supervision;sitting/lateral leans Additional ADL Goal #1: Pt will complete grooming tasks in standing position at sink level with min vc Additional ADL Goal #2: [name] will verbalize understanding of how to assist pt with ADL tasks to maximize safety and funcitonal level of independence Additional ADL Goal #3: Staff/caregiver will implement bowel/bladder program, toileting pt q 2 hrs when awake to increase awareness of bowel/bladder control adn reduce frequencies of incontinence episodes.  Plan Discharge plan remains appropriate;Frequency needs to be updated    Co-evaluation                 AM-PAC OT "6 Clicks" Daily Activity     Outcome Measure   Help from another person eating meals?: None Help from another person taking care of personal grooming?: A Little Help from another person toileting, which includes using toliet, bedpan, or urinal?: A Little Help from another person bathing (including washing, rinsing, drying)?: A Little Help from another person to put on and taking off regular upper body clothing?: A Little Help from another person to put on and taking off regular lower body clothing?: A Little 6 Click Score: 19    End of Session Equipment Utilized During Treatment: Rolling walker  OT Visit Diagnosis: Other symptoms and signs involving cognitive function;Muscle weakness (generalized) (M62.81) Pain - part of body: Arm;Shoulder;Leg;Ankle and joints of foot;Knee   Activity Tolerance Patient tolerated treatment well   Patient Left in chair;with call bell/phone within reach;with chair alarm set   Nurse Communication Mobility status        Time: 6237-6283 OT Time Calculation (min): 27 min  Charges: OT General Charges $OT  Visit: 1 Visit OT Treatments $Self Care/Home Management : 23-37 mins  Alfonse Flavors, OTA   Dewain Penning 03/22/2021, 8:58 AM

## 2021-03-22 NOTE — Progress Notes (Signed)
[facility] PROGRESS NOTE  [name] QZR:[id number] DOB: 17-Aug-1965 DOA: 01/23/2021 PCP: Mirna Mires, MD  Status: Remains inpatient appropriate because:Altered mental status, Unsafe d/c plan, IV treatments appropriate due to intensity of illness or inability to take PO, and Inpatient level of care appropriate due to severity of illness  Dispo: The patient is from: Home              Anticipated d/c is to: Home: Sister who states she can provide 24/7 care with assistance of family-still attempting to get in contact with sister to confirm 24/7 care is definitely in place              Patient currently is not medically stable to d/c.Marland Kitchen  She has improved significantly with mobility but is not eligible for home health services therefore will benefit from continued STAR/aggressive therapies prior to discharge   Difficult to place patient Yes   Level of care: Med-Surg  Code Status: Full Family Communication: Sister [name]-10/3.  Updated that current discharge plan is for patient to discharge home with outpatient PT on 10/7 DVT prophylaxis: Lovenox COVID vaccination status: Unknown   HPI: 55 y.o. F with DM, HTN, developmental delay who presented with decreased oral intake and "saying bizarre things". In the ER, CT head unremarkable.  She was hypoglycemic and otherwise had normal chemistries and hgb  Admitted for evaluation of erratic behavior.  Since admission patient has had a significant neurological work-up that did not reveal etiology to her symptoms.  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  She was started on Ativan with significant improvement in symptoms.  Ativan was tapered and DC'd but unfortunately catatonia symptoms recurred.  Patient's alertness once again improved after she was placed on low-dose scheduled Ativan daily.  Currently she is working aggressively with PT and OT and plans are to  discharge the patient home on 10/7 with home health PT and potentially CAP or PCS services pending eligibility  Subjective: Alert.  Sitting up in chair.  No complaints verbalized.  Objective: Vitals:   03/22/21 0420 03/22/21 0800  BP: 98/67 117/61  Pulse: 96 98  Resp:  16  Temp:  98.3 F (36.8 C)  SpO2:  97%    Intake/Output Summary (Last 24 hours) at 03/22/2021 0816 Last data filed at 03/21/2021 1746 Gross per 24 hour  Intake 410 ml  Output --  Net 410 ml   Filed Weights   02/20/21 0432 02/22/21 0651 03/09/21 0612  Weight: 73.5 kg 71.6 kg 71.7 kg    Exam: Constitutional: NAD, alert Respiratory: CTA, normal respiratory effort, room air Cardiac: S1-S2, regular pulse and normotensive Abdomen: PEG tube site unremarkable -LBM 10/04 abdomen soft and nontender  Neurologic: Cranial nerves II through XII are intact.  Patient moving all extremities equally with strength 4-5/5.  Sensation intact. 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    Physical deconditioning  Patient now able to navigate up to 19+ stairs with minimal assist.  Still needs 24/7 care in regards to impulsivity.   She is not eligible for home health  services because she has Medicaid   Diabetes mellitus 2 uncontrolled on long-term insulin Continue Semglee and SSI Prior to admission patient was on Lantus 40 units HS (we are using Semglee ) Continue metformin and Amaryl   Hypokalemia Hypomagnesemia Resolved      Data Reviewed: Basic Metabolic Panel: No results for input(s): NA, K, CL, CO2, GLUCOSE, BUN, CREATININE, CALCIUM, MG, PHOS in the last 168 hours.  Liver Function Tests: No results for input(s): AST, ALT, ALKPHOS, BILITOT, PROT, ALBUMIN in the last 168 hours.  No results for input(s): LIPASE, AMYLASE in the last 168 hours. No results for input(s): AMMONIA in the last 168 hours. CBC: No results for input(s): WBC, NEUTROABS, HGB, HCT, MCV, PLT in the last 168 hours.     CBG: Recent Labs  Lab 03/21/21 0629 03/21/21 1100 03/21/21 1704 03/21/21 2126 03/22/21 0625  GLUCAP 146* 223* 173* 141* 140*    Scheduled Meds:  enoxaparin (LOVENOX) injection  40 mg Subcutaneous Q24H   feeding supplement (GLUCERNA SHAKE)  237 mL Oral TID BM   free water  100 mL Per Tube Q8H   hydrocerin   Topical BID   insulin aspart  0-15 Units Subcutaneous TID WC   insulin aspart  0-5 Units Subcutaneous QHS   insulin aspart  8 Units Subcutaneous TID WC   insulin glargine-yfgn  30 Units Subcutaneous Daily   levETIRAcetam  1,000 mg Oral BID   loratadine  10 mg Oral Daily   LORazepam  0.5 mg Oral QAC breakfast   metFORMIN  500 mg Oral BID WC   metoprolol succinate  12.5 mg Oral Daily   multivitamin with minerals  1 tablet Oral Daily   polyethylene glycol  17 g Oral BID   risperiDONE  2 mg Oral QHS   senna-docusate  1 tablet Oral BID   valACYclovir  500 mg Oral QHS      Principal Problem:   Acute encephalopathy Active Problems:   Hypoglycemia   Constipation   Bipolar disorder (HCC)   Diabetes (HCC)   AMS (altered mental status)   Poor appetite   Psychogenic loss of appetite   Intellectual  delay   Consultants: Neurology Psychiatry  Procedures: EEG Long-term EEG  Antibiotics: Valacyclovir 8/15 through 8/28   Time spent: 15 minutes    Junious Silk ANP  [facility] 7 am - 330 pm/M-F for direct patient care and secure chat Please refer to Amion for contact info 56  days

## 2021-03-23 DIAGNOSIS — G9340 Encephalopathy, unspecified: Secondary | ICD-10-CM | POA: Diagnosis not present

## 2021-03-23 LAB — GLUCOSE, CAPILLARY
Glucose-Capillary: 115 mg/dL — ABNORMAL HIGH (ref 70–99)
Glucose-Capillary: 120 mg/dL — ABNORMAL HIGH (ref 70–99)
Glucose-Capillary: 162 mg/dL — ABNORMAL HIGH (ref 70–99)
Glucose-Capillary: 163 mg/dL — ABNORMAL HIGH (ref 70–99)
Glucose-Capillary: 188 mg/dL — ABNORMAL HIGH (ref 70–99)

## 2021-03-23 MED ORDER — INSULIN ASPART 100 UNIT/ML IJ SOLN: 8.0000 [IU] | Freq: Three times a day (TID) | INTRAMUSCULAR | 11 refills | Status: AC

## 2021-03-23 MED ORDER — SENNOSIDES-DOCUSATE SODIUM 8.6-50 MG PO TABS: 1.0000 | ORAL_TABLET | Freq: Two times a day (BID) | ORAL | 6 refills | Status: DC

## 2021-03-23 MED ORDER — ACETAMINOPHEN 325 MG PO TABS
650.0000 mg | ORAL_TABLET | Freq: Four times a day (QID) | ORAL | Status: DC | PRN
Start: 2021-03-23 — End: 2023-03-13

## 2021-03-23 MED ORDER — POLYETHYLENE GLYCOL 3350 17 G PO PACK: 17.0000 g | PACK | Freq: Two times a day (BID) | ORAL | 0 refills | Status: DC

## 2021-03-23 MED ORDER — ADULT MULTIVITAMIN W/MINERALS CH: 1.0000 | ORAL_TABLET | Freq: Every day | ORAL | Status: DC

## 2021-03-23 MED ORDER — METOPROLOL SUCCINATE ER 25 MG PO TB24: 12.5000 mg | ORAL_TABLET | Freq: Every day | ORAL | 2 refills | Status: AC

## 2021-03-23 MED ORDER — LORAZEPAM 0.5 MG PO TABS: 0.5000 mg | ORAL_TABLET | Freq: Every day | ORAL | 0 refills | Status: AC

## 2021-03-23 MED ORDER — INSULIN GLARGINE 100 UNITS/ML SOLOSTAR PEN: 30.0000 [IU] | PEN_INJECTOR | Freq: Every day | SUBCUTANEOUS | 11 refills | Status: AC

## 2021-03-23 NOTE — TOC Progression Note (Signed)
Transition of Care [facility]) - Progression Note    Patient Details  Name: [name] MRN: [id number] Date of Birth: July 23, 1965  Transition of Care [facility]) CM/SW Contact  Janae Bridgeman, RN Phone Number: 03/23/2021, 8:14 AM  Clinical Narrative:    Case management spoke with the patient's sister, [name] on the phone to discuss discharge plans and follow up regarding patient appointments and services for care in the home.  I explained to the sister that she will need to call the PCP, Dr. Loleta Chance today to schedule a follow up appointment in the next 7-10 days after discharge.  I also discussed with the patient's sister that I placed a referral for CAP/DA services and that this service has a 3-4 month waiting period and the Medicaid CAP coordinator will reach out to the patient's sister.  Rosezella Rumpf, NP was notified that the sister will transport the patient home on Friday around noon and discharge medications will be called in to the pharmacy today by Rennis Harding, NP for the family to pick up.  Stokes states that she plans to pick up the DME in the patient's room on Friday or sooner if able.  CM and MSW with DTP Team will continue to follow the patient for discharge plans for home on Friday, 03/25/2021.   Expected Discharge Plan: OP Rehab Barriers to Discharge: Barriers Resolved  Expected Discharge Plan and Services Expected Discharge Plan: OP Rehab In-house Referral: Clinical Social Work Discharge Planning Services: CM Consult Post Acute Care Choice: Durable Medical Equipment Living arrangements for the past 2 months: Apartment                 DME Arranged: 3-N-1, Shower stool, Walker rolling with seat DME Agency: [employer]                   Social Determinants of Health (SDOH) Interventions    Readmission Risk Interventions No flowsheet data found.

## 2021-03-23 NOTE — Progress Notes (Signed)
Nutrition Follow-up  DOCUMENTATION CODES:  Not applicable  INTERVENTION:  Please obtain new measured weight  -Continue Glucerna Shake po TID, each supplement provides 220 kcal and 10 grams of protein -Continue MVI with minerals daily  NUTRITION DIAGNOSIS:  Inadequate oral intake related to lethargy/confusion as evidenced by meal completion < 25%, per patient/family report. -- ongoing  GOAL:  Patient will meet greater than or equal to 90% of their needs -- met  MONITOR:  PO intake, Supplement acceptance, Labs, Weight trends, Skin, I & O's  REASON FOR ASSESSMENT:  Consult Enteral/tube feeding initiation and management  ASSESSMENT:  Pt with PMH significant of intellectual disability, bipolar disorder, seizure disorder, type 2 DM, HTN, HLD, immunodeficiency disorder presented to the ED for change in mental status as reported by patient's sister for the past couple of days at home. CT head, MRI brain, EEG, and other neurologic work-up have been negative. Abdominal x-ray showed moderate stool burden. Pt does have h/o constipation and was recommended by GI to take Linzess and Dulcolax, though pt has been noncompliant. Pt has outpatient colonoscopy scheduled.  8/15 Cortrak placed (gastric tip) 8/29 PEG placed   Pt awake and without complaints at this time. Pt continues to have excellent appetite/intake now that she is receiving daily Ativan dose (catatonic symptoms when tapered off Ativan previously caused refusal of POs) with 75-100% meal completion x last 8 recorded meals. Pt doing well with supplements per RN. Per NP, plans for pt to discharge home with home health on 10/7. Pt now having regular BMs.  Medications: Glucerna shake TID, keppra, ativan, SSI, novolog, semglee, glucophage, mvi with minerals, 115m free water Q8H via tube, miralax, senokot-s Labs reviewed.  CBGs: 1161-096E45hours (Diabetes Coordinator following)  Weight needs to be updated as no weight taken since  9/21  Diet Order:   Diet Order             Diet Carb Modified Fluid consistency: Thin; Room service appropriate? Yes  Diet effective now                  EDUCATION NEEDS:  Not appropriate for education at this time  Skin:  Skin Assessment: Reviewed RN Assessment  Last BM:  10/5  Height:  Ht Readings from Last 1 Encounters:  10/30/20 '5\' 1"'  (1.549 m)   Weight:  Wt Readings from Last 1 Encounters:  03/09/21 71.7 kg   BMI:  Body mass index is 29.87 kg/m.  Estimated Nutritional Needs:  Kcal:  1800-2000 Protein:  90-100 grams Fluid:  >1.8L/d    ALarkin Ina MS, RD, LDN (she/her/hers) RD pager number and weekend/on-call pager number located in [email or web address]

## 2021-03-23 NOTE — Progress Notes (Signed)
[facility] PROGRESS NOTE  [name] PYP:[id number] DOB: 1966/05/20 DOA: 01/23/2021 PCP: Marissa Mires, MD  Status: Remains inpatient appropriate because:Altered mental status, Unsafe d/c plan, IV treatments appropriate due to intensity of illness or inability to take PO, and Inpatient level of care appropriate due to severity of illness  Dispo: The patient is from: Home              Anticipated d/c is to: Home: Sister who states she can provide 24/7 care with assistance of family-still attempting to get in contact with sister to confirm 24/7 care is definitely in place              Patient currently is not medically stable to d/c.Marland Kitchen  She has improved significantly with mobility but is not eligible for home health services therefore will benefit from continued STAR/aggressive therapies prior to discharge   Difficult to place patient Yes   Level of care: Med-Surg  Code Status: Full Family Communication: Sister [name]-10/3.  Updated that current discharge plan is for patient to discharge home with outpatient PT on 10/7 DVT prophylaxis: Lovenox COVID vaccination status: Unknown   HPI: 55 y.o. F with DM, HTN, developmental delay who presented with decreased oral intake and "saying bizarre things". In the ER, CT head unremarkable.  She was hypoglycemic and otherwise had normal chemistries and hgb  Admitted for evaluation of erratic behavior.  Since admission patient has had a significant neurological work-up that did not reveal etiology to her symptoms.  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  She was started on Ativan with significant improvement in symptoms.  Ativan was tapered and DC'd but unfortunately catatonia symptoms recurred.  Patient's alertness once again improved after she was placed on low-dose scheduled Ativan daily.  Currently she is working aggressively with PT and OT and plans are to  discharge the patient home on 10/7 with home health PT and potentially CAP or PCS services pending eligibility  Subjective: Sitting up in chair listening to her favorite musical artist [name].  No complaints.  Objective: Vitals:   03/23/21 0002 03/23/21 0406  BP: (!) 96/49 (!) 94/48  Pulse: 92 85  Resp: 18 19  Temp: 98.6 F (37 C) 98.9 F (37.2 C)  SpO2: 97% 100%    Intake/Output Summary (Last 24 hours) at 03/23/2021 0757 Last data filed at 03/22/2021 1225 Gross per 24 hour  Intake 1225 ml  Output --  Net 1225 ml   Filed Weights   02/20/21 0432 02/22/21 0651 03/09/21 0612  Weight: 73.5 kg 71.6 kg 71.7 kg    Exam: Constitutional: NAD, alert Respiratory: Lungs remain clear, room air, normal pulse oximeter Cardiac: S1-S2, normal blood pressure reading, no peripheral edema, skin warm and dry Abdomen: PEG tube site unremarkable -LBM 10/05 -eating 100% of meals.  Abdomen soft with normoactive bowel sounds. Neurologic: Cranial nerves II through XII are intact.  Patient moving all extremities equally with strength 4-5/5.  Sensation intact. 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    Physical deconditioning  Patient now able  to navigate up to 19+ stairs with minimal assist.  Still needs 24/7 care in regards to impulsivity.   She is not eligible for home health services because she has Medicaid   Diabetes mellitus 2 uncontrolled on long-term insulin Continue Semglee and SSI Prior to admission patient was on Lantus 40 units HS (we are using Semglee ) Continue metformin and Amaryl   Hypokalemia Hypomagnesemia Resolved      Data Reviewed: Basic Metabolic Panel: No results for input(s): NA, K, CL, CO2, GLUCOSE, BUN, CREATININE, CALCIUM, MG, PHOS in the last 168 hours.  Liver Function Tests: No results for input(s): AST, ALT, ALKPHOS, BILITOT, PROT, ALBUMIN in the last 168 hours.  No results for input(s): LIPASE, AMYLASE in the last 168 hours. No results for input(s): AMMONIA in the last 168 hours. CBC: No results for input(s): WBC, NEUTROABS, HGB, HCT, MCV, PLT in the last 168 hours.     CBG: Recent Labs  Lab 03/22/21 1228 03/22/21 1624 03/22/21 2154 03/23/21 0637 03/23/21 0650  GLUCAP 144* 164* 149* 120* 115*    Scheduled Meds:  enoxaparin (LOVENOX) injection  40 mg Subcutaneous Q24H   feeding supplement (GLUCERNA SHAKE)  237 mL Oral TID BM   free water  100 mL Per Tube Q8H   hydrocerin   Topical BID   insulin aspart  0-15 Units Subcutaneous TID WC   insulin aspart  0-5 Units Subcutaneous QHS   insulin aspart  8 Units Subcutaneous TID WC   insulin glargine-yfgn  30 Units Subcutaneous Daily   levETIRAcetam  1,000 mg Oral BID   loratadine  10 mg Oral Daily   LORazepam  0.5 mg Oral QAC breakfast   metFORMIN  500 mg Oral BID WC   metoprolol succinate  12.5 mg Oral Daily   multivitamin with minerals  1 tablet Oral Daily   polyethylene glycol  17 g Oral BID   risperiDONE  2 mg Oral QHS   senna-docusate  1 tablet Oral BID   valACYclovir  500 mg Oral QHS      Principal Problem:   Acute encephalopathy Active Problems:   Hypoglycemia   Constipation   Bipolar disorder (HCC)    Diabetes (HCC)   AMS (altered mental status)   Poor appetite   Psychogenic loss of appetite   Intellectual delay   Consultants: Neurology Psychiatry  Procedures: EEG Long-term EEG  Antibiotics: Valacyclovir 8/15 through 8/28   Time spent: 15 minutes    Marissa Silk ANP  [facility] 7 am - 330 pm/M-F for direct patient care and secure chat Please refer to Amion for contact info 57  days

## 2021-03-24 DIAGNOSIS — G9340 Encephalopathy, unspecified: Secondary | ICD-10-CM | POA: Diagnosis not present

## 2021-03-24 LAB — GLUCOSE, CAPILLARY
Glucose-Capillary: 94 mg/dL (ref 70–99)
Glucose-Capillary: 165 mg/dL — ABNORMAL HIGH (ref 70–99)
Glucose-Capillary: 174 mg/dL — ABNORMAL HIGH (ref 70–99)
Glucose-Capillary: 112 mg/dL — ABNORMAL HIGH (ref 70–99)

## 2021-03-24 NOTE — Progress Notes (Signed)
[facility] PROGRESS NOTE  [name] JJO:[id number] DOB: [birth year] DOA: 01/23/2021 PCP: Mirna Mires, MD  Status: Remains inpatient appropriate because:Altered mental status, Unsafe d/c plan, IV treatments appropriate due to intensity of illness or inability to take PO, and Inpatient level of care appropriate due to severity of illness  Dispo: The patient is from: Home              Anticipated d/c is to: Home: Sister who states she can provide 24/7 care with assistance of family-still attempting to get in contact with sister to confirm 24/7 care is definitely in place              Patient currently is not medically stable to d/c.Marland Kitchen  She has improved significantly with mobility but is not eligible for home health services therefore will benefit from continued STAR/aggressive therapies prior to discharge   Difficult to place patient Yes   Level of care: Med-Surg  Code Status: Full Family Communication: Sister [name]-10/3.  Updated that current discharge plan is for patient to discharge home with outpatient PT on 10/7 DVT prophylaxis: Lovenox COVID vaccination status: Unknown   HPI: 55 y.o. F with DM, HTN, developmental delay who presented with decreased oral intake and "saying bizarre things". In the ER, CT head unremarkable.  She was hypoglycemic and otherwise had normal chemistries and hgb  Admitted for evaluation of erratic behavior.  Since admission patient has had a significant neurological work-up that did not reveal etiology to her symptoms.  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  She was started on Ativan with significant improvement in symptoms.  Ativan was tapered and DC'd but unfortunately catatonia symptoms recurred.  Patient's alertness once again improved after she was placed on low-dose scheduled Ativan daily.  Currently she is working aggressively with PT and OT and plans are to  discharge the patient home on 10/7 with home health PT and potentially CAP or PCS services pending eligibility  Subjective: Patient alert.  Sitting up in the bed.  Very happy she is going home tomorrow.  Objective: Vitals:   03/24/21 0354 03/24/21 0743  BP: 103/68 95/64  Pulse: 82 99  Resp: 20 14  Temp: 98.2 F (36.8 C) 98.2 F (36.8 C)  SpO2: 98% 100%    Intake/Output Summary (Last 24 hours) at 03/24/2021 0820 Last data filed at 03/24/2021 0810 Gross per 24 hour  Intake 713 ml  Output 2 ml  Net 711 ml   Filed Weights   02/20/21 0432 02/22/21 0651 03/09/21 0612  Weight: 73.5 kg 71.6 kg 71.7 kg    Exam: Constitutional: NAD, alert-sitting up in chair Respiratory: CTA, RA no increased WOB Cardiac: S1-S2, normotensive, regular pulse, skin w/d  Abdomen: PEG tube site unremarkable -LBM 10/05 -eating 100% of meals.  Abdomen soft with normoactive bowel sounds. Neurologic: Cranial nerves II through XII are intact.  Patient moving all extremities equally with strength 4-5/5.  Sensation intact. 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 Patient admitted with encephalopathy/behavioral change with imaging, metabolic work-up and EEG unremarkable Psychiatry dx'd catatonia that resolved with Ativan Continue preadmission Risperdal  Significant constipation with large volume stool in rectal vault Soapsuds enema x1 with significant results Continue senna docusate to BID 9/29 Continue MiraLAX to bid on 9/20 Continues to have regular bowel movements  Poor oral intake 2/2 fixed delusions/Malnutrition/Obesity Resolved PEG  tube placed on 8/29 -tube can be removed in 6-8 weeks per IR-   Seizure disorder POA Continue keppra Recent EEG unremarkable.    Physical deconditioning  Patient now able to navigate up to 19+ stairs with minimal assist.  Still needs 24/7 care in  regards to impulsivity.   She is not eligible for home health services because she has Medicaid   Diabetes mellitus 2 uncontrolled on long-term insulin Continue Semglee and SSI Prior to admission patient was on Lantus 40 units HS (we are using Semglee ) Continue metformin and Amaryl   Hypokalemia Hypomagnesemia Resolved      Data Reviewed: Basic Metabolic Panel: No results for input(s): NA, K, CL, CO2, GLUCOSE, BUN, CREATININE, CALCIUM, MG, PHOS in the last 168 hours.  Liver Function Tests: No results for input(s): AST, ALT, ALKPHOS, BILITOT, PROT, ALBUMIN in the last 168 hours.  No results for input(s): LIPASE, AMYLASE in the last 168 hours. No results for input(s): AMMONIA in the last 168 hours. CBC: No results for input(s): WBC, NEUTROABS, HGB, HCT, MCV, PLT in the last 168 hours.     CBG: Recent Labs  Lab 03/23/21 0650 03/23/21 1129 03/23/21 1656 03/23/21 2132 03/24/21 0627  GLUCAP 115* 162* 163* 188* 94    Scheduled Meds:  enoxaparin (LOVENOX) injection  40 mg Subcutaneous Q24H   feeding supplement (GLUCERNA SHAKE)  237 mL Oral TID BM   free water  100 mL Per Tube Q8H   hydrocerin   Topical BID   insulin aspart  0-15 Units Subcutaneous TID WC   insulin aspart  0-5 Units Subcutaneous QHS   insulin aspart  8 Units Subcutaneous TID WC   insulin glargine-yfgn  30 Units Subcutaneous Daily   levETIRAcetam  1,000 mg Oral BID   loratadine  10 mg Oral Daily   LORazepam  0.5 mg Oral QAC breakfast   metFORMIN  500 mg Oral BID WC   metoprolol succinate  12.5 mg Oral Daily   multivitamin with minerals  1 tablet Oral Daily   polyethylene glycol  17 g Oral BID   risperiDONE  2 mg Oral QHS   senna-docusate  1 tablet Oral BID   valACYclovir  500 mg Oral QHS      Principal Problem:   Acute encephalopathy Active Problems:   Hypoglycemia   Constipation   Bipolar disorder (HCC)   Diabetes (HCC)   AMS (altered mental status)   Poor appetite   Psychogenic loss of  appetite   Intellectual delay   Consultants: Neurology Psychiatry  Procedures: EEG Long-term EEG  Antibiotics: Valacyclovir 8/15 through 8/28   Time spent: 15 minutes    Junious Silk ANP  [facility] 7 am - 330 pm/M-F for direct patient care and secure chat Please refer to Amion for contact info 58  days

## 2021-03-24 NOTE — Progress Notes (Signed)
Occupational Therapy Treatment Patient Details Name: [name] MRN: [id number] DOB: 1966/03/18 Today's Date: 03/24/2021   History of present illness Pt is a 55 y/o female admitted for neurological evaluation of suspected encephalopathy.  However, brain imaging was normal, EEG unremarkable, UA negative, urine drug screen negative, procalcitonin low, B12, ammonia, and TSH normal.  Acute metabolic encephalopathy was ruled out. Progressive mental decline over previous 2 days so another EEG and CT head performed 8/30 with normal results, no sign of seizures. Improving mental status 02/18/21. PMH: intellectual disability, bipolar disorder, seizure disorder, type II diabetes, hypertension, hyperlipidemia, immune deficiency disorder.   OT comments  Patient received in recliner.  Patient required verbal cues to push up to stand and to lock and unlock rollator brakes. Patient performed self care standing at sink with patient sitting to bathe lower legs and feet.  Patient continues to requires verbal cues for sequencing, often stating, "I'm done" before completing tasks.  Acute OT to continue to follow.    Recommendations for follow up therapy are one component of a multi-disciplinary discharge planning process, led by the attending physician.  Recommendations may be updated based on patient status, additional functional criteria and insurance authorization.    Follow Up Recommendations  Home health OT    Equipment Recommendations  3 in 1 bedside commode;Tub/shower seat    Recommendations for Other Services      Precautions / Restrictions Precautions Precautions: Fall Precaution Comments: PEG tube       Mobility Bed Mobility Overal bed mobility: Modified Independent             General bed mobility comments: pt received in chair    Transfers Overall transfer level: Needs assistance Equipment used: 4-wheeled walker Transfers: Sit to/from Stand Sit to Stand: Supervision Stand pivot  transfers: Supervision;Min guard       General transfer comment: rquires verbal cues for locking and unlocking rollator brakes and to reach back before sitting    Balance Overall balance assessment: Needs assistance Sitting-balance support: Feet supported Sitting balance-Leahy Scale: Good     Standing balance support: No upper extremity supported;During functional activity Standing balance-Leahy Scale: Fair Standing balance comment: min guard with rollator use                           ADL either performed or assessed with clinical judgement   ADL Overall ADL's : Needs assistance/impaired     Grooming: Wash/dry hands;Wash/dry face;Oral care;Applying deodorant;Cueing for sequencing;Standing;Supervision/safety Grooming Details (indicate cue type and reason): continues to require cues for sequencing Upper Body Bathing: Supervision/ safety;Standing   Lower Body Bathing: Sitting/lateral leans;Sit to/from stand;Supervison/ safety Lower Body Bathing Details (indicate cue type and reason): bathed lower legs seated Upper Body Dressing : Supervision/safety;Sitting   Lower Body Dressing: Minimal assistance Lower Body Dressing Details (indicate cue type and reason): requred assistance with pull up brief             Functional mobility during ADLs: Supervision/safety;Min guard General ADL Comments: cues for safety with rollator     Vision       Perception     Praxis      Cognition Arousal/Alertness: Awake/alert Behavior During Therapy: Flat affect Overall Cognitive Status: Impaired/Different from baseline Area of Impairment: Attention;Memory;Following commands;Problem solving                 Orientation Level: Disoriented to;Time Current Attention Level: Focused Memory: Decreased recall of precautions;Decreased short-term memory Following Commands:  Follows one step commands consistently;Follows multi-step commands inconsistently;Follows one step  commands with increased time Safety/Judgement: Decreased awareness of deficits;Decreased awareness of safety Awareness: Intellectual Problem Solving: Slow processing;Requires verbal cues;Requires tactile cues;Decreased initiation;Difficulty sequencing General Comments: requires step by step cues for safety with rollator        Exercises     Shoulder Instructions       General Comments      Pertinent Vitals/ Pain       Pain Assessment: No/denies pain  Home Living                                          Prior Functioning/Environment              Frequency  Min 2X/week        Progress Toward Goals  OT Goals(current goals can now be found in the care plan section)  Progress towards OT goals: Progressing toward goals  Acute Rehab OT Goals Patient Stated Goal: Not stated OT Goal Formulation: With patient Time For Goal Achievement: 03/29/21 Potential to Achieve Goals: Good ADL Goals Pt Will Perform Grooming: with min guard assist;sitting Pt Will Perform Upper Body Bathing: with min assist;sitting Pt Will Perform Lower Body Bathing: with supervision;sit to/from stand Pt Will Perform Upper Body Dressing: with min assist;sitting Pt Will Perform Lower Body Dressing: with supervision;sit to/from stand Pt Will Transfer to Toilet: with modified independence Pt Will Perform Toileting - Clothing Manipulation and hygiene: with supervision;sitting/lateral leans Additional ADL Goal #1: Pt will complete grooming tasks in standing position at sink level with min vc Additional ADL Goal #2: [name] will verbalize understanding of how to assist pt with ADL tasks to maximize safety and funcitonal level of independence Additional ADL Goal #3: Staff/caregiver will implement bowel/bladder program, toileting pt q 2 hrs when awake to increase awareness of bowel/bladder control adn reduce frequencies of incontinence episodes.  Plan Discharge plan remains  appropriate;Frequency needs to be updated    Co-evaluation                 AM-PAC OT "6 Clicks" Daily Activity     Outcome Measure   Help from another person eating meals?: None Help from another person taking care of personal grooming?: A Little Help from another person toileting, which includes using toliet, bedpan, or urinal?: A Little Help from another person bathing (including washing, rinsing, drying)?: A Little Help from another person to put on and taking off regular upper body clothing?: A Little Help from another person to put on and taking off regular lower body clothing?: A Little 6 Click Score: 19    End of Session Equipment Utilized During Treatment: Rolling walker  OT Visit Diagnosis: Other symptoms and signs involving cognitive function;Muscle weakness (generalized) (M62.81)   Activity Tolerance Patient tolerated treatment well   Patient Left in chair;with call bell/phone within reach;with chair alarm set   Nurse Communication Mobility status        Time: 6010-9323 OT Time Calculation (min): 28 min  Charges: OT General Charges $OT Visit: 1 Visit OT Treatments $Self Care/Home Management : 23-37 mins  Alfonse Flavors, OTA   [name] 03/24/2021, 8:15 AM

## 2021-03-24 NOTE — Plan of Care (Signed)

## 2021-03-25 DIAGNOSIS — G9340 Encephalopathy, unspecified: Secondary | ICD-10-CM | POA: Diagnosis not present

## 2021-03-25 LAB — GLUCOSE, CAPILLARY
Glucose-Capillary: 108 mg/dL — ABNORMAL HIGH (ref 70–99)
Glucose-Capillary: 191 mg/dL — ABNORMAL HIGH (ref 70–99)
Glucose-Capillary: 106 mg/dL — ABNORMAL HIGH (ref 70–99)

## 2021-03-25 NOTE — Discharge Summary (Signed)
Physician Discharge Summary  YONA BODLEY LPF:[id number] DOB: [birth year] DOA: 01/23/2021  PCP: Mirna Mires, MD  Admit date: 01/23/2021 Discharge date: 03/25/2021  Time spent: 35 minutes  Recommendations for Outpatient Follow-up:  Will follow-up with [facility] and regarding to need for care coordination for her IDD.  Valentino Nose will contact the patient and family after discharge. Patient has been referred to the outpatient rehabilitation center on [facility].  Family is to call and schedule therapy times with the outpatient rehabilitation facility DME will be provided by [employer]: Rollator, 3:1, and tub/shower seat to be sent home with the patient at time of discharge Patient needs to follow-up with her primary care physician Dr. Loleta Chance.  Family needs to schedule appointment for 7 to 10 days after discharge. Patient will need to follow-up with the [facility] interventional radiology department regarding removal of PEG tube.  [facility] referral has been sent to interventional radiology and they will be calling the patient and family with appointment date and time Application has been sent to the CAP/DA program.  It would take about 3 to 4 months to have services begin at the home.  Coordinator will reach out at a later date for an assessment session and start of care. Patient is cleared to return to [facility] day program   Discharge Diagnoses:  Principal Problem:   Acute encephalopathy Active Problems:   Hypoglycemia   Constipation   Bipolar disorder (HCC)   Diabetes (HCC)   AMS (altered mental status)   Poor appetite   Psychogenic loss of appetite   Intellectual delay    Discharge Condition: Stable  Diet recommendation: Carbohydrate modified/diabetic diet  Filed Weights   02/20/21 0432 02/22/21 0651 03/09/21 0612  Weight: 73.5 kg 71.6 kg 71.7 kg    History of present illness:  55 y.o. F with DM, HTN, developmental delay who  presented with decreased oral intake and "saying bizarre things". In the ER, CT head unremarkable.  She was hypoglycemic and otherwise had normal chemistries and hgb  Admitted for evaluation of erratic behavior.  Since admission patient has had a significant neurological work-up that did not reveal etiology to her symptoms.  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  She was started on Ativan with significant improvement in symptoms.  Ativan was tapered and DC'd but unfortunately catatonia symptoms recurred.  Patient's alertness once again improved after she was placed on low-dose scheduled Ativan daily.  On date of discharge patient able to ambulate well with a Rollator and continues to require cues regarding to her underlying developmental disability and impulsivity.  She will continue with outpatient PT upon discharge and it is anticipated she should be able to return to the day program at [facility].  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     Physical deconditioning  Patient now able to navigate up to 19+ stairs with  minimal assist.  Still needs 24/7 care in regards to impulsivity.   Appropriate DME has been secured for the patient. She has been set up for outpatient PT She is not eligible for home health services because she has Medicaid   Diabetes mellitus 2 uncontrolled on long-term insulin Continue preadmission Lantus but at a slightly lower dose of 30 units daily Patient will continue preadmission SSI as well as new meal coverage Continue metformin and Amaryl as prior to admission  Hypokalemia Hypomagnesemia Resolved     Procedures: EEG Long-term/continuous EEG   Consultations: Neurology Psychiatry  Antibiotics: Valacyclovir 8/15 through 8/28   Discharge Exam: Vitals:   03/25/21 0301 03/25/21 0744  BP: 109/71 109/67  Pulse: 90 95  Resp: 18 16  Temp: 98.3 F (36.8 C) 98.2 F (36.8 C)  SpO2: 98% 98%   Constitutional: NAD, alert-sitting up in chair-eager to discharge home today Respiratory: Lung sounds remain CTA, stable on RA respiratory effort Cardiac: S1-S2, normotensive, regular pulse, skin w/d -no peripheral edema Abdomen: PEG tube site unremarkable -LBM 10/07 -eating 100% of meals.  Abdomen soft with normoactive bowel sounds. Neurologic: Cranial nerves II through XII are intact.  Patient moving all extremities equally with strength 4-5/5.  Sensation intact. Psychiatric: Awake and and alert, interactive      Discharge Instructions   Discharge Instructions     Ambulatory referral to Interventional Radiology   Complete by: As directed    No longer utilizing PEG tube.  Based on 8/29.  Discharging home on 10/7 and will need to eventually have PEG tube removed when timing is appropriate.   Ambulatory referral to Physical Therapy   Complete by: As directed    Iontophoresis - 4 mg/ml of dexamethasone: No   T.E.N.S. Unit Evaluation and Dispense as Indicated: No   Call MD for:  extreme fatigue   Complete by: As directed    Call MD for:  persistant nausea and vomiting    Complete by: As directed    Call MD for:  temperature >100.4   Complete by: As directed    Diet Carb Modified   Complete by: As directed    Discharge instructions   Complete by: As directed    Please make sure you attend all doctors appointments.  Please call the doctors that you do not have an appointment date and time.  Some of the doctors offices will be calling you and this information will be provided in the discharge paperwork  Continue to use the Rollator while walking.  She is okay to return to the day program at [facility] house from a medical perspective  Several medications were either adjusted, stopped or changed.  He is on a slightly lower dose of Lantus insulin and we have added regular insulin for meal coverage.  She has not had any issues that would require her to need a diuretic so the Lasix along with potassium have been stopped.  We have also placed her on 2 laxative medications twice per day.  If she continues to have difficulty with constipation her primary care doctor may need to add additional medications.   Increase activity slowly   Complete by: As directed       Allergies as of 03/25/2021       Reactions   Penicillins    Sulfa Antibiotics    Tomato  Medication List     STOP taking these medications    furosemide 20 MG tablet Commonly known as: LASIX   potassium chloride 10 MEQ tablet Commonly known as: KLOR-CON   simvastatin 20 MG tablet Commonly known as: ZOCOR       TAKE these medications    acetaminophen 325 MG tablet Commonly known as: TYLENOL Take 2 tablets (650 mg total) by mouth every 6 (six) hours as needed for mild pain (or Fever >/= 101).   glimepiride 2 MG tablet Commonly known as: AMARYL Take 2 mg by mouth daily before breakfast.   insulin aspart 100 UNIT/ML injection Commonly known as: novoLOG Inject 8 Units into the skin 3 (three) times daily with meals. What changed: You were already taking a medication with the  same name, and this prescription was added. Make sure you understand how and when to take each.   insulin aspart 100 UNIT/ML injection Commonly known as: novoLOG Inject 1-7 Units into the skin 3 (three) times daily as needed for high blood sugar (Per sliding scale: 120-150=1 unit, 150-200=2 units, 201-250=4 units, 251-300=6 units). What changed: Another medication with the same name was added. Make sure you understand how and when to take each.   insulin glargine 100 unit/mL Sopn Commonly known as: LANTUS Inject 30 Units into the skin at bedtime. What changed: how much to take   levETIRAcetam 1000 MG tablet Commonly known as: KEPPRA Take 1 tablet (1,000 mg total) by mouth 2 (two) times daily.   loratadine 10 MG tablet Commonly known as: CLARITIN Take 10 mg by mouth daily.   LORazepam 0.5 MG tablet Commonly known as: ATIVAN Take 1 tablet (0.5 mg total) by mouth daily before breakfast.   metFORMIN 500 MG tablet Commonly known as: GLUCOPHAGE Take 500 mg by mouth 2 (two) times daily with a meal.   metoprolol succinate 25 MG 24 hr tablet Commonly known as: TOPROL-XL Take 0.5 tablets (12.5 mg total) by mouth daily.   multivitamin with minerals Tabs tablet Take 1 tablet by mouth daily.   polyethylene glycol 17 g packet Commonly known as: MIRALAX / GLYCOLAX Take 17 g by mouth 2 (two) times daily.   risperiDONE 2 MG tablet Commonly known as: RISPERDAL Take 1 tablet (2 mg total) by mouth at bedtime.   senna-docusate 8.6-50 MG tablet Commonly known as: Senokot-S Take 1 tablet by mouth 2 (two) times daily. 60   traZODone 100 MG tablet Commonly known as: DESYREL Take 1 tablet (100 mg total) by mouth at bedtime as needed for sleep.   valACYclovir 500 MG tablet Commonly known as: VALTREX Take 500 mg by mouth at bedtime.               Durable Medical Equipment  (From admission, onward)           Start     Ordered   02/25/21 1313  For home use only DME Shower  stool  Once        02/25/21 1312   02/25/21 1312  For home use only DME 3 n 1  Once        02/25/21 1312   02/25/21 1311  For home use only DME 4 wheeled rolling walker with seat  Once       Question:  Patient needs a walker to treat with the following condition  Answer:  Generalized weakness   02/25/21 1312           Allergies  Allergen Reactions  Penicillins    Sulfa Antibiotics    Tomato     Follow-up Information     [facility] Coordination Follow up.   Why: A referral was placed for a Community education officer regarding your need for a care coordinator relating to your IDD. Contact information: Raymond Gurney  fax [phone] email - [email or web address]  office - [phone]        [facility] Call.   Specialty: Rehabilitation Why: Please call and schedule therapy times with the outpatient rehabilitation facility. Contact information: [address] [id number] mc [address] [place] [address] [phone]        [facility] [employer] Follow up.   Why: [employer] is providing a rollator, 3:1 and tub/shower seat to the patient's hospital room to take home upon discharge from the hospital. [address] [place] [address] [phone]         Mirna Mires, MD. Schedule an appointment as soon as possible for a visit.   Specialty: Family Medicine Why: Please call and schedule a hospital follow up for 7-10 days after discharge home from the hospital. Contact information: [address] [place] [address] [phone]         [facility] Follow up.   Why: Interventional radiology department will call with appointment to have feeding tube removed Contact information: [address] [place] [address] [phone]         CAP/DA Program Follow up.   Why: CAP/DA program will take about 3-4 months to start  services at the home.  A coordinator which reach out at a later date for assessment and start of care. Contact information: Coordinator at [facility] office - [phone]                 The results of significant diagnostics from this hospitalization (including imaging, microbiology, ancillary and laboratory) are listed below for reference.    Significant Diagnostic Studies: CT HEAD WO CONTRAST ( )  Result Date: 02/27/2021 CLINICAL DATA:  55 year old female with unexplained altered mental status. EXAM: CT HEAD WITHOUT CONTRAST TECHNIQUE: Contiguous axial images were obtained from the base of the skull through the vertex without intravenous contrast. COMPARISON:  Brain MRI 01/24/2021.  Head CT 02/15/2021. FINDINGS: Brain: Normal cerebral volume. No midline shift, ventriculomegaly, mass effect, evidence of mass lesion, intracranial hemorrhage or evidence of cortically based acute infarction. Gray-white matter differentiation is within normal limits throughout the brain. Partially empty sella. Vascular: No suspicious intracranial vascular hyperdensity. Skull: Stable hyperostosis. Sinuses/Orbits: Visualized paranasal sinuses and mastoids are stable and well aerated. Small right maxillary mucous retention cysts. Other: Visualized orbits and scalp soft tissues are within normal limits. IMPRESSION: Stable and negative noncontrast head CT. Electronically Signed   By: Odessa Fleming M.D.   On: 02/27/2021 09:37   EEG adult  Result Date: 03/01/2021 Charlsie Quest, MD     03/01/2021  3:12 PM Patient Name: KIERSTON PLASENCIA MRN: [id number] Epilepsy Attending: Charlsie Quest Referring Physician/Provider: Junious Silk, NP Date: 03/01/2021 Duration: 23.04 mins Patient history: 55 year old female with history of seizures and altered mental status.  EEG to evaluate for seizures. Level of alertness: Awake, asleep AEDs during EEG study: Keppra, Ativan Technical aspects: This EEG study was done with scalp electrodes  positioned according to the 10-20 International system of electrode placement. Electrical activity was acquired at a sampling rate of 500Hz  and reviewed with a high frequency filter of 70Hz  and a low  frequency filter of 1Hz . EEG data were recorded continuously and digitally stored. Description: The posterior dominant rhythm consists of 10 Hz activity of moderate voltage (25-35 uV) seen predominantly in posterior head regions, symmetric and reactive to eye opening and eye closing. Sleep was characterized by vertex waves, sleep spindles (12 to 14 Hz), maximal frontocentral region.  Hyperventilation and photic stimulation were not performed.   IMPRESSION: This study is within normal limits. No seizures or epileptiform discharges were seen throughout the recording.      Labs: CBG: Recent Labs  Lab 03/24/21 1140 03/24/21 1632 03/24/21 2142 03/25/21 0620 03/25/21 0752  GLUCAP 165* 174* 112* 108* 191*       Signed:  05/25/21 ANP [facility] 03/25/2021, 10:20 AM

## 2021-03-25 NOTE — Progress Notes (Signed)
Patient D/C home with sister, discharge information provided to and explained to sister. Patient left with all DME equipment.

## 2021-03-25 NOTE — Progress Notes (Signed)
CSW spoke with patient's sister to confirm she is picking up the patient today at 12:30pm.  CSW notified RN of information.  Edwin Dada, MSW, LCSW Transitions of Care  Clinical Social Worker II [phone]

## 2021-03-25 NOTE — Plan of Care (Signed)
  Problem: Education: Goal: Knowledge of General Education information will improve Description: Including pain rating scale, medication(s)/side effects and non-pharmacologic comfort measures Outcome: Adequate for Discharge   Problem: Health Behavior/Discharge Planning: Goal: Ability to manage health-related needs will improve Outcome: Adequate for Discharge   Problem: Clinical Measurements: Goal: Ability to maintain clinical measurements within normal limits will improve Outcome: Adequate for Discharge Goal: Will remain free from infection Outcome: Adequate for Discharge Goal: Diagnostic test results will improve Outcome: Adequate for Discharge Goal: Respiratory complications will improve Outcome: Adequate for Discharge Goal: Cardiovascular complication will be avoided Outcome: Adequate for Discharge   Problem: Activity: Goal: Risk for activity intolerance will decrease Outcome: Adequate for Discharge   Problem: Nutrition: Goal: Adequate nutrition will be maintained Outcome: Adequate for Discharge   Problem: Pain Managment: Goal: General experience of comfort will improve Outcome: Adequate for Discharge   Problem: Elimination: Goal: Will not experience complications related to bowel motility Outcome: Adequate for Discharge Goal: Will not experience complications related to urinary retention Outcome: Adequate for Discharge   Problem: Safety: Goal: Non-violent Restraint(s) Outcome: Adequate for Discharge   Problem: Coping: Goal: Ability to adjust to condition or change in health will improve Outcome: Adequate for Discharge Goal: Ability to identify appropriate support needs will improve Outcome: Adequate for Discharge   Problem: Education: Goal: Expressions of having a comfortable level of knowledge regarding the disease process will increase Outcome: Adequate for Discharge   Problem: Health Behavior/Discharge Planning: Goal: Compliance with prescribed medication  regimen will improve Outcome: Adequate for Discharge   Problem: Medication: Goal: Risk for medication side effects will decrease Outcome: Adequate for Discharge   Problem: Safety: Goal: Verbalization of understanding the information provided will improve Outcome: Adequate for Discharge   Problem: Self-Concept: Goal: Level of anxiety will decrease Outcome: Adequate for Discharge Goal: Ability to verbalize feelings about condition will improve Outcome: Adequate for Discharge

## 2021-03-28 ENCOUNTER — Other Ambulatory Visit: Payer: Self-pay

## 2021-03-28 ENCOUNTER — Inpatient Hospital Stay (HOSPITAL_COMMUNITY): Admission: RE | Admit: 2021-03-28 | Payer: Medicaid Other | Source: Ambulatory Visit

## 2021-03-28 ENCOUNTER — Ambulatory Visit (HOSPITAL_COMMUNITY)
Admission: RE | Admit: 2021-03-28 | Discharge: 2021-03-28 | Disposition: A | Discharge status: Home / Self Care | Payer: Medicaid Other | Source: Ambulatory Visit | Attending: Nurse Practitioner | Admitting: Nurse Practitioner

## 2021-03-28 ENCOUNTER — Other Ambulatory Visit (HOSPITAL_COMMUNITY): Payer: Self-pay | Admitting: Nurse Practitioner

## 2021-03-28 DIAGNOSIS — Z431 Encounter for attention to gastrostomy: Secondary | ICD-10-CM | POA: Insufficient documentation

## 2021-03-28 HISTORY — PX: IR GASTROSTOMY TUBE REMOVAL: IMG5492

## 2021-03-28 MED ORDER — LIDOCAINE VISCOUS HCL 2 % MT SOLN
OROMUCOSAL | Status: DC | PRN
  Administered 2021-03-28: 10 mL via OROMUCOSAL

## 2021-03-28 MED ORDER — LIDOCAINE VISCOUS HCL 2 % MT SOLN
OROMUCOSAL | Status: AC
  Filled 2021-03-28: qty 15

## 2021-03-28 NOTE — Progress Notes (Signed)
  Successful bedside removal of intact pull through G-tube. No immediate post procedural complications. Gauze dressing placed over site.  Pt provided with care instructions and verbalized understanding of care.  EBL <5cc  Shon Hough, NP 03/28/2021 10:13 AM

## 2022-08-20 ENCOUNTER — Emergency Department (HOSPITAL_COMMUNITY)
Admission: EM | Admit: 2022-08-20 | Discharge: 2022-08-21 | Disposition: A | Discharge status: Home / Self Care | Payer: Medicaid Other | Attending: Emergency Medicine | Admitting: Emergency Medicine

## 2022-08-20 ENCOUNTER — Other Ambulatory Visit: Payer: Self-pay

## 2022-08-20 DIAGNOSIS — R569 Unspecified convulsions: Secondary | ICD-10-CM

## 2022-08-20 DIAGNOSIS — R0602 Shortness of breath: Secondary | ICD-10-CM | POA: Insufficient documentation

## 2022-08-20 DIAGNOSIS — K117 Disturbances of salivary secretion: Secondary | ICD-10-CM | POA: Diagnosis present

## 2022-08-20 DIAGNOSIS — M2140 Flat foot [pes planus] (acquired), unspecified foot: Secondary | ICD-10-CM | POA: Insufficient documentation

## 2022-08-20 DIAGNOSIS — Z794 Long term (current) use of insulin: Secondary | ICD-10-CM | POA: Diagnosis not present

## 2022-08-20 LAB — DIFFERENTIAL
Abs Immature Granulocytes: 0.05 10*3/uL (ref 0.00–0.07)
Basophils Absolute: 0.1 10*3/uL (ref 0.0–0.1)
Basophils Relative: 1 %
Eosinophils Absolute: 0.1 10*3/uL (ref 0.0–0.5)
Eosinophils Relative: 1 %
Immature Granulocytes: 1 %
Lymphocytes Relative: 30 %
Lymphs Abs: 2.7 10*3/uL (ref 0.7–4.0)
Monocytes Absolute: 0.6 10*3/uL (ref 0.1–1.0)
Monocytes Relative: 7 %
Neutro Abs: 5.5 10*3/uL (ref 1.7–7.7)
Neutrophils Relative %: 60 %

## 2022-08-20 LAB — CBC
HCT: 40.5 % (ref 36.0–46.0)
Hemoglobin: 13.1 g/dL (ref 12.0–15.0)
MCH: 31 pg (ref 26.0–34.0)
MCHC: 32.3 g/dL (ref 30.0–36.0)
MCV: 96 fL (ref 80.0–100.0)
Platelets: 309 10*3/uL (ref 150–400)
RBC: 4.22 MIL/uL (ref 3.87–5.11)
RDW: 13.1 % (ref 11.5–15.5)
WBC: 9 10*3/uL (ref 4.0–10.5)
nRBC: 0 % (ref 0.0–0.2)

## 2022-08-20 LAB — URINALYSIS, ROUTINE W REFLEX MICROSCOPIC
Bacteria, UA: NONE SEEN
Bilirubin Urine: NEGATIVE
Glucose, UA: >=500 mg/dL — AB
Hgb urine dipstick: NEGATIVE
Ketones, ur: NEGATIVE mg/dL
Leukocytes,Ua: NEGATIVE
Nitrite: NEGATIVE
Protein, ur: NEGATIVE mg/dL
Specific Gravity, Urine: 1.021 (ref 1.005–1.030)
pH: 5 (ref 5.0–8.0)

## 2022-08-20 LAB — I-STAT CHEM 8, ED
BUN: 15 mg/dL (ref 6–20)
Calcium, Ion: 1.09 mmol/L — ABNORMAL LOW (ref 1.15–1.40)
Chloride: 100 mmol/L (ref 98–111)
Creatinine, Ser: 0.7 mg/dL (ref 0.44–1.00)
Glucose, Bld: 277 mg/dL — ABNORMAL HIGH (ref 70–99)
HCT: 42 % (ref 36.0–46.0)
Hemoglobin: 14.3 g/dL (ref 12.0–15.0)
Potassium: 4.6 mmol/L (ref 3.5–5.1)
Sodium: 136 mmol/L (ref 135–145)
TCO2: 30 mmol/L (ref 22–32)

## 2022-08-20 LAB — APTT: aPTT: 24 seconds (ref 24–36)

## 2022-08-20 LAB — ETHANOL: Alcohol, Ethyl (B): <10 mg/dL (ref <10)

## 2022-08-20 LAB — PROTIME-INR
INR: 1 (ref 0.8–1.2)
Prothrombin Time: 12.8 seconds (ref 11.4–15.2)

## 2022-08-20 LAB — CBG MONITORING, ED: Glucose-Capillary: 268 mg/dL — ABNORMAL HIGH (ref 70–99)

## 2022-08-20 MED ORDER — SODIUM CHLORIDE 0.9 % IV BOLUS
1000.0000 mL | Freq: Once | INTRAVENOUS | Status: AC
  Administered 2022-08-20: 1000 mL via INTRAVENOUS

## 2022-08-20 MED ORDER — LORAZEPAM 2 MG/ML IJ SOLN
1.0000 mg | Freq: Once | INTRAMUSCULAR | Status: AC
  Administered 2022-08-20: 1 mg via INTRAVENOUS
  Filled 2022-08-20: qty 1

## 2022-08-20 NOTE — ED Provider Notes (Signed)
[facility] Provider Note   CSN: [id number] Arrival date & time: 08/20/22  2103     History  Chief Complaint  Patient presents with   Drooling    [name] is a 57 y.o. female.  History of cognitive disability, unable to provide much history.  History who is her caretaker is able to provide [place] with history.  She is brought in by EMS for drooling and not acting like herself that started yesterday around 5 PM while they are watching a movie.  She states she is intermittently not responding to questions and drooling but will then start actively herself again.  She does have history of stroke but also has history of bipolar and severe depression her sister reports.  She has been compliant with all of her medications.  She is normally independent with her ADLs, her sister only helps her manage her medications.  HPI     Home Medications Prior to Admission medications   Medication Sig Start Date End Date Taking? Authorizing Provider  acetaminophen (TYLENOL) 325 MG tablet Take 2 tablets (650 mg total) by mouth every 6 (six) hours as needed for mild pain (or Fever >/= 101). 03/23/21   Samella Parr, NP  glimepiride (AMARYL) 2 MG tablet Take 2 mg by mouth daily before breakfast.    [provider]  insulin aspart (NOVOLOG) 100 UNIT/ML injection Inject 1-7 Units into the skin 3 (three) times daily as needed for high blood sugar (Per sliding scale: 120-150=1 unit, 150-200=2 units, 201-250=4 units, 251-300=6 units).    [provider]  insulin aspart (NOVOLOG) 100 UNIT/ML injection Inject 8 Units into the skin 3 (three) times daily with meals. 03/23/21   Samella Parr, NP  insulin glargine (LANTUS) 100 unit/mL SOPN Inject 30 Units into the skin at bedtime. 03/23/21   Samella Parr, NP  levETIRAcetam (KEPPRA) 1000 MG tablet Take 1 tablet (1,000 mg total) by mouth 2 (two) times daily. 11/03/20 12/03/20  Donne Hazel, MD   loratadine (CLARITIN) 10 MG tablet Take 10 mg by mouth daily.    [provider]  LORazepam (ATIVAN) 0.5 MG tablet Take 1 tablet (0.5 mg total) by mouth daily before breakfast. 03/24/21   Samella Parr, NP  metFORMIN (GLUCOPHAGE) 500 MG tablet Take 500 mg by mouth 2 (two) times daily with a meal.    [provider]  metoprolol succinate (TOPROL-XL) 25 MG 24 hr tablet Take 0.5 tablets (12.5 mg total) by mouth daily. 03/23/21   Samella Parr, NP  Multiple Vitamin (MULTIVITAMIN WITH MINERALS) TABS tablet Take 1 tablet by mouth daily. 03/23/21   Samella Parr, NP  polyethylene glycol (MIRALAX / GLYCOLAX) 17 g packet Take 17 g by mouth 2 (two) times daily. 03/23/21   Samella Parr, NP  risperiDONE (RISPERDAL) 2 MG tablet Take 1 tablet (2 mg total) by mouth at bedtime. 11/03/20 01/24/21  Donne Hazel, MD  senna-docusate (SENOKOT-S) 8.6-50 MG tablet Take 1 tablet by mouth 2 (two) times daily. 60 03/23/21   Samella Parr, NP  traZODone (DESYREL) 100 MG tablet Take 1 tablet (100 mg total) by mouth at bedtime as needed for sleep. 11/03/20 01/24/21  Donne Hazel, MD  valACYclovir (VALTREX) 500 MG tablet Take 500 mg by mouth at bedtime.    [provider]      Allergies    Penicillins, Sulfa antibiotics, and Tomato    Review of Systems  Review of Systems  Physical Exam Updated Vital Signs BP 128/74 (BP Location: Right Arm)   Pulse 92   Temp 98 F (36.7 C) (Oral)   Resp 18   Ht '5\' 1"'$  (1.549 m)   Wt 68 kg   SpO2 100%   BMI 28.34 kg/m  Physical Exam Vitals and nursing note reviewed.  Constitutional:      General: She is not in acute distress.    Appearance: She is well-developed.  HENT:     Head: Normocephalic and atraumatic.  Eyes:     Conjunctiva/sclera: Conjunctivae normal.  Cardiovascular:     Rate and Rhythm: Normal rate and regular rhythm.     Heart sounds: No murmur heard. Pulmonary:     Effort: Pulmonary effort is normal. No respiratory  distress.     Breath sounds: Normal breath sounds.  Abdominal:     Palpations: Abdomen is soft.     Tenderness: There is no abdominal tenderness.  Musculoskeletal:        General: No swelling.     Cervical back: Neck supple.  Skin:    General: Skin is warm and dry.     Capillary Refill: Capillary refill takes less than 2 seconds.  Neurological:     Mental Status: She is alert.  Psychiatric:        Mood and Affect: Affect is flat.        Speech: She is noncommunicative.        Behavior: Behavior is slowed.     ED Results / Procedures / Treatments   Labs (all labs ordered are listed, but only abnormal results are displayed) Labs Reviewed  ETHANOL  PROTIME-INR  APTT  CBC  DIFFERENTIAL  COMPREHENSIVE METABOLIC PANEL  RAPID URINE DRUG SCREEN, HOSP PERFORMED  URINALYSIS, ROUTINE W REFLEX MICROSCOPIC  I-STAT CHEM 8, ED  CBG MONITORING, ED    EKG None  Radiology No results found.  Procedures Procedures    Medications Ordered in ED Medications  LORazepam (ATIVAN) injection 1 mg (has no administration in time range)  sodium chloride 0.9 % bolus 1,000 mL (has no administration in time range)    ED Course/ Medical Decision Making/ A&P Clinical Course as of 08/20/22 2159  Nancy Fetter Aug 20, 2022  2157 Patient presents with decreased activity since around 5 PM yesterday.  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  On exam patient has very flat affect, does not respond to questions and is stiff, and when I move her extremities they state the position I posed them in.  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  Ativan ordered. Pt also seen by Dr. Darl Householder [CB]    Clinical Course User Index [CB] Gwenevere Abbot, PA-C                             Medical Decision Making This patient  presents to the ED for concern of change in mental status, this involves an extensive number of treatment options, and is a complaint that carries with it a high risk of complications and morbidity.  The differential diagnosis includes headache, stroke, metabolic encephalopathy,   Co morbidities that complicate the patient evaluation  History of seizures hypoglycemia   Additional history obtained:  Additional history obtained from EMR External records  from outside source obtained and reviewed including prior admission for catatonia   Lab Tests:  I Ordered, and personally interpreted labs.  The pertinent results include: Mild hyperglycemia    Cardiac Monitoring: / EKG:  The patient was maintained on a cardiac monitor.  I personally viewed and interpreted the cardiac monitored which showed an underlying rhythm of: Sinus rhythm   Consultations Obtained:  I requested consultation with the Dr. Lorrin Goodell,  and discussed lab and imaging findings as well as pertinent plan - they recommend: Patient's sister says she had an episode of shaking lasting 1 to 2 minutes before calling now 1 today so recommended observe to see if patient improves as this could be postictal state and increase Keppra to 1250 twice daily.  Low suspicion for stroke or TIA, can be ruled out if MRI is normal   Problem List / ED Course / Critical interventions / Medication management  Change in mental status I ordered medication including ativan  for possible catatonia  Reevaluation of the patient after these medicines showed that the patient improved I have reviewed the patients home medicines and have made adjustments as needed  Signed out to oncoming team ending remainder of labs and CT    Amount and/or Complexity of Data Reviewed Labs: ordered. Radiology: ordered.  Risk Prescription drug management.           Final Clinical Impression(s) / ED Diagnoses Final diagnoses:  None    Rx / DC  Orders ED Discharge Orders     None         [place], [place] 08/20/22 2345    Drenda Freeze, MD 08/23/22 1311

## 2022-08-20 NOTE — ED Triage Notes (Signed)
Pt [facility] family stated to EMS that pt went to the bathroom and when she came out she was drooling and they thought stroke. Pt hx tia w/ cognitive disabilities. Ambulatory.

## 2022-08-20 NOTE — ED Provider Notes (Signed)
57 year old female with concern for drooling, change in baseline per sister caretaker. Hx of seizures, had seizure tonight.  Call to neuro- MRI, CTA pending If improving- might be postictal vs metabolic vs catatonic (hx of same) Increase keppra to 1250mg  BID Physical Exam  BP 112/62   Pulse 75   Temp 97.9 F (36.6 C) (Oral)   Resp 12   Ht 5\' 1"  (1.549 m)   Wt 68 kg   SpO2 99%   BMI 28.34 kg/m   Physical Exam  Procedures  Procedures  ED Course / MDM   Clinical Course as of 08/21/22 0516  Wynelle Link Aug 20, 2022  2157 Patient presents with decreased activity since around 5 PM yesterday.  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  On exam patient has very flat affect, does not respond to questions and is stiff, and when I move her extremities they state the position I posed them in.  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  Ativan ordered. Pt also seen by Dr. Silverio Lay [CB]    Clinical Course User Index [CB] Ma Rings, PA-C   Medical Decision Making Amount and/or Complexity of Data Reviewed Labs: ordered. Radiology: ordered.  Risk Prescription drug management.   Patient able to state name, came to the hospital on an ambulance. Lives with her sister, [name]. Ate fish sticks and broccoli for lunch today.  Sister reports presentation similar to her last admission for catatonia when patient wouldn't eat or drink.   CT and MRI without acute findings.  On recheck, patient arouses to verbal stimuli, except offered water to drink, is able to hold the cup and drink an entire cup of water without assistance.  Plan is to increase Keppra as previously recommended with neurohospitalist with PCP recheck. Report of seizure prior to presentation, provided with Ativan in the ER with improvement in mental  status.       Jeannie Fend, PA-C 08/21/22 1610    Nicanor Alcon, April, MD 08/21/22 [phone]

## 2022-08-21 ENCOUNTER — Emergency Department (HOSPITAL_COMMUNITY): Payer: Medicaid Other

## 2022-08-21 LAB — COMPREHENSIVE METABOLIC PANEL
ALT: 46 U/L — ABNORMAL HIGH (ref 0–44)
AST: 45 U/L — ABNORMAL HIGH (ref 15–41)
Albumin: 3.1 g/dL — ABNORMAL LOW (ref 3.5–5.0)
Albumin: 3.2 g/dL — ABNORMAL LOW (ref 3.5–5.0)
Alkaline Phosphatase: 73 U/L (ref 38–126)
Alkaline Phosphatase: 78 U/L (ref 38–126)
Anion gap: 5 (ref 5–15)
Anion gap: 6 (ref 5–15)
BUN: 11 mg/dL (ref 6–20)
BUN: 12 mg/dL (ref 6–20)
CO2: 23 mmol/L (ref 22–32)
CO2: 26 mmol/L (ref 22–32)
Calcium: 8.1 mg/dL — ABNORMAL LOW (ref 8.9–10.3)
Calcium: 8.6 mg/dL — ABNORMAL LOW (ref 8.9–10.3)
Chloride: 100 mmol/L (ref 98–111)
Chloride: 102 mmol/L (ref 98–111)
Creatinine, Ser: 0.7 mg/dL (ref 0.44–1.00)
Creatinine, Ser: 0.78 mg/dL (ref 0.44–1.00)
GFR, Estimated: >60 mL/min (ref >60)
GFR, Estimated: >60 mL/min (ref >60)
Glucose, Bld: 233 mg/dL — ABNORMAL HIGH (ref 70–99)
Glucose, Bld: 248 mg/dL — ABNORMAL HIGH (ref 70–99)
Potassium: 4.6 mmol/L (ref 3.5–5.1)
Sodium: 128 mmol/L — ABNORMAL LOW (ref 135–145)
Sodium: 134 mmol/L — ABNORMAL LOW (ref 135–145)
Total Bilirubin: 0.6 mg/dL (ref 0.3–1.2)
Total Bilirubin: 0.7 mg/dL (ref 0.3–1.2)
Total Protein: 6.6 g/dL (ref 6.5–8.1)

## 2022-08-21 LAB — RAPID URINE DRUG SCREEN, HOSP PERFORMED
Amphetamines: NOT DETECTED
Barbiturates: NOT DETECTED
Benzodiazepines: NOT DETECTED
Cocaine: NOT DETECTED
Opiates: NOT DETECTED
Tetrahydrocannabinol: NOT DETECTED

## 2022-08-21 MED ORDER — LEVETIRACETAM 250 MG PO TABS: 250.0000 mg | ORAL_TABLET | Freq: Two times a day (BID) | ORAL | 0 refills | Status: DC

## 2022-08-21 MED ORDER — IOHEXOL 350 MG/ML SOLN
75.0000 mL | Freq: Once | INTRAVENOUS | Status: AC | PRN
  Administered 2022-08-21: 75 mL via INTRAVENOUS

## 2022-08-21 NOTE — Discharge Instructions (Addendum)
Please increase Keppra to 1,'250mg'$  twice daily. An additional prescription for '250mg'$  tablets has been sent to your pharmacy. Recheck with your doctor.

## 2022-08-22 ENCOUNTER — Other Ambulatory Visit: Payer: Self-pay

## 2022-08-22 ENCOUNTER — Emergency Department (HOSPITAL_COMMUNITY)
Admission: EM | Admit: 2022-08-22 | Discharge: 2022-08-22 | Disposition: A | Discharge status: Home / Self Care | Payer: Medicaid Other | Attending: Emergency Medicine | Admitting: Emergency Medicine

## 2022-08-22 ENCOUNTER — Encounter (HOSPITAL_COMMUNITY): Payer: Self-pay

## 2022-08-22 DIAGNOSIS — I10 Essential (primary) hypertension: Secondary | ICD-10-CM | POA: Diagnosis not present

## 2022-08-22 DIAGNOSIS — Z794 Long term (current) use of insulin: Secondary | ICD-10-CM | POA: Diagnosis not present

## 2022-08-22 DIAGNOSIS — E119 Type 2 diabetes mellitus without complications: Secondary | ICD-10-CM | POA: Insufficient documentation

## 2022-08-22 DIAGNOSIS — Z7984 Long term (current) use of oral hypoglycemic drugs: Secondary | ICD-10-CM | POA: Insufficient documentation

## 2022-08-22 DIAGNOSIS — Z79899 Other long term (current) drug therapy: Secondary | ICD-10-CM | POA: Insufficient documentation

## 2022-08-22 DIAGNOSIS — R569 Unspecified convulsions: Secondary | ICD-10-CM

## 2022-08-22 LAB — CBC
HCT: 39.6 % (ref 36.0–46.0)
Hemoglobin: 12.5 g/dL (ref 12.0–15.0)
MCH: 30.3 pg (ref 26.0–34.0)
MCHC: 31.6 g/dL (ref 30.0–36.0)
MCV: 96.1 fL (ref 80.0–100.0)
Platelets: 289 10*3/uL (ref 150–400)
RBC: 4.12 MIL/uL (ref 3.87–5.11)
RDW: 13 % (ref 11.5–15.5)
WBC: 9.2 10*3/uL (ref 4.0–10.5)
nRBC: 0 % (ref 0.0–0.2)

## 2022-08-22 LAB — BASIC METABOLIC PANEL
Anion gap: 6 (ref 5–15)
BUN: 10 mg/dL (ref 6–20)
CO2: 25 mmol/L (ref 22–32)
Calcium: 8.8 mg/dL — ABNORMAL LOW (ref 8.9–10.3)
Chloride: 103 mmol/L (ref 98–111)
Creatinine, Ser: 0.87 mg/dL (ref 0.44–1.00)
GFR, Estimated: >60 mL/min (ref >60)
Glucose, Bld: 341 mg/dL — ABNORMAL HIGH (ref 70–99)
Potassium: 4.4 mmol/L (ref 3.5–5.1)
Sodium: 134 mmol/L — ABNORMAL LOW (ref 135–145)

## 2022-08-22 LAB — HCG, QUANTITATIVE, PREGNANCY: hCG, Beta Chain, Quant, S: 1 m[IU]/mL (ref <5)

## 2022-08-22 LAB — CBG MONITORING, ED
Glucose-Capillary: 306 mg/dL — ABNORMAL HIGH (ref 70–99)
Glucose-Capillary: 201 mg/dL — ABNORMAL HIGH (ref 70–99)

## 2022-08-22 LAB — LEVETIRACETAM LEVEL: Levetiracetam Lvl: <2 ug/mL — ABNORMAL LOW (ref 10.0–40.0)

## 2022-08-22 MED ORDER — LORAZEPAM 1 MG PO TABS
1.0000 mg | ORAL_TABLET | Freq: Once | ORAL | Status: AC
  Administered 2022-08-22: 1 mg via ORAL
  Filled 2022-08-22: qty 1

## 2022-08-22 MED ORDER — LEVETIRACETAM IN NACL 1500 MG/100ML IV SOLN
1500.0000 mg | Freq: Once | INTRAVENOUS | Status: AC
  Administered 2022-08-22: 1500 mg via INTRAVENOUS
  Filled 2022-08-22: qty 100

## 2022-08-22 NOTE — ED Notes (Addendum)
Pt discharge instructions reviewed with pt and family, importance of admin of seizure medication reiterated to pt and family. Pt transported from ED via wheelchair

## 2022-08-22 NOTE — ED Triage Notes (Signed)
Coming from home. Ran out of keppra a few days ago. Today had two witnessed grand mal seizures by her sister. HX TIA and MR. Currently postictal, drowsy but arousable.

## 2022-08-22 NOTE — ED Notes (Signed)
ED Provider at bedside. 

## 2022-08-22 NOTE — ED Provider Notes (Signed)
[facility] Provider Note   CSN: [id number] Arrival date & time: 08/22/22  1437     History Chief Complaint  Patient presents with   Seizures    [name] is a 57 y.o. female diabetes, hypertension, MR, bipolar, seizures presents the emergency room today for evaluation of 2 seizures today.  She is accompanied by her sister who provides the majority of the history.  Her sister reports that she was laying in bed when she had a tonic-clonic seizure that lasted less than 20 seconds.  She reports that she called EMS and they came to evaluate her.  The EMS crew brought to the sister's attention that and her medications, Keppra is not one of them.  During this time, [facility] brought in the [place] that was ordered while she was last in the ER of the 250 mg.  Her sister gave her 1 of these.  She had another seizure this time this is reports that she had some facial twitching on the right but again lasted less than 20 seconds.  History reports that she is not at her baseline and is "out of it".  No vomiting or falls.  Patient is awake and does not appear in any acute distress.  Alert, not somnolent.  Sister reports that they have a appointment with the PCP tomorrow.  She thinks that she may not of had her Keppra for the past month because in her medication binder there is only been 3 pills instead of 4 pills, but she is not sure.   Seizures      Home Medications Prior to Admission medications   Medication Sig Start Date End Date Taking? Authorizing Provider  acetaminophen (TYLENOL) 325 MG tablet Take 2 tablets (650 mg total) by mouth every 6 (six) hours as needed for mild pain (or Fever >/= 101). 03/23/21   Samella Parr, NP  glimepiride (AMARYL) 2 MG tablet Take 2 mg by mouth daily before breakfast.    [provider]  insulin aspart (NOVOLOG) 100 UNIT/ML injection Inject 1-7 Units into the skin 3 (three) times daily as needed  for high blood sugar (Per sliding scale: 120-150=1 unit, 150-200=2 units, 201-250=4 units, 251-300=6 units).    [provider]  insulin aspart (NOVOLOG) 100 UNIT/ML injection Inject 8 Units into the skin 3 (three) times daily with meals. 03/23/21   Samella Parr, NP  insulin glargine (LANTUS) 100 unit/mL SOPN Inject 30 Units into the skin at bedtime. 03/23/21   Samella Parr, NP  levETIRAcetam (KEPPRA) 1000 MG tablet Take 1 tablet (1,000 mg total) by mouth 2 (two) times daily. 11/03/20 12/03/20  Donne Hazel, MD  levETIRAcetam (KEPPRA) 250 MG tablet Take 1 tablet (250 mg total) by mouth 2 (two) times daily. 08/21/22 09/20/22  Tacy Learn, PA-C  loratadine (CLARITIN) 10 MG tablet Take 10 mg by mouth daily.    [provider]  LORazepam (ATIVAN) 0.5 MG tablet Take 1 tablet (0.5 mg total) by mouth daily before breakfast. 03/24/21   Samella Parr, NP  metFORMIN (GLUCOPHAGE) 500 MG tablet Take 500 mg by mouth 2 (two) times daily with a meal.    [provider]  metoprolol succinate (TOPROL-XL) 25 MG 24 hr tablet Take 0.5 tablets (12.5 mg total) by mouth daily. 03/23/21   Samella Parr, NP  Multiple Vitamin (MULTIVITAMIN WITH MINERALS) TABS tablet Take 1 tablet by mouth daily. 03/23/21   Erin Hearing  L, NP  polyethylene glycol (MIRALAX / GLYCOLAX) 17 g packet Take 17 g by mouth 2 (two) times daily. 03/23/21   Samella Parr, NP  risperiDONE (RISPERDAL) 2 MG tablet Take 1 tablet (2 mg total) by mouth at bedtime. 11/03/20 01/24/21  Donne Hazel, MD  senna-docusate (SENOKOT-S) 8.6-50 MG tablet Take 1 tablet by mouth 2 (two) times daily. 60 03/23/21   Samella Parr, NP  traZODone (DESYREL) 100 MG tablet Take 1 tablet (100 mg total) by mouth at bedtime as needed for sleep. 11/03/20 01/24/21  Donne Hazel, MD  valACYclovir (VALTREX) 500 MG tablet Take 500 mg by mouth at bedtime.    [provider]      Allergies    Penicillins, Sulfa antibiotics, and Tomato     Review of Systems   Review of Systems  Unable to perform ROS: Other (MR)  Neurological:  Positive for seizures.    Physical Exam Updated Vital Signs BP 109/77   Pulse 93   Temp 98.3 F (36.8 C)   Resp 10   Ht '5\' 1"'$  (1.549 m)   Wt 68 kg   SpO2 100%   BMI 28.33 kg/m  Physical Exam Vitals and nursing note reviewed.  Constitutional:      General: She is not in acute distress.    Appearance: She is not ill-appearing or toxic-appearing.  Eyes:     Pupils: Pupils are equal, round, and reactive to light.  Cardiovascular:     Rate and Rhythm: Normal rate.  Pulmonary:     Effort: Pulmonary effort is normal. No respiratory distress.  Abdominal:     Palpations: Abdomen is soft.     Tenderness: There is no abdominal tenderness. There is no guarding or rebound.  Musculoskeletal:     Cervical back: Normal range of motion. No rigidity.     Right lower leg: No edema.     Left lower leg: No edema.     Comments: Does not elicit a painful response with palpation of the bilateral upper and lower extremities.  Compartments are soft.  Skin:    General: Skin is warm and dry.  Neurological:     Mental Status: She is alert.     Comments: Cranial nerve assessment limited given that patient is difficult with following simple commands at this time.  Her pupils are equal round and reactive to light.  She is able to maintain eye contact.  She is moving her upper and lower extremities with verbal command.  She reports that sensations intact throughout.  She is aware to the person on the phone as well as her sister in the room.  Psychiatric:        Mood and Affect: Affect is flat.        Behavior: Behavior is slowed.     ED Results / Procedures / Treatments   Labs (all labs ordered are listed, but only abnormal results are displayed) Labs Reviewed  BASIC METABOLIC PANEL - Abnormal; Notable for the following components:      Result Value   Sodium 134 (*)    Glucose, Bld 341 (*)    Calcium  8.8 (*)    All other components within normal limits  CBG MONITORING, ED - Abnormal; Notable for the following components:   Glucose-Capillary 306 (*)    All other components within normal limits  CBG MONITORING, ED - Abnormal; Notable for the following components:   Glucose-Capillary 201 (*)    All other  components within normal limits  CBC  HCG, QUANTITATIVE, PREGNANCY  LEVETIRACETAM LEVEL    EKG None  Radiology MR BRAIN WO CONTRAST  Result Date: 08/21/2022 CLINICAL DATA:  TIA.  Drooling with trouble speaking. EXAM: MRI HEAD WITHOUT CONTRAST TECHNIQUE: Multiplanar, multiecho pulse sequences of the brain and surrounding structures were obtained without intravenous contrast. COMPARISON:  Head CT and CTA from earlier today. FINDINGS: Brain: No acute infarction, hemorrhage, hydrocephalus, extra-axial collection or mass lesion. Vascular: Normal flow voids. Skull and upper cervical spine: Normal marrow signal. Sinuses/Orbits: Essentially negative Other: Progressive and at times moderate motion artifact. IMPRESSION: Normal motion degraded brain MRI. Electronically Signed   By: Jorje Guild M.D.   On: 08/21/2022 04:53   DG Abdomen 1 View  Result Date: 08/21/2022 CLINICAL DATA:  Evaluate for retained foreign body EXAM: ABDOMEN - 1 VIEW COMPARISON:  01/31/2021 FINDINGS: Contrast material is noted within the collecting systems and bladder consistent with the recent CT. No radiopaque foreign body is noted. No acute bony abnormality is seen. No obstructive changes are noted. IMPRESSION: No radiopaque foreign body is noted. Electronically Signed   By: Inez Catalina M.D.   On: 08/21/2022 03:18   CT ANGIO HEAD NECK W WO CM  Result Date: 08/21/2022 CLINICAL DATA:  Initial evaluation for acute TIA for trauma trouble speaking. EXAM: CT ANGIOGRAPHY HEAD AND NECK TECHNIQUE: Multidetector CT imaging of the head and neck was performed using the standard protocol during bolus administration of intravenous  contrast. Multiplanar CT image reconstructions and MIPs were obtained to evaluate the vascular anatomy. Carotid stenosis measurements (when applicable) are obtained utilizing NASCET criteria, using the distal internal carotid diameter as the denominator. RADIATION DOSE REDUCTION: This exam was performed according to the departmental dose-optimization program which includes automated exposure control, adjustment of the mA and/or kV according to patient size and/or use of iterative reconstruction technique. CONTRAST:  55m OMNIPAQUE IOHEXOL 350 MG/ML SOLN COMPARISON:  None Available. FINDINGS: CT HEAD FINDINGS Brain: Cerebral volume within normal limits for patient age. No evidence for acute intracranial hemorrhage. No findings to suggest acute large vessel territory infarct. No mass lesion, midline shift, or mass effect. Ventricles are normal in size without evidence for hydrocephalus. No extra-axial fluid collection identified. Vascular: No hyperdense vessel identified. Skull: Scalp soft tissues demonstrate no acute abnormality. Calvarium intact. Sinuses/Orbits: Globes and orbital soft tissues within normal limits. Right maxillary sinus retention cyst. Paranasal sinuses are otherwise largely clear. No mastoid effusion. CTA NECK FINDINGS Aortic arch: Visualized aortic arch within normal limits for caliber with standard 3 vessel morphology. No stenosis or other abnormality about the origin the great vessels. Right carotid system: Right common and internal carotid arteries are patent without dissection. Short-segment stenosis of 35% seen at the origin of the cervical right ICA. Left carotid system: Left common and internal carotid arteries are patent without stenosis, dissection or occlusion. Vertebral arteries: Both vertebral arteries arise from subclavian arteries. No proximal subclavian artery stenosis. Right vertebral artery dominant. Vertebral arteries patent without stenosis or dissection. Skeleton: No discrete  or worrisome osseous lesions. Torus mandibularis noted. Other neck: No other acute soft tissue abnormality within the neck. Upper chest: Visualized upper chest demonstrates no acute finding. Review of the MIP images confirms the above findings CTA HEAD FINDINGS Anterior circulation: Both internal carotid arteries widely patent to the termini without stenosis. A1 segments widely patent. Normal anterior communicating artery complex. Both anterior cerebral arteries widely patent to their distal aspects without stenosis. No M1 stenosis or occlusion. Normal  MCA bifurcations. Distal MCA branches well perfused and symmetric. Posterior circulation: Both vertebral arteries patent without stenosis. Right vertebral artery dominant. Both PICA patent. Basilar diminutive but patent without stenosis. Superior cerebral arteries patent bilaterally. Both PCA supplied via the basilar as well as small bilateral posterior communicating arteries. Both PCAs patent to their distal aspects without stenosis. Venous sinuses: Patent allowing for timing the contrast bolus. Anatomic variants: As above.  No aneurysm. Review of the MIP images confirms the above findings IMPRESSION: 1. Negative CTA of the head and neck. No large vessel occlusion or other emergent finding. 2. 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 Otherwise wide patency of the major arterial vasculature of the head and neck. 3. No other acute intracranial abnormality. Electronically Signed   By: Jeannine Boga M.D.   On: 08/21/2022 01:29    Procedures Procedures   Medications Ordered in ED Medications  levETIRAcetam (KEPPRA) IVPB 1500 mg/ 100 mL premix (0 mg Intravenous Stopped 08/22/22 1614)  LORazepam (ATIVAN) tablet 1 mg (1 mg Oral Given 08/22/22 1835)    ED Course/ Medical Decision Making/ A&P Clinical Course as of 08/22/22 2325  Tue Aug 22, 2022  1532 On the phone with Rober Minion Keppra '1000mg'$  BID today but was brought the Keppra '250mg'$ . Before  that it was last year March.  Delma Freeze is the prescribing provider.  [RR]  1758 On reevaluation, patient is interactive moving all extremities.  She is smiling in no acute distress. [RR]    Clinical Course User Index [RR] Sherrell Puller, PA-C                           Medical Decision Making Amount and/or Complexity of Data Reviewed Labs: ordered.  Risk Prescription drug management.   57 y.o. female presents to the ER for evaluation of seizures. Differential diagnosis includes but is not limited to seizure disorder versus electrolyte normality versus subtherapeutic dosing. Vital signs unremarkable. Physical exam as noted above.   Patient recently had CT imaging and MRI imaging that was grossly unremarkable other than 35% stenosis at the origin of the cervical right ICA.  At this time, I do not think any additional imaging needs to be performed as patient just had imaging within the past 24 to 48 hours.  No injury obtained.  Patient declines any pain.  I independently reviewed and interpreted the patient's labs.  hCG negative.  CBC without cytosis or anemia.  BMP shows slight decrease in sodium 134 though likely pseudohyponatremia given glucose is 341.  Calcium at 8.8.  Keppra level pending.  I was able to get a hold of [facility]  I spoke to the pharmacy tech there.  She told me that there was a prescription delivered today for the Keppra of 250 mg and there should be a delivery for Keppra 1000 mg twice daily that was filled today but should be delivered tomorrow.  I asked when the last time the Keppra 250 was filled but that was a new prescription.  She reports the last time the Keppra 1000 mg twice daily was filled was of March 2023.  She reports that Iona Beard is the prescribing provider.  I discussed this with the sister at bedside.  She reports that when EMS told her that there was no Keppra in her meds, she called the primary care doctor for a refill.  The sister denies  that they get their medications from any  other pharmacy.  Is likely the patient has not had Keppra in quite a while.  Could be the reason why she is having these breakthrough seizures.  I have placed her on a Keppra load of 1500 mg.  On reevaluation, patient is responding quicker to questions.  She is more interactive and smiley.  When handing her drink, she does not reach out for it though.  Stills seems slightly guarded.  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  After the Ativan, sister is now at bedside.  I discussed the lab results with them and discussed the information I heard from [facility].  Patient is more interactive with me and to members at bedside now.  She was able to take the cup from my hand and move it to her mouth to take a drink as well as feed herself a sandwich.  Sister reports that she is doing much better.  Given that she is steadily increasing, I do think she is safe for discharge home.  Seizures are likely due to her not having Keppra in a while.  She already had imaging done and, I do not see any need for any repeat imaging. They have a primary care doctor appointment tomorrow.  Sister does not know if she has a neurologist.  I did include the information for a neurologist into the discharge paperwork I do think is important for the patient to follow-up given her seizure history.  I discussed with the sister to make sure that they go over the medications tomorrow at the appointment to make sure her medications are being well-managed.  In the meantime, we discussed return precautions red flag symptoms.  Guardian verbalized understanding agrees the plan.  Patient is stable being discharged home in good condition.  Portions of this report may have been transcribed using voice recognition software. Every effort was made to ensure accuracy; however, inadvertent computerized transcription errors may be present.    Final Clinical Impression(s) / ED Diagnoses Final diagnoses:  Seizure [facility])    Rx / [place] Orders ED Discharge Orders     None         Sherrell Puller, PA-C 08/22/22 [place], DO 08/22/22 2355

## 2022-08-22 NOTE — Discharge Instructions (Signed)
Your loved one was seen in the ER today for evaluation after seizures.  I likely think the seizure happened as she has not been on her medication for a long time.  We were able to give her some Keppra which is her seizure medication through her IV today.  It seems that she is doing much better.  I recommend that you follow-up with your primary care doctor at your scheduled appointment tomorrow to go over your medications.  I have included a neurologist and a decision for work for you to call to follow-up with if you do not already have 1.  If you have any concerns, new or worsening symptoms, please return to the nearest emergency room for evaluation.  Contact a doctor if: You have another seizure or seizures. Call the doctor each time you have a seizure. The pattern of your seizures changes. You keep having seizures with treatment. You have symptoms of being sick or having an infection. You are not able to take your medicine. Get help right away if: You have any of these problems: A seizure that lasts longer than 5 minutes. Many seizures in a row and you do not feel better between seizures. A seizure that makes it harder to breathe. A seizure and you can no longer speak or use part of your body. You do not wake up right after a seizure. You get hurt during a seizure. You feel confused or have pain right after a seizure. These symptoms may be an emergency. Get help right away. Call your local emergency services (911 in the [place] Do not wait to see if the symptoms will go away. Do not drive yourself to the hospital.

## 2022-08-23 LAB — LEVETIRACETAM LEVEL: Levetiracetam Lvl: 3.2 ug/mL — ABNORMAL LOW (ref 10.0–40.0)

## 2022-08-28 ENCOUNTER — Encounter: Payer: Self-pay | Admitting: Neurology

## 2022-08-29 ENCOUNTER — Ambulatory Visit: Payer: Medicaid Other | Admitting: Neurology

## 2022-08-29 ENCOUNTER — Ambulatory Visit (INDEPENDENT_AMBULATORY_CARE_PROVIDER_SITE_OTHER): Payer: Medicaid Other | Admitting: Neurology

## 2022-08-29 ENCOUNTER — Encounter: Payer: Self-pay | Admitting: Neurology

## 2022-08-29 VITALS — BP 112/76 | HR 95 | Ht 62.0 in | Wt 182.4 lb

## 2022-08-29 DIAGNOSIS — F061 Catatonic disorder due to known physiological condition: Secondary | ICD-10-CM | POA: Diagnosis not present

## 2022-08-29 DIAGNOSIS — R569 Unspecified convulsions: Secondary | ICD-10-CM

## 2022-08-29 DIAGNOSIS — R4701 Aphasia: Secondary | ICD-10-CM

## 2022-08-29 NOTE — Progress Notes (Signed)
NEUROLOGY CONSULTATION NOTE  [name] MRN: [id number] DOB: Mar 18, 1966  Referring provider: Dr. Deno Etienne (ER) Primary care provider: Dr. Iona Beard  Reason for consult:  seizure  Dear Dr Tyrone Nine:  Thank you for your kind referral of [name] for consultation of the above symptoms. Although her history is well known to you, please allow me to reiterate it for the purpose of our medical record. The patient was accompanied to the clinic by her sister [name] who also provides collateral information. Records and images were personally reviewed where available.   HISTORY OF PRESENT ILLNESS: This is a 57 year old right-handed woman with a history of mild intellectual disability, hypertension, diabetes, presenting for evaluation of seizures. Her sister [name] provides the history as patient is non-verbal in the office today. Records were also reviewed and will be summarized as follows. She had been living in a group home for 12 years up until 2021 when the group home closed and [name] took her in. She was in the hospital in May 2022 for seizures. At that time, [name] was unaware of a history of seizures (but states that she found out later on from another group home resident that she used to have seizures). She had been on Depakote for many years due to behavioral issues. She had stopped this along with several other medications in August 2021. Her sister started noticing intermittent staring/behavioral arrest episodes lasting a few seconds to 2 minutes. Per notes, "over the past day, she has not had much speech output at all." She had intermittent twitching of the right side of her face, then had a focal seizure witnessed by the ER physician. Overnight EEG in 10/2020 was normal. MRI brain no acute changes. She was discharged on Levetiracetam 1000mg  BID. She was in the ER in August 2022 for decreased oral intake and "saying bizarre things." She was not on Levetiracetam on admission. MRI brain and  EEG were normal. She was admitted for 2 months. She was seen by Psychiatry and diagnosed with catatonia, treated with Ativan with significant improvement in symptoms. After it was tapered and discontinued, catatonia recurred and once again improved after she was placed on low dose schedule lorazepam 0.5mg  daily. Levetiracetam 1000mg  BID restarted. [name] reports that she did well for 2 years, at baseline she is verbal, eats three meals a day, makes her lunch, brushes her teeth and showers, reads. She is able to get her coat and pocketbook when [facility] comes to bring her to her day program at [facility]. On 08/20/22, she had a change in mental status, drooling, very flat affect, unresponsive to questions. She was noted to be stiff, and in the ER she had waxy flexibility (when extremities moved, they stayed in the position she was posed in). MRI brain no acute changes. She was given Ativan and Levetiracetam increased to 1250mg  BID. She was back in the ER on 08/22/22 after a witnessed tonic-clonic seizure lasting 20 seconds at home. When EMS evaluated her medications, Levetiracetam was not one of them, but that sister gave her 250mg  from recent ER visit. Last time 1000mg  tablet was filled was in March 2023. She had another seizure with right-sided facial twitching lasting less than 20 seconds. On ER discharge, she was responding quicker to questions, more interactive and smiling, feeding herself. [name] reports she was still non-verbal and has remained non-verbal since. She is eating and feeds herself but holds food in the mouth the past 2 nights. She used to  put her own medications in a cup but now [name] has to use a straw and mixes her medications in pudding. Sleep is good. She can follow instructions and laughs at videos but otherwise has no other verbal output.   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  She does jobs with her program. [name] denies any history of febrile seizures, CNS infections, neurosurgical procedures, family history of seizures.    PAST MEDICAL HISTORY: Past Medical History:  Diagnosis Date   Diabetes mellitus without complication ([name])    Hypertension    Immune deficiency disorder ([place])    Mild mental retardation    Seizure ([name])     PAST SURGICAL HISTORY: Past Surgical History:  Procedure Laterality Date   IR GASTROSTOMY TUBE MOD SED  02/14/2021   IR GASTROSTOMY TUBE REMOVAL  03/28/2021    MEDICATIONS: Current Outpatient Medications on File Prior to Visit  Medication Sig Dispense Refill   acetaminophen (TYLENOL) 325 MG tablet Take 2 tablets (650 mg total) by mouth every 6 (six) hours as needed for mild pain (or Fever >/= 101).     glimepiride (AMARYL) 2 MG tablet Take 2 mg by mouth daily before breakfast.     insulin aspart (NOVOLOG) 100 UNIT/ML injection Inject 1-7 Units into the skin 3 (three) times daily as needed for high blood sugar (Per sliding scale: 120-150=1 unit, 150-200=2 units, 201-250=4 units, 251-300=6 units).     insulin aspart (NOVOLOG) 100 UNIT/ML injection Inject 8 Units into the skin 3 (three) times daily with meals. (Patient taking differently: Inject 14 Units into the skin 3 (three) times daily with meals.) 10 mL 11   insulin glargine (LANTUS) 100 unit/mL SOPN Inject 30 Units into the skin at bedtime. (Patient taking differently: Inject 38 Units into the skin at bedtime.) 15 mL 11   levETIRAcetam (KEPPRA) 1000 MG tablet Take 1,000 mg by mouth 2 (two) times daily.     loratadine (CLARITIN) 10 MG tablet Take 10 mg by mouth daily.     LORazepam (ATIVAN) 0.5 MG tablet Take 1 tablet (0.5 mg total) by mouth daily before breakfast. 30 tablet 0   metFORMIN (GLUCOPHAGE) 500 MG tablet Take 500 mg by mouth 2 (two) times daily with a meal.     metoprolol succinate (TOPROL-XL) 25 MG 24 hr tablet Take 0.5 tablets (12.5 mg total) by mouth daily. 30 tablet 2    Multiple Vitamin (MULTIVITAMIN WITH MINERALS) TABS tablet Take 1 tablet by mouth daily.     polyethylene glycol (MIRALAX / GLYCOLAX) 17 g packet Take 17 g by mouth 2 (two) times daily. 14 each 0   risperiDONE (RISPERDAL) 2 MG tablet Take 1 tablet (2 mg total) by mouth at bedtime. 30 tablet 0   senna-docusate (SENOKOT-S) 8.6-50 MG tablet Take 1 tablet by mouth 2 (two) times daily. 60 60 tablet 6   traZODone (DESYREL) 100 MG tablet Take 1 tablet (100 mg total) by mouth at bedtime as needed for sleep. 30 tablet 0   valACYclovir (VALTREX) 500 MG tablet Take 500 mg by mouth at bedtime.     No current facility-administered medications on file prior to visit.    ALLERGIES: Allergies  Allergen Reactions   Penicillins    Sulfa Antibiotics    Tomato     FAMILY HISTORY: Family History  Problem Relation Age of Onset   Hypertension Other  SOCIAL HISTORY: Social History   Socioeconomic History   Marital status: Single    Spouse name: Not on file   Number of children: Not on file   Years of education: Not on file   Highest education level: Not on file  Occupational History   Not on file  Tobacco Use   Smoking status: Never   Smokeless tobacco: Never  Vaping Use   Vaping Use: Never used  Substance and Sexual Activity   Alcohol use: No   Drug use: Never   Sexual activity: Never  Other Topics Concern   Not on file  Social History Narrative   Lives at home with family       Before seizure she was let handed    Social Determinants of Health   Financial Resource Strain: Not on file  Food Insecurity: Not on file  Transportation Needs: Not on file  Physical Activity: Not on file  Stress: Not on file  Social Connections: Not on file  Intimate Partner Violence: Not on file     PHYSICAL EXAM: Vitals:   08/29/22 1020  BP: 112/76  Pulse: 95  SpO2: 97%   General: No acute distress, flat affect with poor eye contact, psychomotor slowing Head:   Normocephalic/atraumatic Skin/Extremities: No rash, no edema Neurological Exam: Mental status: awake and alert but non-verbal. Appears to try to vocalize, unable to name or repeat, able to follow commands.  Cranial nerves: CN I: not tested CN II: pupils equal, round, visual fields intact CN III, IV, VI:  full range of motion, no nystagmus, no ptosis CN V: facial sensation intact CN VII: upper and lower face symmetric CN VIII: hearing intact to conversation Bulk & Tone: possible cogwheeling on right, no fasciculations. Motor: 5/5 throughout with no pronator drift. Sensation: unable to assess, withdraws to touch Deep Tendon Reflexes: +2 throughout Cerebellar: no incoordination on finger to nose testing Gait: hunched posture, slow and cautious, no ataxia Tremor: none   IMPRESSION: This is a 57 year old right-handed woman with a history of mild intellectual disability, hypertension, diabetes, presenting for evaluation of seizures. She has had episodes of staring/behavioral arrest, right facial twitching, and a GTC last 08/22/22. There appears to be some inconsistencies with taking the Levetiracetam, however sister reports that she has been giving Levetiracetam 1000mg  BID consistently since hospital discharge a week ago. She is also on daily low dose lorazepam, started in 2022 after an episode of catatonia that resolved with lorazepam. She has a very flat affect with psychomotor slowing, and appears to have mutism versus expressive aphasia. Unclear if this is a prolonged post-ictal event or if she is having subclinical seizures, we will do a stat EEG in the office today, and if normal, plan for a 24-hour EEG. She will be referred for home health, PT/OT/ST. Follow-up in 1 month, call for any changes.    Thank you for allowing me to participate in the care of this patient. Please do not hesitate to call for any questions or concerns.   Ellouise Newer, M.D.  CC: Dr. Tyrone Nine, Dr. Berdine Addison

## 2022-08-29 NOTE — Progress Notes (Signed)
EEG complete - results pending 

## 2022-08-29 NOTE — Patient Instructions (Addendum)
Good to meet you.  EEG today  2. Depending on EEG results, we may adjust seizure medication or have closer follow-up with Psychiatry  3. Referral will be sent for Home health, physical therapy, occupational therapy, and speech therapy  4. Follow-up in 1 month, call for any changes

## 2022-08-30 ENCOUNTER — Ambulatory Visit: Payer: Medicaid Other | Admitting: Neurology

## 2022-08-30 ENCOUNTER — Telehealth: Payer: Self-pay | Admitting: Neurology

## 2022-08-30 DIAGNOSIS — R569 Unspecified convulsions: Secondary | ICD-10-CM

## 2022-08-30 NOTE — Progress Notes (Signed)
Ambulatory EEG hooked up and running. Light flashing. Push button tested. Camera and event log explained. Batteries explained. Patient understood.   

## 2022-08-30 NOTE — Telephone Encounter (Signed)
Pt came in for 24 hour EEG and stated she was supposed to give Dr. Delice Lesch the pt's psychiatrist's information. She see's Rainey Pines at [facility] at [place]. [place]. Their phone number is [phone].

## 2022-09-04 NOTE — Procedures (Signed)
ELECTROENCEPHALOGRAM REPORT  Date of Study: 08/29/2022  Patient's Name: [name] MRN: [id number] Date of Birth: 15-May-1966  Referring Provider: Dr. Ellouise Newer  Clinical History: This is a STAT EEG on a 57 year old woman with recent seizure who has not returned to baseline, non-verbal. EEG for classification.  Medications: Keppra, lorazepam, risperdal, Trazodone  Technical Summary: A multichannel digital EEG recording measured by the international 10-20 system with electrodes applied with paste and impedances below 5000 ohms performed in our laboratory with EKG monitoring in an awake and asleep patient.  Hyperventilation was not performed. Photic stimulation was performed.  The digital EEG was referentially recorded, reformatted, and digitally filtered in a variety of bipolar and referential montages for optimal display.    Description: The patient is awake and asleep during the recording.  During maximal wakefulness, there is a symmetric, medium voltage 10 Hz posterior dominant rhythm that attenuates with eye opening.  The record is symmetric.  During drowsiness and sleep, there is an increase in theta slowing of the background with vertex waves seen. Photic stimulation did not elicit any abnormalities.  There were no epileptiform discharges or electrographic seizures seen.    EKG lead was unremarkable.  Impression: This awake and asleep EEG is normal.    Clinical Correlation: A normal EEG does not exclude a clinical diagnosis of epilepsy.  If further clinical questions remain, prolonged EEG may be helpful.  Clinical correlation is advised.   Ellouise Newer, M.D.

## 2022-09-06 NOTE — Telephone Encounter (Signed)
Spoke to sister. Discussed normal 24-hour EEG. She is now back to baseline, talking, ready to go back to [place]. Complete turnaround over the weekend started answering questions, then after came on back. "She is [place] now" per sister. They will be cancelling physical therapy. F/u in 2 months, call for any changes

## 2022-09-07 ENCOUNTER — Telehealth: Payer: Self-pay | Admitting: Anesthesiology

## 2022-09-07 ENCOUNTER — Ambulatory Visit: Payer: Medicaid Other | Admitting: Neurology

## 2022-09-07 NOTE — Telephone Encounter (Signed)
Pt's sister [name] called stating she would like to ask Dr Delice Lesch if she was able to communicate with pt's psychiatrist Leland Her. States she would like to speak to Dr Delice Lesch.

## 2022-09-08 ENCOUNTER — Telehealth: Payer: Self-pay | Admitting: Neurology

## 2022-09-08 NOTE — Telephone Encounter (Signed)
Spoke to [name] at [facility] who has been taking care of her for 20 years. ([place]. Cell [phone]). [name] got a call from sister saying she needed to talk to me and then call sister. [name] has known patient for 20 years and states she has always been on Depakote for mood and Risperdal and that there had been a couple of bumps but otherwise [name] had been very, very stable. No prior history of catatonia. [name] expressed concern about sister's care. [name] had previously been in a group home then her sister took her out and has a complicated living situation. [name] reports that on prior visits, sister had just stopped giving all her medications and refused to restart. When she started having issues, sister allowed to restarted Risperdal. [name] reports sister is not compliant because if she thinks [name] does not need it, she won't give it. She has never been typically depressed, she just looked tired and really quiet/withdrawn on last visit with [name]. She is always kind of flat and quiet but sister was doing the talking. Discussed [name]'s seizure on 3/5, now on Keppra. Discussed normal 24-hour EEG and how sister told me 2 days ago that [facility] was back to baseline. [name] will be reaching out to [facility] to keep an eye on patient if she is back.

## 2022-09-11 NOTE — Telephone Encounter (Signed)
Pt sister called informed that Dr Delice Lesch was able to speak with Vaughan Basta. Let us know if any other concerns. Dr Delice Lesch will be out of the office this week and next, can send message on MyChart if she wishes.

## 2022-09-11 NOTE — Telephone Encounter (Signed)
Pls let her know I was able to speak with Vaughan Basta. Let us know if any other concerns. I will be out of the office this week and next, can send message on MyChart if she wishes. Thanks

## 2022-11-01 NOTE — Procedures (Signed)
ELECTROENCEPHALOGRAM REPORT  Dates of Recording: 08/30/2022 12:13PM to 08/31/2022 2:57PM  Patient's Name: [name] MRN: [id number] Date of Birth: 07-17-1965  Referring Provider: Dr. Patrcia Dolly  Procedure: 28-hour ambulatory video EEG  History: This is a 57 year old woman with recent seizure who has not returned to baseline, non-verbal. EEG to assess for subclinical seizures.   Medications: Keppra, lorazepam, risperdal, Trazodone  Technical Summary: This is a 28-hour multichannel digital video EEG recording measured by the international 10-20 system with electrodes applied with paste and impedances below 5000 ohms performed as portable with EKG monitoring.  The digital EEG was referentially recorded, reformatted, and digitally filtered in a variety of bipolar and referential montages for optimal display.    DESCRIPTION OF RECORDING: During maximal wakefulness, the background activity consisted of a symmetric 10 Hz posterior dominant rhythm which was reactive to eye opening.  There were periods of 2-4 Hz activity seen over the frontal leads correlating with eye movement artifact. There were no epileptiform discharges or focal slowing seen in wakefulness.  During the recording, the patient progresses through wakefulness, drowsiness, and Stage 2 sleep.  Again, there were no epileptiform discharges seen.  Events: There were several push button events where patient is seen eating, feeding herself. Electrographically, there were no EEG or EKG changes seen.   There were no electrographic seizures seen.  EKG lead was unremarkable.  IMPRESSION: This 28-hour ambulatory video EEG study is normal.    CLINICAL CORRELATION: A normal EEG does not exclude a clinical diagnosis of epilepsy. No subclinical seizures seen. If further clinical questions remain, inpatient video EEG monitoring may be helpful.   Patrcia Dolly, M.D.

## 2022-12-20 ENCOUNTER — Other Ambulatory Visit (HOSPITAL_COMMUNITY)
Admission: RE | Admit: 2022-12-20 | Discharge: 2022-12-20 | Disposition: A | Discharge status: Home / Self Care | Payer: MEDICAID | Source: Ambulatory Visit | Attending: Family Medicine | Admitting: Family Medicine

## 2022-12-20 ENCOUNTER — Other Ambulatory Visit: Payer: Self-pay | Admitting: Family Medicine

## 2022-12-20 DIAGNOSIS — Z124 Encounter for screening for malignant neoplasm of cervix: Secondary | ICD-10-CM | POA: Diagnosis not present

## 2022-12-25 LAB — MOLECULAR ANCILLARY ONLY
Bacterial Vaginitis (gardnerella): NEGATIVE
Candida Glabrata: NEGATIVE
Candida Vaginitis: NEGATIVE
Comment: NEGATIVE
Comment: NEGATIVE
Comment: NEGATIVE

## 2022-12-26 LAB — CYTOLOGY - PAP
Adequacy: ABSENT
Chlamydia: NEGATIVE
Comment: NEGATIVE
Comment: NEGATIVE
Comment: NEGATIVE
Comment: NEGATIVE
Comment: NORMAL
Diagnosis: NEGATIVE
HSV1: NEGATIVE
HSV2: NEGATIVE
High risk HPV: NEGATIVE
Neisseria Gonorrhea: NEGATIVE
Trichomonas: NEGATIVE

## 2023-02-26 ENCOUNTER — Emergency Department (HOSPITAL_COMMUNITY): Payer: MEDICAID

## 2023-02-26 ENCOUNTER — Other Ambulatory Visit: Payer: Self-pay

## 2023-02-26 ENCOUNTER — Emergency Department (HOSPITAL_COMMUNITY)
Admission: EM | Admit: 2023-02-26 | Discharge: 2023-02-27 | Disposition: A | Discharge status: Home / Self Care | Payer: MEDICAID | Attending: Emergency Medicine | Admitting: Emergency Medicine

## 2023-02-26 DIAGNOSIS — R531 Weakness: Secondary | ICD-10-CM | POA: Insufficient documentation

## 2023-02-26 DIAGNOSIS — E119 Type 2 diabetes mellitus without complications: Secondary | ICD-10-CM | POA: Diagnosis not present

## 2023-02-26 DIAGNOSIS — N3001 Acute cystitis with hematuria: Secondary | ICD-10-CM | POA: Insufficient documentation

## 2023-02-26 DIAGNOSIS — Z7984 Long term (current) use of oral hypoglycemic drugs: Secondary | ICD-10-CM | POA: Insufficient documentation

## 2023-02-26 DIAGNOSIS — Z20822 Contact with and (suspected) exposure to covid-19: Secondary | ICD-10-CM | POA: Diagnosis not present

## 2023-02-26 DIAGNOSIS — Z794 Long term (current) use of insulin: Secondary | ICD-10-CM | POA: Insufficient documentation

## 2023-02-26 LAB — COMPREHENSIVE METABOLIC PANEL
ALT: 30 U/L (ref 0–44)
AST: 39 U/L (ref 15–41)
Albumin: 4.3 g/dL (ref 3.5–5.0)
Alkaline Phosphatase: 75 U/L (ref 38–126)
Anion gap: 11 (ref 5–15)
BUN: 10 mg/dL (ref 6–20)
CO2: 27 mmol/L (ref 22–32)
Calcium: 9.5 mg/dL (ref 8.9–10.3)
Chloride: 103 mmol/L (ref 98–111)
Creatinine, Ser: 0.78 mg/dL (ref 0.44–1.00)
GFR, Estimated: >60 mL/min (ref >60)
Glucose, Bld: 128 mg/dL — ABNORMAL HIGH (ref 70–99)
Potassium: 3.6 mmol/L (ref 3.5–5.1)
Sodium: 141 mmol/L (ref 135–145)
Total Bilirubin: 0.8 mg/dL (ref 0.3–1.2)
Total Protein: 8.6 g/dL — ABNORMAL HIGH (ref 6.5–8.1)

## 2023-02-26 LAB — SARS CORONAVIRUS 2 BY RT PCR: SARS Coronavirus 2 by RT PCR: NEGATIVE

## 2023-02-26 LAB — CBC WITH DIFFERENTIAL/PLATELET
Abs Immature Granulocytes: 0.06 10*3/uL (ref 0.00–0.07)
Basophils Absolute: 0.1 10*3/uL (ref 0.0–0.1)
Basophils Relative: 1 %
Eosinophils Absolute: 0.1 10*3/uL (ref 0.0–0.5)
Eosinophils Relative: 1 %
HCT: 41.6 % (ref 36.0–46.0)
Hemoglobin: 12.9 g/dL (ref 12.0–15.0)
Immature Granulocytes: 1 %
Lymphocytes Relative: 33 %
Lymphs Abs: 2.6 10*3/uL (ref 0.7–4.0)
MCH: 29.5 pg (ref 26.0–34.0)
MCHC: 31 g/dL (ref 30.0–36.0)
MCV: 95 fL (ref 80.0–100.0)
Monocytes Absolute: 0.7 10*3/uL (ref 0.1–1.0)
Monocytes Relative: 9 %
Neutro Abs: 4.4 10*3/uL (ref 1.7–7.7)
Neutrophils Relative %: 55 %
Platelets: 349 10*3/uL (ref 150–400)
RBC: 4.38 MIL/uL (ref 3.87–5.11)
RDW: 14.1 % (ref 11.5–15.5)
WBC: 7.9 10*3/uL (ref 4.0–10.5)
nRBC: 0 % (ref 0.0–0.2)

## 2023-02-26 LAB — URINALYSIS, ROUTINE W REFLEX MICROSCOPIC
Bacteria, UA: NONE SEEN
Bilirubin Urine: NEGATIVE
Glucose, UA: NEGATIVE mg/dL
Ketones, ur: NEGATIVE mg/dL
Nitrite: NEGATIVE
Protein, ur: NEGATIVE mg/dL
Specific Gravity, Urine: 1.003 — ABNORMAL LOW (ref 1.005–1.030)
pH: 6 (ref 5.0–8.0)

## 2023-02-26 LAB — TROPONIN I (HIGH SENSITIVITY)
Troponin I (High Sensitivity): 2 ng/L (ref <18)
Troponin I (High Sensitivity): 3 ng/L (ref <18)

## 2023-02-26 MED ORDER — LEVETIRACETAM 500 MG PO TABS
1000.0000 mg | ORAL_TABLET | Freq: Once | ORAL | Status: AC
  Administered 2023-02-26: 1000 mg via ORAL
  Filled 2023-02-26: qty 2

## 2023-02-26 MED ORDER — CEPHALEXIN 500 MG PO CAPS
500.0000 mg | ORAL_CAPSULE | Freq: Four times a day (QID) | ORAL | 0 refills | Status: DC
Start: 2023-02-26 — End: 2023-03-13

## 2023-02-26 MED ORDER — CEPHALEXIN 500 MG PO CAPS
500.0000 mg | ORAL_CAPSULE | Freq: Once | ORAL | Status: AC
  Administered 2023-02-26: 500 mg via ORAL
  Filled 2023-02-26: qty 1

## 2023-02-26 NOTE — ED Triage Notes (Signed)
Patient brought in by EMS from [facility] with c/o just not feeling well. Patient c/o weakness and dizziness for the last few days. She denies chest pain, ABD discomfort or SOB. She has HX of diabetes. 188/92 86 8% RA CBG: 174

## 2023-02-26 NOTE — ED Provider Notes (Signed)
[place] EMERGENCY DEPARTMENT AT [facility] Provider Note   CSN: [id number] Arrival date & time: 02/26/23  1353     History  Chief Complaint  Patient presents with   Weakness    [name] is a 57 y.o. female history of intellectual disability, seizures, diabetes presented with weakness.  Patient was at mental baseline and states that she has been feeling weak the past few months.  Patient is unable to further describe the weakness or what other symptoms she is having with me.  [facility] and spoke with [name] who states that patient is normally bright and bubbly however today appeared weaker than normal and patient stated that she felt that it was [name] sugar she has not been using [name] insulin and requested that EMS be called.  [name] states that sugar at the time of EMS was normal.  [name] states that patient has not endorsed any chest pain, shortness of breath, abdominal pain, LOC, dysuria, new medications, sick contacts but states that [name] [name] will be arriving the ER shortly.  Home Medications Prior to Admission medications   Medication Sig Start Date End Date Taking? Authorizing Provider  cephALEXin (KEFLEX) 500 MG capsule Take 1 capsule (500 mg total) by mouth 4 (four) times daily. 02/26/23  Yes Elanora Quin, Beverly Gust, PA-C  acetaminophen (TYLENOL) 325 MG tablet Take 2 tablets (650 mg total) by mouth every 6 (six) hours as needed for mild pain (or Fever >/= 101). 03/23/21   Russella Dar, NP  glimepiride (AMARYL) 2 MG tablet Take 2 mg by mouth daily before breakfast.    [provider]  insulin aspart (NOVOLOG) 100 UNIT/ML injection Inject 1-7 Units into the skin 3 (three) times daily as needed for high blood sugar (Per sliding scale: 120-150=1 unit, 150-200=2 units, 201-250=4 units, 251-300=6 units).    [provider]  insulin aspart (NOVOLOG) 100 UNIT/ML injection Inject 8 Units into the skin 3 (three) times daily with  meals. Patient taking differently: Inject 14 Units into the skin 3 (three) times daily with meals. 03/23/21   Russella Dar, NP  insulin glargine (LANTUS) 100 unit/mL SOPN Inject 30 Units into the skin at bedtime. Patient taking differently: Inject 38 Units into the skin at bedtime. 03/23/21   Russella Dar, NP  levETIRAcetam (KEPPRA) 1000 MG tablet Take 1,000 mg by mouth 2 (two) times daily.    [provider]  loratadine (CLARITIN) 10 MG tablet Take 10 mg by mouth daily.    [provider]  LORazepam (ATIVAN) 0.5 MG tablet Take 1 tablet (0.5 mg total) by mouth daily before breakfast. 03/24/21   Russella Dar, NP  metFORMIN (GLUCOPHAGE) 500 MG tablet Take 500 mg by mouth 2 (two) times daily with a meal.    [provider]  metoprolol succinate (TOPROL-XL) 25 MG 24 hr tablet Take 0.5 tablets (12.5 mg total) by mouth daily. 03/23/21   Russella Dar, NP  Multiple Vitamin (MULTIVITAMIN WITH MINERALS) TABS tablet Take 1 tablet by mouth daily. 03/23/21   Russella Dar, NP  polyethylene glycol (MIRALAX / GLYCOLAX) 17 g packet Take 17 g by mouth 2 (two) times daily. 03/23/21   Russella Dar, NP  risperiDONE (RISPERDAL) 2 MG tablet Take 1 tablet (2 mg total) by mouth at bedtime. 11/03/20 08/29/22  Jerald Kief, MD  senna-docusate (SENOKOT-S) 8.6-50 MG tablet Take 1 tablet by mouth 2 (two) times daily. 60 03/23/21  Russella Dar, NP  traZODone (DESYREL) 100 MG tablet Take 1 tablet (100 mg total) by mouth at bedtime as needed for sleep. 11/03/20 08/29/22  Jerald Kief, MD  valACYclovir (VALTREX) 500 MG tablet Take 500 mg by mouth at bedtime.    [provider]      Allergies    Penicillins, Sulfa antibiotics, and Tomato    Review of Systems   Review of Systems  Neurological:  Positive for weakness.  Cannot obtain  Physical Exam Updated Vital Signs BP (!) 140/79 (BP Location: Left Arm)   Pulse 77   Temp 97.6 F (36.4 C) (Oral)   Resp 16   Ht  5\' 2"  (1.575 m)   Wt 82 kg   SpO2 100%   BMI 33.06 kg/m  Physical Exam Constitutional:      General: She is not in acute distress.    Comments: Limited history due to patient's baseline intellectual disability  HENT:     Head: Normocephalic.     Mouth/Throat:     Mouth: Mucous membranes are moist.  Eyes:     Extraocular Movements: Extraocular movements intact.     Conjunctiva/sclera: Conjunctivae normal.     Pupils: Pupils are equal, round, and reactive to light.  Cardiovascular:     Rate and Rhythm: Normal rate and regular rhythm.     Pulses: Normal pulses.     Heart sounds: Normal heart sounds.  Pulmonary:     Effort: Pulmonary effort is normal. No respiratory distress.     Breath sounds: Normal breath sounds.  Abdominal:     Palpations: Abdomen is soft.     Tenderness: There is no abdominal tenderness. There is no guarding or rebound.  Musculoskeletal:        General: Normal range of motion.     Cervical back: Normal range of motion.  Skin:    General: Skin is warm and dry.     Comments: No overlying color changes  Neurological:     Mental Status: She is alert and oriented to person, place, and time.     Comments: Able to follow short commands Able to ambulate without issue  Psychiatric:     Comments: Behavior is baseline according to [name]     ED Results / Procedures / Treatments   Labs (all labs ordered are listed, but only abnormal results are displayed) Labs Reviewed  COMPREHENSIVE METABOLIC PANEL - Abnormal; Notable for the following components:      Result Value   Glucose, Bld 128 (*)    Total Protein 8.6 (*)    All other components within normal limits  URINALYSIS, ROUTINE W REFLEX MICROSCOPIC - Abnormal; Notable for the following components:   Color, Urine STRAW (*)    Specific Gravity, Urine 1.003 (*)    Hgb urine dipstick SMALL (*)    Leukocytes,Ua SMALL (*)    All other components within normal limits  SARS CORONAVIRUS 2 BY RT PCR  CBC WITH  DIFFERENTIAL/PLATELET  LEVETIRACETAM LEVEL  TROPONIN I (HIGH SENSITIVITY)  TROPONIN I (HIGH SENSITIVITY)    EKG None  Radiology CT Head Wo Contrast  Result Date: 02/26/2023 CLINICAL DATA:  Weakness and dizziness EXAM: CT HEAD WITHOUT CONTRAST TECHNIQUE: Contiguous axial images were obtained from the base of the skull through the vertex without intravenous contrast. RADIATION DOSE REDUCTION: This exam was performed according to the departmental dose-optimization program which includes automated exposure control, adjustment of the mA and/or kV according to patient size and/or use  of iterative reconstruction technique. COMPARISON:  08/21/2022 FINDINGS: Brain: No evidence of acute infarction, hemorrhage, mass, mass effect, or midline shift. No hydrocephalus or extra-axial fluid collection. Basal ganglia calcifications. Partial empty sella. Normal craniocervical junction. Vascular: No hyperdense vessel. Skull: Negative for fracture or focal lesion. Hyperostosis frontalis. Sinuses/Orbits: Mucous retention cyst in the right maxillary sinus. No acute finding in the orbits. Other: The mastoid air cells are well aerated. IMPRESSION: No acute intracranial process. Electronically Signed   By: Wiliam Ke M.D.   On: 02/26/2023 18:04    Procedures Procedures    Medications Ordered in ED Medications  cephALEXin (KEFLEX) capsule 500 mg (has no administration in time range)  levETIRAcetam (KEPPRA) tablet 1,000 mg (1,000 mg Oral Given 02/26/23 1728)    ED Course/ Medical Decision Making/ A&P                                 Medical Decision Making Amount and/or Complexity of Data Reviewed Labs: ordered. Radiology: ordered.  Risk Prescription drug management.   MYRAKLE SALASAR 57 y.o. presented today for weakness. Working DDx that I considered at this time includes, but not limited to, electrolyte abnormalities, viral illness, intracranial pathology, UTI, pneumonia, ACS.  R/o DDx: electrolyte  abnormalities, viral illness, intracranial pathology, pneumonia, ACS, CVA/TIA: These are considered less likely due to history of present illness, physical exam, labs/imaging findings  Review of prior external notes: 08/22/2022 ED  Unique Tests and My Interpretation:  CBC: Unremarkable CMP: Unremarkable UA: Small leukocytes COVID: Negative Troponin: 2, 3 Chest x-ray: Similar to previous reading, no acute changes CT head without contrast: No acute intracranial pathology EKG: Sinus 85 beats per minute, no ST elevations or blocks noted Levetiracetam level: Pending  Discussion with Independent Historian:  [name]  Discussion of Management of Tests: None  Risk: Medium: prescription drug management  Risk Stratification Score: none  Plan: On exam patient was in no acute distress with stable vitals.  Patient was at [name] mental baseline however was not able to give much of a history and so [facility] called for further history and states that patient has become weak starting today which is different from [name] baseline and they said that patient told [name] it was [name] sugars although sugars are normal.  Currently awaiting [name] for further information but will start broad workup as patient cannot fully express [name] symptoms and does have significant past medical history.  I spoke to his [name] and [name] stated that she thinks this is related to patient missing [name] medications this morning.  [name] stated that patient was at [name] baseline last night and now and the only thing different is that the patient did not take [name] medications this morning.  When asked about what medications or missed [name] he is unsure but states that she knows patient did not take [name] Keppra.  Will give patient's 1000 mg of Keppra that she missed this morning as patient does not appear altered and has patent airway.  We will continue with broad workup and monitor patient.  Patient's UA came back with small leukocytes  indicative of possible UTI.  On recheck patient is resting comfortably on [name] phone and does not appear to be in any distress.  Awaiting delta troponin and the rest of patient's labs and if negative will discharge on Keflex.  Delta troponin negative.  Patient was seen walking around the ER and eating food comfortably during  [name] 6-hour stay here. I Spoke to the patient she states she feels much better and is okay with being discharged.  Patient was given 1 dose of Keflex here in the ER before discharge and given a prescription as [name] UA did show small leukocytes in conjunction with [name] being weak will cover for UTI.  Pending patient's chest x-ray patient will be discharged.  Chest x-ray shows possible aneurysm versus vascular congestion versus abnormality due to rotation I recommend 2 view chest x-ray.  2 view chest x-ray ordered.  Patient still stable in the ER.  Patient signed out to [name], [place].  Plan is to discharge pending chest x-ray and treat with Keflex for suspected UTI.         Final Clinical Impression(s) / ED Diagnoses Final diagnoses:  Weakness  Acute cystitis with hematuria    Rx / DC Orders ED Discharge Orders          Ordered    cephALEXin (KEFLEX) 500 MG capsule  4 times daily        02/26/23 1952              Remi Deter 02/26/23 2212    Alvira Monday, MD 02/27/23 1315

## 2023-02-26 NOTE — Discharge Instructions (Addendum)
Please follow-up with primary care provider regarding symptoms and ER visit.  Today your labs and imaging show that you have a UTI you are treated with 1 dose of Keflex here but when you pick up the rest of the prescription.  Please take your medications as prescribed.If symptoms change or worsen please return to ER.

## 2023-02-27 LAB — LEVETIRACETAM LEVEL: Levetiracetam Lvl: 8.7 ug/mL — ABNORMAL LOW (ref 10.0–40.0)

## 2023-02-27 NOTE — ED Notes (Signed)
RN has attempted to call sister twice with no answer, RN will attempt to call shortly again. Voicemail left

## 2023-02-27 NOTE — ED Notes (Signed)
Pt sister [name] notified pt on the way home via ptar.

## 2023-02-27 NOTE — ED Notes (Signed)
Patient Alert and oriented to baseline. Stable and ambulatory to baseline. Patient verbalized understanding of the discharge instructions.  Patient belongings were taken by the patient.   

## 2023-02-27 NOTE — ED Provider Notes (Signed)
Received at shift change from Wilfred Lacy, PA-C please see his note for full detail  In short patient with medical history including intellectual delay, seizures, diabetes, presenting from group home due to weakness.  Patient is difficult historian but states she has no complaints at this time.  Not endorsing any current chest pain shortness of breath stomach pains nausea or vomiting denies any urinary symptoms.  Per previous provider likely patient be discharged home start on antibiotics for concerns of UTI, follow-up on chest x-ray.   Physical Exam  BP 127/80 (BP Location: Left Arm)   Pulse 89   Temp 98.4 F (36.9 C) (Oral)   Resp 16   Ht 5\' 2"  (1.575 m)   Wt 82 kg   SpO2 97%   BMI 33.06 kg/m   Physical Exam Vitals and nursing note reviewed.  Constitutional:      General: She is not in acute distress.    Appearance: She is not ill-appearing.  HENT:     Head: Normocephalic and atraumatic.     Nose: No congestion.  Eyes:     Conjunctiva/sclera: Conjunctivae normal.  Cardiovascular:     Rate and Rhythm: Normal rate and regular rhythm.     Pulses: Normal pulses.     Heart sounds: No murmur heard.    No friction rub. No gallop.  Pulmonary:     Effort: No respiratory distress.     Breath sounds: No wheezing, rhonchi or rales.  Abdominal:     Palpations: Abdomen is soft.     Tenderness: There is no abdominal tenderness. There is no right CVA tenderness or left CVA tenderness.  Skin:    General: Skin is warm and dry.  Neurological:     Mental Status: She is alert.     GCS: GCS eye subscore is 4. GCS verbal subscore is 5. GCS motor subscore is 6.     Cranial Nerves: Cranial nerves 2-12 are intact.     Sensory: Sensation is intact.     Motor: Motor function is intact.     Coordination: Romberg sign negative. Finger-Nose-Finger Test normal.     Gait: Gait is intact.     Comments: Cranial nerves II through XII grossly intact no difficulty with word finding following two-step  commands there is no unilateral weakness present, gait fully intact.  Psychiatric:        Mood and Affect: Mood normal.     Procedures  Procedures  ED Course / MDM    Medical Decision Making Amount and/or Complexity of Data Reviewed Labs: ordered. Radiology: ordered.  Risk Prescription drug management.   Lab Tests:  I Ordered, and personally interpreted labs.  The pertinent results include: CBC is unremarkable, CMP reveals glucose 128, total protein 8.6, negative delta troponin, UA shows small leukocytes white blood cells no bacteria respiratory panel negative   Imaging Studies ordered:  I ordered imaging studies including CT head, chest x-ray I independently visualized and interpreted imaging which showed both which are negative acute findings I agree with the radiologist interpretation   Cardiac Monitoring:  The patient was maintained on a cardiac monitor.  I personally viewed and interpreted the cardiac monitored which showed an underlying rhythm of: Without signs of ischemia   Medicines ordered and prescription drug management:  I ordered medication including N/A  I have reviewed the patients home medicines and have made adjustments as needed  Critical Interventions:  N/a   Reevaluation:  Presented with weakness, she had a benign physical  exam, she is having no complaints, spoke with patient's caregiver [name] explained to her patient's workup, she is in agreement with patient being discharged and placing her on antibiotics.  Consultations Obtained:  N/A    Test Considered:  N/A    Rule out Suspicion for intracranial bleed or CVA is low at this time CT has negative there is no focus present my exam.  I doubt ACS presentation atypical she had negative delta troponin with a negative EKG.  I doubt aneurysm or dissection as patient has no history of this, patient had repeat chest x-ray which reveals no concern for possible aneurysm.  Doubt  intra-abdominal abnormality abdomen is soft nontender vital signs are reassuring she is nontoxic-appearing lab work is unremarkable.    Dispostion and problem list  After consideration of the diagnostic results and the patients response to treatment, I feel that the patent would benefit from discharge.  UTI-started on antibiotics, cultures are pending, will follow-up with PCP as needed strict return precautions.           Carroll Sage, PA-C 02/27/23 0014    Tilden Fossa, MD 02/27/23 0120

## 2023-02-28 LAB — URINE CULTURE

## 2023-03-01 ENCOUNTER — Telehealth: Payer: Self-pay | Admitting: Neurology

## 2023-03-01 NOTE — Telephone Encounter (Signed)
Pt sister called an informed this can be related to a urinary tract infection, the results from the ER indicate recommendation to repeat a urinalysis. Pls have her contact the patient's PCP for repeat UA

## 2023-03-01 NOTE — Telephone Encounter (Signed)
Pls let sister know this can be related to a urinary tract infection, the results from the ER indicate recommendation to repeat a urinalysis. Pls have her contact the patient's PCP for repeat UA, thanks

## 2023-03-01 NOTE — Telephone Encounter (Signed)
Patient was recently visited the hospital at [name] long, patient is having memory loss and loss of bladder control and can no longer function with daily living skills. Patient's sister would like her for to come in. I did make an appointment for Patient on 09/25/23 and added patient to wait list

## 2023-03-02 ENCOUNTER — Other Ambulatory Visit: Payer: Self-pay

## 2023-03-02 ENCOUNTER — Emergency Department (HOSPITAL_COMMUNITY): Payer: MEDICAID

## 2023-03-02 ENCOUNTER — Emergency Department (HOSPITAL_COMMUNITY)
Admission: EM | Admit: 2023-03-02 | Discharge: 2023-03-03 | Disposition: A | Discharge status: Home / Self Care | Payer: MEDICAID | Attending: Emergency Medicine | Admitting: Emergency Medicine

## 2023-03-02 ENCOUNTER — Encounter (HOSPITAL_COMMUNITY): Payer: Self-pay

## 2023-03-02 DIAGNOSIS — R4182 Altered mental status, unspecified: Secondary | ICD-10-CM | POA: Diagnosis not present

## 2023-03-02 DIAGNOSIS — F319 Bipolar disorder, unspecified: Secondary | ICD-10-CM | POA: Diagnosis present

## 2023-03-02 DIAGNOSIS — E119 Type 2 diabetes mellitus without complications: Secondary | ICD-10-CM | POA: Insufficient documentation

## 2023-03-02 DIAGNOSIS — R569 Unspecified convulsions: Secondary | ICD-10-CM | POA: Diagnosis present

## 2023-03-02 DIAGNOSIS — I10 Essential (primary) hypertension: Secondary | ICD-10-CM | POA: Diagnosis not present

## 2023-03-02 DIAGNOSIS — Z79899 Other long term (current) drug therapy: Secondary | ICD-10-CM | POA: Diagnosis not present

## 2023-03-02 DIAGNOSIS — Z7984 Long term (current) use of oral hypoglycemic drugs: Secondary | ICD-10-CM | POA: Diagnosis not present

## 2023-03-02 DIAGNOSIS — F79 Unspecified intellectual disabilities: Secondary | ICD-10-CM

## 2023-03-02 DIAGNOSIS — Z1152 Encounter for screening for COVID-19: Secondary | ICD-10-CM | POA: Diagnosis not present

## 2023-03-02 DIAGNOSIS — F061 Catatonic disorder due to known physiological condition: Secondary | ICD-10-CM

## 2023-03-02 DIAGNOSIS — Z794 Long term (current) use of insulin: Secondary | ICD-10-CM | POA: Insufficient documentation

## 2023-03-02 LAB — URINALYSIS, W/ REFLEX TO CULTURE (INFECTION SUSPECTED)
Bilirubin Urine: NEGATIVE
Glucose, UA: 100 mg/dL — AB
Hgb urine dipstick: NEGATIVE
Ketones, ur: NEGATIVE mg/dL
Leukocytes,Ua: NEGATIVE
Nitrite: NEGATIVE
Protein, ur: NEGATIVE mg/dL
Specific Gravity, Urine: 1.025 (ref 1.005–1.030)
pH: 6 (ref 5.0–8.0)

## 2023-03-02 LAB — COMPREHENSIVE METABOLIC PANEL
ALT: 26 U/L (ref 0–44)
AST: 39 U/L (ref 15–41)
Albumin: 3.8 g/dL (ref 3.5–5.0)
Alkaline Phosphatase: 70 U/L (ref 38–126)
Anion gap: 13 (ref 5–15)
BUN: 8 mg/dL (ref 6–20)
CO2: 24 mmol/L (ref 22–32)
Calcium: 9.6 mg/dL (ref 8.9–10.3)
Chloride: 104 mmol/L (ref 98–111)
Creatinine, Ser: 0.84 mg/dL (ref 0.44–1.00)
GFR, Estimated: >60 mL/min (ref >60)
Glucose, Bld: 129 mg/dL — ABNORMAL HIGH (ref 70–99)
Potassium: 4.5 mmol/L (ref 3.5–5.1)
Sodium: 141 mmol/L (ref 135–145)
Total Bilirubin: 0.7 mg/dL (ref 0.3–1.2)
Total Protein: 7.7 g/dL (ref 6.5–8.1)

## 2023-03-02 LAB — CBC WITH DIFFERENTIAL/PLATELET
Abs Immature Granulocytes: 0.04 10*3/uL (ref 0.00–0.07)
Basophils Absolute: 0.1 10*3/uL (ref 0.0–0.1)
Basophils Relative: 1 %
Eosinophils Absolute: 0.1 10*3/uL (ref 0.0–0.5)
Eosinophils Relative: 1 %
HCT: 41.5 % (ref 36.0–46.0)
Hemoglobin: 12.9 g/dL (ref 12.0–15.0)
Immature Granulocytes: 1 %
Lymphocytes Relative: 35 %
Lymphs Abs: 2.9 10*3/uL (ref 0.7–4.0)
MCH: 29.9 pg (ref 26.0–34.0)
MCHC: 31.1 g/dL (ref 30.0–36.0)
MCV: 96.3 fL (ref 80.0–100.0)
Monocytes Absolute: 0.7 10*3/uL (ref 0.1–1.0)
Monocytes Relative: 8 %
Neutro Abs: 4.7 10*3/uL (ref 1.7–7.7)
Neutrophils Relative %: 54 %
Platelets: 349 10*3/uL (ref 150–400)
RBC: 4.31 MIL/uL (ref 3.87–5.11)
RDW: 13.9 % (ref 11.5–15.5)
WBC: 8.5 10*3/uL (ref 4.0–10.5)
nRBC: 0 % (ref 0.0–0.2)

## 2023-03-02 LAB — RESP PANEL BY RT-PCR (RSV, FLU A&B, COVID)  RVPGX2
Influenza A by PCR: NEGATIVE
Influenza B by PCR: NEGATIVE
Resp Syncytial Virus by PCR: NEGATIVE
SARS Coronavirus 2 by RT PCR: NEGATIVE

## 2023-03-02 LAB — TROPONIN I (HIGH SENSITIVITY)
Troponin I (High Sensitivity): 4 ng/L (ref <18)
Troponin I (High Sensitivity): 4 ng/L (ref <18)

## 2023-03-02 LAB — TSH: TSH: 1.149 u[IU]/mL (ref 0.350–4.500)

## 2023-03-02 MED ORDER — LEVETIRACETAM 500 MG PO TABS
1250.0000 mg | ORAL_TABLET | Freq: Two times a day (BID) | ORAL | Status: DC
  Administered 2023-03-03: 1250 mg via ORAL
  Filled 2023-03-02: qty 1

## 2023-03-02 MED ORDER — INSULIN GLARGINE 100 UNITS/ML SOLOSTAR PEN: 38.0000 [IU] | PEN_INJECTOR | Freq: Every day | SUBCUTANEOUS | Status: DC

## 2023-03-02 MED ORDER — SODIUM CHLORIDE 0.9 % IV SOLN
2000.0000 mg | Freq: Once | INTRAVENOUS | Status: DC
Start: 2023-03-02 — End: 2023-03-02

## 2023-03-02 MED ORDER — GLIMEPIRIDE 2 MG PO TABS
2.0000 mg | ORAL_TABLET | Freq: Every day | ORAL | Status: DC
  Administered 2023-03-03: 2 mg via ORAL
  Filled 2023-03-02 (×2): qty 1

## 2023-03-02 MED ORDER — INSULIN ASPART 100 UNIT/ML IJ SOLN
0.0000 [IU] | INTRAMUSCULAR | Status: DC
  Administered 2023-03-03: 3 [IU] via SUBCUTANEOUS
  Administered 2023-03-03 (×2): 2 [IU] via SUBCUTANEOUS

## 2023-03-02 MED ORDER — LEVETIRACETAM IN NACL 1000 MG/100ML IV SOLN
1000.0000 mg | INTRAVENOUS | Status: AC
  Administered 2023-03-02 (×2): 1000 mg via INTRAVENOUS
  Filled 2023-03-02 (×2): qty 100

## 2023-03-02 MED ORDER — METOPROLOL SUCCINATE ER 25 MG PO TB24
12.5000 mg | ORAL_TABLET | Freq: Every day | ORAL | Status: DC
  Administered 2023-03-03: 12.5 mg via ORAL
  Filled 2023-03-02: qty 1

## 2023-03-02 MED ORDER — METFORMIN HCL 500 MG PO TABS
500.0000 mg | ORAL_TABLET | Freq: Two times a day (BID) | ORAL | Status: DC
  Administered 2023-03-03: 500 mg via ORAL
  Filled 2023-03-02: qty 1

## 2023-03-02 MED ORDER — INSULIN ASPART 100 UNIT/ML ~~LOC~~ SOLN: 1.0000 [IU] | Freq: Three times a day (TID) | SUBCUTANEOUS | Status: DC | PRN

## 2023-03-02 MED ORDER — LORAZEPAM 2 MG/ML IJ SOLN
1.0000 mg | Freq: Once | INTRAMUSCULAR | Status: AC
  Administered 2023-03-02: 1 mg via INTRAVENOUS
  Filled 2023-03-02: qty 1

## 2023-03-02 MED ORDER — INSULIN ASPART 100 UNIT/ML IJ SOLN: 14.0000 [IU] | Freq: Three times a day (TID) | INTRAMUSCULAR | Status: DC

## 2023-03-02 MED ORDER — ACETAMINOPHEN 325 MG PO TABS: 650.0000 mg | ORAL_TABLET | Freq: Four times a day (QID) | ORAL | Status: DC | PRN

## 2023-03-02 NOTE — ED Triage Notes (Signed)
Lives with family coming from home 10-15 min witnessed focal seizure @ 1530 hx of seizures has been altered more then baseline has been dx with UTI recently.  Ambulatory and obeys commands at baseline if you walk he through what is needed

## 2023-03-02 NOTE — ED Notes (Signed)
Patient's clothes inventoried/labelled and stored at locker #8 at [facility].

## 2023-03-02 NOTE — ED Provider Notes (Signed)
[facility] AT [facility] Provider Note   CSN: [id number] Arrival date & time: 03/02/23  1618     History  Chief Complaint  Patient presents with   Seizures    [name] is a 57 y.o. female.  Patient is a 57 year old female with a history of intellectual delay, seizures, diabetes, immune deficiency disorder and hypertension who is presenting today with EMS after a witnessed focal seizure per her family.  They reported that it lasted between 10 to 15 minutes per EMS.  Patient was seen 4 days ago for weakness which was nonspecific except that she just was not as bubbly and bright as she normally was.  Patient was able to have a full conversation with the provider who saw her 4 days ago.  Today she will look at you and follow some commands but will not say anything.  Attempted to contact patient's caregiver but she did not answer the phone.  Per notes patient had been having worsening altered mental status since being seen and she initially was given antibiotics for presumed UTI based on lab work earlier this week.  Based on a note left for patient's neurologist she was not able to control her bladder and was having issues with her memory which was worsening over this week.  Unclear if patient has been having any cough, congestion or fevers.  The history is provided by the EMS personnel and medical records. The history is limited by the absence of a caregiver.  Seizures      Home Medications Prior to Admission medications   Medication Sig Start Date End Date Taking? Authorizing Provider  acetaminophen (TYLENOL) 325 MG tablet Take 2 tablets (650 mg total) by mouth every 6 (six) hours as needed for mild pain (or Fever >/= 101). 03/23/21   Russella Dar, NP  cephALEXin (KEFLEX) 500 MG capsule Take 1 capsule (500 mg total) by mouth 4 (four) times daily. 02/26/23   Netta Corrigan, PA-C  glimepiride (AMARYL) 2 MG tablet Take 2 mg by mouth daily before  breakfast.    [provider]  insulin aspart (NOVOLOG) 100 UNIT/ML injection Inject 1-7 Units into the skin 3 (three) times daily as needed for high blood sugar (Per sliding scale: 120-150=1 unit, 150-200=2 units, 201-250=4 units, 251-300=6 units).    [provider]  insulin aspart (NOVOLOG) 100 UNIT/ML injection Inject 8 Units into the skin 3 (three) times daily with meals. Patient taking differently: Inject 14 Units into the skin 3 (three) times daily with meals. 03/23/21   Russella Dar, NP  insulin glargine (LANTUS) 100 unit/mL SOPN Inject 30 Units into the skin at bedtime. Patient taking differently: Inject 38 Units into the skin at bedtime. 03/23/21   Russella Dar, NP  levETIRAcetam (KEPPRA) 1000 MG tablet Take 1,000 mg by mouth 2 (two) times daily.    [provider]  loratadine (CLARITIN) 10 MG tablet Take 10 mg by mouth daily.    [provider]  LORazepam (ATIVAN) 0.5 MG tablet Take 1 tablet (0.5 mg total) by mouth daily before breakfast. 03/24/21   Russella Dar, NP  metFORMIN (GLUCOPHAGE) 500 MG tablet Take 500 mg by mouth 2 (two) times daily with a meal.    [provider]  metoprolol succinate (TOPROL-XL) 25 MG 24 hr tablet Take 0.5 tablets (12.5 mg total) by mouth daily. 03/23/21   Russella Dar, NP  Multiple Vitamin (MULTIVITAMIN WITH MINERALS) TABS tablet Take 1  tablet by mouth daily. 03/23/21   Russella Dar, NP  polyethylene glycol (MIRALAX / GLYCOLAX) 17 g packet Take 17 g by mouth 2 (two) times daily. 03/23/21   Russella Dar, NP  risperiDONE (RISPERDAL) 2 MG tablet Take 1 tablet (2 mg total) by mouth at bedtime. 11/03/20 08/29/22  Jerald Kief, MD  senna-docusate (SENOKOT-S) 8.6-50 MG tablet Take 1 tablet by mouth 2 (two) times daily. 60 03/23/21   Russella Dar, NP  traZODone (DESYREL) 100 MG tablet Take 1 tablet (100 mg total) by mouth at bedtime as needed for sleep. 11/03/20 08/29/22  Jerald Kief, MD   valACYclovir (VALTREX) 500 MG tablet Take 500 mg by mouth at bedtime.    [provider]      Allergies    Penicillins, Sulfa antibiotics, and Tomato    Review of Systems   Review of Systems  Neurological:  Positive for seizures.    Physical Exam Updated Vital Signs BP (!) 120/58 (BP Location: Left Arm)   Pulse 74   Temp 97.6 F (36.4 C) (Oral)   Resp 13   Ht 5\' 2"  (1.575 m)   Wt 82 kg   SpO2 97%   BMI 33.06 kg/m  Physical Exam Vitals and nursing note reviewed.  Constitutional:      General: She is not in acute distress.    Appearance: She is well-developed.  HENT:     Head: Normocephalic and atraumatic.  Eyes:     Conjunctiva/sclera: Conjunctivae normal.     Pupils: Pupils are equal, round, and reactive to light.  Cardiovascular:     Rate and Rhythm: Normal rate and regular rhythm.     Heart sounds: No murmur heard. Pulmonary:     Effort: Pulmonary effort is normal. No respiratory distress.     Breath sounds: Normal breath sounds. No wheezing or rales.  Abdominal:     General: There is no distension.     Palpations: Abdomen is soft.     Tenderness: There is no abdominal tenderness. There is no guarding or rebound.  Musculoskeletal:        General: No tenderness. Normal range of motion.     Cervical back: Normal range of motion and neck supple.  Skin:    General: Skin is warm and dry.     Findings: No erythema or rash.  Neurological:     Mental Status: She is alert and oriented to person, place, and time.     Gait: Gait normal.     Comments: Patient was able to ambulate without difficulty.  She lifts both arms with no evidence of pronator drift in the arms or the legs.  She can follow commands but does not speak.  She just mumbles on occasion.  She does seem to look towards the right but will cross midline when talking with her on the left.  Psychiatric:     Comments: Calm and cooperative     ED Results / Procedures / Treatments   Labs (all labs  ordered are listed, but only abnormal results are displayed) Labs Reviewed  COMPREHENSIVE METABOLIC PANEL - Abnormal; Notable for the following components:      Result Value   Glucose, Bld 129 (*)    All other components within normal limits  URINALYSIS, W/ REFLEX TO CULTURE (INFECTION SUSPECTED) - Abnormal; Notable for the following components:   Glucose, UA 100 (*)    Bacteria, UA RARE (*)    All other components within  normal limits  RESP PANEL BY RT-PCR (RSV, FLU A&B, COVID)  [id number]  CBC WITH DIFFERENTIAL/PLATELET  TSH  TROPONIN I (HIGH SENSITIVITY)  TROPONIN I (HIGH SENSITIVITY)    EKG EKG Interpretation Date/Time:  Friday March 02 2023 17:02:06 EDT Ventricular Rate:  82 PR Interval:  176 QRS Duration:  94 QT Interval:  379 QTC Calculation: 443 R Axis:   -28  Text Interpretation: Sinus rhythm Prominent P waves, nondiagnostic Borderline left axis deviation Low voltage, precordial leads Artifact No significant change since last tracing Confirmed by Gwyneth Sprout ([id number]) on 03/02/2023 5:36:50 PM  Radiology CT Head Wo Contrast  Result Date: 03/02/2023 CLINICAL DATA:  Seizures, altered mental status EXAM: CT HEAD WITHOUT CONTRAST TECHNIQUE: Contiguous axial images were obtained from the base of the skull through the vertex without intravenous contrast. RADIATION DOSE REDUCTION: This exam was performed according to the departmental dose-optimization program which includes automated exposure control, adjustment of the mA and/or kV according to patient size and/or use of iterative reconstruction technique. COMPARISON:  02/26/2023 FINDINGS: Brain: No evidence of acute infarction, hemorrhage, hydrocephalus, extra-axial collection or mass lesion/mass effect. Vascular: No hyperdense vessel or unexpected calcification. Skull: Normal. Negative for fracture or focal lesion. Sinuses/Orbits: The visualized paranasal sinuses are essentially clear. The mastoid air cells are unopacified.  Other: None. IMPRESSION: Normal head CT. Electronically Signed   By: Charline Bills M.D.   On: 03/02/2023 18:58   DG Chest Port 1 View  Result Date: 03/02/2023 CLINICAL DATA:  Seizures, altered mental status EXAM: PORTABLE CHEST 1 VIEW COMPARISON:  02/26/2023 FINDINGS: Lungs are clear.  No pleural effusion or pneumothorax. The heart is top-normal in size. IMPRESSION: No acute cardiopulmonary disease. Electronically Signed   By: Charline Bills M.D.   On: 03/02/2023 18:57    Procedures Procedures    Medications Ordered in ED Medications  levETIRAcetam (KEPPRA) IVPB 1000 mg/100 mL premix (1,000 mg Intravenous New Bag/Given 03/02/23 2208)  LORazepam (ATIVAN) injection 1 mg (1 mg Intravenous Given 03/02/23 2011)    ED Course/ Medical Decision Making/ A&P                                 Medical Decision Making Amount and/or Complexity of Data Reviewed Independent Historian: caregiver and EMS External Data Reviewed: notes. Labs: ordered. Decision-making details documented in ED Course. Radiology: ordered and independent interpretation performed. Decision-making details documented in ED Course.  Risk Prescription drug management.   Pt with multiple medical problems and comorbidities and presenting today with a complaint that caries a high risk for morbidity and mortality.  Here today with symptoms of altered mental status.  Seems that patient's symptoms have progressed over the last 4 days since she was last seen in the emergency room.  At that time she was diagnosed with a UTI however no bacteria was seen it was only 6-10 white cells with culture showing multiple species requiring a redraw.Patient was given Keflex which she has been taking but symptoms have not improved.  Blood sugar is normal today per EMS.  Vital signs are normal.  Patient did have a seizure today despite being compliant with her Keppra.  Patient does not appear to be seizing at this time.  She will cross midline and  she will follow commands.  Possibility that she is still postictal but mental status is significantly different than when she was seen in the emergency room 4 days ago when she was able  to have a full conversation with the provider at that time.  Concern for worsening underlying infection, electrolyte abnormality.  Lower suspicion for medication cause for her symptoms.  Repeat labs imaging pending.  Will reassess to see if there is change in patient's behavior as this could be postictal.  10:15 PM I independently interpreted patient's EKG and labs.  CBC, CMP, TSH, UA are all within normal limits.  EKG without acute changes.  I have independently visualized and interpreted pt's images today.  Chest x-ray and head CT are negative today.  Spoke with Dr. Derry Lory with neurology due to concern for possible postictal as patient is still altered and sister arrived now and reports this has been progressive over this week.  After a thorough chart review patient has had multiple episodes like this and has had prolonged EEGs without any evidence of seizure activity.  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  Patient did have an event per her guardian on Sunday when she was at her boyfriend's house where he had to hold her because she was shaking and thinks that might be what started the process as she has not been the same since then.  However now with patient unable to take care of her ADLs we will do an MRI to rule out any acute brain issues and increase her Keppra to 1250 twice daily but feel that if the patient does not respond to Ativan, MRI is normal she will need psychiatric consultation.  All this was discussed with her sister who is comfortable with this plan.  10:15 PM After Ativan patient is sleepy but her responsiveness is really no different.  She will wake up look around and mumble and then go back to sleep.  MRI normal.  Pt will need psych eval when ativan has  worn off.  Due to concern for catatonia will have psych eval as all medical clearance testing is normal.          Final Clinical Impression(s) / ED Diagnoses Final diagnoses:  None    Rx / DC Orders ED Discharge Orders     None         Gwyneth Sprout, MD 03/02/23 2226

## 2023-03-02 NOTE — ED Notes (Signed)
Seizure pads applied.

## 2023-03-03 LAB — CBG MONITORING, ED
Glucose-Capillary: 220 mg/dL — ABNORMAL HIGH (ref 70–99)
Glucose-Capillary: 188 mg/dL — ABNORMAL HIGH (ref 70–99)

## 2023-03-03 MED ORDER — LEVETIRACETAM 250 MG PO TABS: 1250.0000 mg | ORAL_TABLET | Freq: Two times a day (BID) | ORAL | 2 refills | Status: DC

## 2023-03-03 NOTE — ED Notes (Signed)
Patient was noted by staff this morning to be naked walking the hallway in green zone this morning. We redirected patient back to her room and got her dressed

## 2023-03-03 NOTE — ED Notes (Signed)
Pt sleeping. Will get CBG when she wakes up.

## 2023-03-03 NOTE — Discharge Instructions (Addendum)
Keppra dose has been increased to 1250 mg twice a day.  Continue other home medications as prescribed.  Call Dr. Rosalyn Gess office to set up a close follow-up appointment.  She will need to review current medications and may recommend some further adjustments.  You should hear from social work and home health agencies regarding visit for assessment of home health needs.  Return to the emergency department for any new or worsening symptoms of concern.

## 2023-03-03 NOTE — ED Notes (Signed)
Pt CBG is 106 mg/dL

## 2023-03-03 NOTE — Consult Note (Signed)
Telepsych Consultation   Reason for Consult:  Telepsych Assessment Referring Physician:  Dr. Gwyneth Sprout Location of Patient:   [facility] Location of Provider: Other: Virtual home office  Patient Identification: [name] MRN:  [id number] Principal Diagnosis: AMS (altered mental status) Diagnosis:  Principal Problem:   AMS (altered mental status) Active Problems:   Intellectual disability   Seizure (HCC)   Bipolar disorder (HCC)   Total Time spent with patient: 1 hour  Subjective:   [name] is a 57 y.o. female patient admitted with  Per RN Triage Note dated 03/02/2023@1640  "Lives with family coming from home 10-15 min witnessed focal seizure @ 1530 hx of seizures has been altered more then baseline has been dx with UTI recently. Ambulatory and obeys commands at baseline if you walk he through what is needed "  HPI:   Patient seen via telepsych by this provider; chart reviewed and consulted with Dr. Arneta Cliche on 03/03/23.  On evaluation [name] is seen sitting up in bed, bed lined with seizure pads.  Her hair is neatly groomed, she is appropriately dressed in hospital scrubs.  Pt greeted by this Clinical research associate and given anticipatory guidance. She makes direct eye contact and states, "okay" in agreement to interview.   Patient is alert and oriented to person, place and can articulate events that led to current hospitalization; however, she is unsure of today's date.  She tells me her sister visited her earlier today and she also discusses what she had for lunch.   She jokingly reports she had "mystery meat, cabbage, mashed potatoes and pudding."  States her appetite is usually good and she denies n/v or GI concerns.  She does admit to having mental health dx but is unable to provide details.  She denies suicidal or homicidal ideations.  She does endorse intrusive thoughts of negative self talk, but states this is not new and "I cope with it."   She reports doing okay and  states she believes she takes mental health medications but cannot name her medications because, "my sister takes care of this for me."     She is appropriately interactive, answers questions as best she can but is limited d/t intellectual disability. When asked if she had any concerns to discuss today she states, "my sugar was high."    Per Collateral received from pt [name], she is unsure why patient is being evaluated by psychiatry today. She states her sister has had similar concerns, of not talking or eating that usually occurs following a seizure. States in 2022, pt was admitted for 2 months with similar concerns.  Reports at that time the patient required a feeding tube.  She reports one day she went to visit her and pt was sitting up, talking and asked to go home.  She reports patient is followed by neurology but she's still unclear regarding provocation of patient's symptoms.     She states patient is empanelled with Dr. Annye Asa for psychiatry at [facility].  She states she's seen every 3 months and her next visit is in October.  She states patient takes risperidone 2mg  po at bedtime;is prescribed trazodone but she does not take it because she sleeps well.  She denies questions or concerns this Clinical research associate could address today.   Labs:  CMP within normal limits, except for glucose which is 129mg /dl.   CBC does not show leukocytosis or anemia TSH is WNL at 1.149 EKG is 379/443; no prolonged  QT intervals.  Infectious disease panel is negative Head CT - was normal  During evaluation [name] is observed sitting up in bed, seizure protocol pads in place. She is alert/oriented to person, place and events that led to hospitalization; but she cannot state the date or time. Patient's stated mood is, "I'm okay" she is calm/cooperative; and mood congruent with affect.  Patient is speaking in a clear tone at moderate volume, and normal pace; with good eye contact.  Her thought  process is goal oriented and linear. There is no indication that she is currently responding to internal/external stimuli or experiencing delusional thought content.  Patient denies suicidal/self-harm/homicidal ideation, psychosis, and paranoia.  Patient has remained calm and cooperative throughout assessment and has answered questions as best she could.   Per ED Provider Admission Assessment 03/02/2023@1618 : Chief Complaint  Patient presents with   Seizures      [name] is a 57 y.o. female.   Patient is a 57 year old female with a history of intellectual delay, seizures, diabetes, immune deficiency disorder and hypertension who is presenting today with EMS after a witnessed focal seizure per her family.  They reported that it lasted between 10 to 15 minutes per EMS.  Patient was seen 4 days ago for weakness which was nonspecific except that she just was not as bubbly and bright as she normally was.  Patient was able to have a full conversation with the provider who saw her 4 days ago.  Today she will look at you and follow some commands but will not say anything.  Attempted to contact patient's caregiver but she did not answer the phone.  Per notes patient had been having worsening altered mental status since being seen and she initially was given antibiotics for presumed UTI based on lab work earlier this week.  Based on a note left for patient's neurologist she was not able to control her bladder and was having issues with her memory which was worsening over this week.  Unclear if patient has been having any cough, congestion or fevers.   The history is provided by the EMS personnel and medical records. The history is limited by the absence of a caregiver.  Seizures     Past Psychiatric History: bipolar disorder, mild mental retardation,   Risk to Self:  denies Risk to Others:  denies Prior Inpatient Therapy: denies  Prior Outpatient Therapy:  yes, is follow by Dr. Annye Asa for  psychiatry.   Past Medical History:  Past Medical History:  Diagnosis Date   Diabetes mellitus without complication (HCC)    Hypertension    Immune deficiency disorder (HCC)    Mild mental retardation    Seizure (HCC)     Past Surgical History:  Procedure Laterality Date   IR GASTROSTOMY TUBE MOD SED  02/14/2021   IR GASTROSTOMY TUBE REMOVAL  03/28/2021   Family History:  Family History  Problem Relation Age of Onset   Hypertension Other    Family Psychiatric  History: deferred Social History:  Social History   Substance and Sexual Activity  Alcohol Use No     Social History   Substance and Sexual Activity  Drug Use Never    Social History   Socioeconomic History   Marital status: Single    Spouse name: Not on file   Number of children: Not on file   Years of education: Not on file   Highest education level: Not on file  Occupational History  Not on file  Tobacco Use   Smoking status: Never   Smokeless tobacco: Never  Vaping Use   Vaping status: Never Used  Substance and Sexual Activity   Alcohol use: No   Drug use: Never   Sexual activity: Never  Other Topics Concern   Not on file  Social History Narrative   Lives at home with family       Before seizure she was let handed    Social Determinants of Health   Financial Resource Strain: Not on file  Food Insecurity: Not on file  Transportation Needs: Not on file  Physical Activity: Not on file  Stress: Not on file  Social Connections: Not on file   Additional Social History:    Allergies:   Allergies  Allergen Reactions   Penicillins    Sulfa Antibiotics    Tomato     Labs:  Results for orders placed or performed during the hospital encounter of 03/02/23 (from the past 48 hour(s))  CBC with Differential/Platelet     Status: None   Collection Time: 03/02/23  4:45 PM  Result Value Ref Range   WBC 8.5 4.0 - 10.5 K/uL   RBC 4.31 3.87 - 5.11 MIL/uL   Hemoglobin 12.9 12.0 - 15.0 g/dL    HCT 10.2 72.5 - 36.6 %   MCV 96.3 80.0 - 100.0 fL   MCH 29.9 26.0 - 34.0 pg   MCHC 31.1 30.0 - 36.0 g/dL   RDW 44.0 34.7 - 42.5 %   Platelets 349 150 - 400 K/uL   nRBC 0.0 0.0 - 0.2 %   Neutrophils Relative % 54 %   Neutro Abs 4.7 1.7 - 7.7 K/uL   Lymphocytes Relative 35 %   Lymphs Abs 2.9 0.7 - 4.0 K/uL   Monocytes Relative 8 %   Monocytes Absolute 0.7 0.1 - 1.0 K/uL   Eosinophils Relative 1 %   Eosinophils Absolute 0.1 0.0 - 0.5 K/uL   Basophils Relative 1 %   Basophils Absolute 0.1 0.0 - 0.1 K/uL   Immature Granulocytes 1 %   Abs Immature Granulocytes 0.04 0.00 - 0.07 K/uL    Comment: Performed at [facility], [address], [place], [place] [address]  Comprehensive metabolic panel     Status: Abnormal   Collection Time: 03/02/23  4:45 PM  Result Value Ref Range   Sodium 141 135 - 145 mmol/L   Potassium 4.5 3.5 - 5.1 mmol/L    Comment: HEMOLYSIS AT THIS LEVEL MAY AFFECT RESULT   Chloride 104 98 - 111 mmol/L   CO2 24 22 - 32 mmol/L   Glucose, Bld 129 (H) 70 - 99 mg/dL    Comment: Glucose reference range applies only to samples taken after fasting for at least 8 hours.   BUN 8 6 - 20 mg/dL   Creatinine, Ser 7.56 0.44 - 1.00 mg/dL   Calcium 9.6 8.9 - 43.3 mg/dL   Total Protein 7.7 6.5 - 8.1 g/dL   Albumin 3.8 3.5 - 5.0 g/dL   AST 39 15 - 41 U/L    Comment: HEMOLYSIS AT THIS LEVEL MAY AFFECT RESULT   ALT 26 0 - 44 U/L    Comment: HEMOLYSIS AT THIS LEVEL MAY AFFECT RESULT   Alkaline Phosphatase 70 38 - 126 U/L   Total Bilirubin 0.7 0.3 - 1.2 mg/dL    Comment: HEMOLYSIS AT THIS LEVEL MAY AFFECT RESULT   GFR, Estimated >60 >60 mL/min    Comment: (NOTE)  Calculated using the CKD-EPI Creatinine Equation (2021)    Anion gap 13 5 - 15    Comment: Performed at [facility], [address], [place], [place] [address]  Urinalysis, w/ Reflex to Culture (Infection Suspected) -Urine, Catheterized     Status: Abnormal   Collection Time: 03/02/23  4:45 PM  Result Value  Ref Range   Specimen Source URINE, CATHETERIZED    Color, Urine YELLOW YELLOW   APPearance CLEAR CLEAR   Specific Gravity, Urine 1.025 1.005 - 1.030   pH 6.0 5.0 - 8.0   Glucose, UA 100 (A) NEGATIVE mg/dL   Hgb urine dipstick NEGATIVE NEGATIVE   Bilirubin Urine NEGATIVE NEGATIVE   Ketones, ur NEGATIVE NEGATIVE mg/dL   Protein, ur NEGATIVE NEGATIVE mg/dL   Nitrite NEGATIVE NEGATIVE   Leukocytes,Ua NEGATIVE NEGATIVE   Squamous Epithelial / HPF 0-5 0 - 5 /HPF   WBC, UA 0-5 0 - 5 WBC/hpf    Comment: Reflex urine culture not performed if WBC <=10, OR if Squamous epithelial cells >5. If Squamous epithelial cells >5, suggest recollection.   RBC / HPF 0-5 0 - 5 RBC/hpf   Bacteria, UA RARE (A) NONE SEEN   Uric Acid Crys, UA PRESENT     Comment: Performed at [facility], [address], [place], [place] [address]  Troponin I (High Sensitivity)     Status: None   Collection Time: 03/02/23  4:45 PM  Result Value Ref Range   Troponin I (High Sensitivity) 4 <18 ng/L    Comment: (NOTE) Elevated high sensitivity troponin I (hsTnI) values and significant  changes across serial measurements may suggest ACS but many other  chronic and acute conditions are known to elevate hsTnI results.  Refer to the "Links" section for chest pain algorithms and additional  guidance. Performed at [facility], [address] [place], [place] [address]   Resp panel by RT-PCR (RSV, Flu A&B, Covid) Anterior Nasal Swab     Status: None   Collection Time: 03/02/23  4:45 PM   Specimen: Anterior Nasal Swab  Result Value Ref Range   SARS Coronavirus 2 by RT PCR NEGATIVE NEGATIVE   Influenza A by PCR NEGATIVE NEGATIVE   Influenza B by PCR NEGATIVE NEGATIVE    Comment: (NOTE) The Xpert Xpress SARS-CoV-2/FLU/RSV plus assay is intended as an aid in the diagnosis of influenza from Nasopharyngeal swab specimens and should not be used as a sole basis for treatment. Nasal washings and aspirates are  unacceptable for Xpert Xpress SARS-CoV-2/FLU/RSV testing.  Fact Sheet for Patients: BloggerCourse.com  Fact Sheet for Healthcare Providers: SeriousBroker.it  This test is not yet approved or cleared by the [place] FDA and has been authorized for detection and/or diagnosis of SARS-CoV-2 by FDA under an Emergency Use Authorization (EUA). This EUA will remain in effect (meaning this test can be used) for the duration of the COVID-19 declaration under Section 564(b)(1) of the Act, 21 U.S.C. section 360bbb-3(b)(1), unless the authorization is terminated or revoked.     Resp Syncytial Virus by PCR NEGATIVE NEGATIVE    Comment: (NOTE) Fact Sheet for Patients: BloggerCourse.com  Fact Sheet for Healthcare Providers: SeriousBroker.it  This test is not yet approved or cleared by the [place] FDA and has been authorized for detection and/or diagnosis of SARS-CoV-2 by FDA under an Emergency Use Authorization (EUA). This EUA will remain in effect (meaning this test can be used) for the duration of the COVID-19 declaration under Section  564(b)(1) of the Act, 21 U.S.C. section 360bbb-3(b)(1), unless the authorization is terminated or revoked.  Performed at [facility], [address], [place], [place] [address]   TSH     Status: None   Collection Time: 03/02/23  4:45 PM  Result Value Ref Range   TSH 1.149 0.350 - 4.500 uIU/mL    Comment: Performed by a 3rd Generation assay with a functional sensitivity of <=0.01 uIU/mL. Performed at [facility], [address] [place], [place] [address]   Troponin I (High Sensitivity)     Status: None   Collection Time: 03/02/23 10:19 PM  Result Value Ref Range   Troponin I (High Sensitivity) 4 <18 ng/L    Comment: (NOTE) Elevated high sensitivity troponin I (hsTnI) values and significant  changes across serial measurements may  suggest ACS but many other  chronic and acute conditions are known to elevate hsTnI results.  Refer to the "Links" section for chest pain algorithms and additional  guidance. Performed at [facility], [address], [place], [place] [address]   CBG monitoring, ED     Status: Abnormal   Collection Time: 03/03/23 11:11 AM  Result Value Ref Range   Glucose-Capillary 220 (H) 70 - 99 mg/dL    Comment: Glucose reference range applies only to samples taken after fasting for at least 8 hours.  CBG monitoring, ED     Status: Abnormal   Collection Time: 03/03/23  3:53 PM  Result Value Ref Range   Glucose-Capillary 188 (H) 70 - 99 mg/dL    Comment: Glucose reference range applies only to samples taken after fasting for at least 8 hours.    Medications:  Current Facility-Administered Medications  Medication Dose Route Frequency Provider Last Rate Last Admin   acetaminophen (TYLENOL) tablet 650 mg  650 mg Oral Q6H PRN Plunkett, Whitney, MD       glimepiride (AMARYL) tablet 2 mg  2 mg Oral QAC breakfast Anitra Lauth, Whitney, MD   2 mg at 03/03/23 0900   insulin aspart (novoLOG) injection 0-9 Units  0-9 Units Subcutaneous Q4H Plunkett, Alphonzo Lemmings, MD   2 Units at 03/03/23 1559   levETIRAcetam (KEPPRA) tablet 1,250 mg  1,250 mg Oral BID Gwyneth Sprout, MD   1,250 mg at 03/03/23 1057   metFORMIN (GLUCOPHAGE) tablet 500 mg  500 mg Oral BID WC Gwyneth Sprout, MD   500 mg at 03/03/23 0900   metoprolol succinate (TOPROL-XL) 24 hr tablet 12.5 mg  12.5 mg Oral Daily Gwyneth Sprout, MD   12.5 mg at 03/03/23 1057   Current Outpatient Medications  Medication Sig Dispense Refill   acetaminophen (TYLENOL) 325 MG tablet Take 2 tablets (650 mg total) by mouth every 6 (six) hours as needed for mild pain (or Fever >/= 101).     cephALEXin (KEFLEX) 500 MG capsule Take 1 capsule (500 mg total) by mouth 4 (four) times daily. 20 capsule 0   glimepiride (AMARYL) 2 MG tablet Take 2 mg by mouth daily before  breakfast.     insulin aspart (NOVOLOG) 100 UNIT/ML injection Inject 1-7 Units into the skin 3 (three) times daily as needed for high blood sugar (Per sliding scale: 120-150=1 unit, 150-200=2 units, 201-250=4 units, 251-300=6 units).     insulin aspart (NOVOLOG) 100 UNIT/ML injection Inject 8 Units into the skin 3 (three) times daily with meals. (Patient taking differently: Inject 14 Units into the skin 3 (three) times daily with meals.) 10 mL 11   insulin  glargine (LANTUS) 100 unit/mL SOPN Inject 30 Units into the skin at bedtime. (Patient taking differently: Inject 38 Units into the skin at bedtime.) 15 mL 11   levETIRAcetam (KEPPRA) 1000 MG tablet Take 1,000 mg by mouth 2 (two) times daily.     loratadine (CLARITIN) 10 MG tablet Take 10 mg by mouth daily.     LORazepam (ATIVAN) 0.5 MG tablet Take 1 tablet (0.5 mg total) by mouth daily before breakfast. 30 tablet 0   metFORMIN (GLUCOPHAGE) 500 MG tablet Take 500 mg by mouth 2 (two) times daily with a meal.     metoprolol succinate (TOPROL-XL) 25 MG 24 hr tablet Take 0.5 tablets (12.5 mg total) by mouth daily. 30 tablet 2   Multiple Vitamin (MULTIVITAMIN WITH MINERALS) TABS tablet Take 1 tablet by mouth daily.     polyethylene glycol (MIRALAX / GLYCOLAX) 17 g packet Take 17 g by mouth 2 (two) times daily. 14 each 0   risperiDONE (RISPERDAL) 2 MG tablet Take 1 tablet (2 mg total) by mouth at bedtime. 30 tablet 0   senna-docusate (SENOKOT-S) 8.6-50 MG tablet Take 1 tablet by mouth 2 (two) times daily. 60 60 tablet 6   traZODone (DESYREL) 100 MG tablet Take 1 tablet (100 mg total) by mouth at bedtime as needed for sleep. 30 tablet 0   valACYclovir (VALTREX) 500 MG tablet Take 500 mg by mouth at bedtime.      Musculoskeletal: pt seen independently ambulating in the room; she moves all extremities indepdenlty.  Strength & Muscle Tone: within normal limits Gait & Station: normal Patient leans: N/A     Psychiatric Specialty Exam:  Presentation   General Appearance: Casual; Appropriate for Environment  Eye Contact:Good  Speech:Clear and Coherent  Speech Volume:Normal  Handedness:Right   Mood and Affect  Mood:Euthymic  Affect:Appropriate; Congruent   Thought Process  Thought Processes:Goal Directed; Linear  Descriptions of Associations:Intact  Orientation:Partial (patient oriented to person, place and events that led to hospitalization but unsure of time)  Thought Content:Logical  History of Schizophrenia/Schizoaffective disorder:No data recorded Duration of Psychotic Symptoms:No data recorded Hallucinations:Hallucinations: None  Ideas of Reference:None  Suicidal Thoughts:Suicidal Thoughts: No  Homicidal Thoughts:Homicidal Thoughts: No   Sensorium  Memory:Immediate Good; Recent Fair; Remote Fair  Judgment:Good  Insight:Fair   Executive Functions  Concentration:Good  Attention Span:Good  Recall:Fair  Fund of Knowledge:Good  Language:Good   Psychomotor Activity  Psychomotor Activity:Psychomotor Activity: Normal   Assets  Assets:Communication Skills; Financial Resources/Insurance; Social Support   Sleep  Sleep:Sleep: Good Number of Hours of Sleep: 6    Physical Exam: Physical Exam Constitutional:      Appearance: She is obese.  Cardiovascular:     Rate and Rhythm: Normal rate.  Pulmonary:     Effort: Pulmonary effort is normal.  Musculoskeletal:        General: Normal range of motion.     Cervical back: Normal range of motion.  Neurological:     Mental Status: She is alert.  Psychiatric:        Mood and Affect: Mood normal.        Behavior: Behavior normal.        Thought Content: Thought content normal.        Judgment: Judgment normal.    Review of Systems  Constitutional: Negative.   HENT: Negative.    Eyes: Negative.   Respiratory: Negative.    Cardiovascular: Negative.   Gastrointestinal: Negative.   Genitourinary: Negative.   Musculoskeletal: Negative.    Skin:  Negative.   Neurological: Negative.   Endo/Heme/Allergies: Negative.   Psychiatric/Behavioral: Negative.     Blood pressure 134/66, pulse 100, temperature 98.3 F (36.8 C), temperature source Oral, resp. rate 18, height 5\' 2"  (1.575 m), weight 82 kg, SpO2 97%. Body mass index is 33.06 kg/m.  Treatment Plan Summary: Patient presents to the emergency department for evaluation of altered mental status and symptoms suggestive of catatonia.  Patient has hx for seizures, and was evaluated in the ED 4 days prior for seizure. Patient's symptoms were initially thought to be postictal but d/t significant mental status decline, medical wanted to rule out infectious disease or electrolyte abnormalities.  Considering her EKG, CMP, CBC, TSH,U/A were within normal limits and her head CT was negative, neurology felt her concerns may be related to her mental health diagnosis and a psychiatry referral was entered.  Prior to psychiatric evaluation patient was restarted on home medications and given lorazepam 1mg  via injection for catatonic symptoms.    On assessment today, patient is alert and oriented to person, place and events that led to currently hospitalization; she is unsure of today's date. She engages in spontaneous conversation and even makes jokes about her lunch referring to her meat at "mystery meat." She denies SI/HI, has some intrusive thoughts, negative self talk, but this appears to be her baseline, and she verbalizes the ability to use coping skills to deal with it. Otherwise, her thoughts are devoid of psychosis, paranoia or delusions. Although she has a dx for bipolar disorder, she is compliant with psychiatric medications and is engaged is outpatient mental health services. She does not appear mentally decompensated, catatonic and cannot see where she would benefit from psych admission today.  Considering above, patient is psych cleared. There are no psychotropic medication changes recommended  today.   Spoke with her sister, [name], who is also her guardian.  Ms. Tristan Schroeder was updated to above. She provides collateral that she does not believe her sister's concerns are related to her mental health. She also expressed her belief this is patient's usual course s/p seizure.  She agrees with the plan for psychiatric clearance,  and states patient has outpatient psychiatry follow up with Dr. Jacqulyn Liner in October. She did not have additional question or concerns from a psychiatric standing.  Ms. Tristan Schroeder had some questions regarding her seizures which were deferred to ED attending,Dr.Dixon.   Disposition: No evidence of imminent risk to self or others at present.   Patient does not meet criteria for psychiatric inpatient admission. Supportive therapy provided about ongoing stressors. Discussed crisis plan, support from social network, calling 911, coming to the Emergency Department, and calling Suicide Hotline.  Dr. Gloris Manchester, ED Provider; Grant Fontana, RN were all informed of above recommendation and disposition via secure chat @[phone] .   This service was provided via telemedicine using a 2-way, interactive audio and video technology.  Names of all persons participating in this telemedicine service and their role in this encounter. Name: Laura Balbuena Role: Patient  Name: [name] Role: Patient's sister/guardian  Name: Ophelia Shoulder Role: PMHNP  Name: Dr. Marti Sleigh Role: Psychiatrist    Chales Abrahams, NP 03/03/2023 5:26 PM

## 2023-03-03 NOTE — ED Notes (Signed)
CBG monitor reading 159.

## 2023-03-03 NOTE — ED Provider Notes (Addendum)
Emergency Medicine Observation Re-evaluation Note  [name] is a 57 y.o. female, seen on rounds today.  Pt initially presented to the ED for complaints of Seizures Currently, the patient is sitting in bed, eating.  Physical Exam  BP 110/66   Pulse 74   Temp 97.9 F (36.6 C) (Oral)   Resp 13   Ht 5\' 2"  (1.575 m)   Wt 82 kg   SpO2 100%   BMI 33.06 kg/m  Physical Exam General: Awake, alert, nondistressed Cardiac: Normal heart rate Lungs: Breathing is unlabored Psych: No agitation  ED Course / MDM  EKG:EKG Interpretation Date/Time:  Friday March 02 2023 17:02:06 EDT Ventricular Rate:  82 PR Interval:  176 QRS Duration:  94 QT Interval:  379 QTC Calculation: 443 R Axis:   -28  Text Interpretation: Sinus rhythm Prominent P waves, nondiagnostic Borderline left axis deviation Low voltage, precordial leads Artifact No significant change since last tracing Confirmed by Gwyneth Sprout ([id number]) on 03/02/2023 5:36:50 PM  I have reviewed the labs performed to date as well as medications administered while in observation.  Recent changes in the last 24 hours include presentation to the emergency department yesterday for concern of seizure.  Workup was unremarkable.  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  She did not respond to Ativan.  TTS was consulted.  Currently, she is sitting up in bed eating.  She states that she feels fine and has no physical complaints.  TTS evaluated and cleared patient from psychiatric perspective.  I spoke with her sister over the telephone.  I informed her of patient doing well today as well as recommendations from neurology and psychiatry.  Patient was advised to obtain close neurology follow-up.  Home Keppra dose was increased to 1250 mg twice daily.  Sister did request home health support.  Face-to-face orders were placed.  Patient continued to deny any complaints.   Sister to pick up this evening.  Plan  Current plan is for discharge.    Gloris Manchester, MD 03/03/23 1238    Gloris Manchester, MD 03/03/23 [phone]

## 2023-03-03 NOTE — ED Notes (Signed)
Helped patient back in the bed a bed alarm was placed patient has call bell in reach got patient some warm blankets

## 2023-03-03 NOTE — BH Assessment (Signed)
TTS attempted to assess pt. Marissa Maduro, RN informed this clinician that pt is too somnolent  for an evaluation. TTS will be informed when pt is alert and awake for an assessment.

## 2023-03-03 NOTE — ED Notes (Signed)
Checked patient blood sugar it was 220 notified RN of blood sugar

## 2023-03-03 NOTE — Consult Note (Signed)
Attempted to see patient for psychiatry assessment via tts cart.  Per April Vitiello, RN caring for patient, she is asleep and too tired to participate in assessment.  She will alert this Clinical research associate when patient is available for assessment.

## 2023-03-03 NOTE — ED Notes (Signed)
CBG 214

## 2023-03-05 ENCOUNTER — Telehealth: Payer: Self-pay | Admitting: Neurology

## 2023-03-05 LAB — HEMOGLOBIN A1C
Hgb A1c MFr Bld: 6 % — ABNORMAL HIGH (ref 4.8–5.6)
Mean Plasma Glucose: 126 mg/dL

## 2023-03-05 LAB — CBG MONITORING, ED
Glucose-Capillary: 106 mg/dL — ABNORMAL HIGH (ref 70–99)
Glucose-Capillary: 159 mg/dL — ABNORMAL HIGH (ref 70–99)
Glucose-Capillary: 214 mg/dL — ABNORMAL HIGH (ref 70–99)

## 2023-03-05 NOTE — Telephone Encounter (Signed)
Caller stated pt has had two hospital visits over the weekend and wants to know if she can get sooner appointment as soon as possible.

## 2023-03-06 NOTE — Telephone Encounter (Signed)
Scheduled for 9/24.

## 2023-03-10 IMAGING — DX DG ABDOMEN 1V
1 series · 2 of 2 positions shown · non-contrast
Comparison: None.

CLINICAL DATA: Constipation

EXAM:
ABDOMEN - 1 VIEW

[Series 1: abdomen · 0.14mm/px · 2 of 2 slices shown]
[im 1/2]
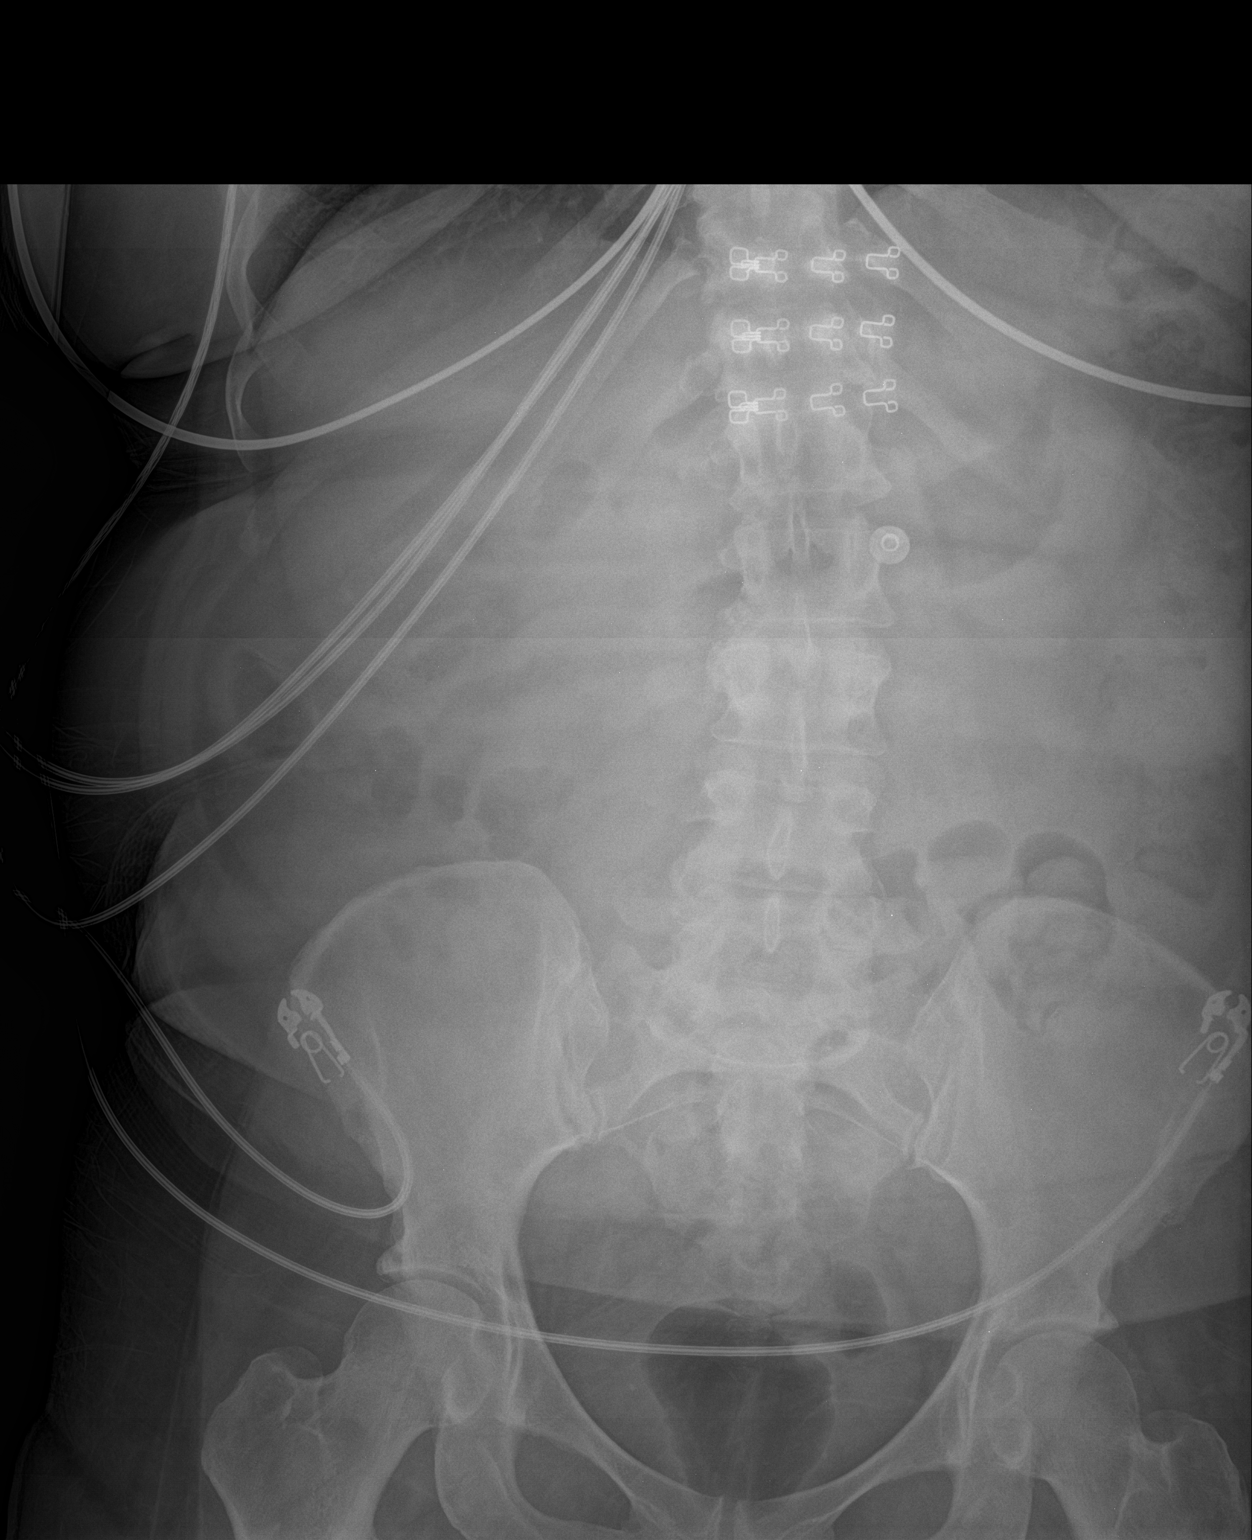
[im 2/2]
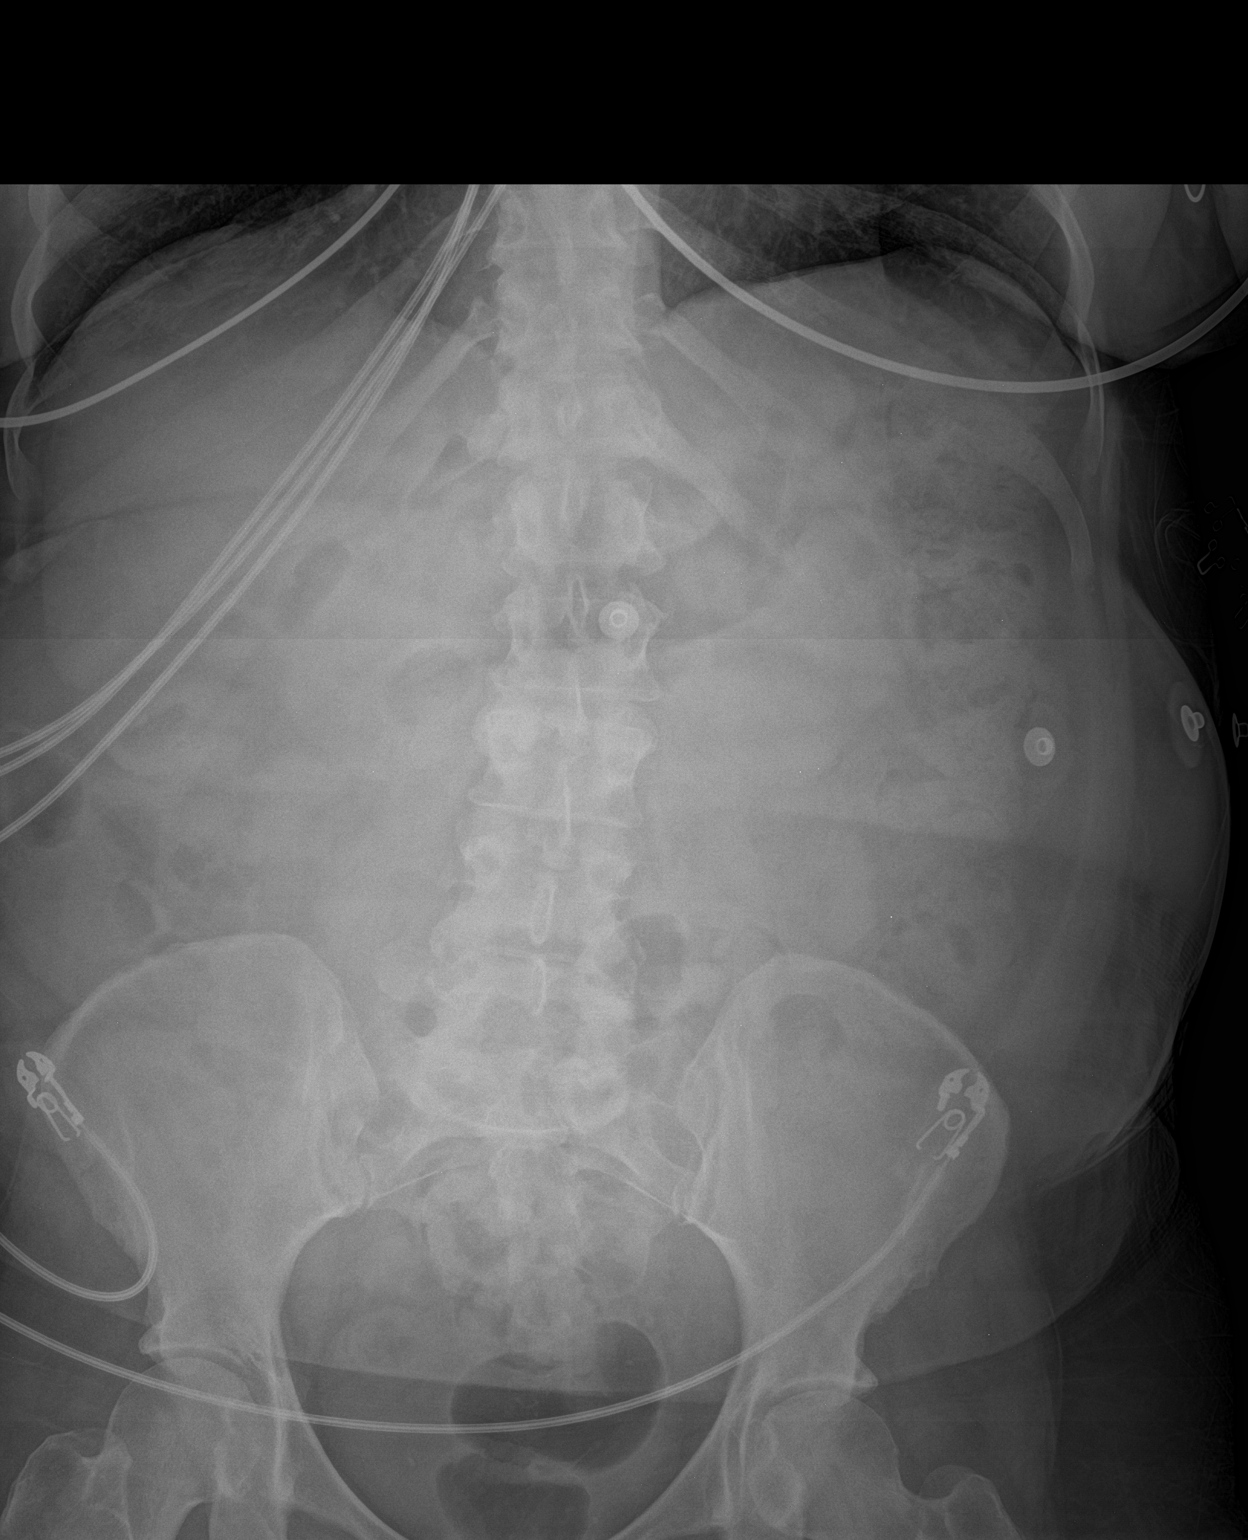

[2 of 2 positions shown; findings below may reference images not displayed]

FINDINGS: Moderate stool burden within the colon. There is a non obstructive
bowel gas pattern. No supine evidence of free air. No organomegaly
or suspicious calcification. No acute bony abnormality.
IMPRESSION: Moderate stool burden.  No acute findings.

## 2023-03-13 ENCOUNTER — Encounter: Payer: Self-pay | Admitting: Neurology

## 2023-03-13 ENCOUNTER — Ambulatory Visit: Payer: MEDICAID | Admitting: Neurology

## 2023-03-13 VITALS — BP 119/77 | HR 88 | Ht 63.0 in | Wt 180.2 lb

## 2023-03-13 DIAGNOSIS — G40009 Localization-related (focal) (partial) idiopathic epilepsy and epileptic syndromes with seizures of localized onset, not intractable, without status epilepticus: Secondary | ICD-10-CM

## 2023-03-13 MED ORDER — DIVALPROEX SODIUM 125 MG PO CSDR: DELAYED_RELEASE_CAPSULE | ORAL | 3 refills | Status: DC

## 2023-03-13 MED ORDER — LEVETIRACETAM 1000 MG PO TABS: 1000.0000 mg | ORAL_TABLET | Freq: Two times a day (BID) | ORAL | 3 refills | Status: DC

## 2023-03-13 NOTE — Patient Instructions (Signed)
Good to see you.   Start Depakote sprinkles 125mg : Take 1 capsule twice a day  2. Go back to Levetiracetam (Keppra) 1000mg : Take 1 tablet twice a day  3. Restart Risperdal, continue follow-up with Jerolyn Shin  4. Follow-up in 3 months, call for any changes   Seizure Precautions: 1. If medication has been prescribed for you to prevent seizures, take it exactly as directed.  Do not stop taking the medicine without talking to your doctor first, even if you have not had a seizure in a long time.   2. Avoid activities in which a seizure would cause danger to yourself or to others.  Don't operate dangerous machinery, swim alone, or climb in high or dangerous places, such as on ladders, roofs, or girders.  Do not drive unless your doctor says you may.  3. If you have any warning that you may have a seizure, lay down in a safe place where you can't hurt yourself.    4.  No driving for 6 months from last seizure, as per [facility].   Please refer to the following link on the [employer]ile[employer] of [employer]'s website for more information: http://www.epilepsyfoundation.org/answerplace/Social/driving/drivingu.cfm   5.  Maintain good sleep hygiene.   6.  Contact your doctor if you have any problems that may be related to  the medicine you are taking.  7.  Call 911 and bring the patient back to the ED if:        A.  The seizure lasts longer than 5 minutes.       B.  The patient doesn't awaken shortly after the seizure  C.  The patient has new problems such as difficulty seeing, speaking or moving  D.  The patient was injured during the seizure  E.  The patient has a temperature over 102 F (39C)  F.  The patient vomited and now is having trouble breathing

## 2023-03-13 NOTE — Progress Notes (Signed)
NEUROLOGY FOLLOW UP OFFICE NOTE  [name] [id number] 10-Apr-1966  HISTORY OF PRESENT ILLNESS: I had the pleasure of seeing [name] in follow-up in the neurology clinic on 03/13/2023.  The patient was last seen 6 months ago for seizures. She is again accompanied by her sister [name] who helps supplement the history today.  Records and images were personally reviewed where available.  On her initial visit in 08/2022, she had a very flat affect with psychomotor slowing, mutism, able to follow commands. We did a 28-hour ambulatory EEG in 08/2022 which was normal. Her sister reported that [name] was back to baseline. I had spoken to her psychiatry provider of 20 years, [name] with [facility] stating she had always been on Depakote for mood and Risperdal, and that [name] had overall been very stable with no prior history of catatonia. Concern was raised about medication compliance living with her sister. She was in the ER on 02/26/23 for weakness. She had been at [facility] where she is normally bright and bubbly but that day she appeared weaker and reported she had not been using her insulin. Glucose with EMS was normal. She was treated for a UTI. [name] called our office on 03/01/23 that she was having memory loss and urinary incontinence, no longer able to function with ADLs again. EMS was called on 03/02/23 after a seizure lasting 10-15 minutes. In the ER, she was non-verbal (similar to prior ER visit in 08/2022). Brain MRI without contrast normal. Levetiracetam increased to 1250mg  BID. Overnight she improved, Psych evaluation on 9/14 indicated she was speaking in a clear tone with normal pace and good eye contact, thought process goal-oriented and linear.  [name] reports that she had a seizure on 9/9, she was staring then the left arm flexed and started jerking, she was bent over vocalizing drooling. She did not fall. [name] reports she brought her to the ER that day, but ER notes only  indicate weakness. [name] reports she was discharged home but she was not at baseline, she was not talking again similar to 08/2022 events. [name] reports she was having seizures through the night, "not here" with left arm jerking, so she brought her back to the ER on 9/13. She only started the Levetiracetam 1250mg  BID last weekend. [name] crushes her medications and monitors she takes them, denies missing any doses. She has had progressive improvement since ER visit. She presents today smiling, able to answer questions with note of dysarthria. [name] reports she is much better but still not back to her full self. Voice is shaky. She is better in the mornings, then has decreased verbal output in the evenings, where [name] helps with her pajamas and moving her legs. Yesterday it took her over an hour to eat her oatmeal, she did well this morning. She needs help with her shirt and bra but can put on her socks and pants. [name] reports hospital said to stop the Risperdal so she has not been giving it for several days, however on review of ER AVS, there is no mention of stopping Risperdal.    History on Initial Assessment 08/29/2022: This is a 57 year old right-handed woman with a history of mild intellectual disability, hypertension, diabetes, presenting for evaluation of seizures. Her sister [name] provides the history as patient is non-verbal in the office today. Records were also reviewed and will be summarized as follows. She had been living in a group home for 12 years up until 2021 when the  group home closed and [name] took her in. She was in the hospital in May 2022 for seizures. At that time, [name] was unaware of a history of seizures (but states that she found out later on from another group home resident that she used to have seizures). She had been on Depakote for many years due to behavioral issues. She had stopped this along with several other medications in August 2021. Her sister started noticing intermittent  staring/behavioral arrest episodes lasting a few seconds to 2 minutes. Per notes, "over the past day, she has not had much speech output at all." She had intermittent twitching of the right side of her face, then had a focal seizure witnessed by the ER physician. Overnight EEG in 10/2020 was normal. MRI brain no acute changes. She was discharged on Levetiracetam 1000mg  BID. She was in the ER in August 2022 for decreased oral intake and "saying bizarre things." She was not on Levetiracetam on admission. MRI brain and EEG were normal. She was admitted for 2 months. She was seen by Psychiatry and diagnosed with catatonia, treated with Ativan with significant improvement in symptoms. After it was tapered and discontinued, catatonia recurred and once again improved after she was placed on low dose schedule lorazepam 0.5mg  daily. Levetiracetam 1000mg  BID restarted. [name] reports that she did well for 2 years, at baseline she is verbal, eats three meals a day, makes her lunch, brushes her teeth and showers, reads. She is able to get her coat and pocketbook when [facility] comes to bring her to her day program at [facility]. On 08/20/22, she had a change in mental status, drooling, very flat affect, unresponsive to questions. She was noted to be stiff, and in the ER she had waxy flexibility (when extremities moved, they stayed in the position she was posed in). MRI brain no acute changes. She was given Ativan and Levetiracetam increased to 1250mg  BID. She was back in the ER on 08/22/22 after a witnessed tonic-clonic seizure lasting 20 seconds at home. When EMS evaluated her medications, Levetiracetam was not one of them, but that sister gave her 250mg  from recent ER visit. Last time 1000mg  tablet was filled was in March 2023. She had another seizure with right-sided facial twitching lasting less than 20 seconds. On ER discharge, she was responding quicker to questions, more interactive and smiling, feeding herself. [name] reports  she was still non-verbal and has remained non-verbal since. She is eating and feeds herself but holds food in the mouth the past 2 nights. She used to put her own medications in a cup but now [name] has to use a straw and mixes her medications in pudding. Sleep is good. She can follow instructions and laughs at videos but otherwise has no other verbal output.   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 She does jobs with her program. [name] denies any history of febrile seizures, CNS infections, neurosurgical procedures, family history of seizures.   Diagnostic Data: Brain MRI without contrast 08/2022 normal Brain MRI without contrast 02/2023 normal  Overnight EEG in 10/2020 was normal.  Routine EEG x 2 in 01/2021 normal Routine EEG 02/2021 normal Routine EEG 08/2022 normal 28-hour ambulatory EEG normal, patient was nonverbal the day prior but gradually improving during EEG, feeding herself   PAST MEDICAL HISTORY: Past Medical History:  Diagnosis Date   Diabetes mellitus without complication (  HCC)    Hypertension    Immune deficiency disorder (HCC)    Mild mental retardation    Seizure (HCC)     MEDICATIONS: Current Outpatient Medications on File Prior to Visit  Medication Sig Dispense Refill   acetaminophen (TYLENOL) 325 MG tablet Take 2 tablets (650 mg total) by mouth every 6 (six) hours as needed for mild pain (or Fever >/= 101).     cephALEXin (KEFLEX) 500 MG capsule Take 1 capsule (500 mg total) by mouth 4 (four) times daily. (Patient not taking: Reported on 03/03/2023) 20 capsule 0   glimepiride (AMARYL) 2 MG tablet Take 2 mg by mouth daily before breakfast.     insulin aspart (NOVOLOG) 100 UNIT/ML injection Inject 1-7 Units into the skin 3 (three) times daily as needed for high blood sugar (Per sliding scale: 120-150=1 unit, 150-200=2 units, 201-250=4 units, 251-300=6  units).     insulin aspart (NOVOLOG) 100 UNIT/ML injection Inject 8 Units into the skin 3 (three) times daily with meals. (Patient taking differently: Inject 14 Units into the skin 3 (three) times daily with meals.) 10 mL 11   insulin glargine (LANTUS) 100 unit/mL SOPN Inject 30 Units into the skin at bedtime. (Patient taking differently: Inject 38 Units into the skin at bedtime.) 15 mL 11   levETIRAcetam (KEPPRA) 250 MG tablet Take 5 tablets (1,250 mg total) by mouth 2 (two) times daily. 300 tablet 2   loratadine (CLARITIN) 10 MG tablet Take 10 mg by mouth daily.     LORazepam (ATIVAN) 0.5 MG tablet Take 1 tablet (0.5 mg total) by mouth daily before breakfast. 30 tablet 0   metFORMIN (GLUCOPHAGE) 500 MG tablet Take 500 mg by mouth 2 (two) times daily with a meal.     metoprolol succinate (TOPROL-XL) 25 MG 24 hr tablet Take 0.5 tablets (12.5 mg total) by mouth daily. 30 tablet 2   risperiDONE (RISPERDAL) 2 MG tablet Take 1 tablet (2 mg total) by mouth at bedtime. 30 tablet 0   valACYclovir (VALTREX) 500 MG tablet Take 500 mg by mouth at bedtime.     No current facility-administered medications on file prior to visit.    ALLERGIES: Allergies  Allergen Reactions   Penicillins    Sulfa Antibiotics    Tomato     FAMILY HISTORY: Family History  Problem Relation Age of Onset   Hypertension Other     SOCIAL HISTORY: Social History   Socioeconomic History   Marital status: Single    Spouse name: Not on file   Number of children: Not on file   Years of education: Not on file   Highest education level: Not on file  Occupational History   Not on file  Tobacco Use   Smoking status: Never   Smokeless tobacco: Never  Vaping Use   Vaping status: Never Used  Substance and Sexual Activity   Alcohol use: No   Drug use: Never   Sexual activity: Never  Other Topics Concern   Not on file  Social History Narrative   Lives at home with family       Before seizure she was let handed     Social Determinants of Health   Financial Resource Strain: Not on file  Food Insecurity: Not on file  Transportation Needs: Not on file  Physical Activity: Not on file  Stress: Not on file  Social Connections: Not on file  Intimate Partner Violence: Not on file     PHYSICAL EXAM: Vitals:  03/13/23 1244  BP: 119/77  Pulse: 88  SpO2: 98%   General: No acute distress, able to say names of multiple siblings, laughing Head:  Normocephalic/atraumatic Skin/Extremities: No rash, no edema Neurological Exam: alert and awake. Mild dysarthria. Fund of knowledge is reduced. Attention and concentration are normal. Able to name, mild difficulty with repetition. Cranial nerves: Pupils equal, round. Extraocular movements intact with no nystagmus. Visual fields full.  No facial asymmetry.  Motor: +cogwheeling bilaterally, muscle strength 5/5 throughout with no pronator drift.   Finger to nose testing intact.  Gait slow and cautious, no ataxia.    IMPRESSION: This is a 57 yo RH woman with a history of mild intellectual disability, hypertension, diabetes, with seizures. She has had episodes of staring/behavioral arrest, right facial twitching, and a GTC last 08/22/22. After this seizure, she had mutism with very flat affect/psychomotor slowing, ambulatory EEG did not show any electrographic seizures. She had another bout of seizures earlier this month again followed by mutism. Although there is a possibility this is psychiatric in nature, they do appear to occur after her seizures. She was previously on Depakote for mood stabilization, we discussed restarting Depakote for seizure prophylaxis and mood stabilization, restart Depakote 125mg  BID (sister can open capsules into food), go back to Levetiracetam 1000mg  BID (sister crushes medication). Sister manages medications. We discussed that if seizures recur, would maximize Depakote (instead of increasing Levetiracetam). Continue follow-up with Psychiatry,  advised to restart Risperdal. 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 Continue close supervision. Follow-up in 3 months, call for any changes.    Thank you for allowing me to participate in her care.  Please do not hesitate to call for any questions or concerns.   Patrcia Dolly, M.D.   CC: Derenda Mis, FNP

## 2023-04-13 IMAGING — CT CT HEAD W/O CM
4 series · 16 of 47 positions shown, 18 images · non-contrast
Comparison: Brain MRI 01/24/2021.  Head CT 02/15/2021.

CLINICAL DATA: 55-year-old female with unexplained altered mental
status.

EXAM:
CT HEAD WITHOUT CONTRAST
TECHNIQUE: Contiguous axial images were obtained from the base of the skull
through the vertex without intravenous contrast.

[Series 3: head without · axial · non-contrast · 0.42mm/px · z∈[-209,-79]mm · 7 of 36 slices shown, 9 images]
[im 5/36  brain]
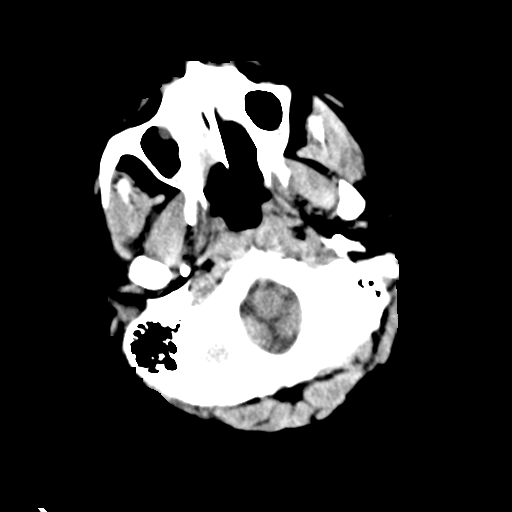
[im 5/36  bone]
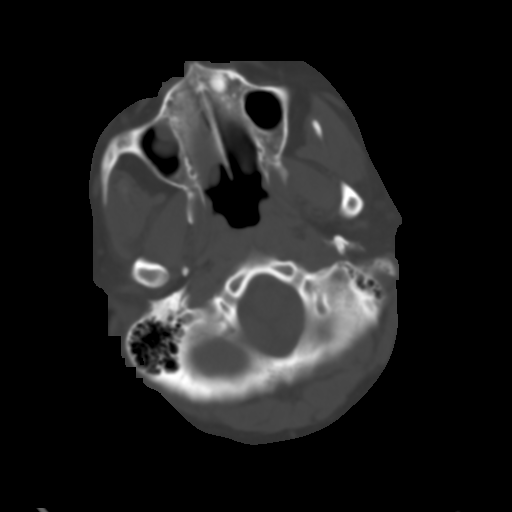
[im 9/36  brain]
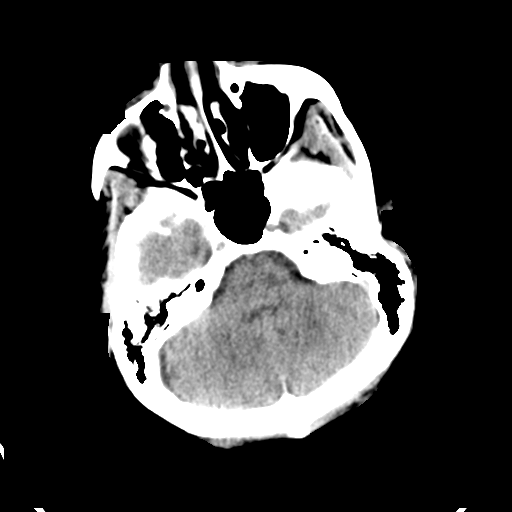
[im 14/36  brain]
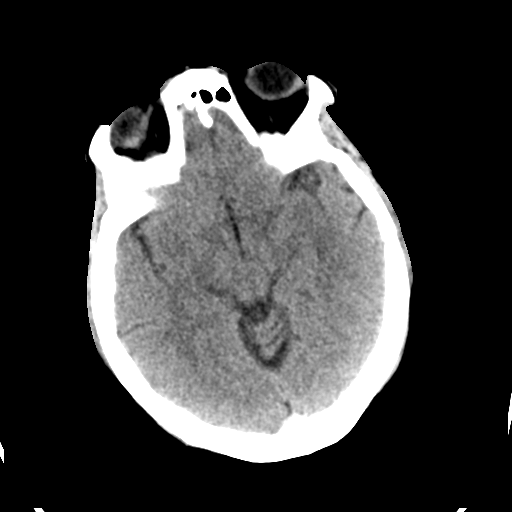
[im 18/36  brain]
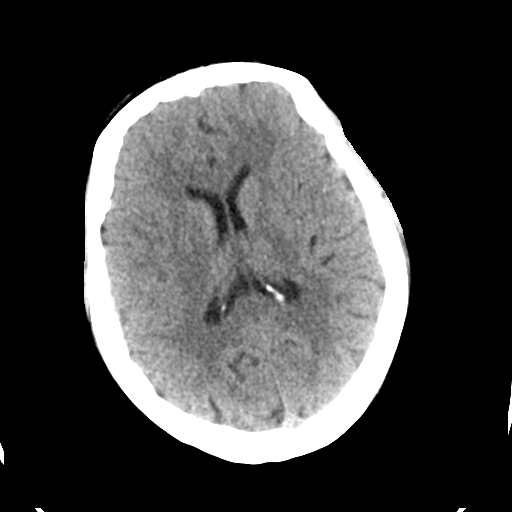
[im 22/36  brain]
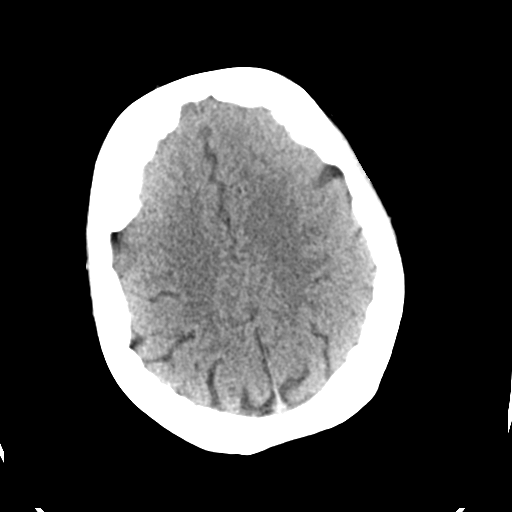
[im 22/36  bone]
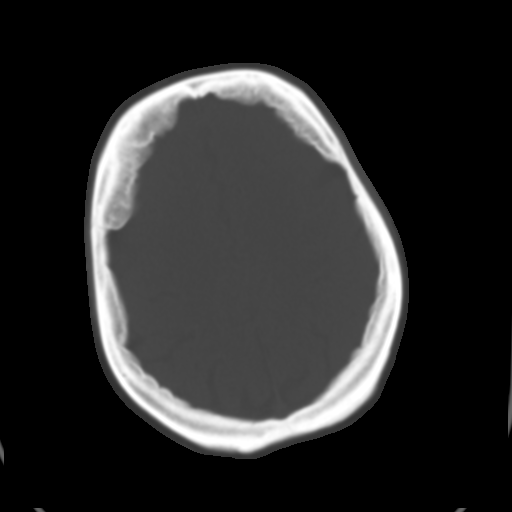
[im 27/36  brain]
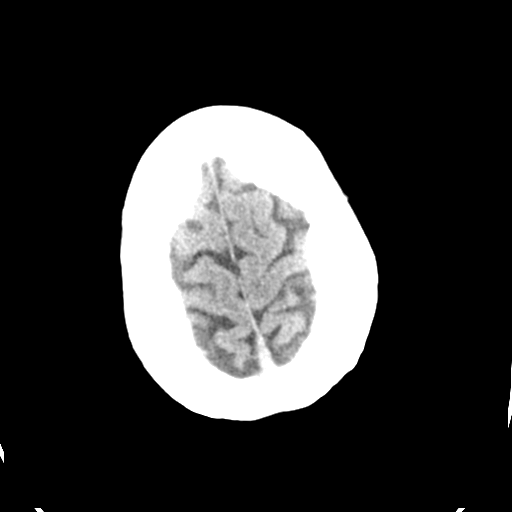
[im 31/36  brain]
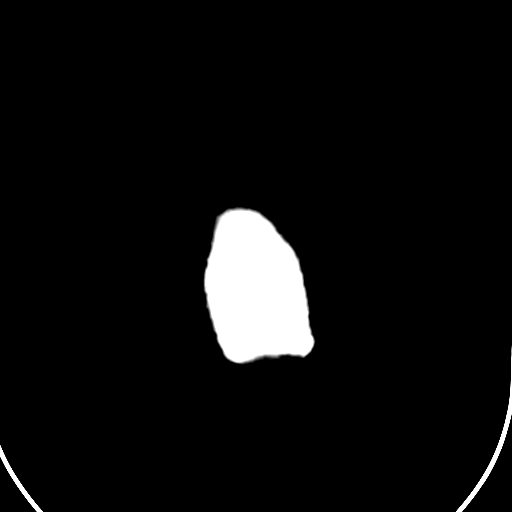

[Series 4: head bone · axial · 0.42mm/px · z∈[-213,-177]mm · 3 of 88 slices shown]
[im 9/88  bone]
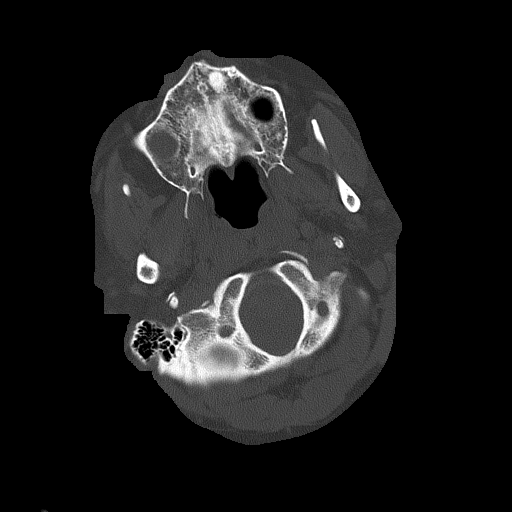
[im 18/88  bone]
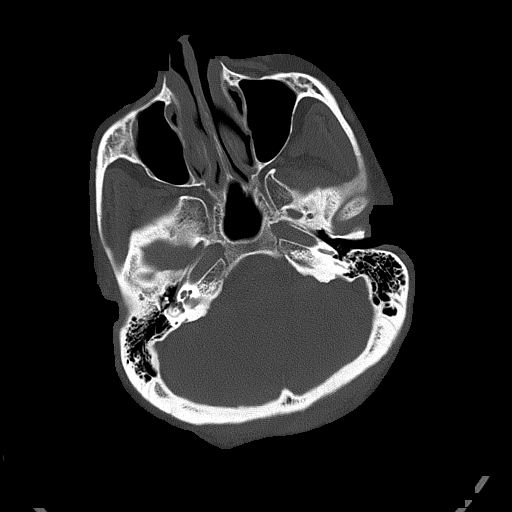
[im 27/88  bone]
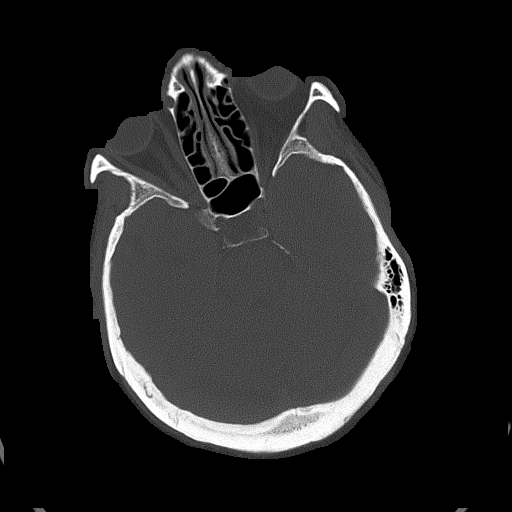

[Series 5: head without cor · coronal · non-contrast · 0.29mm/px · 3 of 67 slices shown]
[im 25/67  brain]
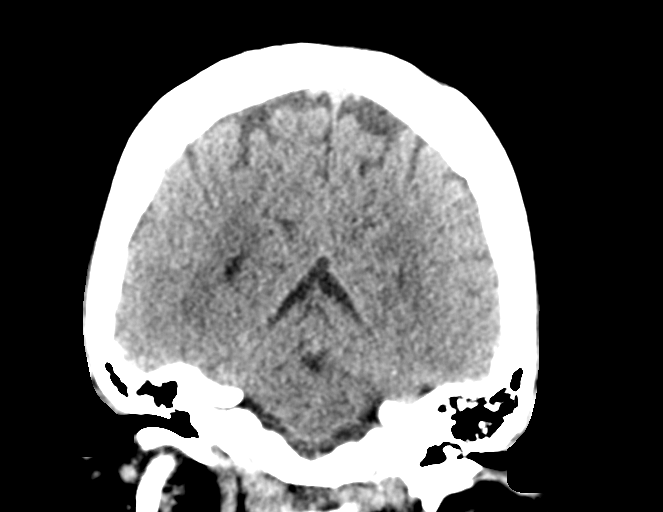
[im 31/67  brain]
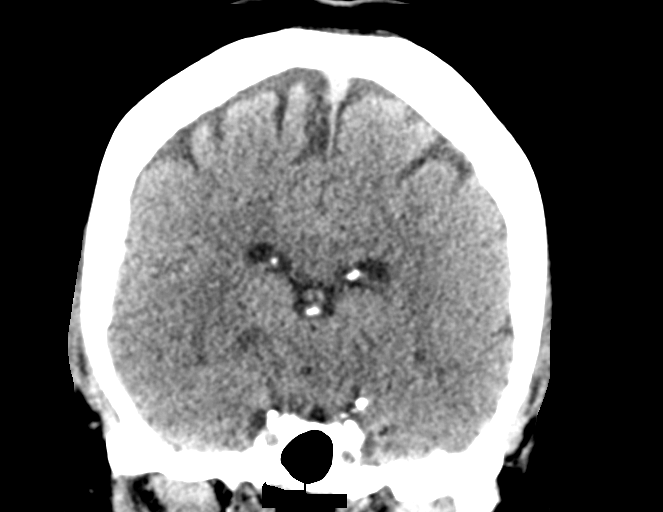
[im 36/67  brain]
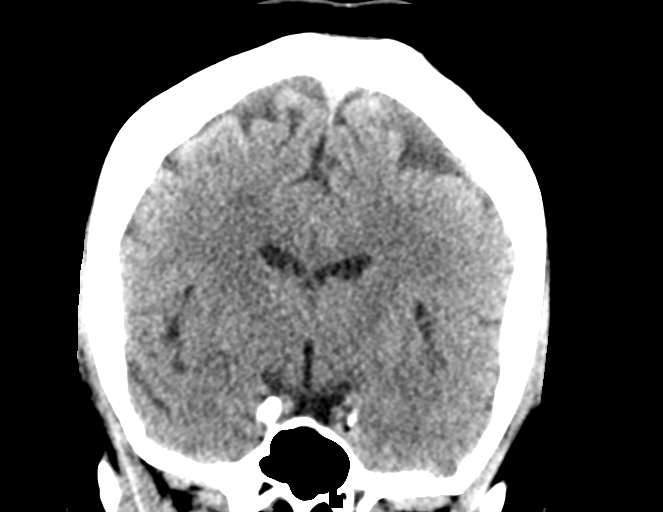

[Series 6: head without sag · sagittal · non-contrast · 0.34mm/px · 3 of 58 slices shown]
[im 20/58  brain]
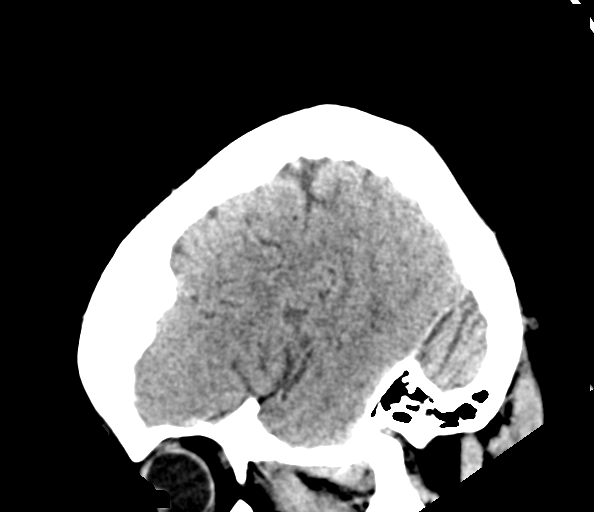
[im 29/58  brain]
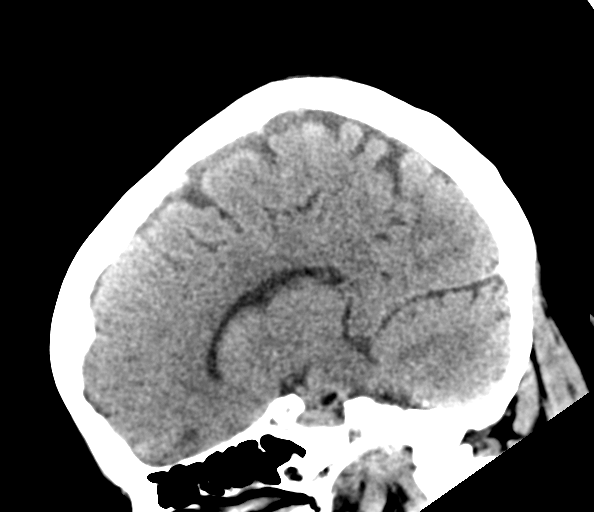
[im 39/58  brain]
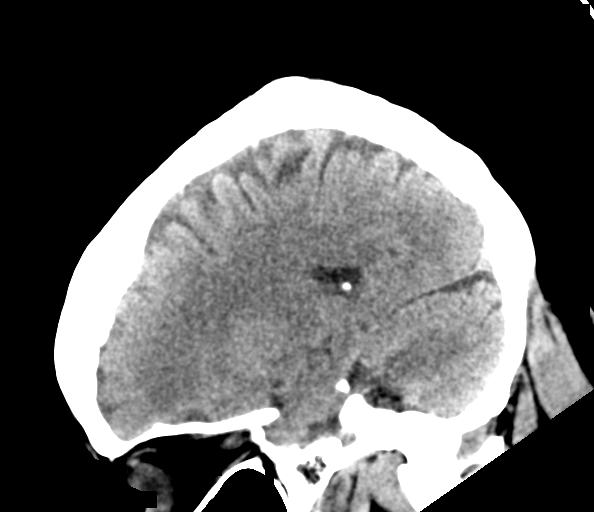

[16 of 47 positions shown; findings below may reference images not displayed]

FINDINGS: Brain: Normal cerebral volume. No midline shift, ventriculomegaly,
mass effect, evidence of mass lesion, intracranial hemorrhage or
evidence of cortically based acute infarction. Gray-white matter
differentiation is within normal limits throughout the brain.
Partially empty sella.

Vascular: No suspicious intracranial vascular hyperdensity.

Skull: Stable hyperostosis.

Sinuses/Orbits: Visualized paranasal sinuses and mastoids are stable
and well aerated. Small right maxillary mucous retention cysts.

Other: Visualized orbits and scalp soft tissues are within normal
limits.
IMPRESSION: Stable and negative noncontrast head CT.

## 2023-06-08 ENCOUNTER — Encounter: Payer: Self-pay | Admitting: Neurology

## 2023-06-08 ENCOUNTER — Telehealth (INDEPENDENT_AMBULATORY_CARE_PROVIDER_SITE_OTHER): Payer: MEDICAID | Admitting: Neurology

## 2023-06-08 VITALS — Ht 63.0 in | Wt 178.0 lb

## 2023-06-08 DIAGNOSIS — R4182 Altered mental status, unspecified: Secondary | ICD-10-CM | POA: Diagnosis not present

## 2023-06-08 DIAGNOSIS — G40009 Localization-related (focal) (partial) idiopathic epilepsy and epileptic syndromes with seizures of localized onset, not intractable, without status epilepticus: Secondary | ICD-10-CM

## 2023-06-08 NOTE — Patient Instructions (Signed)
Continue Depakote 125mg  twice a day and Keppra 1000mg  twice a day.  Please call her PCP to evaluate for infection causing this sudden change in symptoms, including the flank pain and glucose control.  Follow-up in 3 months, call for any changes   Seizure Precautions: 1. If medication has been prescribed for you to prevent seizures, take it exactly as directed.  Do not stop taking the medicine without talking to your doctor first, even if you have not had a seizure in a long time.   2. Avoid activities in which a seizure would cause danger to yourself or to others.  Don't operate dangerous machinery, swim alone, or climb in high or dangerous places, such as on ladders, roofs, or girders.  Do not drive unless your doctor says you may.  3. If you have any warning that you may have a seizure, lay down in a safe place where you can't hurt yourself.    4.  No driving for 6 months from last seizure, as per [facility].   Please refer to the following link on the Epilepsy [employer] of [employer]'s website for more information: http://www.epilepsyfoundation.org/answerplace/Social/driving/drivingu.cfm   5.  Maintain good sleep hygiene.  6.  Contact your doctor if you have any problems that may be related to the medicine you are taking.  7.  Call 911 and bring the patient back to the ED if:        A.  The seizure lasts longer than 5 minutes.       B.  The patient doesn't awaken shortly after the seizure  C.  The patient has new problems such as difficulty seeing, speaking or moving  D.  The patient was injured during the seizure  E.  The patient has a temperature over 102 F (39C)  F.  The patient vomited and now is having trouble breathing

## 2023-06-08 NOTE — Progress Notes (Signed)
Virtual Visit via Video Note The purpose of this virtual visit is to provide medical care while limiting exposure to the novel coronavirus.    Consent was obtained for video visit:  Yes.   Answered questions that patient had about telehealth interaction:  Yes.    Pt location: Home Physician Location: office Name of referring provider:  Jeri [name], * I connected with [name] at patients initiation/request on 06/08/2023 at  1:30 PM EST by video enabled telemedicine application and verified that I am speaking with the correct person using two identifiers. Pt MRN:  [id number] Pt DOB:  01-12-1966 Video Participants:  [name];  [name] (sister)   History of Present Illness:  The patient had a virtual video visit on 06/08/2023. She was last seen in the neurology clinic 3 months ago for seizures. Her sister [name] provides the history. She has had episodes of staring/behavioral arrest, right facial twitching, and a GTC last 08/22/22. After this seizure, she had mutism with very flat affect/psychomotor slowing, ambulatory EEG did not show any electrographic seizures. She had another bout of seizures in 02/2023 again followed by mutism. Although there is a possibility this is psychiatric in nature, they do appear to occur after her seizures. On her last visit, Depakote 125mg  BID was restarted for mood stabilization and seizure prophylaxis, in addition to Levetiracetam 1000mg  BID. [name] denies any seizures since 02/2023, however there have been behavioral changes in the past 2-3 weeks. She would get up in the middle of the night and get dressed. Her nephew would see her coming from the bathroom and stand in the doorway, they tell her to go lie back down. She sits on the bed but stays awake for another 3 hours until her ride picks her up. She would come home at 5pm and go to sleep, which is unusual for her as well. [name] reports her timing is off. Her glucose levels have been  fluctuating as well, it was 37 this morning, [name] gave her some food and it went to the 90s. [name] has some drooling, able to answer simple questions but slow to respond. She reports feeling lightheaded. She reports pain on the right flank. No dysuria or foul-smelling urine that they can report. [name] states she gets all her medications, she has to crush them.    History on Initial Assessment 08/29/2022: This is a 57 year old right-handed woman with a history of mild intellectual disability, hypertension, diabetes, presenting for evaluation of seizures. Her sister [name] provides the history as patient is non-verbal in the office today. Records were also reviewed and will be summarized as follows. She had been living in a group home for 12 years up until 2021 when the group home closed and [place] took her in. She was in the hospital in May 2022 for seizures. At that time, [name] was unaware of a history of seizures (but states that she found out later on from another group home resident that she used to have seizures). She had been on Depakote for many years due to behavioral issues. She had stopped this along with several other medications in August 2021. Her sister started noticing intermittent staring/behavioral arrest episodes lasting a few seconds to 2 minutes. Per notes, "over the past day, she has not had much speech output at all." She had intermittent twitching of the right side of her face, then had a focal seizure witnessed by the ER physician. Overnight EEG in 10/2020 was normal. MRI  brain no acute changes. She was discharged on Levetiracetam 1000mg  BID. She was in the ER in August 2022 for decreased oral intake and "saying bizarre things." She was not on Levetiracetam on admission. MRI brain and EEG were normal. She was admitted for 2 months. She was seen by Psychiatry and diagnosed with catatonia, treated with Ativan with significant improvement in symptoms. After it was tapered and discontinued,  catatonia recurred and once again improved after she was placed on low dose schedule lorazepam 0.5mg  daily. Levetiracetam 1000mg  BID restarted. [name] reports that she did well for 2 years, at baseline she is verbal, eats three meals a day, makes her lunch, brushes her teeth and showers, reads. She is able to get her coat and pocketbook when [facility] comes to bring her to her day program at [facility]. On 08/20/22, she had a change in mental status, drooling, very flat affect, unresponsive to questions. She was noted to be stiff, and in the ER she had waxy flexibility (when extremities moved, they stayed in the position she was posed in). MRI brain no acute changes. She was given Ativan and Levetiracetam increased to 1250mg  BID. She was back in the ER on 08/22/22 after a witnessed tonic-clonic seizure lasting 20 seconds at home. When EMS evaluated her medications, Levetiracetam was not one of them, but that sister gave her 250mg  from recent ER visit. Last time 1000mg  tablet was filled was in March 2023. She had another seizure with right-sided facial twitching lasting less than 20 seconds. On ER discharge, she was responding quicker to questions, more interactive and smiling, feeding herself. [name] reports she was still non-verbal and has remained non-verbal since. She is eating and feeds herself but holds food in the mouth the past 2 nights. She used to put her own medications in a cup but now [name] has to use a straw and mixes her medications in pudding. Sleep is good. She can follow instructions and laughs at videos but otherwise has no other verbal output.   Update 03/13/23: On her initial visit in 08/2022, she had a very flat affect with psychomotor slowing, mutism, able to follow commands. We did a 28-hour ambulatory EEG in 08/2022 which was normal. Her sister reported that [name] was back to baseline. I had spoken to her psychiatry provider of 20 years, [name] with [facility] stating she had always  been on Depakote for mood and Risperdal, and that [place] had overall been very stable with no prior history of catatonia. Concern was raised about medication compliance living with her sister. She was in the ER on 02/26/23 for weakness. She had been at [facility] where she is normally bright and bubbly but that day she appeared weaker and reported she had not been using her insulin. Glucose with EMS was normal. She was treated for a UTI. [name] called our office on 03/01/23 that she was having memory loss and urinary incontinence, no longer able to function with ADLs again. EMS was called on 03/02/23 after a seizure lasting 10-15 minutes. In the ER, she was non-verbal (similar to prior ER visit in 08/2022). Brain MRI without contrast normal. Levetiracetam increased to 1250mg  BID. Overnight she improved, Psych evaluation on 9/14 indicated she was speaking in a clear tone with normal pace and good eye contact, thought process goal-oriented and linear.  [name] reports that she had a seizure on 9/9, she was staring then the left arm flexed and started jerking, she was bent over vocalizing drooling. She did  not fall. [name] reports she brought her to the ER that day, but ER notes only indicate weakness. [name] reports she was discharged home but she was not at baseline, she was not talking again similar to 08/2022 events. [name] reports she was having seizures through the night, "not here" with left arm jerking, so she brought her back to the ER on 9/13. She only started the Levetiracetam 1250mg  BID last weekend. [name] crushes her medications and monitors she takes them, denies missing any doses. She has had progressive improvement since ER visit. She presents today smiling, able to answer questions with note of dysarthria. [name] reports she is much better but still not back to her full self. Voice is shaky. She is better in the mornings, then has decreased verbal output in the evenings, where [name] helps with her pajamas  and moving her legs. Yesterday it took her over an hour to eat her oatmeal, she did well this morning. She needs help with her shirt and bra but can put on her socks and pants. [name] reports hospital said to stop the Risperdal so she has not been giving it for several days, however on review of ER AVS, there is no mention of stopping Risperdal.   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 She does jobs with her program. [name] denies any history of febrile seizures, CNS infections, neurosurgical procedures, family history of seizures.   Diagnostic Data: Brain MRI without contrast 08/2022 normal Brain MRI without contrast 02/2023 normal  Overnight EEG in 10/2020 was normal.  Routine EEG x 2 in 01/2021 normal Routine EEG 02/2021 normal Routine EEG 08/2022 normal 28-hour ambulatory EEG normal, patient was nonverbal the day prior but gradually improving during EEG, feeding herself    Current Outpatient Medications on File Prior to Visit  Medication Sig Dispense Refill   divalproex (DEPAKOTE SPRINKLE) 125 MG capsule Take 1 capsule twice a day 180 capsule 3   glimepiride (AMARYL) 2 MG tablet Take 2 mg by mouth daily before breakfast.     insulin aspart (NOVOLOG) 100 UNIT/ML injection Inject 1-7 Units into the skin 3 (three) times daily as needed for high blood sugar (Per sliding scale: 120-150=1 unit, 150-200=2 units, 201-250=4 units, 251-300=6 units).     insulin aspart (NOVOLOG) 100 UNIT/ML injection Inject 8 Units into the skin 3 (three) times daily with meals. (Patient taking differently: Inject 14 Units into the skin 3 (three) times daily with meals.) 10 mL 11   insulin glargine (LANTUS) 100 unit/mL SOPN Inject 30 Units into the skin at bedtime. (Patient taking differently: Inject 38 Units into the skin at bedtime.) 15 mL 11   levETIRAcetam (KEPPRA) 1000 MG tablet Take 1 tablet (1,000 mg  total) by mouth 2 (two) times daily. 180 tablet 3   loratadine (CLARITIN) 10 MG tablet Take 10 mg by mouth daily.     LORazepam (ATIVAN) 0.5 MG tablet Take 1 tablet (0.5 mg total) by mouth daily before breakfast. 30 tablet 0   metFORMIN (GLUCOPHAGE) 500 MG tablet Take 500 mg by mouth 2 (two) times daily with a meal.     metoprolol succinate (TOPROL-XL) 25 MG 24 hr tablet Take 0.5 tablets (12.5 mg total) by mouth daily. 30 tablet 2   risperiDONE (RISPERDAL) 2 MG tablet Take 1 tablet (2 mg total) by mouth at bedtime. 30 tablet 0   valACYclovir (VALTREX)  500 MG tablet Take 500 mg by mouth at bedtime.     No current facility-administered medications on file prior to visit.     Observations/Objective:   GEN:  The patient appears stated age and is in NAD. Flat affect, slight drooling  Neurological examination: Patient is awake, alert, decreased verbal output and interaction compared to prior visit. Able to answer simple questions. Cranial nerves: Extraocular movements intact . No facial asymmetry. Motor: moves all extremities symmetrically, at least anti-gravity x 4.    Assessment and Plan:   This is a 57 yo RH woman with a history of mild intellectual disability, hypertension, diabetes, with seizures. She has had episodes of staring/behavioral arrest, right facial twitching, and a GTC last 08/22/22. After this seizure, she had mutism with very flat affect/psychomotor slowing, ambulatory EEG did not show any electrographic seizures. She had another bout of seizures earlier this month again followed by mutism. Although there is a possibility this is psychiatric in nature, they do appear to occur after her seizures. She has not had typical seizures/mutism since 02/2023, continue Depakote 125mg  BID and Levetiracetam 1000mg  BID. There however is another change from baseline mental status, discussed checking for infection, she reports right flank pain. [name] will call her PCP for evaluation. Follow-up in 3  months, call for any changes.    Follow Up Instructions:   -I discussed the assessment and treatment plan with the patient's guardian. The patient's guardian was provided an opportunity to ask questions and all were answered. The patient's guardian agreed with the plan and demonstrated an understanding of the instructions.   The patient's guardian was advised to call back or seek an in-person evaluation if the symptoms worsen or if the condition fails to improve as anticipated.    Van Clines, MD

## 2023-09-17 ENCOUNTER — Ambulatory Visit: Payer: MEDICAID | Admitting: Neurology

## 2023-09-18 ENCOUNTER — Encounter: Payer: Self-pay | Admitting: Neurology

## 2023-09-25 ENCOUNTER — Ambulatory Visit: Payer: MEDICAID | Admitting: Neurology

## 2023-11-05 ENCOUNTER — Encounter: Payer: Self-pay | Admitting: Podiatry

## 2023-11-05 ENCOUNTER — Ambulatory Visit: Payer: MEDICAID | Admitting: Podiatry

## 2023-11-05 DIAGNOSIS — L600 Ingrowing nail: Secondary | ICD-10-CM | POA: Diagnosis not present

## 2023-11-05 DIAGNOSIS — M79671 Pain in right foot: Secondary | ICD-10-CM | POA: Diagnosis not present

## 2023-11-05 DIAGNOSIS — M79672 Pain in left foot: Secondary | ICD-10-CM

## 2023-11-05 DIAGNOSIS — B351 Tinea unguium: Secondary | ICD-10-CM | POA: Diagnosis not present

## 2023-11-05 NOTE — Progress Notes (Signed)
 Patient presents for evaluation and treatment of tenderness and some redness around nails feet.  Tenderness around toes with walking and wearing shoes.  Physical exam:  General appearance: Alert, pleasant, and in no acute distress.  Vascular: Pedal pulses: DP palpable bilaterally, PT nonpalpable bilaterally.  Moderate edema lower legs bilaterally  Neurological:  No paresthesias or burning noted  Dermatologic:  Nails thickened, disfigured, discolored 1-5 BL with subungual debris.  Redness and hypertrophic nail folds along nail folds bilaterally but no signs of drainage or infection.  Skin thin and atrophic with hyperpigmentation is noted.  No hair growth noted lower extremity  Musculoskeletal:    Diagnosis: 1.  Painful onychomycotic nails 1 through 5 bilaterally. 2. Pain toes 1 through 5 bilaterally. 3.  Diabetes mellitus type 2 with PVD 4.  Ingrown nails bilateral  Plan: Debrided onychomycotic nails 1 through 5 bilaterally.  Return 3 months

## 2024-01-22 ENCOUNTER — Other Ambulatory Visit: Payer: Self-pay | Admitting: Neurology

## 2024-02-05 ENCOUNTER — Ambulatory Visit: Payer: MEDICAID | Admitting: Podiatry

## 2024-02-19 ENCOUNTER — Ambulatory Visit: Payer: MEDICAID | Admitting: Podiatry

## 2024-03-10 ENCOUNTER — Ambulatory Visit (INDEPENDENT_AMBULATORY_CARE_PROVIDER_SITE_OTHER): Payer: MEDICAID | Admitting: Podiatry

## 2024-03-10 ENCOUNTER — Encounter: Payer: Self-pay | Admitting: Podiatry

## 2024-03-10 DIAGNOSIS — M79672 Pain in left foot: Secondary | ICD-10-CM

## 2024-03-10 DIAGNOSIS — M79671 Pain in right foot: Secondary | ICD-10-CM

## 2024-03-10 DIAGNOSIS — B351 Tinea unguium: Secondary | ICD-10-CM | POA: Diagnosis not present

## 2024-03-10 NOTE — Progress Notes (Signed)
 Patient presents for evaluation and treatment of tenderness and some redness around nails feet.  Tenderness around toes with walking and wearing shoes.  Physical exam:  General appearance: Alert, pleasant, and in no acute distress.  Vascular: Pedal pulses: DP 2/4 B/L, PT 0/4 B/L. Moderate edema lower legs bilaterally  Neu  Dermatologic:  Nails thickened, disfigured, discolored 1-5 BL with subungual debris.  Redness and hypertrophic nail folds along nail folds bilaterally but no signs of drainage or infection.  Musculoskeletal:     Diagnosis: 1. Painful onychomycotic nails 1 through 5 bilaterally. 2. Pain toes 1 through 5 bilaterally.  Plan: -Debrided onychomycotic nails 1 through 5 bilaterally.  Sharply debrided nails with nail clipper and reduced with a power bur.  Return 3 months [facility]

## 2024-07-10 ENCOUNTER — Other Ambulatory Visit: Payer: Self-pay | Admitting: Neurology
